# Patient Record
Sex: Female | Born: 1950
Health system: Southern US, Community
[De-identification: ages and names within clinical notes are randomized; demographics above are authoritative.]

## PROBLEM LIST (undated history)

## (undated) DIAGNOSIS — F419 Anxiety disorder, unspecified: Secondary | ICD-10-CM

## (undated) DIAGNOSIS — C4491 Basal cell carcinoma of skin, unspecified: Secondary | ICD-10-CM

## (undated) DIAGNOSIS — D649 Anemia, unspecified: Secondary | ICD-10-CM

## (undated) DIAGNOSIS — C801 Malignant (primary) neoplasm, unspecified: Secondary | ICD-10-CM

## (undated) DIAGNOSIS — J45909 Unspecified asthma, uncomplicated: Secondary | ICD-10-CM

## (undated) DIAGNOSIS — Z9289 Personal history of other medical treatment: Secondary | ICD-10-CM

## (undated) DIAGNOSIS — I719 Aortic aneurysm of unspecified site, without rupture: Secondary | ICD-10-CM

## (undated) HISTORY — DX: Unspecified asthma, uncomplicated: J45.909

## (undated) HISTORY — PX: OTHER SURGICAL HISTORY: SHX169

## (undated) HISTORY — DX: Basal cell carcinoma of skin, unspecified: C44.91

## (undated) HISTORY — DX: Anxiety disorder, unspecified: F41.9

## (undated) HISTORY — PX: TONSILLECTOMY: SUR1361

---

## 2000-09-05 HISTORY — PX: COLON SURGERY: SHX602

## 2001-09-05 HISTORY — PX: COLOSTOMY REVERSAL: SHX5782

## 2015-08-04 ENCOUNTER — Ambulatory Visit (INDEPENDENT_AMBULATORY_CARE_PROVIDER_SITE_OTHER): Payer: Self-pay | Admitting: Family Medicine

## 2015-08-04 ENCOUNTER — Encounter: Payer: Self-pay | Admitting: Family Medicine

## 2015-08-04 VITALS — BP 148/78 | HR 100 | Temp 98.2°F | Resp 18 | Ht 64.0 in | Wt 113.2 lb

## 2015-08-04 DIAGNOSIS — F4321 Adjustment disorder with depressed mood: Secondary | ICD-10-CM | POA: Insufficient documentation

## 2015-08-04 DIAGNOSIS — R002 Palpitations: Secondary | ICD-10-CM

## 2015-08-04 DIAGNOSIS — IMO0001 Reserved for inherently not codable concepts without codable children: Secondary | ICD-10-CM

## 2015-08-04 DIAGNOSIS — F4329 Adjustment disorder with other symptoms: Secondary | ICD-10-CM

## 2015-08-04 DIAGNOSIS — Z8249 Family history of ischemic heart disease and other diseases of the circulatory system: Secondary | ICD-10-CM

## 2015-08-04 DIAGNOSIS — F339 Major depressive disorder, recurrent, unspecified: Secondary | ICD-10-CM | POA: Insufficient documentation

## 2015-08-04 DIAGNOSIS — F321 Major depressive disorder, single episode, moderate: Secondary | ICD-10-CM

## 2015-08-04 DIAGNOSIS — I10 Essential (primary) hypertension: Secondary | ICD-10-CM | POA: Insufficient documentation

## 2015-08-04 DIAGNOSIS — G8929 Other chronic pain: Secondary | ICD-10-CM | POA: Insufficient documentation

## 2015-08-04 DIAGNOSIS — J4540 Moderate persistent asthma, uncomplicated: Secondary | ICD-10-CM | POA: Insufficient documentation

## 2015-08-04 DIAGNOSIS — F410 Panic disorder [episodic paroxysmal anxiety] without agoraphobia: Secondary | ICD-10-CM | POA: Insufficient documentation

## 2015-08-04 DIAGNOSIS — R030 Elevated blood-pressure reading, without diagnosis of hypertension: Secondary | ICD-10-CM | POA: Insufficient documentation

## 2015-08-04 DIAGNOSIS — M546 Pain in thoracic spine: Secondary | ICD-10-CM

## 2015-08-04 DIAGNOSIS — R5383 Other fatigue: Secondary | ICD-10-CM

## 2015-08-04 DIAGNOSIS — Z634 Disappearance and death of family member: Secondary | ICD-10-CM

## 2015-08-04 MED ORDER — CITALOPRAM HYDROBROMIDE 20 MG PO TABS: 20.0000 mg | ORAL_TABLET | Freq: Every day | ORAL | Status: DC

## 2015-08-04 MED ORDER — DIAZEPAM 5 MG PO TABS: 5.0000 mg | ORAL_TABLET | Freq: Two times a day (BID) | ORAL | Status: DC | PRN

## 2015-08-04 MED ORDER — FLUTICASONE FUROATE-VILANTEROL 100-25 MCG/INH IN AEPB: 1.0000 | INHALATION_SPRAY | Freq: Every day | RESPIRATORY_TRACT | Status: DC

## 2015-08-04 NOTE — Progress Notes (Signed)
Name: Patricia Galloway   MRN: [id number]    DOB: Feb 20, 1951   Date:08/04/2015       Progress Note  Subjective  Chief Complaint  Chief Complaint  Patient presents with  . 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 Her father died 5 years ago, mother died 34 months ago. She felt very tired right away with lack of motivation, but worse over the past 2 weeks. She had one very severe episode of palpitation, SOB, sensation of doom. A friend of her gave her a valium and symptoms resolved within 20 minutes. She was about to go to [facility]. She also has been waking up during the night, but able to fall back asleep. She has been napping more than usual. No lack of focus. She feels very fragile and is crying and being very emotional lately.   Chronic Mid back pain: sees chiropractor. Manipulation has improved her back pain. Described as an aching sensation on mid back. Pain at this time is zero/10.  Elevated Blood Pressure: she has not seen a pcp in over 10 years. She has a bp monitor at home and bp was very high during a panic attack - 200/100. However usually around 142/80's.    Asthma Moderate: she has a remote history of asthma, no symptoms in years, also smoked for about 25 years a half pack daily. She is self pay and cannot afford a spirometry or CXR done. She has noticed decreased in exercise tolerance, intermittent wheezing, almost daily, no cough.    Patient Active Problem List   Diagnosis Date Noted  . Chronic thoracic back pain 08/04/2015  . Panic attack 08/04/2015  . Complicated grieving [id number]  . Elevated blood pressure 08/04/2015  . Moderate major depression ([place]) 08/04/2015    Past Surgical History  Procedure Laterality Date  . Etopic  [id number]  . Colon surgery  2002    colostomy for 10 months after a perfurated sigmoid     Family History  Problem Relation Age  of Onset  . Dementia Mother   . Aortic stenosis Mother   . Dementia Father   . Diabetes Father   . Hyperlipidemia Father   . Hypertension Father   . Aneurysm Sister   . Hyperlipidemia Sister   . Hypertension Sister   . Diabetes Sister     Social History   Social History  . Marital Status: Single    Spouse Name: N/A  . Number of Children: N/A  . Years of Education: N/A   Occupational History  . Not on file.   Social History Main Topics  . Smoking status: Former Smoker -- 0.50 packs/day for 25 years    Types: Cigarettes    Quit date: 08/03/1997  . Smokeless tobacco: Not on file  . Alcohol Use: No  . Drug Use: No  . Sexual Activity: Not Currently    Birth Control/ Protection: Post-menopausal   Other Topics Concern  . Not on file   Social History Narrative  . No narrative on file     Current outpatient prescriptions:  .  Ascorbic Acid (VITAMIN C) 100 MG tablet, Take 100 mg by mouth daily., Disp: , Rfl:  .  co-enzyme Q-10 30 MG capsule, Take 30 mg by mouth 3 (three) times daily., Disp: , Rfl:  .  Multiple Vitamin (MULTIVITAMIN) capsule, Take 1 capsule by mouth daily.,  Disp: , Rfl:  .  vitamin E 100 UNIT capsule, Take by mouth daily., Disp: , Rfl:   Allergies  Allergen Reactions  . Codeine   . Penicillins      ROS  Constitutional: Negative for fever or weight change.  Respiratory: Negative for cough , but has  shortness of breath during a panic attack.   Cardiovascular: Positive  for chest pain or palpitations during panic attacks.  Gastrointestinal: Negative for abdominal pain, no bowel changes.  Musculoskeletal: Negative for gait problem or joint swelling.  Skin: Negative for rash.  Neurological: Negative for dizziness or headache.  No other specific complaints in a complete review of systems (except as listed in HPI above).  Objective  Filed Vitals:   08/04/15 0811  BP: 148/78  Pulse: 100  Temp: 98.2 F (36.8 C)  Resp: 18  Height: 5\' 4"  (1.626  m)  Weight: 113 lb 4 oz (51.37 kg)  SpO2: 97%    Body mass index is 19.43 kg/(m^2).  Physical Exam  Constitutional: Patient appears well-developed and well-nourished. No distress.  HENT: Head: Normocephalic and atraumatic. Ears: B TMs ok, no erythema or effusion; Nose: Nose normal. Mouth/Throat: Oropharynx is clear and moist. No oropharyngeal exudate.  Eyes: Conjunctivae and EOM are normal. Pupils are equal, round, and reactive to light. No scleral icterus.  Neck: Normal range of motion. Neck supple. No JVD present. No thyromegaly present.  Cardiovascular: Normal rate, regular rhythm and normal heart sounds.  No murmur heard. No BLE edema. Pulmonary/Chest: Effort normal , end expiratory wheezing. No respiratory distress. Abdominal: Soft. Bowel sounds are normal, no distension. There is no tenderness. no masses Musculoskeletal: Normal range of motion, no joint effusions. No gross deformities Neurological: he is alert and oriented to person, place, and time. No cranial nerve deficit. Coordination, balance, strength, speech and gait are normal.  Skin: Skin is warm and dry. No rash noted. No erythema.  Psychiatric: Patient is sad, crying. behavior is normal. Judgment and thought content normal.  PHQ2/9: Depression screen PHQ 2/9 08/04/2015  Decreased Interest 0  Down, Depressed, Hopeless 0  PHQ - 2 Score 0     Functional Status Survey: Is the patient deaf or have difficulty hearing?: No Does the patient have difficulty seeing, even when wearing glasses/contacts?: No Does the patient have difficulty concentrating, remembering, or making decisions?: No Does the patient have difficulty walking or climbing stairs?: No Does the patient have difficulty dressing or bathing?: No Does the patient have difficulty doing errands alone such as visiting a doctor's office or shopping?: No \   Assessment & Plan  1. Palpitation  Likely from panic attack, she just had a CBC and will bring me the  result - TSH  2. Chronic midline thoracic back pain  Continue follow up with Dr. Freddi Che  3. Complicated grieving  Discussed importance of contacting Hospice for grieving care.   4. Other fatigue  - Comprehensive metabolic panel - VITAMIN D 25 Hydroxy (Vit-D Deficiency, Fractures) - Vitamin B12  5. Family history of heart disease  - Lipid panel  6. Panic attack  - diazepam (VALIUM) 5 MG tablet; Take 1 tablet (5 mg total) by mouth every 12 (twelve) hours as needed for anxiety.  Dispense: 30 tablet; Refill: 0 - citalopram (CELEXA) 20 MG tablet; Take 1 tablet (20 mg total) by mouth daily.  Dispense: 30 tablet; Refill: 0  7. Elevated blood pressure  Monitor bp at home - and bring the results to her next visit  9. Asthma, moderate persistent, poorly-controlled  - Fluticasone Furoate-Vilanterol (BREO ELLIPTA) 100-25 MCG/INH AEPB; Inhale 1 puff into the lungs daily.  Dispense: 60 each; Refill: 0 We will try Breo and monitor, follow up in 2 weeks

## 2015-08-18 LAB — BASIC METABOLIC PANEL
BUN: 17 mg/dL (ref 4–21)
GLUCOSE: 88 mg/dL
POTASSIUM: 4.4 mmol/L (ref 3.4–5.3)
Sodium: 134 mmol/L — AB (ref 137–147)

## 2015-08-18 LAB — LIPID PANEL
Cholesterol: 163 mg/dL (ref 0–200)
HDL: 75 mg/dL — AB (ref 35–70)
LDL Cholesterol: 81 mg/dL
Triglycerides: 34 mg/dL — AB (ref 40–160)

## 2015-08-18 LAB — TSH: TSH: 2.91 u[IU]/mL (ref 0.41–5.90)

## 2015-08-18 LAB — HEMOGLOBIN A1C: HEMOGLOBIN A1C: 5.9

## 2015-08-25 ENCOUNTER — Ambulatory Visit: Payer: Self-pay | Admitting: Family Medicine

## 2015-08-25 ENCOUNTER — Encounter: Payer: Self-pay | Admitting: Family Medicine

## 2015-08-25 ENCOUNTER — Ambulatory Visit (INDEPENDENT_AMBULATORY_CARE_PROVIDER_SITE_OTHER): Payer: Self-pay | Admitting: Family Medicine

## 2015-08-25 VITALS — BP 148/96 | HR 106 | Temp 98.2°F | Resp 18 | Ht 64.0 in | Wt 117.3 lb

## 2015-08-25 DIAGNOSIS — R2240 Localized swelling, mass and lump, unspecified lower limb: Secondary | ICD-10-CM | POA: Insufficient documentation

## 2015-08-25 DIAGNOSIS — R2241 Localized swelling, mass and lump, right lower limb: Secondary | ICD-10-CM

## 2015-08-25 DIAGNOSIS — J4540 Moderate persistent asthma, uncomplicated: Secondary | ICD-10-CM

## 2015-08-25 DIAGNOSIS — D509 Iron deficiency anemia, unspecified: Secondary | ICD-10-CM

## 2015-08-25 DIAGNOSIS — F321 Major depressive disorder, single episode, moderate: Secondary | ICD-10-CM

## 2015-08-25 DIAGNOSIS — F410 Panic disorder [episodic paroxysmal anxiety] without agoraphobia: Secondary | ICD-10-CM

## 2015-08-25 MED ORDER — SERTRALINE HCL 50 MG PO TABS: 25.0000 mg | ORAL_TABLET | Freq: Every day | ORAL | Status: DC

## 2015-08-25 MED ORDER — FERROUS GLUCONATE 324 (38 FE) MG PO TABS: 324.0000 mg | ORAL_TABLET | Freq: Three times a day (TID) | ORAL | Status: DC

## 2015-08-25 MED ORDER — FLUTICASONE FUROATE-VILANTEROL 100-25 MCG/INH IN AEPB: 1.0000 | INHALATION_SPRAY | Freq: Every day | RESPIRATORY_TRACT | Status: DC

## 2015-08-25 MED ORDER — DIAZEPAM 5 MG PO TABS: 5.0000 mg | ORAL_TABLET | Freq: Two times a day (BID) | ORAL | Status: DC | PRN

## 2015-08-25 MED ORDER — SILVER SULFADIAZINE 1 % EX CREA: 1.0000 "application " | TOPICAL_CREAM | Freq: Every day | CUTANEOUS | Status: DC

## 2015-08-25 NOTE — Progress Notes (Signed)
Name: Patricia Galloway   MRN: [id number]    DOB: [birth year]   Date:08/25/2015       Progress Note  Subjective  Chief Complaint  Chief Complaint  Patient presents with  . Medication Refill    3 week F/U  . Panic Attacks    Has had less attacks with Valium, only took 1 of her Celexa-had bad side effects such as making her feel heavy and very drugged  . Asthma    Breo has improved breathing quality    HPI   Asthma: she is feeling better since she started taking Breo, still has SOB, but no longer has wheezing, cough also very seldom now.   Panic Attacks and Depression: unable to tolerate Citalopram , made her feel groggy. She states Valium is working well, no recent panic attacks, able to take half twice daily, she has also been able to sleep well since started on medication  Elevated blood pressure: bp elevated again, and she likely has hypertension, however wants to hold off on medication, she was really nervous about discussing a mass that has been on her leg for 8 years .  Anemia: she was able to have labs done and was found to have severe anemia - hgb down from 6 a few weeks ago to 5.4. She is tired and SOB but states present for years. She denies chest pain or syncopal episodes. She denies blood in stools, she states mass on her legs bleeds frequently and sometimes in large amounts  Leg mass: present for the past 8 years, initially a quarter size area. She has kept the area hidden from even her husband for all this time. Very ashamed of it, but did not want to discuss it since she does not have medical insurance. She keep the area clean, different products that she uses about twice daily and may include peroxide, witch hazel or tea tree oil. The mass has grown over the years, and it has a strong odor.    Patient Active Problem List   Diagnosis Date Noted  . Iron deficiency anemia 08/25/2015  . Mass of leg 08/25/2015  . Chronic thoracic back pain 08/04/2015  . Panic attack  08/04/2015  . Complicated grieving [id number]  . Elevated blood pressure 08/04/2015  . Moderate major depression ([place]) 08/04/2015  . Asthma, moderate persistent, poorly-controlled 08/04/2015    Past Surgical History  Procedure Laterality Date  . Etopic  [id number]  . Colon surgery  2002    colostomy for 10 months after a perfurated sigmoid     Family History  Problem Relation Age of Onset  . Dementia Mother   . Aortic stenosis Mother   . Osteoporosis Mother   . Dementia Father   . Diabetes Father   . Hyperlipidemia Father   . Hypertension Father   . Aneurysm Sister   . Hyperlipidemia Sister   . Hypertension Sister   . Diabetes Sister     Social History   Social History  . Marital Status: Single    Spouse Name: N/A  . Number of Children: N/A  . Years of Education: N/A   Occupational History  . Not on file.   Social History Main Topics  . Smoking status: Former Smoker -- 0.50 packs/day for 25 years    Types: Cigarettes    Quit date: 08/03/1997  . Smokeless tobacco: Not on file  . Alcohol Use: No  . Drug Use: No  . Sexual Activity: Not Currently    Birth Control/  Protection: Post-menopausal   Other Topics Concern  . Not on file   Social History Narrative     Current outpatient prescriptions:  .  Ascorbic Acid (VITAMIN C) 100 MG tablet, Take 100 mg by mouth daily., Disp: , Rfl:  .  co-enzyme Q-10 30 MG capsule, Take 30 mg by mouth 3 (three) times daily., Disp: , Rfl:  .  diazepam (VALIUM) 5 MG tablet, Take 1 tablet (5 mg total) by mouth every 12 (twelve) hours as needed for anxiety., Disp: 30 tablet, Rfl: 2 .  Fluticasone Furoate-Vilanterol (BREO ELLIPTA) 100-25 MCG/INH AEPB, Inhale 1 puff into the lungs daily., Disp: 60 each, Rfl: 2 .  Multiple Vitamin (MULTIVITAMIN) capsule, Take 1 capsule by mouth daily., Disp: , Rfl:  .  vitamin E 100 UNIT capsule, Take by mouth daily., Disp: , Rfl:  .  sertraline (ZOLOFT) 50 MG tablet, Take 0.5-1 tablets (25-50 mg  total) by mouth daily., Disp: 30 tablet, Rfl: 3  Allergies  Allergen Reactions  . Codeine   . Penicillins      ROS  Constitutional: Negative for fever or weight change.  Respiratory: positive  for cough and shortness of breath.   Cardiovascular: Negative for chest pain or palpitations.  Gastrointestinal: Negative for abdominal pain, no bowel changes.  Musculoskeletal: Negative for gait problem or joint swelling.  Skin: large mass on right inner thigh  Neurological: Negative for dizziness or headache.  No other specific complaints in a complete review of systems (except as listed in HPI above).  Objective  Filed Vitals:   08/25/15 1334  BP: 148/96  Pulse: 106  Temp: 98.2 F (36.8 C)  TempSrc: Oral  Resp: 18  Height: 5\' 4"  (1.626 m)  Weight: 117 lb 4.8 oz (53.207 kg)  SpO2: 96%    Body mass index is 20.12 kg/(m^2).  Physical Exam  Constitutional: Patient appears well-developed and well-nourished.  No distress.  HEENT: head atraumatic, normocephalic, pupils equal and reactive to light, neck supple, throat within normal limits Cardiovascular: Normal rate, regular rhythm and normal heart sounds.  No murmur heard. No BLE edema. Pulmonary/Chest: Effort normal and breath sounds normal. No respiratory distress. Abdominal: Soft.  There is no tenderness. Psychiatric: Patient has a normal mood and affect. behavior is normal. Judgment and thought content normal. Skin: large mass on right inner thigh , with strong odor, friable, no active bleeding, spoke to Dr. Fleet Contras - general surgeon and he contacted social worker we will try to arrange for therapy  Recent Results (from the past 2160 hour(s))  Basic metabolic panel     Status: Abnormal   Collection Time: 08/18/15 12:00 AM  Result Value Ref Range   Glucose 88 mg/dL   BUN 17 4 - 21 mg/dL   Potassium 4.4 3.4 - 5.3 mmol/L   Sodium 134 (A) 137 - 147 mmol/L  Lipid panel     Status: Abnormal   Collection Time: 08/18/15 12:00 AM   Result Value Ref Range   Triglycerides 34 (A) 40 - 160 mg/dL   Cholesterol 163 0 - 200 mg/dL   HDL 75 (A) 35 - 70 mg/dL   LDL Cholesterol 81 mg/dL  Hemoglobin A1c     Status: None   Collection Time: 08/18/15 12:00 AM  Result Value Ref Range   Hemoglobin A1C 5.9   TSH     Status: None   Collection Time: 08/18/15 12:00 AM  Result Value Ref Range   TSH 2.91 0.41 - 5.90 uIU/mL  PHQ2/9: Depression screen PHQ 2/9 08/04/2015  Decreased Interest 0  Down, Depressed, Hopeless 0  PHQ - 2 Score 0     Assessment & Plan  1. Iron deficiency anemia  - ferrous gluconate (FERGON) 324 MG tablet; Take 1 tablet (324 mg total) by mouth 3 (three) times daily with meals.  Dispense: 90 tablet; Refill: 0 Very low hgb level - started on otc ferrous supplementation  Likely chronic, she was not in distress but has tachycardia, discussed sending her to [facility] for blood transfusion, but she is worried about the cost, we will start supplements, advised to go to [facility] if no improvement. Dr. Fleet Contras contacted Brayton Layman at [facility] - social worker to see how we can help the patient.   2. Mass of leg, right  present for the past 8 years, needs to have it surgically removed - likely skin cancer - but she does not have insurance. Dr. Fleet Contras was kind to come to [facility] and look at the lesion and discuss treatment with patient. We are waiting on social worker to contact patient and tell us how to proceed from here.  - silver sulfADIAZINE (SILVADENE) 1 % cream; Apply 1 application topically daily.  Dispense: 50 g; Refill: 2  3. Asthma, moderate persistent, poorly-controlled  - Fluticasone Furoate-Vilanterol (BREO ELLIPTA) 100-25 MCG/INH AEPB; Inhale 1 puff into the lungs daily.  Dispense: 60 each; Refill: 2  4. Panic attack  - diazepam (VALIUM) 5 MG tablet; Take 1 tablet (5 mg total) by mouth every 12 (twelve) hours as needed for anxiety.  Dispense: 30 tablet; Refill: 2  5. Moderate major depression ([place])  She  could not tolerate Celexa, we will try Zoloft , start low and titrate, she will call back in one month, and if she is doing well she will follow up in 3 months - since she does not have insurance at this time - sertraline (ZOLOFT) 50 MG tablet; Take 0.5-1 tablets (25-50 mg total) by mouth daily.  Dispense: 30 tablet; Refill: 3

## 2015-08-26 ENCOUNTER — Ambulatory Visit: Payer: Self-pay | Admitting: Family Medicine

## 2015-08-27 ENCOUNTER — Encounter: Payer: Self-pay | Admitting: Family Medicine

## 2015-09-02 ENCOUNTER — Telehealth: Payer: Self-pay

## 2015-09-02 NOTE — Telephone Encounter (Signed)
Called patient and left voicemail to see how she was doing and if social services had contacted her yet, from her appointment on 12/20.

## 2015-09-02 NOTE — Telephone Encounter (Signed)
Patient called back states Social Services did called and asked her several questions and told her they would give her a call back. She has not heard back from them as of yet. She states she is taking several supplements for her anemia and over the past couple of days and has made her feel good and have more energy.

## 2015-09-02 NOTE — Telephone Encounter (Signed)
I am really concerned about her anemia, please advise her to go to [facility] if Education officer, museum can't give hear time frame about her benefits. Thank you

## 2015-09-03 NOTE — Telephone Encounter (Signed)
Dr. Ancil Boozer spoke with patient today on 09/03/2015 at 11:00 a.m. To express her concerns about her anemia and to follow back with Social Services for about getting help.

## 2015-09-22 ENCOUNTER — Telehealth: Payer: Self-pay

## 2015-09-22 ENCOUNTER — Ambulatory Visit: Payer: Self-pay | Admitting: General Surgery

## 2015-09-22 DIAGNOSIS — D509 Iron deficiency anemia, unspecified: Secondary | ICD-10-CM

## 2015-09-22 NOTE — Telephone Encounter (Signed)
Patient was informed that she will have to wait 6 weeks (11/03/15) to see the difference in her ferritin/ iron levels. She agreed and said thank you so much.

## 2015-09-22 NOTE — Telephone Encounter (Signed)
She needs to wait at least 6 weeks since last checked

## 2015-09-22 NOTE — Telephone Encounter (Signed)
Patient wants an order for labs regarding iron levels as mentioned during office visit.   Please call when ready.

## 2015-09-25 ENCOUNTER — Ambulatory Visit: Payer: Self-pay | Admitting: Family Medicine

## 2015-10-16 ENCOUNTER — Ambulatory Visit: Payer: Self-pay | Admitting: Family Medicine

## 2015-10-22 ENCOUNTER — Telehealth: Payer: Self-pay | Admitting: *Deleted

## 2015-10-22 NOTE — Telephone Encounter (Signed)
Message left for patient to call the office.

## 2015-10-22 NOTE — Telephone Encounter (Signed)
-----   Message from Robert Bellow, MD sent at 10/22/2015  9:34 AM EST ----- Please contact the patient and see if she is willing to have a biopsy completed on the right leg mass.

## 2015-10-26 ENCOUNTER — Other Ambulatory Visit: Payer: Self-pay | Admitting: Family Medicine

## 2015-10-26 NOTE — Telephone Encounter (Signed)
Another message left for patient to call the office.

## 2015-10-29 ENCOUNTER — Encounter: Payer: Self-pay | Admitting: *Deleted

## 2015-10-29 NOTE — Telephone Encounter (Signed)
My Chart message sent to the patient today

## 2015-11-02 ENCOUNTER — Telehealth: Payer: Self-pay | Admitting: Family Medicine

## 2015-11-02 NOTE — Telephone Encounter (Signed)
Patient willing to come in for excision but she wishes to postpone until April 2017.  An appointment has been arranged per patient's wishes.

## 2015-11-02 NOTE — Telephone Encounter (Signed)
Have appointment with Dr Ancil Boozer on next week and is requesting a lab order.

## 2015-11-02 NOTE — Telephone Encounter (Signed)
Please reprint orders from January

## 2015-11-05 ENCOUNTER — Other Ambulatory Visit: Payer: Self-pay | Admitting: Family Medicine

## 2015-11-06 LAB — CBC WITH DIFFERENTIAL/PLATELET
BASOS ABS: 0.1 10*3/uL (ref 0.0–0.2)
BASOS: 1 %
EOS (ABSOLUTE): 0.4 10*3/uL (ref 0.0–0.4)
EOS: 4 %
Hematocrit: 38.3 % (ref 34.0–46.6)
Hemoglobin: 13 g/dL (ref 11.1–15.9)
IMMATURE GRANS (ABS): 0 10*3/uL (ref 0.0–0.1)
Immature Granulocytes: 0 %
LYMPHS: 24 %
Lymphocytes Absolute: 2.3 10*3/uL (ref 0.7–3.1)
MCH: 26.5 pg — AB (ref 26.6–33.0)
MCHC: 33.9 g/dL (ref 31.5–35.7)
MCV: 78 fL — ABNORMAL LOW (ref 79–97)
MONOCYTES: 10 %
Monocytes Absolute: 1 10*3/uL — ABNORMAL HIGH (ref 0.1–0.9)
NEUTROS ABS: 6 10*3/uL (ref 1.4–7.0)
NEUTROS PCT: 61 %
PLATELETS: 369 10*3/uL (ref 150–379)
RBC: 4.9 x10E6/uL (ref 3.77–5.28)
RDW: 20.2 % — ABNORMAL HIGH (ref 12.3–15.4)
WBC: 9.7 10*3/uL (ref 3.4–10.8)

## 2015-11-06 LAB — IRON AND TIBC
IRON SATURATION: 37 % (ref 15–55)
Iron: 112 ug/dL (ref 27–139)
TIBC: 305 ug/dL (ref 250–450)
UIBC: 193 ug/dL (ref 118–369)

## 2015-11-06 LAB — FERRITIN: Ferritin: 24 ng/mL (ref 15–150)

## 2015-11-10 ENCOUNTER — Encounter: Payer: Self-pay | Admitting: Family Medicine

## 2015-11-10 ENCOUNTER — Ambulatory Visit (INDEPENDENT_AMBULATORY_CARE_PROVIDER_SITE_OTHER): Payer: Self-pay | Admitting: Family Medicine

## 2015-11-10 VITALS — BP 142/82 | HR 104 | Temp 99.1°F | Resp 16 | Wt 114.4 lb

## 2015-11-10 DIAGNOSIS — F4321 Adjustment disorder with depressed mood: Secondary | ICD-10-CM

## 2015-11-10 DIAGNOSIS — D509 Iron deficiency anemia, unspecified: Secondary | ICD-10-CM

## 2015-11-10 DIAGNOSIS — Z634 Disappearance and death of family member: Secondary | ICD-10-CM

## 2015-11-10 DIAGNOSIS — R2241 Localized swelling, mass and lump, right lower limb: Secondary | ICD-10-CM

## 2015-11-10 DIAGNOSIS — F4329 Adjustment disorder with other symptoms: Secondary | ICD-10-CM

## 2015-11-10 DIAGNOSIS — J4540 Moderate persistent asthma, uncomplicated: Secondary | ICD-10-CM

## 2015-11-10 DIAGNOSIS — F321 Major depressive disorder, single episode, moderate: Secondary | ICD-10-CM

## 2015-11-10 MED ORDER — ALBUTEROL SULFATE HFA 108 (90 BASE) MCG/ACT IN AERS: 2.0000 | INHALATION_SPRAY | Freq: Four times a day (QID) | RESPIRATORY_TRACT | Status: DC | PRN

## 2015-11-10 NOTE — Progress Notes (Signed)
Name: Patricia Galloway   MRN: [id number]    DOB: 1951-04-14   Date:11/10/2015       Progress Note  Subjective  Chief Complaint  Chief Complaint  Patient presents with  . Anemia    patient is here for her 1-month f/u    HPI  Asthma: she is feeling better since she started taking Breo. She has been paying a lot of money for it, we will try to get her samples until she gets insurance ( Medicare ) in Nov 2017  Panic Attacks and Depression: unable to tolerate Citalopram , made her feel groggy, she never started Zoloft. She states Valium is working well, no recent panic attacks, able to take half tablet prn only, about half pill once a week, she has also been able to sleep well since started on medication.  HTN: bp elevated again, but she does not want to take medication for hypertension. BP is not dangerously high so we will monitor.   Anemia: she was able to have labs done and was found to have severe anemia - hgb down from 6 a few weeks ago to 5.4. She is feeling better, less fatigue, but still her moments. Hbg level is back to normal, she is taking iron pills and increased iron rich diet.   Leg mass: present for the past 8 years, initially a quarter size area. She has kept the area hidden from even her husband for all this time. Very ashamed of it, but did not want to discuss it since she does not have medical insurance. She keep the area clean, different products that she uses about twice daily and may include peroxide, witch hazel or tea tree oil. The mass has grown over the years, and it has a strong odor. She tried silvadene but it caused increase in irritation of her skin and bleeding so she stopped after 5 weeks and is doing better now. She will have Medicare this fall and will wait to have biopsy done at that time.    Patient Active Problem List   Diagnosis Date Noted  . Iron deficiency anemia 08/25/2015  . Mass of leg 08/25/2015  . Chronic thoracic back pain 08/04/2015  . Panic  attack 08/04/2015  . Complicated grieving [id number]  . Elevated blood pressure 08/04/2015  . Moderate major depression ([place]) 08/04/2015  . Asthma, moderate persistent, poorly-controlled 08/04/2015    Past Surgical History  Procedure Laterality Date  . Etopic  [id number]  . Colon surgery  2002    colostomy for 10 months after a perfurated sigmoid     Family History  Problem Relation Age of Onset  . Dementia Mother   . Aortic stenosis Mother   . Osteoporosis Mother   . Dementia Father   . Diabetes Father   . Hyperlipidemia Father   . Hypertension Father   . Aneurysm Sister   . Hyperlipidemia Sister   . Hypertension Sister   . Diabetes Sister     Social History   Social History  . Marital Status: Single    Spouse Name: N/A  . Number of Children: N/A  . Years of Education: N/A   Occupational History  . Not on file.   Social History Main Topics  . Smoking status: Former Smoker -- 0.50 packs/day for 25 years    Types: Cigarettes    Quit date: 08/03/1997  . Smokeless tobacco: Not on file  . Alcohol Use: No  . Drug Use: No  . Sexual Activity: Not Currently  Birth Control/ Protection: Post-menopausal   Other Topics Concern  . Not on file   Social History Narrative     Current outpatient prescriptions:  .  Ascorbic Acid (VITAMIN C) 100 MG tablet, Take 100 mg by mouth daily., Disp: , Rfl:  .  Cholecalciferol (VITAMIN D3) 1000 units CAPS, Take by mouth., Disp: , Rfl:  .  co-enzyme Q-10 30 MG capsule, Take 30 mg by mouth 3 (three) times daily., Disp: , Rfl:  .  diazepam (VALIUM) 5 MG tablet, TAKE ONE TABLET EVERY 12 HOURS IF NEEDED FOR ANXIETY, Disp: 30 tablet, Rfl: 0 .  ferrous gluconate (FERGON) 324 MG tablet, Take 1 tablet (324 mg total) by mouth 3 (three) times daily with meals., Disp: 90 tablet, Rfl: 0 .  Fluticasone Furoate-Vilanterol (BREO ELLIPTA) 100-25 MCG/INH AEPB, Inhale 1 puff into the lungs daily., Disp: 60 each, Rfl: 2 .  Multiple Vitamin  (MULTIVITAMIN) capsule, Take 1 capsule by mouth daily., Disp: , Rfl:  .  vitamin E 100 UNIT capsule, Take by mouth daily., Disp: , Rfl:   Allergies  Allergen Reactions  . Codeine   . Penicillins   . Sulfur Other (See Comments)     Patient stated redness, discomfort, and made skin bleed     ROS  Constitutional: Negative for fever , mild  weight change.  Respiratory: positive for cough and shortness of breath - better with Breo.  Cardiovascular: Negative for chest pain or palpitations.  Gastrointestinal: Negative for abdominal pain, no bowel changes.  Musculoskeletal: Negative for gait problem or joint swelling.  Skin: large mass on right inner thigh  Neurological: Negative for dizziness or headache.  No other specific complaints in a complete review of systems (except as listed in HPI above).  Objective  Filed Vitals:   11/10/15 1339  BP: 142/82  Pulse: 104  Temp: 99.1 F (37.3 C)  TempSrc: Oral  Resp: 16  Weight: 114 lb 6.4 oz (51.891 kg)  SpO2: 96%    Body mass index is 19.63 kg/(m^2).  Physical Exam   Constitutional: Patient appears well-developed and well-nourished. No distress.  HEENT: head atraumatic, normocephalic, pupils equal and reactive to light, neck supple, throat within normal limits Cardiovascular: Normal rate, regular rhythm and normal heart sounds. No murmur heard. No BLE edema. Pulmonary/Chest: Effort normal and breath sounds normal. No respiratory distress. Abdominal: Soft. There is no tenderness. Psychiatric: Patient has a normal mood and affect. behavior is normal. Judgment and thought content normal. Skin:not examined today   Recent Results (from the past 2160 hour(s))  Basic metabolic panel     Status: Abnormal   Collection Time: 08/18/15 12:00 AM  Result Value Ref Range   Glucose 88 mg/dL   BUN 17 4 - 21 mg/dL   Potassium 4.4 3.4 - 5.3 mmol/L   Sodium 134 (A) 137 - 147 mmol/L  Lipid panel     Status: Abnormal   Collection Time:  08/18/15 12:00 AM  Result Value Ref Range   Triglycerides 34 (A) 40 - 160 mg/dL   Cholesterol 163 0 - 200 mg/dL   HDL 75 (A) 35 - 70 mg/dL   LDL Cholesterol 81 mg/dL  Hemoglobin A1c     Status: None   Collection Time: 08/18/15 12:00 AM  Result Value Ref Range   Hemoglobin A1C 5.9   TSH     Status: None   Collection Time: 08/18/15 12:00 AM  Result Value Ref Range   TSH 2.91 0.41 - 5.90 uIU/mL  CBC with  Differential/Platelet     Status: Abnormal   Collection Time: 11/05/15  9:23 AM  Result Value Ref Range   WBC 9.7 3.4 - 10.8 x10E3/uL   RBC 4.90 3.77 - 5.28 x10E6/uL   Hemoglobin 13.0 11.1 - 15.9 g/dL   Hematocrit 38.3 34.0 - 46.6 %   MCV 78 (L) 79 - 97 fL   MCH 26.5 (L) 26.6 - 33.0 pg   MCHC 33.9 31.5 - 35.7 g/dL   RDW 20.2 (H) 12.3 - 15.4 %   Platelets 369 150 - 379 x10E3/uL   Neutrophils 61 %   Lymphs 24 %   Monocytes 10 %   Eos 4 %   Basos 1 %   Neutrophils Absolute 6.0 1.4 - 7.0 x10E3/uL   Lymphocytes Absolute 2.3 0.7 - 3.1 x10E3/uL   Monocytes Absolute 1.0 (H) 0.1 - 0.9 x10E3/uL   EOS (ABSOLUTE) 0.4 0.0 - 0.4 x10E3/uL   Basophils Absolute 0.1 0.0 - 0.2 x10E3/uL   Immature Granulocytes 0 %   Immature Grans (Abs) 0.0 0.0 - 0.1 x10E3/uL  Iron and TIBC     Status: None   Collection Time: 11/05/15  9:23 AM  Result Value Ref Range   Total Iron Binding Capacity 305 250 - 450 ug/dL   UIBC 193 118 - 369 ug/dL   Iron 112 27 - 139 ug/dL   Iron Saturation 37 15 - 55 %  Ferritin     Status: None   Collection Time: 11/05/15  9:23 AM  Result Value Ref Range   Ferritin 24 15 - 150 ng/mL     PHQ2/9: Depression screen [facility] 2/9 11/10/2015 08/04/2015  Decreased Interest 0 0  Down, Depressed, Hopeless 0 0  PHQ - 2 Score 0 0    Fall Risk: Fall Risk  11/10/2015  Falls in the past year? No     Functional Status Survey: Is the patient deaf or have difficulty hearing?: No Does the patient have difficulty seeing, even when wearing glasses/contacts?: No Does the patient have  difficulty concentrating, remembering, or making decisions?: No Does the patient have difficulty walking or climbing stairs?: No Does the patient have difficulty dressing or bathing?: No Does the patient have difficulty doing errands alone such as visiting a doctor's office or shopping?: No    Assessment & Plan  1. Asthma, moderate persistent, poorly-controlled  We will try Breo Elipta samples until she gets insurance coverage this Fall - albuterol (PROVENTIL HFA;VENTOLIN HFA) 108 (90 Base) MCG/ACT inhaler; Inhale 2 puffs into the lungs every 6 (six) hours as needed for wheezing or shortness of breath.  Dispense: 1 Inhaler; Refill: 1  2. Mass of leg, right  She will hold off on seeing Dr. Fleet Contras for now. Waiting on insurance coverage  3. Iron deficiency anemia  Doing great, continue high iron rich diet  4. Moderate major depression ([facility])  Doing much better now, never filled Zoloft  5. Complicated grieving  Had counseling with hospice grieving group and is doing well now

## 2015-12-10 ENCOUNTER — Ambulatory Visit: Payer: Self-pay | Admitting: General Surgery

## 2016-02-23 ENCOUNTER — Ambulatory Visit: Payer: Self-pay | Admitting: General Surgery

## 2016-03-01 ENCOUNTER — Encounter: Payer: Self-pay | Admitting: *Deleted

## 2016-03-09 ENCOUNTER — Ambulatory Visit (INDEPENDENT_AMBULATORY_CARE_PROVIDER_SITE_OTHER): Payer: Self-pay | Admitting: Family Medicine

## 2016-03-09 ENCOUNTER — Encounter: Payer: Self-pay | Admitting: Family Medicine

## 2016-03-09 VITALS — BP 174/88 | HR 118 | Temp 98.3°F | Resp 20 | Ht 64.0 in | Wt 110.7 lb

## 2016-03-09 DIAGNOSIS — F410 Panic disorder [episodic paroxysmal anxiety] without agoraphobia: Secondary | ICD-10-CM

## 2016-03-09 DIAGNOSIS — IMO0001 Reserved for inherently not codable concepts without codable children: Secondary | ICD-10-CM

## 2016-03-09 DIAGNOSIS — D509 Iron deficiency anemia, unspecified: Secondary | ICD-10-CM

## 2016-03-09 DIAGNOSIS — R2241 Localized swelling, mass and lump, right lower limb: Secondary | ICD-10-CM

## 2016-03-09 DIAGNOSIS — F321 Major depressive disorder, single episode, moderate: Secondary | ICD-10-CM

## 2016-03-09 DIAGNOSIS — J4540 Moderate persistent asthma, uncomplicated: Secondary | ICD-10-CM

## 2016-03-09 DIAGNOSIS — R030 Elevated blood-pressure reading, without diagnosis of hypertension: Secondary | ICD-10-CM

## 2016-03-09 MED ORDER — DIAZEPAM 5 MG PO TABS: 5.0000 mg | ORAL_TABLET | Freq: Two times a day (BID) | ORAL | Status: DC | PRN

## 2016-03-09 NOTE — Progress Notes (Signed)
Name: Patricia Galloway   MRN: [id number]    DOB: Oct 19, 1950   Date:03/09/2016       Progress Note  Subjective  Chief Complaint  Chief Complaint  Patient presents with  . Follow-up    patient had several health maintenance items to be addressed  . Asthma    patient stated that the [facility] has worked, but it is unaffordable. needs more samples.  . Anemia  . Depression    patient had been ok  . Mass  . Fatigue    patient stated that she has had increased episodes of fatigue    HPI  Asthma: she is feeling better since she started taking Breo. She has been paying a lot of money for it, we will try to get her samples until she gets insurance ( Medicare ) in Nov 2017, she states she has a lot of wheezing and SOB when off medication for 3 days.   Panic Attacks and Depression: unable to tolerate Citalopram , made her feel groggy, she never started Zoloft. She states Valium is working well, no recent panic attacks, able to take half tablet prn only, about half pill once a week, she has also been able to sleep well since started on medication.   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 Her father died 5 years ago, mother died in 2014-12-22. She is doing better, more motivation now.  Not able to tolerate SSRI.   HTN: bp elevated again, but she does not want to take medication for hypertension. Explained risk, she thinks it is white coat   Anemia: she was able to have labs done and was found to have severe anemia - hgb down from 6 a few weeks ago to 5.4, but improved with supplements, she states leg mass has been bleeding again and will resume her supplements. She is feeling very tired again when she gets up in am's  Leg mass: present for the past 9 years, initially a quarter size area. She has kept the area hidden from even her husband for all this time. Very ashamed of it, but did not want to discuss it since she does not have medical  insurance. She keep the area clean, different products that she uses about twice daily and may include peroxide, witch hazel or tea tree oil. The mass has grown over the years, and it has a strong odor. She tried silvadene but it caused increase in irritation of her skin and bleeding so she stopped after 5 weeks and is doing better now. She will have Medicare this fall and will wait to have biopsy done at that time. She was seen by Dr. Fleet Contras, concerned about cancer  Patient Active Problem List   Diagnosis Date Noted  . Iron deficiency anemia 08/25/2015  . Mass of leg 08/25/2015  . Chronic thoracic back pain 08/04/2015  . Panic attack 08/04/2015  . Complicated grieving [id number]  . Elevated blood pressure 08/04/2015  . Moderate major depression ([place]) 08/04/2015  . Asthma, moderate persistent, poorly-controlled 08/04/2015    Past Surgical History  Procedure Laterality Date  . Etopic  [id number]  . Colon surgery  12/21/2000    colostomy for 10 months after a perfurated sigmoid     Family History  Problem Relation Age of Onset  . Dementia Mother   . Aortic stenosis Mother   . Osteoporosis Mother   . Dementia Father   . Diabetes Father   .  Hyperlipidemia Father   . Hypertension Father   . Aneurysm Sister   . Hyperlipidemia Sister   . Hypertension Sister   . Diabetes Sister     Social History   Social History  . Marital Status: Single    Spouse Name: N/A  . Number of Children: N/A  . Years of Education: N/A   Occupational History  . Not on file.   Social History Main Topics  . Smoking status: Former Smoker -- 0.50 packs/day for 25 years    Types: Cigarettes    Quit date: 08/03/1997  . Smokeless tobacco: Not on file  . Alcohol Use: No  . Drug Use: No  . Sexual Activity: Not Currently    Birth Control/ Protection: Post-menopausal   Other Topics Concern  . Not on file   Social History Narrative     Current outpatient prescriptions:  .  albuterol (PROVENTIL  HFA;VENTOLIN HFA) 108 (90 Base) MCG/ACT inhaler, Inhale 2 puffs into the lungs every 6 (six) hours as needed for wheezing or shortness of breath., Disp: 1 Inhaler, Rfl: 1 .  Ascorbic Acid (VITAMIN C) 100 MG tablet, Take 100 mg by mouth daily., Disp: , Rfl:  .  Cholecalciferol (VITAMIN D3) 1000 units CAPS, Take by mouth., Disp: , Rfl:  .  co-enzyme Q-10 30 MG capsule, Take 30 mg by mouth 3 (three) times daily., Disp: , Rfl:  .  diazepam (VALIUM) 5 MG tablet, TAKE ONE TABLET EVERY 12 HOURS IF NEEDED FOR ANXIETY, Disp: 30 tablet, Rfl: 0 .  ferrous gluconate (FERGON) 324 MG tablet, Take 1 tablet (324 mg total) by mouth 3 (three) times daily with meals., Disp: 90 tablet, Rfl: 0 .  Fluticasone Furoate-Vilanterol (BREO ELLIPTA) 100-25 MCG/INH AEPB, Inhale 1 puff into the lungs daily., Disp: 60 each, Rfl: 2 .  Multiple Vitamin (MULTIVITAMIN) capsule, Take 1 capsule by mouth daily., Disp: , Rfl:  .  vitamin E 100 UNIT capsule, Take by mouth daily., Disp: , Rfl:   Allergies  Allergen Reactions  . Codeine   . Penicillins   . Sulfur Other (See Comments)     Patient stated redness, discomfort, and made skin bleed     ROS  Constitutional: Negative for fever, positive for weight change - loss.  Respiratory: Positive  for cough and shortness of breath.   Cardiovascular: Negative for chest pain or palpitations.  Gastrointestinal: Negative for abdominal pain, no bowel changes.  Musculoskeletal: Negative for gait problem or joint swelling.  Skin: Still has mass on right thigh  Neurological: Negative for dizziness or headache.  No other specific complaints in a complete review of systems (except as listed in HPI above).  Objective  Filed Vitals:   03/09/16 1014  Pulse: 118  Temp: 98.3 F (36.8 C)  TempSrc: Oral  Resp: 20  Height: 5\' 4"  (1.626 m)  Weight: 110 lb 11.2 oz (50.213 kg)  SpO2: 98%    Body mass index is 18.99 kg/(m^2).  Physical Exam  Constitutional: Patient appears  well-developed . Underweight.No distress.  HEENT: head atraumatic, normocephalic, pupils equal and reactive to light,  neck supple, throat within normal limits Cardiovascular: Normal rate, regular rhythm and normal heart sounds.  No murmur heard. No BLE edema. Pulmonary/Chest: Effort normal and breath sounds normal. No respiratory distress. Abdominal: Soft.  There is no tenderness. Psychiatric: Patient has a normal mood and affect. behavior is normal. Judgment and thought content normal.  PHQ2/9: Depression screen [facility] 2/9 03/09/2016 11/10/2015 08/04/2015  Decreased Interest 0 0  0  Down, Depressed, Hopeless 0 0 0  PHQ - 2 Score 0 0 0    Fall Risk: Fall Risk  03/09/2016 11/10/2015  Falls in the past year? No No     Functional Status Survey: Is the patient deaf or have difficulty hearing?: No Does the patient have difficulty seeing, even when wearing glasses/contacts?: No Does the patient have difficulty concentrating, remembering, or making decisions?: No Does the patient have difficulty walking or climbing stairs?: No Does the patient have difficulty dressing or bathing?: No Does the patient have difficulty doing errands alone such as visiting a doctor's office or shopping?: No    Assessment & Plan  1. Asthma, moderate persistent, poorly-controlled  She needs samples of Breo, it works well for her but does not have insurance at this time  2. Mass of leg, right  Can't afford biopsy or surgery at this time  3. Iron deficiency anemia  She needs to resume supplementation  4. Moderate major depression ([place])  She is not on SSRI, she states she is doing better, but she struggles with her husband - he has bipolar and when he is depressed it is very difficulty to be around him. They have been married for 41 years  5. Panic attack  - diazepam (VALIUM) 5 MG tablet; Take 1 tablet (5 mg total) by mouth every 12 (twelve) hours as needed for anxiety.  Dispense: 30 tablet; Refill: 0  6.  Elevated blood pressure  She refuses to take medication

## 2016-04-11 ENCOUNTER — Telehealth: Payer: Self-pay | Admitting: Family Medicine

## 2016-04-19 NOTE — Telephone Encounter (Signed)
Completed.

## 2016-07-26 ENCOUNTER — Encounter: Payer: Self-pay | Admitting: Family Medicine

## 2016-07-27 ENCOUNTER — Other Ambulatory Visit: Payer: Self-pay

## 2016-07-27 ENCOUNTER — Other Ambulatory Visit: Payer: Self-pay | Admitting: Family Medicine

## 2016-07-27 DIAGNOSIS — J4540 Moderate persistent asthma, uncomplicated: Secondary | ICD-10-CM

## 2016-07-27 MED ORDER — FLUTICASONE FUROATE-VILANTEROL 100-25 MCG/INH IN AEPB: 1.0000 | INHALATION_SPRAY | Freq: Every day | RESPIRATORY_TRACT | 0 refills | Status: DC

## 2016-08-01 ENCOUNTER — Encounter: Payer: Self-pay | Admitting: Family Medicine

## 2016-08-01 ENCOUNTER — Ambulatory Visit (INDEPENDENT_AMBULATORY_CARE_PROVIDER_SITE_OTHER): Payer: Medicare Other | Admitting: Family Medicine

## 2016-08-01 VITALS — BP 118/62 | HR 108 | Temp 98.3°F | Resp 18 | Ht 64.0 in | Wt 106.3 lb

## 2016-08-01 DIAGNOSIS — R2241 Localized swelling, mass and lump, right lower limb: Secondary | ICD-10-CM

## 2016-08-01 DIAGNOSIS — D500 Iron deficiency anemia secondary to blood loss (chronic): Secondary | ICD-10-CM

## 2016-08-01 DIAGNOSIS — F410 Panic disorder [episodic paroxysmal anxiety] without agoraphobia: Secondary | ICD-10-CM | POA: Diagnosis not present

## 2016-08-01 DIAGNOSIS — Z79899 Other long term (current) drug therapy: Secondary | ICD-10-CM

## 2016-08-01 DIAGNOSIS — R739 Hyperglycemia, unspecified: Secondary | ICD-10-CM

## 2016-08-01 DIAGNOSIS — F321 Major depressive disorder, single episode, moderate: Secondary | ICD-10-CM | POA: Diagnosis not present

## 2016-08-01 DIAGNOSIS — J4540 Moderate persistent asthma, uncomplicated: Secondary | ICD-10-CM

## 2016-08-01 DIAGNOSIS — R030 Elevated blood-pressure reading, without diagnosis of hypertension: Secondary | ICD-10-CM | POA: Diagnosis not present

## 2016-08-01 DIAGNOSIS — S71101D Unspecified open wound, right thigh, subsequent encounter: Secondary | ICD-10-CM | POA: Diagnosis not present

## 2016-08-01 DIAGNOSIS — Z23 Encounter for immunization: Secondary | ICD-10-CM

## 2016-08-01 MED ORDER — DIAZEPAM 5 MG PO TABS: 5.0000 mg | ORAL_TABLET | Freq: Two times a day (BID) | ORAL | 0 refills | Status: DC | PRN

## 2016-08-01 NOTE — Patient Instructions (Signed)
Check Advair or Symbicort to find out if cheaper than [facility]

## 2016-08-01 NOTE — Progress Notes (Signed)
Name: Patricia Galloway   MRN: [id number]    DOB: March 14, 1951   Date:08/01/2016       Progress Note  Subjective  Chief Complaint  Chief Complaint  Patient presents with  . Medication Refill    6 month F/U  . Asthma    Patient states it has been good and uses Breo as needed.   . Leg Pain    Right Leg Inside Thigh, patient has had alot of bleeding from area for the past 2 weeks.  Patient has been very weak and has stated in bed for the entire 2 weeks.     HPI   Asthma: she is feeling better since she started taking Breo. She states she has not been using it daily because of cost of medication, using it every few days to control wheezing and sometimes she has a cough.   Panic Attacks and Depression: unable to tolerate Citalopram , made her feel groggy, she never started Zoloft. She states Valium is working well, no recent panic attacks. Last dose was a few weeks ago.   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 Her father died 5 years ago, mother died in Dec 24, 2014. She is doing better, more motivation now.  Not able to tolerate SSRI.   HTN: bp is at goal today, likely white coat  Anemia: she was able to have labs done and was found to have severe anemia likely secondary to right thigh open wound that bleeds on a regular basis. She is due for labs now. She is on a high iron diet, but she stopped taking ferrous sulfate  Leg mass: present for the past 9 years, initially a quarter size area. She has kept the area hidden from even her husband for all this time. Very ashamed of it, but did not want to discuss it since she does not have medical insurance. She keep the area clean, different products that she uses about twice daily and may include peroxide, witch hazel or tea tree oil. The mass has grown over the years, and it has a strong odor. She tried silvadene but it caused increase in irritation of her skin and bleeding so she stopped  after 5 weeks and is doing better now. She will have Medicare this fall and will wait to have biopsy done at that time. She was seen by Dr. Fleet Contras, concerned about cancer  Patient Active Problem List   Diagnosis Date Noted  . Hyperglycemia 08/01/2016  . Iron deficiency anemia 08/25/2015  . Mass of leg 08/25/2015  . Chronic thoracic back pain 08/04/2015  . Panic attack 08/04/2015  . Complicated grieving [id number]  . Elevated blood pressure 08/04/2015  . Moderate major depression ([place]) 08/04/2015  . Asthma, moderate persistent, poorly-controlled 08/04/2015    Past Surgical History:  Procedure Laterality Date  . COLON SURGERY  12/23/2000   colostomy for 10 months after a perfurated sigmoid   . etopic  [id number]    Family History  Problem Relation Age of Onset  . Dementia Mother   . Aortic stenosis Mother   . Osteoporosis Mother   . Dementia Father   . Diabetes Father   . Hyperlipidemia Father   . Hypertension Father   . Aneurysm Sister   . Hyperlipidemia Sister   . Hypertension Sister   . Diabetes Sister     Social History   Social History  . Marital status: Single    Spouse name: N/A  .  Number of children: N/A  . Years of education: N/A   Occupational History  . Not on file.   Social History Main Topics  . Smoking status: Former Smoker    Packs/day: 0.50    Years: 25.00    Types: Cigarettes    Quit date: 08/03/1997  . Smokeless tobacco: Never Used  . Alcohol use No  . Drug use: No  . Sexual activity: Not Currently    Birth control/ protection: Post-menopausal   Other Topics Concern  . Not on file   Social History Narrative  . No narrative on file     Current Outpatient Prescriptions:  .  albuterol (PROVENTIL HFA;VENTOLIN HFA) 108 (90 Base) MCG/ACT inhaler, Inhale 2 puffs into the lungs every 6 (six) hours as needed for wheezing or shortness of breath., Disp: 1 Inhaler, Rfl: 1 .  Ascorbic Acid (VITAMIN C) 100 MG tablet, Take 100 mg by mouth daily.,  Disp: , Rfl:  .  Cholecalciferol (VITAMIN D3) 1000 units CAPS, Take by mouth., Disp: , Rfl:  .  co-enzyme Q-10 30 MG capsule, Take 30 mg by mouth 3 (three) times daily., Disp: , Rfl:  .  diazepam (VALIUM) 5 MG tablet, Take 1 tablet (5 mg total) by mouth every 12 (twelve) hours as needed for anxiety., Disp: 30 tablet, Rfl: 0 .  ferrous gluconate (FERGON) 324 MG tablet, Take 1 tablet (324 mg total) by mouth 3 (three) times daily with meals., Disp: 90 tablet, Rfl: 0 .  fluticasone furoate-vilanterol (BREO ELLIPTA) 100-25 MCG/INH AEPB, Inhale 1 puff into the lungs daily., Disp: 60 each, Rfl: 0 .  magnesium 30 MG tablet, Take 30 mg by mouth 2 (two) times daily., Disp: , Rfl:  .  Multiple Vitamin (MULTIVITAMIN) capsule, Take 1 capsule by mouth daily., Disp: , Rfl:  .  vitamin E 100 UNIT capsule, Take 5,000 Units by mouth daily. , Disp: , Rfl:   Allergies  Allergen Reactions  . Codeine   . Penicillins   . Sulfur Other (See Comments)     Patient stated redness, discomfort, and made skin bleed     ROS  Constitutional: Negative for fever, positive for weight change.  Respiratory: Positive  for cough and intermittent  shortness of breath.   Cardiovascular: Negative for chest pain or palpitations.  Gastrointestinal: Negative for abdominal pain, no bowel changes.  Musculoskeletal: Negative for gait problem or joint swelling.  Skin: open right thigh wound Neurological: Negative for dizziness or headache.  No other specific complaints in a complete review of systems (except as listed in HPI above).  Objective  Vitals:   08/01/16 1331  BP: 118/62  Pulse: (!) 108  Resp: 18  Temp: 98.3 F (36.8 C)  TempSrc: Oral  SpO2: 98%  Weight: 106 lb 4.8 oz (48.2 kg)  Height: 5\' 4"  (1.626 m)    Body mass index is 18.25 kg/m.  Physical Exam  Constitutional: Patient appears well-developed and well-nourished. No distress.  HEENT: head atraumatic, normocephalic, pupils equal and reactive to light,   neck supple, throat within normal limits Cardiovascular: Normal rate, regular rhythm and normal heart sounds.  No murmur heard. No BLE edema. Pulmonary/Chest: Effort normal and breath sounds normal. No respiratory distress. Abdominal: Soft.  There is no tenderness. Psychiatric: Patient has a normal mood and affect. behavior is normal. Judgment and thought content normal. Skin: did not exam leg today, she will see Dr. Fleet Contras next week  PHQ2/9: Depression screen [facility] 2/9 08/01/2016 03/09/2016 11/10/2015 08/04/2015  Decreased Interest  0 0 0 0  Down, Depressed, Hopeless 0 0 0 0  PHQ - 2 Score 0 0 0 0     Fall Risk: Fall Risk  08/01/2016 03/09/2016 11/10/2015  Falls in the past year? No No No     Functional Status Survey: Is the patient deaf or have difficulty hearing?: No Does the patient have difficulty seeing, even when wearing glasses/contacts?: No Does the patient have difficulty concentrating, remembering, or making decisions?: No Does the patient have difficulty walking or climbing stairs?: No Does the patient have difficulty dressing or bathing?: No Does the patient have difficulty doing errands alone such as visiting a doctor's office or shopping?: No    Assessment & Plan  1. Asthma, moderate persistent, poorly-controlled  She has a Firefighter rx at pharmacy, she will check for cheaper options, discussed Symbicort and Advair  2. Iron deficiency anemia due to chronic blood loss  - CBC with Differential/Platelet  3. Moderate major depression in remission  (HCC)  In remission , she had hospice counseling and is doing well   4. Mass of leg, right  -referral to surgeon - Dr. Fleet Contras  5. Hyperglycemia  - Hemoglobin A1c  6. Long-term use of high-risk medication  - COMPLETE METABOLIC PANEL WITH GFR  7. Open wound of right thigh, subsequent encounter  - Tdap vaccine greater than or equal to 7yo IM  8. Need for Tdap vaccination  Not given - patient left before we gave her  the shot  9. Needs flu shot  refused  10. Need for shingles vaccine  -refused  11. Need for pneumococcal vaccination  refused  12. White coat syndrome without diagnosis of hypertension  - COMPLETE METABOLIC PANEL WITH GFR, bp is at goal today  13. Panic attack  - diazepam (VALIUM) 5 MG tablet; Take 1 tablet (5 mg total) by mouth every 12 (twelve) hours as needed for anxiety.  Dispense: 30 tablet; Refill: 0

## 2016-08-05 DIAGNOSIS — C801 Malignant (primary) neoplasm, unspecified: Secondary | ICD-10-CM

## 2016-08-05 HISTORY — DX: Malignant (primary) neoplasm, unspecified: C80.1

## 2016-08-08 ENCOUNTER — Ambulatory Visit (INDEPENDENT_AMBULATORY_CARE_PROVIDER_SITE_OTHER): Payer: Medicare Other

## 2016-08-08 DIAGNOSIS — R739 Hyperglycemia, unspecified: Secondary | ICD-10-CM | POA: Diagnosis not present

## 2016-08-08 DIAGNOSIS — Z23 Encounter for immunization: Secondary | ICD-10-CM | POA: Diagnosis not present

## 2016-08-08 DIAGNOSIS — D500 Iron deficiency anemia secondary to blood loss (chronic): Secondary | ICD-10-CM | POA: Diagnosis not present

## 2016-08-08 DIAGNOSIS — Z79899 Other long term (current) drug therapy: Secondary | ICD-10-CM | POA: Diagnosis not present

## 2016-08-08 LAB — CBC WITH DIFFERENTIAL/PLATELET
BASOS ABS: 63 {cells}/uL (ref 0–200)
Basophils Relative: 1 %
EOS PCT: 1 %
Eosinophils Absolute: 63 cells/uL (ref 15–500)
HEMATOCRIT: 22 % — AB (ref 35.0–45.0)
HEMOGLOBIN: 6.1 g/dL — AB (ref 11.7–15.5)
LYMPHS PCT: 18 %
Lymphs Abs: 1134 cells/uL (ref 850–3900)
MCH: 21.2 pg — AB (ref 27.0–33.0)
MCHC: 27.7 g/dL — AB (ref 32.0–36.0)
MCV: 76.4 fL — ABNORMAL LOW (ref 80.0–100.0)
MPV: 9 fL (ref 7.5–12.5)
Monocytes Absolute: 756 cells/uL (ref 200–950)
Monocytes Relative: 12 %
Neutro Abs: 4284 cells/uL (ref 1500–7800)
Neutrophils Relative %: 68 %
Platelets: 486 10*3/uL — ABNORMAL HIGH (ref 140–400)
RBC: 2.88 MIL/uL — AB (ref 3.80–5.10)
RDW: 18.8 % — AB (ref 11.0–15.0)
WBC: 6.3 10*3/uL (ref 3.8–10.8)

## 2016-08-08 LAB — HEMOGLOBIN A1C
HEMOGLOBIN A1C: 4.5 % (ref <5.7)
Mean Plasma Glucose: 82 mg/dL

## 2016-08-08 LAB — COMPLETE METABOLIC PANEL WITH GFR
ALBUMIN: 4 g/dL (ref 3.6–5.1)
ALK PHOS: 60 U/L (ref 33–130)
ALT: 8 U/L (ref 6–29)
AST: 11 U/L (ref 10–35)
BUN: 18 mg/dL (ref 7–25)
CALCIUM: 8.9 mg/dL (ref 8.6–10.4)
CHLORIDE: 103 mmol/L (ref 98–110)
CO2: 24 mmol/L (ref 20–31)
Creat: 0.55 mg/dL (ref 0.50–0.99)
GFR, Est African American: >89 mL/min (ref >=60)
Glucose, Bld: 107 mg/dL — ABNORMAL HIGH (ref 65–99)
POTASSIUM: 4 mmol/L (ref 3.5–5.3)
SODIUM: 136 mmol/L (ref 135–146)
Total Bilirubin: 0.3 mg/dL (ref 0.2–1.2)
Total Protein: 6.3 g/dL (ref 6.1–8.1)

## 2016-08-09 ENCOUNTER — Telehealth: Payer: Self-pay

## 2016-08-09 ENCOUNTER — Other Ambulatory Visit: Payer: Self-pay | Admitting: Family Medicine

## 2016-08-09 DIAGNOSIS — D500 Iron deficiency anemia secondary to blood loss (chronic): Secondary | ICD-10-CM

## 2016-08-09 NOTE — Telephone Encounter (Signed)
Kim from [facility] called with and alert low hemoglobin of 6.1

## 2016-08-10 ENCOUNTER — Encounter: Payer: Self-pay | Admitting: General Surgery

## 2016-08-10 ENCOUNTER — Ambulatory Visit (INDEPENDENT_AMBULATORY_CARE_PROVIDER_SITE_OTHER): Payer: Medicare Other | Admitting: General Surgery

## 2016-08-10 VITALS — BP 178/94 | HR 102 | Resp 12 | Ht 64.0 in | Wt 105.0 lb

## 2016-08-10 DIAGNOSIS — C44712 Basal cell carcinoma of skin of right lower limb, including hip: Secondary | ICD-10-CM | POA: Diagnosis not present

## 2016-08-10 DIAGNOSIS — R2241 Localized swelling, mass and lump, right lower limb: Secondary | ICD-10-CM | POA: Diagnosis not present

## 2016-08-10 NOTE — Patient Instructions (Signed)
The patient is aware to call back for any questions or concerns.  

## 2016-08-10 NOTE — Progress Notes (Signed)
Patient ID: Patricia Galloway, female   DOB: 06-27-1951, 65 y.o.   MRN: [id number]  Chief Complaint  Patient presents with  . Procedure    right leg biopsy    HPI Patricia Galloway is a 65 y.o. female here today for a right leg biopsy. She states the mass has been there for 9-10 years. It has gotten larger over the past 2 years with some bleeding. She is here with Jarold Song, her cousin.   HPI  Past Medical History:  Diagnosis Date  . Asthma    as child , but no episodes in over 30 years    Past Surgical History:  Procedure Laterality Date  . COLON SURGERY  2002   colostomy for 10 months after a perfurated sigmoid   . COLOSTOMY REVERSAL  2003  . etopic  [id number]    Family History  Problem Relation Age of Onset  . Dementia Mother   . Aortic stenosis Mother   . Osteoporosis Mother   . Dementia Father   . Diabetes Father   . Hyperlipidemia Father   . Hypertension Father   . Aneurysm Sister   . Hyperlipidemia Sister   . Hypertension Sister   . Diabetes Sister     Social History Social History  Substance Use Topics  . Smoking status: Former Smoker    Packs/day: 0.50    Years: 25.00    Types: Cigarettes    Quit date: 08/03/1997  . Smokeless tobacco: Never Used  . Alcohol use No    Allergies  Allergen Reactions  . Codeine   . Penicillins   . Sulfur Other (See Comments)     Patient stated redness, discomfort, and made skin bleed    Current Outpatient Prescriptions  Medication Sig Dispense Refill  . albuterol (PROVENTIL HFA;VENTOLIN HFA) 108 (90 Base) MCG/ACT inhaler Inhale 2 puffs into the lungs every 6 (six) hours as needed for wheezing or shortness of breath. 1 Inhaler 1  . Ascorbic Acid (VITAMIN C) 100 MG tablet Take 100 mg by mouth daily.    . Cholecalciferol (VITAMIN D3) 1000 units CAPS Take by mouth.    . co-enzyme Q-10 30 MG capsule Take 30 mg by mouth 3 (three) times daily.    . diazepam (VALIUM) 5 MG tablet Take 1 tablet (5 mg total) by mouth every  12 (twelve) hours as needed for anxiety. 30 tablet 0  . fluticasone furoate-vilanterol (BREO ELLIPTA) 100-25 MCG/INH AEPB Inhale 1 puff into the lungs daily. (Patient taking differently: Inhale 1 puff into the lungs once a week. ) 60 each 0  . magnesium 30 MG tablet Take 30 mg by mouth 2 (two) times daily.    . Multiple Vitamin (MULTIVITAMIN) capsule Take 1 capsule by mouth daily.    . vitamin E 100 UNIT capsule Take 5,000 Units by mouth daily.      No current facility-administered medications for this visit.     Review of Systems Review of Systems  Constitutional: Negative.   Respiratory: Negative.   Cardiovascular: Negative.     Blood pressure (!) 178/94, pulse (!) 102, resp. rate 12, height 5\' 4"  (1.626 m), weight 105 lb (47.6 kg).  The patient's weight in December 2016: 117 pounds.  Physical Exam Physical Exam  Constitutional: She is oriented to person, place, and time. She appears well-developed and well-nourished.  HENT:  Mouth/Throat: Oropharynx is clear and moist.  Eyes: Conjunctivae are normal. No scleral icterus.  Neck: Neck supple.  Cardiovascular: Normal rate, regular rhythm and  normal heart sounds.   Pulses:      Carotid pulses are 2+ on the right side, and 2+ on the left side.      Femoral pulses are 2+ on the right side, and 2+ on the left side.      Popliteal pulses are 2+ on the right side, and 2+ on the left side.  Pulmonary/Chest: Effort normal. No accessory muscle usage. No respiratory distress. She has rhonchi in the right upper field, the right middle field, the right lower field, the left upper field and the left lower field.  Abdominal: Soft. Normal appearance and bowel sounds are normal. There is no hepatomegaly. There is no tenderness.  Musculoskeletal:       Legs: Lymphadenopathy:    She has no cervical adenopathy.       Right: Inguinal adenopathy present.       Left: No inguinal adenopathy present.  Neurological: She is alert and oriented to person,  place, and time.  Skin: Skin is warm and dry.  9.5 x 12 mass right distal thigh  Psychiatric: Her behavior is normal.    Data Reviewed Laboratory studies dated 08/08/2016 showed a hemoglobin of 6.1 with an MCV of 76, platelet count 486,000. White blood cell count of 6300. Hemoglobin 9 months ago 13.0. MCV of 78.  Complete metabolic panel showed normal electrolytes and renal function, nonfasting blood sugar 107. Creatinine 0.5 with an estimated GFR greater than 89. Liver function studies.  PCP notes of 08/01/2016 reviewed.  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    Plan    The patient was amenable to have a biopsy completed of the multilobulated mass of the right distal anterior medial thigh. The area was cleansed with Betadine and 10 mL of 0.5% Xylocaine with 0.25% Marcaine with 1-200,000 of epinephrine was utilized well tolerated. A 2.5 mm punch biopsy was completed in 2 areas with hemostasis achieved with a 4-0 Prolene simple suture. Telfa and Tegaderm dressing applied.    The patient will likely require either a CT scan chest/abdomen/pelvis/thigh or a PET scan. We'll make a final determination when pathology is available.  This information has been scribed by Karie Fetch RN, BSN,BC.  Robert Bellow 08/10/2016, 5:40 PM

## 2016-08-14 NOTE — Progress Notes (Signed)
[place]  [phone] [phone]  ID: Patricia Galloway OB: April 20, 1951  MR#: [id number]  [id number]  Patient Care Team: [name], MD as PCP - General (Family Medicine) [name], MD as Attending Physician (Family Medicine) Robert Bellow, MD (General Surgery)  CHIEF COMPLAINT: Iron deficiency anemia, locally advanced basal cell carcinoma of the skin.  INTERVAL HISTORY: Patient is a 65 year old female who is noted an enlarging right leg lesion over the past 9 years that would occasionally bleed. The bleeding had come more frequently recently. 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 She otherwise feels well and is asymptomatic. She has no neurologic complaints. She denies any recent fevers. She has good appetite and denies weight loss. She has no chest pain or shortness of breath. She denies any nausea, vomiting, constipation, or diarrhea. She has no urinary complaints. Patient otherwise feels well and offers no further specific complaints.  REVIEW OF SYSTEMS:   Review of Systems  Constitutional: Negative.  Negative for fever, malaise/fatigue and weight loss.  Respiratory: Negative.  Negative for cough and shortness of breath.   Cardiovascular: Negative.  Negative for chest pain and leg swelling.  Gastrointestinal: Negative.  Negative for abdominal pain.  Genitourinary: Negative.   Musculoskeletal:       Right leg mass with associated pain and bleeding.  Neurological: Negative.  Negative for sensory change, focal weakness and weakness.  Psychiatric/Behavioral: Negative.  The patient is not nervous/anxious.     As per HPI. Otherwise, a complete review of systems is negative.  PAST MEDICAL HISTORY: Past Medical History:  Diagnosis Date  . Asthma    as child , but no episodes in over 30 years    PAST SURGICAL HISTORY: Past  Surgical History:  Procedure Laterality Date  . COLON SURGERY  2002   colostomy for 10 months after a perfurated sigmoid   . COLOSTOMY REVERSAL  2003  . etopic  [id number]    FAMILY HISTORY: Family History  Problem Relation Age of Onset  . Dementia Mother   . Aortic stenosis Mother   . Osteoporosis Mother   . Dementia Father   . Diabetes Father   . Hyperlipidemia Father   . Hypertension Father   . Aneurysm Sister   . Hyperlipidemia Sister   . Hypertension Sister   . Diabetes Sister     ADVANCED DIRECTIVES (Y/N):  N  HEALTH MAINTENANCE: Social History  Substance Use Topics  . Smoking status: Former Smoker    Packs/day: 0.50    Years: 25.00    Types: Cigarettes    Quit date: 08/03/1997  . Smokeless tobacco: Never Used  . Alcohol use No     Colonoscopy:  PAP:  Bone density:  Lipid panel:  Allergies  Allergen Reactions  . Codeine   . Penicillins   . Sulfur Other (See Comments)     Patient stated redness, discomfort, and made skin bleed    Current Outpatient Prescriptions  Medication Sig Dispense Refill  . albuterol (PROVENTIL HFA;VENTOLIN HFA) 108 (90 Base) MCG/ACT inhaler Inhale 2 puffs into the lungs every 6 (six) hours as needed for wheezing or shortness of breath. 1 Inhaler 1  . Ascorbic Acid (VITAMIN C) 100 MG tablet Take 100 mg by mouth daily.    . Cholecalciferol (VITAMIN D3) 1000 units CAPS Take by mouth.    . co-enzyme Q-10 30 MG capsule Take 30 mg by mouth  3 (three) times daily.    . diazepam (VALIUM) 5 MG tablet Take 1 tablet (5 mg total) by mouth every 12 (twelve) hours as needed for anxiety. 30 tablet 0  . fluticasone furoate-vilanterol (BREO ELLIPTA) 100-25 MCG/INH AEPB Inhale 1 puff into the lungs daily. (Patient taking differently: Inhale 1 puff into the lungs once a week. ) 60 each 0  . magnesium 30 MG tablet Take 30 mg by mouth 2 (two) times daily.    . Multiple Vitamin (MULTIVITAMIN) capsule Take 1 capsule by mouth daily.    . vitamin E 100  UNIT capsule Take 5,000 Units by mouth daily.      No current facility-administered medications for this visit.     OBJECTIVE: Vitals:   08/16/16 1040  BP: (!) 198/102  Pulse: (!) 116  Resp: 18  Temp: 99.4 F (37.4 C)     Body mass index is 18.32 kg/m.    ECOG FS:0 - Asymptomatic  General: Well-developed, well-nourished, no acute distress. Eyes: Pink conjunctiva, anicteric sclera. HEENT: Normocephalic, moist mucous membranes, clear oropharnyx. Lungs: Clear to auscultation bilaterally. Heart: Regular rate and rhythm. No rubs, murmurs, or gallops. Abdomen: Soft, nontender, nondistended. No organomegaly noted, normoactive bowel sounds. Musculoskeletal: No edema, cyanosis, or clubbing. Neuro: Alert, answering all questions appropriately. Cranial nerves grossly intact. Skin: Large 6 cm mass noted on right anterior medial leg. Psych: Normal affect. Lymphatics: No cervical, calvicular, axillary or inguinal LAD.   LAB RESULTS:  Lab Results  Component Value Date   NA 136 08/08/2016   K 4.0 08/08/2016   CL 103 08/08/2016   CO2 24 08/08/2016   GLUCOSE 107 (H) 08/08/2016   BUN 18 08/08/2016   CREATININE 0.55 08/08/2016   CALCIUM 8.9 08/08/2016   PROT 6.3 08/08/2016   ALBUMIN 4.0 08/08/2016   AST 11 08/08/2016   ALT 8 08/08/2016   ALKPHOS 60 08/08/2016   BILITOT 0.3 08/08/2016   GFRNONAA >89 08/08/2016   GFRAA >89 08/08/2016    Lab Results  Component Value Date   WBC 6.0 08/16/2016   NEUTROABS 3.9 08/16/2016   HGB 7.3 (L) 08/16/2016   HCT 24.4 (L) 08/16/2016   MCV 71.4 (L) 08/16/2016   PLT 436 08/16/2016   Lab Results  Component Value Date   IRON 10 (L) 08/16/2016   TIBC 434 08/16/2016   IRONPCTSAT 2 (L) 08/16/2016   Lab Results  Component Value Date   FERRITIN 5 (L) 08/16/2016    STUDIES: No results found.  ASSESSMENT: Iron deficiency anemia, locally advanced basal cell carcinoma of the skin.  PLAN:    1. Iron deficiency anemia: Patient's hemoglobin  and iron stores are significantly decreased likely secondary to persistent bleeding from her leg mass. Patient will return to clinic tomorrow to receive 2 units of packed red blood cells. She also may benefit from iron infusions in the future. Return to clinic on August 26, 2016 for repeat laboratory work and further evaluation. 2. Basal cell carcinoma: Patient states this lesion has been growing for 9 years and she did not seek medical attention. Diagnosis confirmed by biopsy from surgery. Patient has imaging scheduled on August 25, 2016 for further evaluation and to assess for metastatic disease. Although the natural history of skin basal cell carcinomas are typically do not reach this point, they also typically do not metastasize. Patient will likely require optimal debulking with surgery. Can also consider a hedgehog inhibitor for treatment. Return to clinic as above.  Approximately 45 minutes was spent in  discussion of which greater than 50% was consultation.  Patient expressed understanding and was in agreement with this plan. She also understands that She can call clinic at any time with any questions, concerns, or complaints.   No matching staging information was found for the patient.  Lloyd Huger, MD   08/19/2016 1:31 PM

## 2016-08-15 ENCOUNTER — Encounter: Payer: Self-pay | Admitting: General Surgery

## 2016-08-15 ENCOUNTER — Other Ambulatory Visit: Payer: Self-pay | Admitting: General Surgery

## 2016-08-16 ENCOUNTER — Other Ambulatory Visit: Payer: Self-pay

## 2016-08-16 ENCOUNTER — Inpatient Hospital Stay: Payer: Medicare Other

## 2016-08-16 ENCOUNTER — Inpatient Hospital Stay: Payer: Medicare Other | Attending: Oncology | Admitting: Oncology

## 2016-08-16 VITALS — BP 198/102 | HR 116 | Temp 99.4°F | Resp 18 | Wt 106.7 lb

## 2016-08-16 DIAGNOSIS — Z79899 Other long term (current) drug therapy: Secondary | ICD-10-CM | POA: Diagnosis not present

## 2016-08-16 DIAGNOSIS — J45909 Unspecified asthma, uncomplicated: Secondary | ICD-10-CM | POA: Diagnosis not present

## 2016-08-16 DIAGNOSIS — C44712 Basal cell carcinoma of skin of right lower limb, including hip: Secondary | ICD-10-CM

## 2016-08-16 DIAGNOSIS — Z87891 Personal history of nicotine dependence: Secondary | ICD-10-CM | POA: Insufficient documentation

## 2016-08-16 DIAGNOSIS — D509 Iron deficiency anemia, unspecified: Secondary | ICD-10-CM | POA: Diagnosis not present

## 2016-08-16 DIAGNOSIS — I10 Essential (primary) hypertension: Secondary | ICD-10-CM | POA: Diagnosis not present

## 2016-08-16 DIAGNOSIS — D500 Iron deficiency anemia secondary to blood loss (chronic): Secondary | ICD-10-CM

## 2016-08-16 LAB — CBC WITH DIFFERENTIAL/PLATELET
BASOS PCT: 1 %
Basophils Absolute: 0.1 10*3/uL (ref 0–0.1)
EOS ABS: 0.1 10*3/uL (ref 0–0.7)
EOS PCT: 2 %
HCT: 24.4 % — ABNORMAL LOW (ref 35.0–47.0)
Hemoglobin: 7.3 g/dL — ABNORMAL LOW (ref 12.0–16.0)
LYMPHS ABS: 1.3 10*3/uL (ref 1.0–3.6)
Lymphocytes Relative: 21 %
MCH: 21.3 pg — AB (ref 26.0–34.0)
MCHC: 29.8 g/dL — AB (ref 32.0–36.0)
MCV: 71.4 fL — ABNORMAL LOW (ref 80.0–100.0)
MONO ABS: 0.6 10*3/uL (ref 0.2–0.9)
MONOS PCT: 11 %
Neutro Abs: 3.9 10*3/uL (ref 1.4–6.5)
Neutrophils Relative %: 65 %
PLATELETS: 436 10*3/uL (ref 150–440)
RBC: 3.42 MIL/uL — ABNORMAL LOW (ref 3.80–5.20)
RDW: 18.7 % — AB (ref 11.5–14.5)
WBC: 6 10*3/uL (ref 3.6–11.0)

## 2016-08-16 LAB — FERRITIN: FERRITIN: 5 ng/mL — AB (ref 11–307)

## 2016-08-16 LAB — IRON AND TIBC
IRON: 10 ug/dL — AB (ref 28–170)
SATURATION RATIOS: 2 % — AB (ref 10.4–31.8)
TIBC: 434 ug/dL (ref 250–450)
UIBC: 424 ug/dL

## 2016-08-16 NOTE — Progress Notes (Signed)
Patient ID: Patricia Galloway, female   DOB: 1951/08/20, 65 y.o.   MRN: [id number] The patient is scheduled for a CT angiogram of right lower extremity with contrast, CT of the chest, abdomen, and pelvis with contrast at [facility] on 08/25/16 at 9:00 am. She will need to arrive by 8:45 am and have nothing to eat or drink for 4 hours prior. She will pick up a prep kit this week. The patient is aware of date, time, and instructions.

## 2016-08-16 NOTE — Progress Notes (Signed)
New evaluation for IDA. Offers no complaints.

## 2016-08-17 ENCOUNTER — Inpatient Hospital Stay: Payer: Medicare Other

## 2016-08-17 ENCOUNTER — Other Ambulatory Visit: Payer: Self-pay | Admitting: Oncology

## 2016-08-17 DIAGNOSIS — D500 Iron deficiency anemia secondary to blood loss (chronic): Secondary | ICD-10-CM

## 2016-08-17 DIAGNOSIS — C44712 Basal cell carcinoma of skin of right lower limb, including hip: Secondary | ICD-10-CM | POA: Diagnosis not present

## 2016-08-17 DIAGNOSIS — J45909 Unspecified asthma, uncomplicated: Secondary | ICD-10-CM | POA: Diagnosis not present

## 2016-08-17 DIAGNOSIS — I10 Essential (primary) hypertension: Secondary | ICD-10-CM | POA: Diagnosis not present

## 2016-08-17 DIAGNOSIS — D509 Iron deficiency anemia, unspecified: Secondary | ICD-10-CM | POA: Diagnosis not present

## 2016-08-17 DIAGNOSIS — Z79899 Other long term (current) drug therapy: Secondary | ICD-10-CM | POA: Diagnosis not present

## 2016-08-17 DIAGNOSIS — Z87891 Personal history of nicotine dependence: Secondary | ICD-10-CM | POA: Diagnosis not present

## 2016-08-17 LAB — ABO/RH: ABO/RH(D): A POS

## 2016-08-17 LAB — PREPARE RBC (CROSSMATCH)

## 2016-08-17 MED ORDER — DIPHENHYDRAMINE HCL 50 MG/ML IJ SOLN
25.0000 mg | Freq: Once | INTRAMUSCULAR | Status: AC
  Administered 2016-08-17: 25 mg via INTRAVENOUS
  Filled 2016-08-17: qty 1

## 2016-08-17 MED ORDER — ACETAMINOPHEN 325 MG PO TABS: 650.0000 mg | ORAL_TABLET | Freq: Once | ORAL | Status: DC

## 2016-08-17 MED ORDER — SODIUM CHLORIDE 0.9 % IV SOLN
250.0000 mL | Freq: Once | INTRAVENOUS | Status: AC
  Administered 2016-08-17: 250 mL via INTRAVENOUS
  Filled 2016-08-17: qty 250

## 2016-08-18 ENCOUNTER — Other Ambulatory Visit: Payer: Self-pay | Admitting: Pathology

## 2016-08-18 ENCOUNTER — Inpatient Hospital Stay: Payer: Medicare Other

## 2016-08-18 LAB — TYPE AND SCREEN
ABO/RH(D): A POS
ANTIBODY SCREEN: NEGATIVE
Unit division: 0

## 2016-08-24 DIAGNOSIS — C44712 Basal cell carcinoma of skin of right lower limb, including hip: Secondary | ICD-10-CM | POA: Insufficient documentation

## 2016-08-24 NOTE — Progress Notes (Signed)
[place]  [phone] [phone]) [phone]  ID: Patricia Galloway OB: [birth year]  MR#: [id number]  [id number]  Patient Care Team: [name], MD as PCP - General (Family Medicine) [name], MD as Attending Physician (Family Medicine) Robert Bellow, MD (General Surgery)  CHIEF COMPLAINT: Iron deficiency anemia, locally advanced basal cell carcinoma of the right leg.  INTERVAL HISTORY: Patient returns to clinic today for repeat laboratory work, further evaluation, and treatment planning for her basal cell carcinoma. She feels significantly improved after receiving a blood transfusion 1-2 weeks ago. She currently feels well and is asymptomatic. She has no neurologic complaints. She denies any recent fevers. She has good appetite and denies weight loss. She has no chest pain or shortness of breath. She denies any nausea, vomiting, constipation, or diarrhea. She has no urinary complaints. Patient offers no specific complaints today.  REVIEW OF SYSTEMS:   Review of Systems  Constitutional: Negative.  Negative for fever, malaise/fatigue and weight loss.  Respiratory: Negative.  Negative for cough and shortness of breath.   Cardiovascular: Negative.  Negative for chest pain and leg swelling.  Gastrointestinal: Negative.  Negative for abdominal pain.  Genitourinary: Negative.   Musculoskeletal:       Right leg mass with associated pain and bleeding.  Neurological: Negative.  Negative for sensory change, focal weakness and weakness.  Psychiatric/Behavioral: Negative.  The patient is not nervous/anxious.     As per HPI. Otherwise, a complete review of systems is negative.  PAST MEDICAL HISTORY: Past Medical History:  Diagnosis Date  . Asthma    as child , but no episodes in over 30 years  . Cancer [facility])     PAST SURGICAL HISTORY: Past Surgical History:  Procedure Laterality Date  . COLON SURGERY  2002   colostomy for 10 months after a  perfurated sigmoid   . COLOSTOMY REVERSAL  2003  . etopic  [id number]    FAMILY HISTORY: Family History  Problem Relation Age of Onset  . Dementia Mother   . Aortic stenosis Mother   . Osteoporosis Mother   . Dementia Father   . Diabetes Father   . Hyperlipidemia Father   . Hypertension Father   . Aneurysm Sister   . Hyperlipidemia Sister   . Hypertension Sister   . Diabetes Sister     ADVANCED DIRECTIVES (Y/N):  N  HEALTH MAINTENANCE: Social History  Substance Use Topics  . Smoking status: Former Smoker    Packs/day: 0.50    Years: 25.00    Types: Cigarettes    Quit date: 08/03/1997  . Smokeless tobacco: Never Used  . Alcohol use No     Colonoscopy:  PAP:  Bone density:  Lipid panel:  Allergies  Allergen Reactions  . Codeine   . Penicillins   . Sulfur Other (See Comments)     Patient stated redness, discomfort, and made skin bleed    Current Outpatient Prescriptions  Medication Sig Dispense Refill  . Ascorbic Acid (VITAMIN C) 100 MG tablet Take 100 mg by mouth daily.    . Cholecalciferol (VITAMIN D3) 1000 units CAPS Take by mouth.    . co-enzyme Q-10 30 MG capsule Take 30 mg by mouth 3 (three) times daily.    . diazepam (VALIUM) 5 MG tablet Take 1 tablet (5 mg total) by mouth every 12 (twelve) hours as needed for anxiety. 30 tablet 0  . fluticasone furoate-vilanterol (BREO ELLIPTA) 100-25 MCG/INH AEPB Inhale 1 puff into the lungs daily. (Patient taking  differently: Inhale 1 puff into the lungs once a week. ) 60 each 0  . magnesium 30 MG tablet Take 30 mg by mouth 2 (two) times daily.    . Multiple Vitamin (MULTIVITAMIN) capsule Take 1 capsule by mouth daily.    . vitamin E 100 UNIT capsule Take 5,000 Units by mouth daily.     Marland Kitchen albuterol (PROVENTIL HFA;VENTOLIN HFA) 108 (90 Base) MCG/ACT inhaler Inhale 2 puffs into the lungs every 6 (six) hours as needed for wheezing or shortness of breath. (Patient not taking: Reported on 08/26/2016) 1 Inhaler 1  .  vismodegib (ERIVEDGE) 150 MG capsule Take 1 capsule (150 mg total) by mouth daily. 30 capsule 2   No current facility-administered medications for this visit.     OBJECTIVE: Vitals:   08/26/16 0913  BP: (!) 189/102  Pulse: 91     Body mass index is 18.05 kg/m.    ECOG FS:0 - Asymptomatic  General: Well-developed, well-nourished, no acute distress. Eyes: Pink conjunctiva, anicteric sclera. HEENT: Normocephalic, moist mucous membranes, clear oropharnyx. Lungs: Clear to auscultation bilaterally. Heart: Regular rate and rhythm. No rubs, murmurs, or gallops. Abdomen: Soft, nontender, nondistended. No organomegaly noted, normoactive bowel sounds. Musculoskeletal: No edema, cyanosis, or clubbing. Neuro: Alert, answering all questions appropriately. Cranial nerves grossly intact. Skin: Large 6 cm mass noted on right anterior medial leg. Psych: Normal affect. Lymphatics: No cervical, calvicular, axillary or inguinal LAD.   LAB RESULTS:  Lab Results  Component Value Date   NA 136 08/08/2016   K 4.0 08/08/2016   CL 103 08/08/2016   CO2 24 08/08/2016   GLUCOSE 107 (H) 08/08/2016   BUN 18 08/08/2016   CREATININE 0.55 08/08/2016   CALCIUM 8.9 08/08/2016   PROT 6.3 08/08/2016   ALBUMIN 4.0 08/08/2016   AST 11 08/08/2016   ALT 8 08/08/2016   ALKPHOS 60 08/08/2016   BILITOT 0.3 08/08/2016   GFRNONAA >89 08/08/2016   GFRAA >89 08/08/2016    Lab Results  Component Value Date   WBC 9.6 08/26/2016   NEUTROABS 6.4 08/26/2016   HGB 10.1 (L) 08/26/2016   HCT 33.2 (L) 08/26/2016   MCV 72.8 (L) 08/26/2016   PLT 432 08/26/2016   Lab Results  Component Value Date   IRON 10 (L) 08/16/2016   TIBC 434 08/16/2016   IRONPCTSAT 2 (L) 08/16/2016   Lab Results  Component Value Date   FERRITIN 5 (L) 08/16/2016    STUDIES: Ct Chest W Contrast  Result Date: 08/25/2016 CLINICAL DATA:  Large right thigh mass showing basal cell carcinoma on biopsy . Staging. EXAM: CT CHEST, ABDOMEN, AND  PELVIS WITH CONTRAST TECHNIQUE: Multidetector CT imaging of the chest, abdomen and pelvis was performed following the standard protocol during bolus administration of intravenous contrast. CONTRAST:  100 mL Isovue 370 COMPARISON:  None. FINDINGS: CT CHEST FINDINGS Cardiovascular: 4.1 cm ascending thoracic aortic aneurysm. No evidence of dissection. Aortic atherosclerosis. Coronary artery calcification. Mediastinum/Lymph Nodes: No masses or pathologically enlarged lymph nodes identified. Lungs/Pleura: 8 mm noncalcified pulmonary nodule is seen in the left lobe on image 28/8. No other pulmonary nodules or masses identified. Mild centrilobular emphysema noted. No evidence of pleural effusion. Musculoskeletal:  No suspicious bone lesions identified. CT ABDOMEN AND PELVIS FINDINGS Hepatobiliary: No masses identified. Scattered tiny sub-cm hepatic cysts noted. Gallbladder is unremarkable. Mild biliary ductal dilatation, with common bile duct measuring 9 mm in diameter. No obstructing etiology visualized by CT Pancreas: No evidence of pancreatic mass or pancreatic ductal dilatation. Scattered  tiny pancreatic calcifications are seen in head and body, consistent chronic pancreatitis. No acute inflammatory changes or abnormal fluid collections identified. Spleen:  Within normal limits in size and appearance. Adrenals/Urinary tract:  No masses or hydronephrosis. Stomach/Bowel: No evidence of obstruction, inflammatory process, or abnormal fluid collections. Vascular/Lymphatic: No pathologically enlarged lymph nodes identified. No abdominal aortic aneurysm. Aortic atherosclerosis. Reproductive: Unremarkable uterus. 2.7 cm benign-appearing simple cyst noted in right adnexa. No worrisome adnexal mass or free fluid identified. Other:  None. Musculoskeletal:  No suspicious bone lesions identified. IMPRESSION: No definite evidence metastatic disease. Indeterminate 8 mm left upper lobe pulmonary nodule. Recommend followup by chest CT  in 6 months. Chronic calcific pancreatitis. Mild biliary ductal dilatation; although no obstructing etiology is visualized by CT, this may be due to distal common bile duct stricture related to chronic pancreatitis. Recommend correlation with liver function tests, and consider abdomen MRI and MRCP without and with contrast for further evaluation. 4.1 cm ascending thoracic aortic aneurysm. Recommend annual imaging followup by CTA or MRA. This recommendation follows 2010 ACCF/AHA/AATS/ACR/ASA/SCA/SCAI/SIR/STS/SVM Guidelines for the Diagnosis and Management of Patients with Thoracic Aortic Disease. Circulation. 2010; 121: [id number] Aortic and coronary artery atherosclerosis. Electronically Signed   By: Earle Gell M.D.   On: 08/25/2016 10:37   Ct Angio Low Extrem Right W &/or Wo Contrast  Result Date: 08/25/2016 CLINICAL DATA:  Basal cell carcinoma in the right lower extremity. Evaluate relationship of the neoplasm with the arterial structures in the right lower extremity for treatment planning. EXAM: CT ANGIOGRAPHY OF THE RIGHT LOWEREXTREMITY TECHNIQUE: Multidetector CT imaging of the right/left upper/lowerwas performed using the standard protocol during bolus administration of intravenous contrast. Multiplanar CT image reconstructions and MIPs were obtained to evaluate the vascular anatomy. CONTRAST:  100 mL Isovue 370 COMPARISON:  None. FINDINGS: Vascular structures: Right external iliac artery is patent without any significant stenosis. There is mild mural thrombus in the right common femoral artery without significant stenosis. The right profunda femoral arteries are patent. The right SFA is patent with minimal atherosclerotic disease. There is no significant stenosis in the right SFA. Small amount of calcified plaque in the distal SFA near the adductor canal. There is a prominent arterial branch coming off the distal SFA near the adductor canal which appears to be supplying the soft tissue neoplasm and  associated with the tumor vascularity. Soft tissue mass is in close proximity to the distal SFA but does not appear to be encompassing the vessel. However, the margins of the mass are poorly defined and infiltrative in the distal thigh. Minimal flat plane between the distal SFA and the soft tissue mass. Right popliteal artery is patent. Nonvascular structures: There is a large irregular ulcerative lesion involving the medial aspect of the lower right thigh. The size of this lesion is very difficult to measure. Lesion roughly measures 8.7 x 4.7 cm on the axial images on sequence 7, image 114. Craniocaudal length of this soft tissue mass is roughly 10 cm. The tumor is completely extending through the medial skin surface and margins of lesion are very nodular and irregular. Although the mass is very large and infiltrative, the mass appears to be contiguous. Mass is involving the medial thigh musculature, most prominently in the vastus medialis muscle. Mass is just anterior and adjacent to the distal sartorius muscle. There is subcutaneous edema in the medial upper thigh as well as the medial knee area caudal to the lesion. There are prominent superficial venous structures surrounding the tumor. Skeletal:  No  suspicious bone lesion. Review of the MIP images confirms the above findings. IMPRESSION: Large infiltrative mass throughout the medial aspect of the right lower thigh. This mass appears to be ulcerative and infiltrating the medial thigh musculature. The soft tissue mass is in close proximity to the distal SFA but the mass is not encompassing the vessel. In addition, there is some tumor vascularity supplied from the distal SFA. No significant stenosis involving the right lower extremity outflow vessels. Electronically Signed   By: Markus Daft M.D.   On: 08/25/2016 10:11   Ct Abdomen Pelvis W Contrast  Result Date: 08/25/2016 CLINICAL DATA:  Large right thigh mass showing basal cell carcinoma on biopsy .  Staging. EXAM: CT CHEST, ABDOMEN, AND PELVIS WITH CONTRAST TECHNIQUE: Multidetector CT imaging of the chest, abdomen and pelvis was performed following the standard protocol during bolus administration of intravenous contrast. CONTRAST:  100 mL Isovue 370 COMPARISON:  None. FINDINGS: CT CHEST FINDINGS Cardiovascular: 4.1 cm ascending thoracic aortic aneurysm. No evidence of dissection. Aortic atherosclerosis. Coronary artery calcification. Mediastinum/Lymph Nodes: No masses or pathologically enlarged lymph nodes identified. Lungs/Pleura: 8 mm noncalcified pulmonary nodule is seen in the left lobe on image 28/8. No other pulmonary nodules or masses identified. Mild centrilobular emphysema noted. No evidence of pleural effusion. Musculoskeletal:  No suspicious bone lesions identified. CT ABDOMEN AND PELVIS FINDINGS Hepatobiliary: No masses identified. Scattered tiny sub-cm hepatic cysts noted. Gallbladder is unremarkable. Mild biliary ductal dilatation, with common bile duct measuring 9 mm in diameter. No obstructing etiology visualized by CT Pancreas: No evidence of pancreatic mass or pancreatic ductal dilatation. Scattered tiny pancreatic calcifications are seen in head and body, consistent chronic pancreatitis. No acute inflammatory changes or abnormal fluid collections identified. Spleen:  Within normal limits in size and appearance. Adrenals/Urinary tract:  No masses or hydronephrosis. Stomach/Bowel: No evidence of obstruction, inflammatory process, or abnormal fluid collections. Vascular/Lymphatic: No pathologically enlarged lymph nodes identified. No abdominal aortic aneurysm. Aortic atherosclerosis. Reproductive: Unremarkable uterus. 2.7 cm benign-appearing simple cyst noted in right adnexa. No worrisome adnexal mass or free fluid identified. Other:  None. Musculoskeletal:  No suspicious bone lesions identified. IMPRESSION: No definite evidence metastatic disease. Indeterminate 8 mm left upper lobe pulmonary  nodule. Recommend followup by chest CT in 6 months. Chronic calcific pancreatitis. Mild biliary ductal dilatation; although no obstructing etiology is visualized by CT, this may be due to distal common bile duct stricture related to chronic pancreatitis. Recommend correlation with liver function tests, and consider abdomen MRI and MRCP without and with contrast for further evaluation. 4.1 cm ascending thoracic aortic aneurysm. Recommend annual imaging followup by CTA or MRA. This recommendation follows 2010 ACCF/AHA/AATS/ACR/ASA/SCA/SCAI/SIR/STS/SVM Guidelines for the Diagnosis and Management of Patients with Thoracic Aortic Disease. Circulation. 2010; 121: [id number] Aortic and coronary artery atherosclerosis. Electronically Signed   By: Earle Gell M.D.   On: 08/25/2016 10:37    ASSESSMENT: Iron deficiency anemia, locally advanced basal cell carcinoma of the right leg.  PLAN:    1. Iron deficiency anemia: Patient's hemoglobin has significantly improved to greater than 10 with transfusion 7-10 days ago. No further intervention is needed. Her iron deficiency anemia is likely secondary to bleeding from her leg mass. Case has been discussed with vascular surgery with plans to embolize mass in order to reduce bleeding as well as possibly to decrease the size of the basal cell carcinoma.  2. Basal cell carcinoma of the right leg: Patient states this lesion has been growing for 9 years and she did  not seek medical attention. Diagnosis confirmed by biopsy from surgery. CT scan results reviewed independently and reported as above with no obvious metastatic disease. Case discussed with vascular surgery as above with plans to embolize the next 1-2 weeks to reduce blood flow to lesion. Patient was also was initiated on a hedgehog inhibitor, vismodegib (Erivedge) 150 mg daily. Return to clinic in 1 month with repeat laboratory work and to assess her toleration of Erivedge.  3. Hypertension: Patient's blood pressure is  significantly elevated today. She does not appear to be on hypertensive medications. Patient states her blood pressure is typically normal and attributes it to "white coat syndrome". Monitor.  Approximately 30 minutes was spent in discussion of which greater than 50% was consultation.  Patient expressed understanding and was in agreement with this plan. She also understands that She can call clinic at any time with any questions, concerns, or complaints.   No matching staging information was found for the patient.  Lloyd Huger, MD   08/31/2016 1:11 PM

## 2016-08-25 ENCOUNTER — Ambulatory Visit
Admission: RE | Admit: 2016-08-25 | Discharge: 2016-08-25 | Disposition: A | Discharge status: Home / Self Care | Payer: Medicare Other | Source: Ambulatory Visit | Attending: General Surgery | Admitting: General Surgery

## 2016-08-25 DIAGNOSIS — C44712 Basal cell carcinoma of skin of right lower limb, including hip: Secondary | ICD-10-CM | POA: Insufficient documentation

## 2016-08-25 DIAGNOSIS — I712 Thoracic aortic aneurysm, without rupture: Secondary | ICD-10-CM | POA: Diagnosis not present

## 2016-08-25 DIAGNOSIS — I2510 Atherosclerotic heart disease of native coronary artery without angina pectoris: Secondary | ICD-10-CM | POA: Diagnosis not present

## 2016-08-25 DIAGNOSIS — I700 Atherosclerosis of aorta: Secondary | ICD-10-CM | POA: Insufficient documentation

## 2016-08-25 DIAGNOSIS — R911 Solitary pulmonary nodule: Secondary | ICD-10-CM | POA: Insufficient documentation

## 2016-08-25 DIAGNOSIS — K861 Other chronic pancreatitis: Secondary | ICD-10-CM | POA: Diagnosis not present

## 2016-08-25 HISTORY — DX: Malignant (primary) neoplasm, unspecified: C80.1

## 2016-08-25 MED ORDER — IOPAMIDOL (ISOVUE-370) INJECTION 76%
100.0000 mL | Freq: Once | INTRAVENOUS | Status: AC | PRN
  Administered 2016-08-25: 100 mL via INTRAVENOUS

## 2016-08-26 ENCOUNTER — Inpatient Hospital Stay: Payer: Medicare Other

## 2016-08-26 ENCOUNTER — Inpatient Hospital Stay (HOSPITAL_BASED_OUTPATIENT_CLINIC_OR_DEPARTMENT_OTHER): Payer: Medicare Other | Admitting: Oncology

## 2016-08-26 ENCOUNTER — Encounter: Payer: Self-pay | Admitting: General Surgery

## 2016-08-26 VITALS — BP 189/102 | HR 91 | Wt 105.2 lb

## 2016-08-26 DIAGNOSIS — Z79899 Other long term (current) drug therapy: Secondary | ICD-10-CM | POA: Diagnosis not present

## 2016-08-26 DIAGNOSIS — Z87891 Personal history of nicotine dependence: Secondary | ICD-10-CM | POA: Diagnosis not present

## 2016-08-26 DIAGNOSIS — D509 Iron deficiency anemia, unspecified: Secondary | ICD-10-CM | POA: Diagnosis not present

## 2016-08-26 DIAGNOSIS — D500 Iron deficiency anemia secondary to blood loss (chronic): Secondary | ICD-10-CM

## 2016-08-26 DIAGNOSIS — I10 Essential (primary) hypertension: Secondary | ICD-10-CM

## 2016-08-26 DIAGNOSIS — C44712 Basal cell carcinoma of skin of right lower limb, including hip: Secondary | ICD-10-CM | POA: Diagnosis not present

## 2016-08-26 DIAGNOSIS — J45909 Unspecified asthma, uncomplicated: Secondary | ICD-10-CM | POA: Diagnosis not present

## 2016-08-26 LAB — CBC WITH DIFFERENTIAL/PLATELET
BASOS ABS: 0.1 10*3/uL (ref 0–0.1)
BASOS PCT: 1 %
EOS PCT: 3 %
Eosinophils Absolute: 0.3 10*3/uL (ref 0–0.7)
HCT: 33.2 % — ABNORMAL LOW (ref 35.0–47.0)
Hemoglobin: 10.1 g/dL — ABNORMAL LOW (ref 12.0–16.0)
LYMPHS PCT: 19 %
Lymphs Abs: 1.8 10*3/uL (ref 1.0–3.6)
MCH: 22.1 pg — ABNORMAL LOW (ref 26.0–34.0)
MCHC: 30.4 g/dL — ABNORMAL LOW (ref 32.0–36.0)
MCV: 72.8 fL — AB (ref 80.0–100.0)
MONO ABS: 1 10*3/uL — AB (ref 0.2–0.9)
Monocytes Relative: 10 %
NEUTROS ABS: 6.4 10*3/uL (ref 1.4–6.5)
Neutrophils Relative %: 67 %
PLATELETS: 432 10*3/uL (ref 150–440)
RBC: 4.56 MIL/uL (ref 3.80–5.20)
RDW: 20.5 % — AB (ref 11.5–14.5)
WBC: 9.6 10*3/uL (ref 3.6–11.0)

## 2016-08-26 LAB — SAMPLE TO BLOOD BANK

## 2016-08-26 MED ORDER — VISMODEGIB 150 MG PO CAPS: 150.0000 mg | ORAL_CAPSULE | Freq: Every day | ORAL | 2 refills | Status: DC

## 2016-08-26 NOTE — Progress Notes (Signed)
Patient here today for blood transfusion.  Patient's BP elevated.  206/98 HR 96 initially.  Recheck 189/102 HR 91.  Patient states she has white coat syndrome.

## 2016-08-31 ENCOUNTER — Telehealth (HOSPITAL_COMMUNITY): Payer: Self-pay | Admitting: Pharmacist

## 2016-08-31 NOTE — Telephone Encounter (Signed)
Initial Oral Chemotherapy Pharmacist Education - Vismodegib ([place])  Patricia Galloway is a 65 yo female diagnosed with basal cell carcinoma. Patient was called on 08/31/2016 to discuss new medication, vismodegib.  - Medication/dose: vismodegib 150mg  PO daily - Start date/med received date: Not yet received - She is working to get the medication from [employer] [phone]). Her copay with Medicare was going to be around $2,800 per month, so they will set her up with the Patient [employer]. If for some reason she is not approved for this program to help with the cost, the manufacturer has a program that she can apply to next. - Indication and goal of therapy: basal cell carcinoma, shrink/stop progression of BCC - Administration: with or without food, same time each day; swallow capsules whole - Adherence importance and techniques to use: Will plan to take with other medications in the morning.  - Adverse Effects: Discussed including but not limited to weight loss, fatigue, metallic taste/loss of taste, muscle aches, hair loss.  - Drug Interactions: No drug interactions noted with patient's current Epic med list. Patricia Galloway said that she has just started "Rishi mushroom and NK activator" supplements." I recommended that she discuss this with her oncologist because we do not know what the effects of these supplements are, especially in combination with new oral chemotherapy medications. - Handling precautions: do not allow any people to handle medication, especially females of reproductive age  Patient understands what we discussed today about his/her new medication, and understands to call oral chemotherapy me with and additional questions or concerns that may arise.   Per patient, it is okay to speak with her husband about medications.  Demetrius Charity, PharmD Acute Care Pharmacy Resident  Pager: [phone] 08/31/2016

## 2016-09-05 DIAGNOSIS — Z9221 Personal history of antineoplastic chemotherapy: Secondary | ICD-10-CM

## 2016-09-05 HISTORY — DX: Personal history of antineoplastic chemotherapy: Z92.21

## 2016-09-08 ENCOUNTER — Encounter: Payer: Self-pay | Admitting: *Deleted

## 2016-09-08 ENCOUNTER — Telehealth: Payer: Self-pay | Admitting: *Deleted

## 2016-09-08 NOTE — Telephone Encounter (Signed)
Message for patient to call the office.   Per Markham Jordan at Dr. Nino Parsley office, they do not see that the referral has been made.

## 2016-09-08 NOTE — Telephone Encounter (Signed)
-----   Message from Robert Bellow, MD sent at 09/08/2016  3:01 PM EST ----- Patient was to see Dr. Delana Meyer re: embolization of the tumor mass to lower the risk of bleeding. See what the status is. Thanks.

## 2016-09-09 ENCOUNTER — Telehealth (HOSPITAL_COMMUNITY): Payer: Self-pay | Admitting: Pharmacist

## 2016-09-09 NOTE — Telephone Encounter (Signed)
Called Ms. Ankney this afternoon about her new Erivedge prescription. She spoke with [facility], and they are shipping her medication today to be received Monday. She will plan to start taking it Monday evening. She was able to enroll in patient assistance.  Will follow up with her next week.   Demetrius Charity, PharmD Acute Care Pharmacy Resident  Pager: [phone] 09/09/2016

## 2016-09-12 NOTE — Telephone Encounter (Signed)
My Chart message sent from patient.   Note to Dr. Bary Castilla to see what the next step from here will be.

## 2016-09-13 ENCOUNTER — Encounter: Payer: Self-pay | Admitting: Oncology

## 2016-09-13 ENCOUNTER — Encounter: Payer: Self-pay | Admitting: Family Medicine

## 2016-09-13 ENCOUNTER — Other Ambulatory Visit: Payer: Self-pay | Admitting: Family Medicine

## 2016-09-13 DIAGNOSIS — F410 Panic disorder [episodic paroxysmal anxiety] without agoraphobia: Secondary | ICD-10-CM

## 2016-09-13 DIAGNOSIS — J4540 Moderate persistent asthma, uncomplicated: Secondary | ICD-10-CM

## 2016-09-13 MED ORDER — FLUTICASONE FUROATE-VILANTEROL 100-25 MCG/INH IN AEPB: 1.0000 | INHALATION_SPRAY | Freq: Every day | RESPIRATORY_TRACT | 5 refills | Status: DC

## 2016-09-13 MED ORDER — DIAZEPAM 5 MG PO TABS: 5.0000 mg | ORAL_TABLET | Freq: Two times a day (BID) | ORAL | 1 refills | Status: DC | PRN

## 2016-09-14 ENCOUNTER — Other Ambulatory Visit: Payer: Self-pay | Admitting: *Deleted

## 2016-09-14 ENCOUNTER — Encounter: Payer: Self-pay | Admitting: *Deleted

## 2016-09-14 NOTE — Telephone Encounter (Signed)
Patient has been scheduled for an appointment with Dr. Delana Meyer for 10-03-16 at 2 pm.

## 2016-09-16 ENCOUNTER — Telehealth (HOSPITAL_COMMUNITY): Payer: Self-pay | Admitting: Pharmacist

## 2016-09-16 NOTE — Telephone Encounter (Signed)
Oral Chemotherapy Follow Up Encounter - Erivedge  Ms. Casasola was called on 09/16/2016.  - Call Type: Follow up call #2  - Medication (how patient has been taking): Every morning  - Date medication was received from pharmacy/start date: started taking the medication 09/13/16 (it was received the day before)  - Issues with obtaining medication (cost, insurance coverage, etc.): Patient is covered through a patient foundation for three months, then will need to start to go through the [employer]. Recommended she start the process of getting the medication approved around the time she receives her last month of medication to avoid lapses in therapy.   - Adherence Techniques: Takes with other AM meds  - Number of missed doses: none  - Adverse Effects experienced: Ms. Fassler reported a little bit of dizziness and fatigue which was not very severe and has resolved. Discussed taking the medication at night if fatigue returns or gets worse. She reports a good appetite thus far.   Ms. Saca understands to call with any oral chemo-related questions. I will follow up with her in 1-2 weeks to assess for any issues or side effects.   Demetrius Charity, PharmD Acute Care Pharmacy Resident  Pager: [phone] 09/16/2016

## 2016-09-25 NOTE — Progress Notes (Signed)
[place]  [phone] [phone]  ID: Patricia Galloway OB: 1951/05/30  MR#: [id number]  [id number]  Patient Care Team: [name], MD as PCP - General (Family Medicine) [name], MD as Attending Physician (Family Medicine) Robert Bellow, MD (General Surgery)  CHIEF COMPLAINT: Iron deficiency anemia, locally advanced basal cell carcinoma of the right leg.  INTERVAL HISTORY: Patient returns to clinic today for repeat laboratory work, further evaluation, and consideration of blood transfusion. Since initiating Erivedge approximately 2 weeks ago she has noticed a significant increase in fatigue. She has had mild nausea. Her last significant bleed from her leg was approximately 2 weeks ago. She feels her mass is smaller and she has less pain. She has no neurologic complaints. She denies any recent fevers. She has good appetite, but has some mild weight loss. She has no chest pain or shortness of breath. She denies any vomiting, constipation, or diarrhea. She has no urinary complaints. Patient offers no further specific complaints today.  REVIEW OF SYSTEMS:   Review of Systems  Constitutional: Positive for malaise/fatigue and weight loss. Negative for fever.  Respiratory: Negative.  Negative for cough and shortness of breath.   Cardiovascular: Negative.  Negative for chest pain and leg swelling.  Gastrointestinal: Positive for nausea. Negative for abdominal pain and vomiting.  Genitourinary: Negative.   Musculoskeletal:       Right leg mass with associated pain and bleeding.  Neurological: Negative.  Negative for sensory change, focal weakness and weakness.  Psychiatric/Behavioral: Negative.  The patient is not nervous/anxious.     As per HPI. Otherwise, a complete review of systems is negative.  PAST MEDICAL HISTORY: Past Medical History:  Diagnosis Date  . Asthma    as child , but no episodes in over 30 years  . Cancer [facility])       PAST SURGICAL HISTORY: Past Surgical History:  Procedure Laterality Date  . COLON SURGERY  2002   colostomy for 10 months after a perfurated sigmoid   . COLOSTOMY REVERSAL  2003  . etopic  [id number]    FAMILY HISTORY: Family History  Problem Relation Age of Onset  . Dementia Mother   . Aortic stenosis Mother   . Osteoporosis Mother   . Dementia Father   . Diabetes Father   . Hyperlipidemia Father   . Hypertension Father   . Aneurysm Sister   . Hyperlipidemia Sister   . Hypertension Sister   . Diabetes Sister     ADVANCED DIRECTIVES (Y/N):  N  HEALTH MAINTENANCE: Social History  Substance Use Topics  . Smoking status: Former Smoker    Packs/day: 0.50    Years: 25.00    Types: Cigarettes    Quit date: 08/03/1997  . Smokeless tobacco: Never Used  . Alcohol use No     Colonoscopy:  PAP:  Bone density:  Lipid panel:  Allergies  Allergen Reactions  . Codeine   . Penicillins   . Sulfur Other (See Comments)     Patient stated redness, discomfort, and made skin bleed    Current Outpatient Prescriptions  Medication Sig Dispense Refill  . albuterol (PROVENTIL HFA;VENTOLIN HFA) 108 (90 Base) MCG/ACT inhaler Inhale 2 puffs into the lungs every 6 (six) hours as needed for wheezing or shortness of breath. 1 Inhaler 1  . Ascorbic Acid (VITAMIN C) 100 MG tablet Take 100 mg by mouth daily.    . Cholecalciferol (VITAMIN D3) 1000 units CAPS Take by mouth.    . co-enzyme  Q-10 30 MG capsule Take 30 mg by mouth 3 (three) times daily.    . diazepam (VALIUM) 5 MG tablet Take 1 tablet (5 mg total) by mouth every 12 (twelve) hours as needed for anxiety. 30 tablet 1  . fluticasone furoate-vilanterol (BREO ELLIPTA) 100-25 MCG/INH AEPB Inhale 1 puff into the lungs daily. 60 each 5  . magnesium 30 MG tablet Take 30 mg by mouth 2 (two) times daily.    . Multiple Vitamin (MULTIVITAMIN) capsule Take 1 capsule by mouth daily.    . vismodegib (ERIVEDGE) 150 MG capsule Take 1 capsule  (150 mg total) by mouth daily. 30 capsule 2  . vitamin E 100 UNIT capsule Take 5,000 Units by mouth daily.      No current facility-administered medications for this visit.    Facility-Administered Medications Ordered in Other Visits  Medication Dose Route Frequency Provider Last Rate Last Dose  . 0.9 %  sodium chloride infusion  250 mL Intravenous Once Lloyd Huger, MD      . acetaminophen (TYLENOL) tablet 650 mg  650 mg Oral Once Lloyd Huger, MD      . diphenhydrAMINE (BENADRYL) injection 25 mg  25 mg Intravenous Once Lloyd Huger, MD        OBJECTIVE: Vitals:   09/26/16 0921  BP: (!) 192/83  Pulse: 97  Temp: 97.8 F (36.6 C)     Body mass index is 18.09 kg/m.    ECOG FS:0 - Asymptomatic  General: Well-developed, well-nourished, no acute distress. Eyes: Pink conjunctiva, anicteric sclera. HEENT: Normocephalic, moist mucous membranes, clear oropharnyx. Lungs: Clear to auscultation bilaterally. Heart: Regular rate and rhythm. No rubs, murmurs, or gallops. Abdomen: Soft, nontender, nondistended. No organomegaly noted, normoactive bowel sounds. Musculoskeletal: No edema, cyanosis, or clubbing. Neuro: Alert, answering all questions appropriately. Cranial nerves grossly intact. Skin: Large 6 cm mass noted on right anterior medial leg, essentially unchanged. Psych: Normal affect.   LAB RESULTS:  Lab Results  Component Value Date   NA 134 (L) 09/26/2016   K 3.8 09/26/2016   CL 102 09/26/2016   CO2 25 09/26/2016   GLUCOSE 104 (H) 09/26/2016   BUN 18 09/26/2016   CREATININE 0.53 09/26/2016   CALCIUM 9.1 09/26/2016   PROT 7.3 09/26/2016   ALBUMIN 4.3 09/26/2016   AST 22 09/26/2016   ALT 14 09/26/2016   ALKPHOS 59 09/26/2016   BILITOT 0.3 09/26/2016   GFRNONAA >60 09/26/2016   GFRAA >60 09/26/2016    Lab Results  Component Value Date   WBC 7.0 09/26/2016   NEUTROABS 4.5 09/26/2016   HGB 6.9 (L) 09/26/2016   HCT 22.2 (L) 09/26/2016   MCV 68.7 (L)  09/26/2016   PLT 422 09/26/2016   Lab Results  Component Value Date   IRON 10 (L) 08/16/2016   TIBC 434 08/16/2016   IRONPCTSAT 2 (L) 08/16/2016   Lab Results  Component Value Date   FERRITIN 5 (L) 08/16/2016    STUDIES: No results found.  ASSESSMENT: Iron deficiency anemia, locally advanced basal cell carcinoma of the right leg.  PLAN:    1. Iron deficiency anemia: Patient's hemoglobin has declined again and is below 7.0, therefore she will receive one unit of packed red blood cells today. Her iron deficiency anemia is likely secondary to bleeding from her leg mass. Case has been discussed with vascular surgery with plans to embolize mass in order to reduce bleeding as well as possibly to decrease the size of the basal cell carcinoma.  This appointment is on October 03, 2016. 2. Basal cell carcinoma of the right leg: Patient states this lesion has been growing for 9 years and she did not seek medical attention. Diagnosis confirmed by biopsy from surgery. CT scan results reviewed independently and reported no obvious metastatic disease. Case discussed with vascular surgery as above with plans to embolize the next 1-2 weeks to reduce blood flow to lesion. Patient was also was initiated on a hedgehog inhibitor, vismodegib (Erivedge) 150 mg daily approximately 2 weeks ago. Return to clinic in 2 weeks for laboratory work and consideration of blood and then in 1 month for further evaluation.  3. Hypertension: Patient's blood pressure is significantly elevated today. She does not appear to be on hypertensive medications. Patient states her blood pressure is typically normal and attributes it to "white coat syndrome". Monitor. 4. Fatigue: Likely secondary to Erivedge, monitor. 5. Nausea: Continue current symptomatic treatment.  Approximately 30 minutes was spent in discussion of which greater than 50% was consultation.  Patient expressed understanding and was in agreement with this plan. She  also understands that She can call clinic at any time with any questions, concerns, or complaints.   Cancer Staging No matching staging information was found for the patient.  Lloyd Huger, MD   09/26/2016 10:39 AM

## 2016-09-26 ENCOUNTER — Inpatient Hospital Stay: Payer: Medicare Other | Attending: Oncology | Admitting: Oncology

## 2016-09-26 ENCOUNTER — Inpatient Hospital Stay: Payer: Medicare Other

## 2016-09-26 VITALS — BP 192/83 | HR 97 | Temp 97.8°F | Wt 105.4 lb

## 2016-09-26 DIAGNOSIS — R110 Nausea: Secondary | ICD-10-CM | POA: Insufficient documentation

## 2016-09-26 DIAGNOSIS — I10 Essential (primary) hypertension: Secondary | ICD-10-CM | POA: Diagnosis not present

## 2016-09-26 DIAGNOSIS — Z87891 Personal history of nicotine dependence: Secondary | ICD-10-CM | POA: Insufficient documentation

## 2016-09-26 DIAGNOSIS — D500 Iron deficiency anemia secondary to blood loss (chronic): Secondary | ICD-10-CM

## 2016-09-26 DIAGNOSIS — Z79899 Other long term (current) drug therapy: Secondary | ICD-10-CM | POA: Diagnosis not present

## 2016-09-26 DIAGNOSIS — D509 Iron deficiency anemia, unspecified: Secondary | ICD-10-CM | POA: Diagnosis not present

## 2016-09-26 DIAGNOSIS — R634 Abnormal weight loss: Secondary | ICD-10-CM

## 2016-09-26 DIAGNOSIS — C44712 Basal cell carcinoma of skin of right lower limb, including hip: Secondary | ICD-10-CM | POA: Insufficient documentation

## 2016-09-26 LAB — COMPREHENSIVE METABOLIC PANEL
ALBUMIN: 4.3 g/dL (ref 3.5–5.0)
ALT: 14 U/L (ref 14–54)
ANION GAP: 7 (ref 5–15)
AST: 22 U/L (ref 15–41)
Alkaline Phosphatase: 59 U/L (ref 38–126)
BUN: 18 mg/dL (ref 6–20)
CHLORIDE: 102 mmol/L (ref 101–111)
CO2: 25 mmol/L (ref 22–32)
Calcium: 9.1 mg/dL (ref 8.9–10.3)
Creatinine, Ser: 0.53 mg/dL (ref 0.44–1.00)
GFR calc non Af Amer: >60 mL/min (ref >60)
Glucose, Bld: 104 mg/dL — ABNORMAL HIGH (ref 65–99)
POTASSIUM: 3.8 mmol/L (ref 3.5–5.1)
SODIUM: 134 mmol/L — AB (ref 135–145)
Total Bilirubin: 0.3 mg/dL (ref 0.3–1.2)
Total Protein: 7.3 g/dL (ref 6.5–8.1)

## 2016-09-26 LAB — CBC WITH DIFFERENTIAL/PLATELET
BASOS ABS: 0 10*3/uL (ref 0–0.1)
BASOS PCT: 0 %
EOS ABS: 0.6 10*3/uL (ref 0–0.7)
Eosinophils Relative: 8 %
HCT: 22.2 % — ABNORMAL LOW (ref 35.0–47.0)
Hemoglobin: 6.9 g/dL — ABNORMAL LOW (ref 12.0–16.0)
Lymphocytes Relative: 19 %
Lymphs Abs: 1.3 10*3/uL (ref 1.0–3.6)
MCH: 21.3 pg — AB (ref 26.0–34.0)
MCHC: 30.9 g/dL — AB (ref 32.0–36.0)
MCV: 68.7 fL — ABNORMAL LOW (ref 80.0–100.0)
MONO ABS: 0.6 10*3/uL (ref 0.2–0.9)
Monocytes Relative: 8 %
NEUTROS ABS: 4.5 10*3/uL (ref 1.4–6.5)
NEUTROS PCT: 65 %
PLATELETS: 422 10*3/uL (ref 150–440)
RBC: 3.24 MIL/uL — ABNORMAL LOW (ref 3.80–5.20)
RDW: 20.7 % — ABNORMAL HIGH (ref 11.5–14.5)
WBC: 7 10*3/uL (ref 3.6–11.0)

## 2016-09-26 LAB — IRON AND TIBC
Iron: 12 ug/dL — ABNORMAL LOW (ref 28–170)
SATURATION RATIOS: 3 % — AB (ref 10.4–31.8)
TIBC: 405 ug/dL (ref 250–450)
UIBC: 393 ug/dL

## 2016-09-26 LAB — SAMPLE TO BLOOD BANK

## 2016-09-26 LAB — PREPARE RBC (CROSSMATCH)

## 2016-09-26 LAB — FERRITIN: Ferritin: 2 ng/mL — ABNORMAL LOW (ref 11–307)

## 2016-09-26 MED ORDER — DIPHENHYDRAMINE HCL 50 MG/ML IJ SOLN
25.0000 mg | Freq: Once | INTRAMUSCULAR | Status: AC
  Administered 2016-09-26: 25 mg via INTRAVENOUS
  Filled 2016-09-26: qty 1

## 2016-09-26 MED ORDER — SODIUM CHLORIDE 0.9 % IV SOLN
250.0000 mL | Freq: Once | INTRAVENOUS | Status: AC
  Administered 2016-09-26: 250 mL via INTRAVENOUS
  Filled 2016-09-26: qty 250

## 2016-09-26 MED ORDER — ACETAMINOPHEN 325 MG PO TABS: 650.0000 mg | ORAL_TABLET | Freq: Once | ORAL | Status: DC

## 2016-09-26 NOTE — Progress Notes (Signed)
Patient ambulates without assistance, brought to exam room 8.  Patient denies pain or discomfort at this time.  Vitals stable and documented.

## 2016-09-27 LAB — TYPE AND SCREEN
ABO/RH(D): A POS
ANTIBODY SCREEN: NEGATIVE
Unit division: 0

## 2016-10-03 ENCOUNTER — Ambulatory Visit (INDEPENDENT_AMBULATORY_CARE_PROVIDER_SITE_OTHER): Payer: Medicare Other | Admitting: Vascular Surgery

## 2016-10-03 ENCOUNTER — Encounter (INDEPENDENT_AMBULATORY_CARE_PROVIDER_SITE_OTHER): Payer: Self-pay | Admitting: Vascular Surgery

## 2016-10-03 VITALS — BP 234/124 | HR 115 | Resp 16 | Ht 63.0 in | Wt 102.8 lb

## 2016-10-03 DIAGNOSIS — J4540 Moderate persistent asthma, uncomplicated: Secondary | ICD-10-CM

## 2016-10-03 DIAGNOSIS — C44712 Basal cell carcinoma of skin of right lower limb, including hip: Secondary | ICD-10-CM | POA: Diagnosis not present

## 2016-10-03 DIAGNOSIS — D62 Acute posthemorrhagic anemia: Secondary | ICD-10-CM | POA: Diagnosis not present

## 2016-10-03 NOTE — Progress Notes (Signed)
MRN : [id number]  Idil Gillock is a 66 y.o. ([birth year]) female who presents with chief complaint of  Chief Complaint  Patient presents with  . New Patient (Initial Visit)  .  History of Present Illness: I am asked to evaluate the patient by Dr. Bary Castilla for evaluation of a bleeding tumor of the right thigh. The patient is a 66 year old female who is noted an enlarging right leg lesion over the past 9 years that would occasionally bleed. The bleeding had come more frequently recently. 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 She otherwise feels well and is asymptomatic. She has no neurologic complaints. She denies any recent fevers. She has good appetite and denies weight loss. She has no chest pain or shortness of breath. She denies any nausea, vomiting, constipation, or diarrhea. She has no urinary complaints. Patient otherwise feels well and offers no further specific complaints.  Current Meds  Medication Sig  . albuterol (PROVENTIL HFA;VENTOLIN HFA) 108 (90 Base) MCG/ACT inhaler Inhale 2 puffs into the lungs every 6 (six) hours as needed for wheezing or shortness of breath.  . Ascorbic Acid (VITAMIN C) 100 MG tablet Take 100 mg by mouth daily.  . Cholecalciferol (VITAMIN D3) 1000 units CAPS Take by mouth.  . co-enzyme Q-10 30 MG capsule Take 30 mg by mouth 3 (three) times daily.  . diazepam (VALIUM) 5 MG tablet Take 1 tablet (5 mg total) by mouth every 12 (twelve) hours as needed for anxiety.  . fluticasone furoate-vilanterol (BREO ELLIPTA) 100-25 MCG/INH AEPB Inhale 1 puff into the lungs daily.  . magnesium 30 MG tablet Take 30 mg by mouth 2 (two) times daily.  . Multiple Vitamin (MULTIVITAMIN) capsule Take 1 capsule by mouth daily.  . vismodegib (ERIVEDGE) 150 MG capsule Take 1 capsule (150 mg total) by mouth daily.  . vitamin E 100 UNIT capsule Take 5,000 Units by mouth daily.      Past Medical History:  Diagnosis Date  . Asthma    as child , but no episodes in over 30 years  . Cancer [facility])     Past Surgical History:  Procedure Laterality Date  . COLON SURGERY  2002   colostomy for 10 months after a perfurated sigmoid   . COLOSTOMY REVERSAL  2003  . etopic  [id number]    Social History Social History  Substance Use Topics  . Smoking status: Former Smoker    Packs/day: 0.50    Years: 25.00    Types: Cigarettes    Quit date: 08/03/1997  . Smokeless tobacco: Never Used  . Alcohol use No    Family History Family History  Problem Relation Age of Onset  . Dementia Mother   . Aortic stenosis Mother   . Osteoporosis Mother   . Dementia Father   . Diabetes Father   . Hyperlipidemia Father   . Hypertension Father   . Aneurysm Sister   . Hyperlipidemia Sister   . Hypertension Sister   . Diabetes Sister   No family history of bleeding/clotting disorders, porphyria or autoimmune disease   Allergies  Allergen Reactions  . Codeine   . Penicillins   . Sulfur Other (See Comments)     Patient stated redness, discomfort, and made skin bleed     REVIEW OF SYSTEMS (Negative unless checked)  Constitutional: [] Weight loss  [] Fever  [] Chills Cardiac: [] Chest pain   [] Chest pressure   [] Palpitations   []   Shortness of breath when laying flat   [] Shortness of breath with exertion. Vascular:  [] Pain in legs with walking   [] Pain in legs at rest  [] History of DVT   [] Phlebitis   [] Swelling in legs   [] Varicose veins   [] Non-healing ulcers Pulmonary:   [] Uses home oxygen   [] Productive cough   [] Hemoptysis   [] Wheeze  [] COPD   [] Asthma Neurologic:  [] Dizziness   [] Seizures   [] History of stroke   [] History of TIA  [] Aphasia   [] Vissual changes   [] Weakness or numbness in arm   [] Weakness or numbness in leg Musculoskeletal:   [] Joint swelling   [] Joint pain   [] Low back pain Hematologic:  [] Easy bruising  [x] Easy bleeding   [] Hypercoagulable state    [x] Anemic Gastrointestinal:  [] Diarrhea   [] Vomiting  [] Gastroesophageal reflux/heartburn   [] Difficulty swallowing. Genitourinary:  [] Chronic kidney disease   [] Difficult urination  [] Frequent urination   [] Blood in urine Skin:  [] Rashes   [x] Ulcers  Psychological:  [] History of anxiety   []  History of major depression.  Physical Examination  Vitals:   10/03/16 1402  BP: (!) 234/124  Pulse: (!) 115  Resp: 16  Weight: 102 lb 12.8 oz (46.6 kg)  Height: 5\' 3"  (1.6 m)   Body mass index is 18.21 kg/m. Gen: WD/WN, NAD Head: [place]/AT, No temporalis wasting.  Ear/Nose/Throat: Hearing grossly intact, nares w/o erythema or drainage, poor dentition Eyes: PER, EOMI, sclera nonicteric.  Neck: Supple, no masses.  No bruit or JVD.  Pulmonary:  Good air movement, clear to auscultation bilaterally, no use of accessory muscles.  Cardiac: RRR, normal S1, S2, no Murmurs. Vascular: Bleeding tumor mass right medial thigh Vessel Right Left  Radial Palpable Palpable  Ulnar Palpable Palpable  Brachial Palpable Palpable  Carotid Palpable Palpable  Femoral Palpable Palpable  Popliteal Palpable Palpable  PT Palpable Palpable  DP Palpable Palpable   Gastrointestinal: soft, non-distended. No guarding/no peritoneal signs.  Musculoskeletal: M/S 5/5 throughout.  No deformity or atrophy.  Neurologic: CN 2-12 intact. Pain and light touch intact in extremities.  Symmetrical.  Speech is fluent. Motor exam as listed above. Psychiatric: Judgment intact, Mood & affect appropriate for pt's clinical situation. Dermatologic: No rashes or ulcers noted.  No changes consistent with cellulitis. Lymph : No Cervical lymphadenopathy, no lichenification or skin changes of chronic lymphedema.  CBC Lab Results  Component Value Date   WBC 7.0 09/26/2016   HGB 6.9 (L) 09/26/2016   HCT 22.2 (L) 09/26/2016   MCV 68.7 (L) 09/26/2016   PLT 422 09/26/2016    BMET    Component Value Date/Time   NA 134 (L) 09/26/2016 0900    NA 134 (A) 08/18/2015   K 3.8 09/26/2016 0900   CL 102 09/26/2016 0900   CO2 25 09/26/2016 0900   GLUCOSE 104 (H) 09/26/2016 0900   BUN 18 09/26/2016 0900   BUN 17 08/18/2015   CREATININE 0.53 09/26/2016 0900   CREATININE 0.55 08/08/2016 1129   CALCIUM 9.1 09/26/2016 0900   GFRNONAA >60 09/26/2016 0900   GFRNONAA >89 08/08/2016 1129   GFRAA >60 09/26/2016 0900   GFRAA >89 08/08/2016 1129   Estimated Creatinine Clearance: 51.6 mL/min (by C-G formula based on SCr of 0.53 mg/dL).  COAG No results found for: INR, PROTIME  Radiology No results found.   Assessment/Plan 1. Basal cell carcinoma, leg, right The patient has a very large basal cell carcinoma of the right thigh. She has had significant bleeding from this tumor to  the point of having multiple blood transfusions. Her hemoglobin has dropped as low as 6.7. I have studied her CT angiogram and she appears to have several prominent branches feeding the tumor mass. These should be acceptable for embolization. I discussed the indications for embolization as well as the risks and benefits and the procedural techniques with the patient. All questions were answered the patient wishes to proceed with angiography and embolization.  2. Asthma, moderate persistent, poorly-controlled Continue pulmonary medications and aerosols as already ordered, these medications have been reviewed and there are no changes at this time.  3. Anemia of blood loss  The patient's hemoglobin is p.m. monitors she received 1 unit transfusion earlier this last week. She will undergo embolization as noted above in attempt to minimize further blood loss and prepare her for surgical extirpation   Hortencia Pilar, MD  10/03/2016 4:10 PM

## 2016-10-04 ENCOUNTER — Telehealth: Payer: Self-pay | Admitting: Pharmacist

## 2016-10-04 NOTE — Telephone Encounter (Signed)
Oral Chemotherapy Follow Up Encounter - Erivedge  Patricia Galloway was called on 10/04/2016.  - Call Type: Follow up call #3  - Medication (how patient has been taking): Every morning  - Date medication was received from pharmacy/start date: started taking the medication 09/13/16 (it was received the day before)  - Issues with obtaining medication (cost, insurance coverage, etc.): Patient is covered through a patient foundation for three months, then will need to start to go through the [employer]. Recommended she start the process of getting the medication approved around the time she receives her last month of medication to avoid lapses in therapy.   - Adherence Techniques: Takes with other AM meds  - Number of missed doses: none  - Adverse Effects experienced: Patricia Galloway said that she has been experiencing some fatigue and very minimal nausea (gingerale has helped). She reports a good appetite at this time.   Patricia Galloway said that she is planning to have vascular surgery next week to stop wound bleeding. She asked if she was needing to stop Erivedge prior to this. I told her there was nothing I found in the package insert regarding specific holding parameters around procedures, but that she would need to follow up with her oncologist and surgeon about this.   Patricia Galloway understands to call with any oral chemo-related questions. I will follow up with her in 1-2 weeks to assess for any issues or side effects.   Demetrius Charity, PharmD Acute Care Pharmacy Resident  Pager: [phone] 10/04/2016

## 2016-10-05 ENCOUNTER — Encounter: Payer: Self-pay | Admitting: Oncology

## 2016-10-06 ENCOUNTER — Encounter (INDEPENDENT_AMBULATORY_CARE_PROVIDER_SITE_OTHER): Payer: Self-pay

## 2016-10-06 ENCOUNTER — Encounter: Payer: Self-pay | Admitting: Oncology

## 2016-10-10 ENCOUNTER — Inpatient Hospital Stay: Payer: Medicare Other

## 2016-10-10 ENCOUNTER — Encounter
Admission: RE | Admit: 2016-10-10 | Discharge: 2016-10-10 | Disposition: A | Discharge status: Home / Self Care | Payer: Medicare Other | Source: Ambulatory Visit | Attending: Vascular Surgery | Admitting: Vascular Surgery

## 2016-10-10 ENCOUNTER — Inpatient Hospital Stay: Payer: Medicare Other | Attending: Oncology

## 2016-10-10 DIAGNOSIS — R110 Nausea: Secondary | ICD-10-CM | POA: Insufficient documentation

## 2016-10-10 DIAGNOSIS — Z79899 Other long term (current) drug therapy: Secondary | ICD-10-CM | POA: Insufficient documentation

## 2016-10-10 DIAGNOSIS — F419 Anxiety disorder, unspecified: Secondary | ICD-10-CM | POA: Diagnosis not present

## 2016-10-10 DIAGNOSIS — D509 Iron deficiency anemia, unspecified: Secondary | ICD-10-CM | POA: Insufficient documentation

## 2016-10-10 DIAGNOSIS — Z8261 Family history of arthritis: Secondary | ICD-10-CM | POA: Diagnosis not present

## 2016-10-10 DIAGNOSIS — Z880 Allergy status to penicillin: Secondary | ICD-10-CM | POA: Diagnosis not present

## 2016-10-10 DIAGNOSIS — Z882 Allergy status to sulfonamides status: Secondary | ICD-10-CM | POA: Diagnosis not present

## 2016-10-10 DIAGNOSIS — Z9889 Other specified postprocedural states: Secondary | ICD-10-CM | POA: Diagnosis not present

## 2016-10-10 DIAGNOSIS — I10 Essential (primary) hypertension: Secondary | ICD-10-CM | POA: Insufficient documentation

## 2016-10-10 DIAGNOSIS — Z87891 Personal history of nicotine dependence: Secondary | ICD-10-CM | POA: Diagnosis not present

## 2016-10-10 DIAGNOSIS — D500 Iron deficiency anemia secondary to blood loss (chronic): Secondary | ICD-10-CM

## 2016-10-10 DIAGNOSIS — D649 Anemia, unspecified: Secondary | ICD-10-CM | POA: Diagnosis present

## 2016-10-10 DIAGNOSIS — Z833 Family history of diabetes mellitus: Secondary | ICD-10-CM | POA: Diagnosis not present

## 2016-10-10 DIAGNOSIS — J4540 Moderate persistent asthma, uncomplicated: Secondary | ICD-10-CM | POA: Diagnosis not present

## 2016-10-10 DIAGNOSIS — C44712 Basal cell carcinoma of skin of right lower limb, including hip: Secondary | ICD-10-CM | POA: Diagnosis not present

## 2016-10-10 DIAGNOSIS — J45909 Unspecified asthma, uncomplicated: Secondary | ICD-10-CM | POA: Insufficient documentation

## 2016-10-10 DIAGNOSIS — D62 Acute posthemorrhagic anemia: Secondary | ICD-10-CM | POA: Diagnosis not present

## 2016-10-10 DIAGNOSIS — Z8249 Family history of ischemic heart disease and other diseases of the circulatory system: Secondary | ICD-10-CM | POA: Diagnosis not present

## 2016-10-10 DIAGNOSIS — Z885 Allergy status to narcotic agent status: Secondary | ICD-10-CM | POA: Diagnosis not present

## 2016-10-10 LAB — CBC WITH DIFFERENTIAL/PLATELET
BASOS PCT: 2 %
Basophils Absolute: 0.1 10*3/uL (ref 0–0.1)
EOS PCT: 7 %
Eosinophils Absolute: 0.5 10*3/uL (ref 0–0.7)
HEMATOCRIT: 30.1 % — AB (ref 35.0–47.0)
Hemoglobin: 9.4 g/dL — ABNORMAL LOW (ref 12.0–16.0)
LYMPHS PCT: 19 %
Lymphs Abs: 1.2 10*3/uL (ref 1.0–3.6)
MCH: 21.8 pg — ABNORMAL LOW (ref 26.0–34.0)
MCHC: 31.2 g/dL — ABNORMAL LOW (ref 32.0–36.0)
MCV: 69.8 fL — AB (ref 80.0–100.0)
Monocytes Absolute: 0.5 10*3/uL (ref 0.2–0.9)
Monocytes Relative: 8 %
NEUTROS PCT: 64 %
Neutro Abs: 4.2 10*3/uL (ref 1.4–6.5)
Platelets: 464 10*3/uL — ABNORMAL HIGH (ref 150–440)
RBC: 4.31 MIL/uL (ref 3.80–5.20)
RDW: 22.8 % — ABNORMAL HIGH (ref 11.5–14.5)
WBC: 6.5 10*3/uL (ref 3.6–11.0)

## 2016-10-10 LAB — SAMPLE TO BLOOD BANK

## 2016-10-10 NOTE — OR Nursing (Signed)
Orders requested from office for procedure on 10-12-2016. Recent Labs in epic.

## 2016-10-11 ENCOUNTER — Other Ambulatory Visit: Payer: Medicare Other

## 2016-10-12 ENCOUNTER — Encounter
Admission: RE | Disposition: A | Discharge status: Home / Self Care | Payer: Self-pay | Source: Ambulatory Visit | Attending: Vascular Surgery

## 2016-10-12 ENCOUNTER — Ambulatory Visit
Admission: RE | Admit: 2016-10-12 | Discharge: 2016-10-12 | Disposition: A | Discharge status: Home / Self Care | Payer: Medicare Other | Source: Ambulatory Visit | Attending: Vascular Surgery | Admitting: Vascular Surgery

## 2016-10-12 ENCOUNTER — Encounter: Payer: Self-pay | Admitting: *Deleted

## 2016-10-12 DIAGNOSIS — Z9889 Other specified postprocedural states: Secondary | ICD-10-CM | POA: Diagnosis not present

## 2016-10-12 DIAGNOSIS — Z882 Allergy status to sulfonamides status: Secondary | ICD-10-CM | POA: Insufficient documentation

## 2016-10-12 DIAGNOSIS — Z833 Family history of diabetes mellitus: Secondary | ICD-10-CM | POA: Insufficient documentation

## 2016-10-12 DIAGNOSIS — Z885 Allergy status to narcotic agent status: Secondary | ICD-10-CM | POA: Insufficient documentation

## 2016-10-12 DIAGNOSIS — Z87891 Personal history of nicotine dependence: Secondary | ICD-10-CM | POA: Diagnosis not present

## 2016-10-12 DIAGNOSIS — J4540 Moderate persistent asthma, uncomplicated: Secondary | ICD-10-CM | POA: Insufficient documentation

## 2016-10-12 DIAGNOSIS — Z8249 Family history of ischemic heart disease and other diseases of the circulatory system: Secondary | ICD-10-CM | POA: Insufficient documentation

## 2016-10-12 DIAGNOSIS — D62 Acute posthemorrhagic anemia: Secondary | ICD-10-CM | POA: Insufficient documentation

## 2016-10-12 DIAGNOSIS — Z8261 Family history of arthritis: Secondary | ICD-10-CM | POA: Insufficient documentation

## 2016-10-12 DIAGNOSIS — Z880 Allergy status to penicillin: Secondary | ICD-10-CM | POA: Insufficient documentation

## 2016-10-12 DIAGNOSIS — C44712 Basal cell carcinoma of skin of right lower limb, including hip: Secondary | ICD-10-CM | POA: Diagnosis not present

## 2016-10-12 HISTORY — PX: EMBOLIZATION: CATH118239

## 2016-10-12 SURGERY — EMBOLIZATION
Anesthesia: Moderate Sedation | Laterality: Right

## 2016-10-12 MED ORDER — FENTANYL CITRATE (PF) 100 MCG/2ML IJ SOLN
INTRAMUSCULAR | Status: AC
  Filled 2016-10-12: qty 2

## 2016-10-12 MED ORDER — PHENOL 1.4 % MT LIQD
1.0000 | OROMUCOSAL | Status: DC | PRN
  Filled 2016-10-12: qty 177

## 2016-10-12 MED ORDER — HEPARIN SODIUM (PORCINE) 1000 UNIT/ML IJ SOLN
INTRAMUSCULAR | Status: DC | PRN
  Administered 2016-10-12: 1000 [IU] via INTRAVENOUS
  Administered 2016-10-12: 2000 [IU] via INTRAVENOUS

## 2016-10-12 MED ORDER — HYDROMORPHONE HCL 1 MG/ML IJ SOLN: 0.5000 mg | INTRAMUSCULAR | Status: DC | PRN

## 2016-10-12 MED ORDER — CLINDAMYCIN PHOSPHATE 300 MG/50ML IV SOLN
INTRAVENOUS | Status: AC
  Administered 2016-10-12: 12:00:00
  Filled 2016-10-12: qty 50

## 2016-10-12 MED ORDER — OXYCODONE HCL 5 MG PO TABS: 5.0000 mg | ORAL_TABLET | ORAL | Status: DC | PRN

## 2016-10-12 MED ORDER — HEPARIN SODIUM (PORCINE) 1000 UNIT/ML IJ SOLN
INTRAMUSCULAR | Status: AC
  Filled 2016-10-12: qty 1

## 2016-10-12 MED ORDER — HYDROMORPHONE HCL 1 MG/ML IJ SOLN
INTRAMUSCULAR | Status: AC
  Administered 2016-10-12: 1 mg via INTRAVENOUS
  Filled 2016-10-12: qty 1

## 2016-10-12 MED ORDER — ACETAMINOPHEN 325 MG PO TABS: 325.0000 mg | ORAL_TABLET | ORAL | Status: DC | PRN

## 2016-10-12 MED ORDER — HYDRALAZINE HCL 20 MG/ML IJ SOLN: 5.0000 mg | INTRAMUSCULAR | Status: DC | PRN

## 2016-10-12 MED ORDER — MIDAZOLAM HCL 2 MG/2ML IJ SOLN
INTRAMUSCULAR | Status: AC
  Filled 2016-10-12: qty 4

## 2016-10-12 MED ORDER — MIDAZOLAM HCL 2 MG/2ML IJ SOLN
INTRAMUSCULAR | Status: AC
  Filled 2016-10-12: qty 2

## 2016-10-12 MED ORDER — MIDAZOLAM HCL 2 MG/2ML IJ SOLN
INTRAMUSCULAR | Status: DC | PRN
  Administered 2016-10-12 (×2): 1 mg via INTRAVENOUS
  Administered 2016-10-12: 2 mg via INTRAVENOUS
  Administered 2016-10-12: 1 mg via INTRAVENOUS

## 2016-10-12 MED ORDER — DIPHENHYDRAMINE HCL 50 MG/ML IJ SOLN
INTRAMUSCULAR | Status: AC
  Filled 2016-10-12: qty 1

## 2016-10-12 MED ORDER — FENTANYL CITRATE (PF) 100 MCG/2ML IJ SOLN
INTRAMUSCULAR | Status: DC | PRN
  Administered 2016-10-12 (×5): 50 ug via INTRAVENOUS

## 2016-10-12 MED ORDER — SODIUM CHLORIDE 0.9 % IV SOLN: INTRAVENOUS | Status: DC

## 2016-10-12 MED ORDER — ACETAMINOPHEN 325 MG RE SUPP
325.0000 mg | RECTAL | Status: DC | PRN
  Filled 2016-10-12: qty 2

## 2016-10-12 MED ORDER — LIDOCAINE HCL (PF) 1 % IJ SOLN
INTRAMUSCULAR | Status: AC
  Filled 2016-10-12: qty 30

## 2016-10-12 MED ORDER — SODIUM CHLORIDE 0.9 % IV SOLN: 500.0000 mL | Freq: Once | INTRAVENOUS | Status: DC | PRN

## 2016-10-12 MED ORDER — HYDROMORPHONE HCL 1 MG/ML IJ SOLN
1.0000 mg | INTRAMUSCULAR | Status: DC | PRN
  Administered 2016-10-12: 1 mg via INTRAVENOUS

## 2016-10-12 MED ORDER — CLINDAMYCIN PHOSPHATE 300 MG/50ML IV SOLN: 300.0000 mg | Freq: Once | INTRAVENOUS | Status: DC

## 2016-10-12 MED ORDER — GUAIFENESIN-DM 100-10 MG/5ML PO SYRP: 15.0000 mL | ORAL_SOLUTION | ORAL | Status: DC | PRN

## 2016-10-12 MED ORDER — METOPROLOL TARTRATE 5 MG/5ML IV SOLN: 2.0000 mg | INTRAVENOUS | Status: DC | PRN

## 2016-10-12 MED ORDER — ONDANSETRON HCL 4 MG/2ML IJ SOLN: 4.0000 mg | Freq: Four times a day (QID) | INTRAMUSCULAR | Status: DC | PRN

## 2016-10-12 MED ORDER — CEFAZOLIN IN D5W 1 GM/50ML IV SOLN
INTRAVENOUS | Status: AC
  Filled 2016-10-12: qty 50

## 2016-10-12 MED ORDER — HEPARIN (PORCINE) IN NACL 2-0.9 UNIT/ML-% IJ SOLN
INTRAMUSCULAR | Status: AC
  Filled 2016-10-12: qty 1000

## 2016-10-12 MED ORDER — DIPHENHYDRAMINE HCL 50 MG/ML IJ SOLN
INTRAMUSCULAR | Status: DC | PRN
  Administered 2016-10-12: 25 mg via INTRAVENOUS

## 2016-10-12 MED ORDER — LABETALOL HCL 5 MG/ML IV SOLN: 10.0000 mg | INTRAVENOUS | Status: DC | PRN

## 2016-10-12 MED ORDER — ALUM & MAG HYDROXIDE-SIMETH 200-200-20 MG/5ML PO SUSP: 15.0000 mL | ORAL | Status: DC | PRN

## 2016-10-12 SURGICAL SUPPLY — 25 items
BLOCK BEAD 500-700 (Vascular Products) ×2 IMPLANT
CATH LANTERN 025 150CM 45TIP (MICROCATHETER) ×2 IMPLANT
CATH MICROCATH PRGRT 2.8F 130 (MICROCATHETER) ×1 IMPLANT
CATH RIM 65CM (CATHETERS) ×2 IMPLANT
CATH VERT 100CM (CATHETERS) ×2 IMPLANT
COIL 400 COMPLEX SOFT 2X1CM (Vascular Products) ×2 IMPLANT
COIL 400 COMPLEX SOFT 2X2CM (Vascular Products) ×2 IMPLANT
COIL 400 COMPLEX SOFT 3X5CM (Vascular Products) ×2 IMPLANT
COIL 400 COMPLEX SOFT 4X6CM (Vascular Products) ×2 IMPLANT
COIL 400 COMPLEX STD 4X15CM (Vascular Products) ×2 IMPLANT
COIL 400 COMPLEX STD 5X12CM (Vascular Products) ×2 IMPLANT
DEVICE PRESTO INFLATION (MISCELLANEOUS) ×2 IMPLANT
DEVICE STARCLOSE SE CLOSURE (Vascular Products) ×2 IMPLANT
DEVICE TORQUE (MISCELLANEOUS) ×2 IMPLANT
GLIDEWIRE ANGLED SS 035X260CM (WIRE) ×2 IMPLANT
GUIDEWIRE GOLD .018X300 (WIRE) ×2 IMPLANT
GUIDEWIRE PFTE-COATED .018X300 (WIRE) ×2 IMPLANT
HANDLE DETACHMENT COIL (MISCELLANEOUS) ×2 IMPLANT
MICROCATH PROGREAT 2.8F 130CM (MICROCATHETER) ×2
NEEDLE ENTRY 21GA 7CM ECHOTIP (NEEDLE) ×2 IMPLANT
PACK ANGIOGRAPHY (CUSTOM PROCEDURE TRAY) ×2 IMPLANT
SET INTRO CAPELLA COAXIAL (SET/KITS/TRAYS/PACK) ×2 IMPLANT
SHEATH BRITE TIP 5FRX11 (SHEATH) ×2 IMPLANT
SHEATH RAABE 6FR (SHEATH) ×2 IMPLANT
VALVE COPILOT STAT (MISCELLANEOUS) ×2 IMPLANT

## 2016-10-12 NOTE — Discharge Instructions (Signed)

## 2016-10-12 NOTE — Progress Notes (Signed)
16:15 Sat up to dress and felt pain in lt groin. Saturated the gauze with blood with some trickling down into the groin. Pressure held for 15 min and dsg. applied. Dr. Lucky Cowboy in to see the groin. To watch the site an additional  1 hr.

## 2016-10-12 NOTE — Progress Notes (Signed)
17:30 groin site soft with no bleeding. Up to BR with assist and site remains unchanged. Discharged home with husband. Husband verbalizes understanding of what to do incase of a re-bleed.

## 2016-10-12 NOTE — Op Note (Signed)
[facility]  Percutaneous Study/Intervention Procedural Note   Date of Surgery: 10/12/2016,1:55 PM  Surgeon:Adonnis Salceda, Dolores Lory   Pre-operative Diagnosis: Basal cell carcinoma right thigh; anemia requiring transfusion secondary to leading from right leg tumor  Post-operative diagnosis:  Same  Procedure(s) Performed:  1.  Right lower extremity angiography third order catheter placement with 3 additional third order catheter placement  2.  Embolization using a combination of medium PVC beats and Ruby coils 4 SFA branches   3.  *Close left common femoral    Anesthesia: Conscious sedation was administered under my direct supervision. IV Versed plus fentanyl were utilized. Continuous ECG, pulse oximetry and blood pressure was monitored throughout the entire procedure.  Conscious sedation was for 90 minutes.  Sheath: 6 [place] Rabi left common femoral  Contrast: 55 cc   Fluoroscopy Time: 17.6 minutes  Indications:  Patient presents with a large 14 cm basal cell carcinoma of the right thigh. This has caused excessive bleeding to the point her hemoglobin has dropped to less then 7. She has required several units of transfusion. Currently she is undergoing chemotherapy and the plan is for embolization of the feeding vessels to stop the hemorrhage and reduce the blood loss at the time of surgical excision. The risks and benefits of been reviewed with the patient all questions been answered patient agrees to proceed.  Procedure:  Patricia Galloway a 66 y.o. female who was identified and appropriate procedural time out was performed.  The patient was then placed supine on the table and prepped and draped in the usual sterile fashion.  Ultrasound was used to evaluate the left common femoral artery.  It was patent .  A digital ultrasound image was acquired.  Amicropuncture needle was used to access the left common femoral artery under direct ultrasound guidance and a permanent image  wassaved for the record.microwire was then advanced under fluoroscopic guidance followed by micro-sheath.  A 0.035 J wire was advanced without resistance and a 5Fr sheath was placed.    A stiff angle Glidewire and rim catheter were then used to cross the aortic bifurcation and the wire and catheter combination were negotiated down into the right SFA. Hand injection of contrast was then performed demonstrating the proper position. The stiff angle Glidewire was reintroduced and a 6 [place] Rabi sheath was then advanced up and over the bifurcation and positioned with its tip in the SFA. The tumor is easily visualized under fluoroscopy and this area was magnified and then hand injection contrast through down the SFA was performed which localized the feeding arteries to the tumor.  Beginning proximally the artery was then selected using a combination of the angled Glidewire with a KMP catheter plus/minus a  Prograde catheter area.  As noted the proximal branch was selected small hand injection was made to verify proper positioning the prograde catheter was then advanced. Initially 1 cc of medium 500-700  PVC beats was placed and then a 2 x 2 and a 2 x 1 followed by a 3 x 5 Ruby coil was deployed. The catheters were pulled back into the SFA and injection through the sheath was used to verify adequate treatment. Attention was then turned to the next feeding vessel several centimeters distal in the SFA and this was engaged in a similar fashion 1-1/2 cc of beads was instilled through the prograde and then a 4 x 6 Ruby coil was placed. Catheters were pulled back into the SFA and again hand injection through the  sheath demonstrated excellent treatment of that branch. The next branch was then selected a cc of beads was placed followed by a 5 mm x 12 cm Ruby coil. Catheter is pulled back and verification of adequate treatment was made. The last branch was then selected with a KMP catheter as described above prograde was  advanced hand injection contrast was performed and then a cc of beads was placed followed by a 4 x 15 Ruby coil area the KMP and prograde catheters were removed and hand injection contrast was then performed beginning in the proximal SFA all the way down to the foot. SFA is widely patent as is the popliteal. Trifurcation is widely patent. The posterior tibial is quite small and appears to occlude near the ankle peroneal is quite small but does remain patent down to the ankle and collateralizes to the foot the anterior tibial is the dominant vessel to the foot it is normal size and widely patent. Rather then crossed the ankle as the dorsalis pedis the anterior tibial branches medially and becomes the medial and lateral plantar vessels. This is likely why the posterior tibial artery is occluded as it courses distally she has an anatomical variant with the distal posterior tibial filling via the anterior tibial. Dorsalis pedis is nonvisualized.  SUMMARY: Successful embolization of 4 feeding branches to the tumor with a dramatic reduction in the overall blood flow to the basal cell carcinoma as intact.   Disposition: Patient was taken to the recovery room in stable condition having tolerated the procedure well.  Patricia Galloway 10/12/2016,1:55 PM

## 2016-10-12 NOTE — H&P (Signed)
[facility] History & Physical Update  The patient was interviewed and re-examined.  The patient's previous History and Physical has been reviewed and is unchanged.  There is no change in the plan of care. We plan to proceed with the scheduled procedure.  Hortencia Pilar, MD  10/12/2016, 11:47 AM

## 2016-10-13 ENCOUNTER — Encounter: Payer: Self-pay | Admitting: Vascular Surgery

## 2016-10-14 ENCOUNTER — Encounter: Payer: Self-pay | Admitting: Family Medicine

## 2016-10-18 ENCOUNTER — Encounter (INDEPENDENT_AMBULATORY_CARE_PROVIDER_SITE_OTHER): Payer: Self-pay | Admitting: Vascular Surgery

## 2016-10-23 NOTE — Progress Notes (Signed)
[place]  [phone] [phone]) [phone]  ID: Jacqlyn Krauss OB: 09/27/1950  MR#: [id number]  [id number]  Patient Care Team: [name], MD as PCP - General (Family Medicine) [name], MD as Attending Physician (Family Medicine) Robert Bellow, MD (General Surgery)  CHIEF COMPLAINT: Iron deficiency anemia, locally advanced basal cell carcinoma of the right leg.  INTERVAL HISTORY: Patient returns to clinic today for repeat laboratory work, further evaluation, and consideration of blood transfusion. She continues to take Erivedge daily without significant side effects. She has noted increase in fatigue, some mild thinning hair and occasional nausea. She recently underwent her embolization and had significant leg pain after but this is nearly resolved as well. She currently feels well. She has no neurologic complaints. She denies any recent fevers. She has good appetite, but has some mild weight loss. She has no chest pain or shortness of breath. She denies any vomiting, constipation, or diarrhea. She has no urinary complaints. Patient offers no further specific complaints today.  REVIEW OF SYSTEMS:   Review of Systems  Constitutional: Positive for malaise/fatigue and weight loss. Negative for fever.  Respiratory: Negative.  Negative for cough and shortness of breath.   Cardiovascular: Negative.  Negative for chest pain and leg swelling.  Gastrointestinal: Positive for nausea. Negative for abdominal pain and vomiting.  Genitourinary: Negative.   Musculoskeletal:       Right leg mass with associated pain and bleeding.  Neurological: Negative.  Negative for sensory change, focal weakness and weakness.  Psychiatric/Behavioral: Negative.  The patient is not nervous/anxious.     As per HPI. Otherwise, a complete review of systems is negative.  PAST MEDICAL HISTORY: Past Medical History:  Diagnosis Date  . Anxiety   . Asthma    as child ,  but no episodes in over 30 years  . Cancer [facility])     PAST SURGICAL HISTORY: Past Surgical History:  Procedure Laterality Date  . COLON SURGERY  2002   colostomy for 10 months after a perfurated sigmoid   . COLOSTOMY REVERSAL  2003  . EMBOLIZATION Right 10/12/2016   Procedure: Embolization;  Surgeon: [name], MD;  Location: [facility];  Service: Cardiovascular;  Laterality: Right;  . etopic  [id number]  . TONSILLECTOMY      FAMILY HISTORY: Family History  Problem Relation Age of Onset  . Dementia Mother   . Aortic stenosis Mother   . Osteoporosis Mother   . Dementia Father   . Diabetes Father   . Hyperlipidemia Father   . Hypertension Father   . Aneurysm Sister   . Hyperlipidemia Sister   . Hypertension Sister   . Diabetes Sister     ADVANCED DIRECTIVES (Y/N):  N  HEALTH MAINTENANCE: Social History  Substance Use Topics  . Smoking status: Former Smoker    Packs/day: 0.50    Years: 25.00    Types: Cigarettes    Quit date: 08/03/1997  . Smokeless tobacco: Never Used  . Alcohol use No     Colonoscopy:  PAP:  Bone density:  Lipid panel:  Allergies  Allergen Reactions  . Penicillins Anaphylaxis  . Codeine Nausea And Vomiting    Percocet is ok to take  . Sulfur Other (See Comments)     Patient stated redness, discomfort, and made skin bleed  . Tape Other (See Comments)    Burns and peels    Current Outpatient Prescriptions  Medication Sig Dispense Refill  . albuterol (PROVENTIL HFA;VENTOLIN HFA)  108 (90 Base) MCG/ACT inhaler Inhale 2 puffs into the lungs every 6 (six) hours as needed for wheezing or shortness of breath. 1 Inhaler 1  . Ascorbic Acid (VITAMIN C) 100 MG tablet Take 800 mg by mouth daily.     . Cholecalciferol (VITAMIN D3) 1000 units CAPS Take 1,000 Units by mouth daily.     . Coenzyme Q10 100 MG TABS Take 1 capsule by mouth 2 (two) times daily.     . diazepam (VALIUM) 5 MG tablet Take 1 tablet (5 mg total) by mouth every 12  (twelve) hours as needed for anxiety. 30 tablet 1  . Digestive Enzyme CAPS Take 1 capsule by mouth 3 (three) times daily with meals.    . fluticasone furoate-vilanterol (BREO ELLIPTA) 100-25 MCG/INH AEPB Inhale 1 puff into the lungs daily. (Patient taking differently: Inhale 1 puff into the lungs daily as needed. ) 60 each 5  . Ginkgo Biloba Extract (GNP GINGKO BILOBA EXTRACT) 60 MG CAPS Take 1 capsule by mouth daily.    . magnesium 30 MG tablet Take 30 mg by mouth 2 (two) times daily.    . Multiple Vitamin (MULTIVITAMIN) capsule Take 2 capsules by mouth daily.     Marland Kitchen OVER THE COUNTER MEDICATION Take 1 tablet by mouth 2 (two) times daily. Mushroom Stamets 7    . Probiotic Product (PROBIOTIC DAILY PO) Take 3 capsules by mouth at bedtime. 60 Billion = 3 caps    . Spirulina 500 MG TABS Take 1 tablet by mouth every morning.    . vismodegib (ERIVEDGE) 150 MG capsule Take 1 capsule (150 mg total) by mouth daily. 30 capsule 2   No current facility-administered medications for this visit.    Facility-Administered Medications Ordered in Other Visits  Medication Dose Route Frequency Provider Last Rate Last Dose  . ferumoxytol (FERAHEME) 510 mg in sodium chloride 0.9 % 100 mL IVPB  510 mg Intravenous Once Lloyd Huger, MD        OBJECTIVE: Vitals:   10/24/16 0911  BP: (!) 180/94  Pulse: 92  Resp: 18  Temp: 98.7 F (37.1 C)     Body mass index is 18.47 kg/m.    ECOG FS:0 - Asymptomatic  General: Well-developed, well-nourished, no acute distress. Eyes: Pink conjunctiva, anicteric sclera. HEENT: Normocephalic, moist mucous membranes, clear oropharnyx. Lungs: Clear to auscultation bilaterally. Heart: Regular rate and rhythm. No rubs, murmurs, or gallops. Abdomen: Soft, nontender, nondistended. No organomegaly noted, normoactive bowel sounds. Musculoskeletal: No edema, cyanosis, or clubbing. Neuro: Alert, answering all questions appropriately. Cranial nerves grossly intact. Skin: Decrease  in size of mass on right anterior medial leg, also noted is healthy granulation tissue on the more medial aspect of tumor. Psych: Normal affect.   LAB RESULTS:  Lab Results  Component Value Date   NA 135 10/24/2016   K 3.9 10/24/2016   CL 101 10/24/2016   CO2 25 10/24/2016   GLUCOSE 91 10/24/2016   BUN 18 10/24/2016   CREATININE 0.59 10/24/2016   CALCIUM 9.3 10/24/2016   PROT 7.5 10/24/2016   ALBUMIN 4.2 10/24/2016   AST 21 10/24/2016   ALT 17 10/24/2016   ALKPHOS 71 10/24/2016   BILITOT 0.4 10/24/2016   GFRNONAA >60 10/24/2016   GFRAA >60 10/24/2016    Lab Results  Component Value Date   WBC 7.2 10/24/2016   NEUTROABS 4.0 10/24/2016   HGB 9.7 (L) 10/24/2016   HCT 30.1 (L) 10/24/2016   MCV 67.5 (L) 10/24/2016   PLT  426 10/24/2016   Lab Results  Component Value Date   IRON 19 (L) 10/24/2016   TIBC 391 10/24/2016   IRONPCTSAT 5 (L) 10/24/2016   Lab Results  Component Value Date   FERRITIN 6 (L) 10/24/2016    STUDIES: No results found.  ASSESSMENT: Iron deficiency anemia, locally advanced basal cell carcinoma of the right leg.  PLAN:    1. Iron deficiency anemia: Patient's hemoglobin has Improved and is stable at 9.7. She does not require blood transfusion today, but will proceed with 510 mg IV Feraheme today. She will return to clinic in 1 week for a second infusion.  2. Basal cell carcinoma of the right leg: Patient states this lesion has been growing for 9 years and she did not seek medical attention. Diagnosis confirmed by biopsy from surgery. CT scan results reviewed independently and reported no obvious metastatic disease. Patient recently underwent embolization. She has had significant improvement of the size of her tumor with a combination of a hedgehog inhibitor, vismodegib (Erivedge) 150 mg daily and the embolization. Return to clinic in 3 weeks for further evaluation.  3. Hypertension: Patient's blood pressure is significantly elevated today. She does  not appear to be on hypertensive medications. Patient states her blood pressure is typically normal and attributes it to "white coat syndrome". Monitor. 4. Fatigue: Likely secondary to Erivedge, monitor. 5. Nausea: Continue current symptomatic treatment.  Approximately 30 minutes was spent in discussion of which greater than 50% was consultation.  Patient expressed understanding and was in agreement with this plan. She also understands that She can call clinic at any time with any questions, concerns, or complaints.   Cancer Staging Basal cell carcinoma, leg, right Staging form: Melanoma of the Skin, AJCC 7th Edition - Clinical: No stage assigned - Unsigned   Lloyd Huger, MD   10/24/2016 10:44 AM

## 2016-10-24 ENCOUNTER — Inpatient Hospital Stay (HOSPITAL_BASED_OUTPATIENT_CLINIC_OR_DEPARTMENT_OTHER): Payer: Medicare Other | Admitting: Oncology

## 2016-10-24 ENCOUNTER — Inpatient Hospital Stay: Payer: Medicare Other

## 2016-10-24 ENCOUNTER — Encounter: Payer: Self-pay | Admitting: Oncology

## 2016-10-24 VITALS — BP 169/73 | HR 85 | Temp 96.8°F | Resp 18

## 2016-10-24 VITALS — BP 180/94 | HR 92 | Temp 98.7°F | Resp 18 | Ht 63.0 in | Wt 104.3 lb

## 2016-10-24 DIAGNOSIS — J45909 Unspecified asthma, uncomplicated: Secondary | ICD-10-CM | POA: Diagnosis not present

## 2016-10-24 DIAGNOSIS — Z87891 Personal history of nicotine dependence: Secondary | ICD-10-CM | POA: Diagnosis not present

## 2016-10-24 DIAGNOSIS — Z79899 Other long term (current) drug therapy: Secondary | ICD-10-CM | POA: Diagnosis not present

## 2016-10-24 DIAGNOSIS — D509 Iron deficiency anemia, unspecified: Secondary | ICD-10-CM

## 2016-10-24 DIAGNOSIS — C44712 Basal cell carcinoma of skin of right lower limb, including hip: Secondary | ICD-10-CM

## 2016-10-24 DIAGNOSIS — I10 Essential (primary) hypertension: Secondary | ICD-10-CM

## 2016-10-24 DIAGNOSIS — R110 Nausea: Secondary | ICD-10-CM | POA: Diagnosis not present

## 2016-10-24 DIAGNOSIS — D500 Iron deficiency anemia secondary to blood loss (chronic): Secondary | ICD-10-CM

## 2016-10-24 LAB — COMPREHENSIVE METABOLIC PANEL
ALBUMIN: 4.2 g/dL (ref 3.5–5.0)
ALK PHOS: 71 U/L (ref 38–126)
ALT: 17 U/L (ref 14–54)
AST: 21 U/L (ref 15–41)
Anion gap: 9 (ref 5–15)
BUN: 18 mg/dL (ref 6–20)
CHLORIDE: 101 mmol/L (ref 101–111)
CO2: 25 mmol/L (ref 22–32)
CREATININE: 0.59 mg/dL (ref 0.44–1.00)
Calcium: 9.3 mg/dL (ref 8.9–10.3)
GFR calc non Af Amer: >60 mL/min (ref >60)
GLUCOSE: 91 mg/dL (ref 65–99)
Potassium: 3.9 mmol/L (ref 3.5–5.1)
SODIUM: 135 mmol/L (ref 135–145)
Total Bilirubin: 0.4 mg/dL (ref 0.3–1.2)
Total Protein: 7.5 g/dL (ref 6.5–8.1)

## 2016-10-24 LAB — CBC WITH DIFFERENTIAL/PLATELET
BASOS ABS: 0.1 10*3/uL (ref 0–0.1)
BASOS PCT: 1 %
EOS ABS: 0.4 10*3/uL (ref 0–0.7)
Eosinophils Relative: 6 %
HCT: 30.1 % — ABNORMAL LOW (ref 35.0–47.0)
Hemoglobin: 9.7 g/dL — ABNORMAL LOW (ref 12.0–16.0)
Lymphocytes Relative: 25 %
Lymphs Abs: 1.8 10*3/uL (ref 1.0–3.6)
MCH: 21.7 pg — ABNORMAL LOW (ref 26.0–34.0)
MCHC: 32.1 g/dL (ref 32.0–36.0)
MCV: 67.5 fL — ABNORMAL LOW (ref 80.0–100.0)
MONO ABS: 0.9 10*3/uL (ref 0.2–0.9)
Monocytes Relative: 13 %
NEUTROS PCT: 55 %
Neutro Abs: 4 10*3/uL (ref 1.4–6.5)
PLATELETS: 426 10*3/uL (ref 150–440)
RBC: 4.46 MIL/uL (ref 3.80–5.20)
RDW: 22.4 % — AB (ref 11.5–14.5)
WBC: 7.2 10*3/uL (ref 3.6–11.0)

## 2016-10-24 LAB — SAMPLE TO BLOOD BANK

## 2016-10-24 LAB — IRON AND TIBC
Iron: 19 ug/dL — ABNORMAL LOW (ref 28–170)
SATURATION RATIOS: 5 % — AB (ref 10.4–31.8)
TIBC: 391 ug/dL (ref 250–450)
UIBC: 372 ug/dL

## 2016-10-24 LAB — FERRITIN: FERRITIN: 6 ng/mL — AB (ref 11–307)

## 2016-10-24 MED ORDER — SODIUM CHLORIDE 0.9 % IV SOLN
Freq: Once | INTRAVENOUS | Status: AC
  Administered 2016-10-24: 10:00:00 via INTRAVENOUS
  Filled 2016-10-24: qty 1000

## 2016-10-24 MED ORDER — SODIUM CHLORIDE 0.9 % IV SOLN
510.0000 mg | Freq: Once | INTRAVENOUS | Status: AC
  Administered 2016-10-24: 510 mg via INTRAVENOUS
  Filled 2016-10-24: qty 17

## 2016-10-24 NOTE — Patient Instructions (Signed)
Ferumoxytol injection What is this medicine? FERUMOXYTOL is an iron complex. Iron is used to make healthy red blood cells, which carry oxygen and nutrients throughout the body. This medicine is used to treat iron deficiency anemia in people with chronic kidney disease. COMMON BRAND NAME(S): Feraheme What should I tell my health care provider before I take this medicine? They need to know if you have any of these conditions: -anemia not caused by low iron levels -high levels of iron in the blood -magnetic resonance imaging (MRI) test scheduled -an unusual or allergic reaction to iron, other medicines, foods, dyes, or preservatives -pregnant or trying to get pregnant -breast-feeding How should I use this medicine? This medicine is for injection into a vein. It is given by a health care professional in a hospital or clinic setting. Talk to your pediatrician regarding the use of this medicine in children. Special care may be needed. What if I miss a dose? It is important not to miss your dose. Call your doctor or health care professional if you are unable to keep an appointment. What may interact with this medicine? This medicine may interact with the following medications: -other iron products What should I watch for while using this medicine? Visit your doctor or healthcare professional regularly. Tell your doctor or healthcare professional if your symptoms do not start to get better or if they get worse. You may need blood work done while you are taking this medicine. You may need to follow a special diet. Talk to your doctor. Foods that contain iron include: whole grains/cereals, dried fruits, beans, or peas, leafy green vegetables, and organ meats (liver, kidney). What side effects may I notice from receiving this medicine? Side effects that you should report to your doctor or health care professional as soon as possible: -allergic reactions like skin rash, itching or hives, swelling of the  face, lips, or tongue -breathing problems -changes in blood pressure -feeling faint or lightheaded, falls -fever or chills -flushing, sweating, or hot feelings -swelling of the ankles or feet Side effects that usually do not require medical attention (report to your doctor or health care professional if they continue or are bothersome): -diarrhea -headache -nausea, vomiting -stomach pain Where should I keep my medicine? This drug is given in a hospital or clinic and will not be stored at home.  2017 Elsevier/Gold Standard (2015-09-24 12:41:49)  

## 2016-10-26 ENCOUNTER — Ambulatory Visit (INDEPENDENT_AMBULATORY_CARE_PROVIDER_SITE_OTHER): Payer: Medicare Other | Admitting: Vascular Surgery

## 2016-10-31 ENCOUNTER — Inpatient Hospital Stay: Payer: Medicare Other

## 2016-10-31 ENCOUNTER — Encounter: Payer: Self-pay | Admitting: Family Medicine

## 2016-10-31 ENCOUNTER — Ambulatory Visit (INDEPENDENT_AMBULATORY_CARE_PROVIDER_SITE_OTHER): Payer: Medicare Other | Admitting: Family Medicine

## 2016-10-31 VITALS — BP 146/82 | HR 96 | Temp 97.9°F | Resp 16 | Ht 63.5 in | Wt 104.2 lb

## 2016-10-31 DIAGNOSIS — Z0000 Encounter for general adult medical examination without abnormal findings: Secondary | ICD-10-CM

## 2016-10-31 DIAGNOSIS — C44712 Basal cell carcinoma of skin of right lower limb, including hip: Secondary | ICD-10-CM

## 2016-10-31 DIAGNOSIS — D500 Iron deficiency anemia secondary to blood loss (chronic): Secondary | ICD-10-CM

## 2016-10-31 DIAGNOSIS — Z1231 Encounter for screening mammogram for malignant neoplasm of breast: Secondary | ICD-10-CM

## 2016-10-31 DIAGNOSIS — Z79899 Other long term (current) drug therapy: Secondary | ICD-10-CM | POA: Diagnosis not present

## 2016-10-31 DIAGNOSIS — I712 Thoracic aortic aneurysm, without rupture, unspecified: Secondary | ICD-10-CM | POA: Insufficient documentation

## 2016-10-31 DIAGNOSIS — F325 Major depressive disorder, single episode, in full remission: Secondary | ICD-10-CM

## 2016-10-31 DIAGNOSIS — J4540 Moderate persistent asthma, uncomplicated: Secondary | ICD-10-CM | POA: Diagnosis not present

## 2016-10-31 DIAGNOSIS — K861 Other chronic pancreatitis: Secondary | ICD-10-CM | POA: Diagnosis not present

## 2016-10-31 DIAGNOSIS — I700 Atherosclerosis of aorta: Secondary | ICD-10-CM | POA: Diagnosis not present

## 2016-10-31 DIAGNOSIS — R911 Solitary pulmonary nodule: Secondary | ICD-10-CM

## 2016-10-31 DIAGNOSIS — Z23 Encounter for immunization: Secondary | ICD-10-CM | POA: Diagnosis not present

## 2016-10-31 DIAGNOSIS — J432 Centrilobular emphysema: Secondary | ICD-10-CM | POA: Diagnosis not present

## 2016-10-31 DIAGNOSIS — R030 Elevated blood-pressure reading, without diagnosis of hypertension: Secondary | ICD-10-CM

## 2016-10-31 DIAGNOSIS — I10 Essential (primary) hypertension: Secondary | ICD-10-CM | POA: Diagnosis not present

## 2016-10-31 DIAGNOSIS — D509 Iron deficiency anemia, unspecified: Secondary | ICD-10-CM | POA: Diagnosis not present

## 2016-10-31 DIAGNOSIS — Z87891 Personal history of nicotine dependence: Secondary | ICD-10-CM | POA: Diagnosis not present

## 2016-10-31 DIAGNOSIS — J45909 Unspecified asthma, uncomplicated: Secondary | ICD-10-CM | POA: Diagnosis not present

## 2016-10-31 DIAGNOSIS — R110 Nausea: Secondary | ICD-10-CM | POA: Diagnosis not present

## 2016-10-31 MED ORDER — SODIUM CHLORIDE 0.9 % IV SOLN
510.0000 mg | Freq: Once | INTRAVENOUS | Status: AC
  Administered 2016-10-31: 510 mg via INTRAVENOUS
  Filled 2016-10-31: qty 17

## 2016-10-31 MED ORDER — SODIUM CHLORIDE 0.9 % IV SOLN
Freq: Once | INTRAVENOUS | Status: AC
  Administered 2016-10-31: 14:00:00 via INTRAVENOUS
  Filled 2016-10-31: qty 1000

## 2016-10-31 NOTE — Progress Notes (Signed)
Name: Patricia Galloway   MRN: [id number]    DOB: [birth year]   Date:10/31/2016       Progress Note  Subjective  Chief Complaint  Chief Complaint  Patient presents with  . Welcome to Medicare    HPI  Functional ability/safety issues: No Issues Hearing issues: Addressed  Activities of daily living: Discussed Home safety issues: No Issues  End Of Life Planning: Offered verbal information regarding advanced directives, healthcare power of attorney.  Preventative care, Health maintenance, Preventative health measures discussed.  Preventative screenings discussed today: lab work, colonoscopy - she refuses but willing to have Colguard,  mammogram, DEXA.  Low Dose CT Chest recommended if Age 42-80 years, 30 pack-year currently smoking OR have quit w/in 15years.   Lifestyle risk factor issued reviewed: Diet, exercise, weight management, advised patient smoking is not healthy, nutrition/diet.  Preventative health measures discussed (5-10 year plan).  Reviewed and recommended vaccinations: refuses at this time - Pneumovax -refused - Prevnar  - Annual Influenza - Zostavax - Tdap   Depression screening: Done Fall risk screening: Done Discuss ADLs/IADLs: Done  Current medical providers: See HPI  Other health risk factors identified this visit: No other issues Cognitive impairment issues: None identified  All above discussed with patient. Appropriate education, counseling and referral will be made based upon the above.    Basal cell carcinoma right leg: she prefers holding off on having pap smear at this time, until lesion is healed. Taking chemo drugs, had embolization, bleeding has decreased, she has side effects of medication such as lack of taste, fatigue and weight loss  White coat HTN: she does not want medication at this time, bp improves once we recheck after rest  Iron deficiency anemia: secondary to right thigh basal cell cancer.   Major Depression in remission: doing  well, only taking Valium prn, has not filled rx written in January yet.   Asthma: using Breo every few days for wheezing, but no sob or cough at this time  Patient Active Problem List   Diagnosis Date Noted  . Iron deficiency anemia due to chronic blood loss 10/24/2016  . Basal cell carcinoma, leg, right 08/24/2016  . Hyperglycemia 08/01/2016  . Mass of leg 08/25/2015  . Chronic thoracic back pain 08/04/2015  . Panic attack 08/04/2015  . Complicated grieving 93/81/8299  . Elevated blood pressure 08/04/2015  . Moderate major depression ([place]) 08/04/2015  . Asthma, moderate persistent, poorly-controlled 08/04/2015    Past Surgical History:  Procedure Laterality Date  . COLON SURGERY  2002   colostomy for 10 months after a perfurated sigmoid   . COLOSTOMY REVERSAL  2003  . EMBOLIZATION Right 10/12/2016   Procedure: Embolization;  Surgeon: [name], MD;  Location: [facility];  Service: Cardiovascular;  Laterality: Right;  . etopic  [id number]  . TONSILLECTOMY      Family History  Problem Relation Age of Onset  . Dementia Mother   . Aortic stenosis Mother   . Osteoporosis Mother   . Dementia Father   . Diabetes Father   . Hyperlipidemia Father   . Hypertension Father   . Aneurysm Sister   . Hyperlipidemia Sister   . Hypertension Sister   . Diabetes Sister     Social History   Social History  . Marital status: Single    Spouse name: N/A  . Number of children: N/A  . Years of education: N/A   Occupational History  . Not on file.   Social History Main  Topics  . Smoking status: Former Smoker    Packs/day: 0.50    Years: 25.00    Types: Cigarettes    Quit date: 08/03/1997  . Smokeless tobacco: Never Used  . Alcohol use No  . Drug use: No  . Sexual activity: Not Currently    Birth control/ protection: Post-menopausal   Other Topics Concern  . Not on file   Social History Narrative  . No narrative on file     Current Outpatient  Prescriptions:  .  Ascorbic Acid (VITAMIN C) 100 MG tablet, Take 800 mg by mouth daily. , Disp: , Rfl:  .  Cholecalciferol (VITAMIN D3) 1000 units CAPS, Take 1,000 Units by mouth daily. , Disp: , Rfl:  .  Coenzyme Q10 100 MG TABS, Take 1 capsule by mouth 2 (two) times daily. , Disp: , Rfl:  .  diazepam (VALIUM) 5 MG tablet, Take 1 tablet (5 mg total) by mouth every 12 (twelve) hours as needed for anxiety., Disp: 30 tablet, Rfl: 1 .  Digestive Enzyme CAPS, Take 1 capsule by mouth 3 (three) times daily with meals., Disp: , Rfl:  .  fluticasone furoate-vilanterol (BREO ELLIPTA) 100-25 MCG/INH AEPB, Inhale 1 puff into the lungs daily. (Patient taking differently: Inhale 1 puff into the lungs as needed. ), Disp: 60 each, Rfl: 5 .  Ginkgo Biloba Extract (GNP GINGKO BILOBA EXTRACT) 60 MG CAPS, Take 1 capsule by mouth daily., Disp: , Rfl:  .  magnesium 30 MG tablet, Take 30 mg by mouth 2 (two) times daily., Disp: , Rfl:  .  Multiple Vitamin (MULTIVITAMIN) capsule, Take 2 capsules by mouth daily. , Disp: , Rfl:  .  OVER THE COUNTER MEDICATION, Take 1 tablet by mouth 2 (two) times daily. Mushroom Stamets 7, Disp: , Rfl:  .  Probiotic Product (PROBIOTIC DAILY PO), Take 3 capsules by mouth at bedtime. 64 Billion = 3 caps, Disp: , Rfl:  .  Spirulina 500 MG TABS, Take 1 tablet by mouth every morning., Disp: , Rfl:  .  vismodegib (ERIVEDGE) 150 MG capsule, Take 1 capsule (150 mg total) by mouth daily., Disp: 30 capsule, Rfl: 2 .  albuterol (PROVENTIL HFA;VENTOLIN HFA) 108 (90 Base) MCG/ACT inhaler, Inhale 2 puffs into the lungs every 6 (six) hours as needed for wheezing or shortness of breath. (Patient not taking: Reported on 10/31/2016), Disp: 1 Inhaler, Rfl: 1  Allergies  Allergen Reactions  . Penicillins Anaphylaxis  . Codeine Nausea And Vomiting    Percocet is ok to take  . Sulfur Other (See Comments)     Patient stated redness, discomfort, and made skin bleed  . Tape Other (See Comments)    Burns and  peels     ROS  Constitutional: Negative for fever, positive for mild weight change.  Respiratory: Negative  for cough and shortness of breath.   Cardiovascular: Negative for chest pain or palpitations.  Gastrointestinal: Negative for abdominal pain, no bowel changes.  Musculoskeletal: Positive  for gait problem ( from large mass on right thigh ) no  joint swelling.  Skin: Positive  for right thigh lesion Neurological: Negative for dizziness or headache.  No other specific complaints in a complete review of systems (except as listed in HPI above).  Objective  Vitals:   10/31/16 1126 10/31/16 1158  BP: (!) 162/88 (!) 146/82  Pulse: 96   Resp: 16   Temp: 97.9 F (36.6 C)   TempSrc: Oral   SpO2: 95%   Weight: 104 lb 3.2  oz (47.3 kg)   Height: 5' 3.5" (1.613 m)     Body mass index is 18.17 kg/m.   Visual Acuity Screening   Right eye Left eye Both eyes  Without correction: 20/30 20/30 20/25  With correction:      Physical Exam  Constitutional: Patient appears well-developed and petite No distress.  HEENT: head atraumatic, normocephalic, pupils equal and reactive to light, ears normal TM bilaterally,  neck supple, throat within normal limits Cardiovascular: Normal rate, regular rhythm and normal heart sounds.  No murmur heard. No BLE edema. Pulmonary/Chest: Effort normal and breath sounds normal. No respiratory distress. Abdominal: Soft.  There is no tenderness. Psychiatric: Patient has a normal mood and affect. behavior is normal. Judgment and thought content normal.  Recent Results (from the past 2160 hour(s))  CBC with Differential/Platelet     Status: Abnormal   Collection Time: 08/08/16 11:29 AM  Result Value Ref Range   WBC 6.3 3.8 - 10.8 K/uL   RBC 2.88 (L) 3.80 - 5.10 MIL/uL    Comment: Elliptocytes Polychromasia present (1-2/hpf) Target cells    Hemoglobin 6.1 (L) 11.7 - 15.5 g/dL    Comment: Verified by repeat analysis.   HCT 22.0 (L) 35.0 - 45.0 %   MCV  76.4 (L) 80.0 - 100.0 fL   MCH 21.2 (L) 27.0 - 33.0 pg   MCHC 27.7 (L) 32.0 - 36.0 g/dL    Comment: Verified by repeat analysis.   RDW 18.8 (H) 11.0 - 15.0 %   Platelets 486 (H) 140 - 400 K/uL   MPV 9.0 7.5 - 12.5 fL   Neutro Abs 4,284 1,500 - 7,800 cells/uL   Lymphs Abs 1,134 850 - 3,900 cells/uL   Monocytes Absolute 756 200 - 950 cells/uL   Eosinophils Absolute 63 15 - 500 cells/uL   Basophils Absolute 63 0 - 200 cells/uL   Neutrophils Relative % 68 %   Lymphocytes Relative 18 %   Monocytes Relative 12 %   Eosinophils Relative 1 %   Basophils Relative 1 %   Smear Review Criteria for review not met   COMPLETE METABOLIC PANEL WITH GFR     Status: Abnormal   Collection Time: 08/08/16 11:29 AM  Result Value Ref Range   Sodium 136 135 - 146 mmol/L   Potassium 4.0 3.5 - 5.3 mmol/L   Chloride 103 98 - 110 mmol/L   CO2 24 20 - 31 mmol/L   Glucose, Bld 107 (H) 65 - 99 mg/dL   BUN 18 7 - 25 mg/dL   Creat 0.55 0.50 - 0.99 mg/dL    Comment:   For patients > or = 66 years of age: The upper reference limit for Creatinine is approximately 13% higher for people identified as African-American.      Total Bilirubin 0.3 0.2 - 1.2 mg/dL   Alkaline Phosphatase 60 33 - 130 U/L   AST 11 10 - 35 U/L   ALT 8 6 - 29 U/L   Total Protein 6.3 6.1 - 8.1 g/dL   Albumin 4.0 3.6 - 5.1 g/dL   Calcium 8.9 8.6 - 10.4 mg/dL   GFR, Est African American >89 >=60 mL/min   GFR, Est Non African American >89 >=60 mL/min  Hemoglobin A1c     Status: None   Collection Time: 08/08/16 11:29 AM  Result Value Ref Range   Hgb A1c MFr Bld 4.5 <5.7 %    Comment:   For the purpose of screening for the presence of diabetes:   <  5.7%       Consistent with the absence of diabetes 5.7-6.4 %   Consistent with increased risk for diabetes (prediabetes) >=6.5 %     Consistent with diabetes   This assay result is consistent with a decreased risk of diabetes.   Currently, no consensus exists regarding use of hemoglobin A1c  for diagnosis of diabetes in children.   According to American Diabetes Association (ADA) guidelines, hemoglobin A1c <7.0% represents optimal control in non-pregnant diabetic patients. Different metrics may apply to specific patient populations. Standards of Medical Care in Diabetes (ADA).      Mean Plasma Glucose 82 mg/dL  Ferritin     Status: Abnormal   Collection Time: 08/16/16 11:20 AM  Result Value Ref Range   Ferritin 5 (L) 11 - 307 ng/mL  Iron and TIBC     Status: Abnormal   Collection Time: 08/16/16 11:20 AM  Result Value Ref Range   Iron 10 (L) 28 - 170 ug/dL   TIBC 434 250 - 450 ug/dL   Saturation Ratios 2 (L) 10.4 - 31.8 %   UIBC 424 ug/dL  CBC with Differential/Platelet     Status: Abnormal   Collection Time: 08/16/16 11:20 AM  Result Value Ref Range   WBC 6.0 3.6 - 11.0 K/uL   RBC 3.42 (L) 3.80 - 5.20 MIL/uL   Hemoglobin 7.3 (L) 12.0 - 16.0 g/dL   HCT 24.4 (L) 35.0 - 47.0 %   MCV 71.4 (L) 80.0 - 100.0 fL   MCH 21.3 (L) 26.0 - 34.0 pg   MCHC 29.8 (L) 32.0 - 36.0 g/dL   RDW 18.7 (H) 11.5 - 14.5 %   Platelets 436 150 - 440 K/uL   Neutrophils Relative % 65 %   Neutro Abs 3.9 1.4 - 6.5 K/uL   Lymphocytes Relative 21 %   Lymphs Abs 1.3 1.0 - 3.6 K/uL   Monocytes Relative 11 %   Monocytes Absolute 0.6 0.2 - 0.9 K/uL   Eosinophils Relative 2 %   Eosinophils Absolute 0.1 0 - 0.7 K/uL   Basophils Relative 1 %   Basophils Absolute 0.1 0 - 0.1 K/uL  Type and screen     Status: None   Collection Time: 08/16/16 11:20 AM  Result Value Ref Range   ABO/RH(D) A POS    Antibody Screen NEG    Sample Expiration 08/19/2016    Unit Number [id number]    Blood Component Type RCLI PHER 1    Unit division 00    Status of Unit ISSUED,FINAL    Transfusion Status OK TO TRANSFUSE    Crossmatch Result Compatible   ABO/Rh     Status: None   Collection Time: 08/17/16  8:59 AM  Result Value Ref Range   ABO/RH(D) A POS   Prepare RBC     Status: None   Collection Time: 08/17/16   9:30 AM  Result Value Ref Range   Order Confirmation ORDER PROCESSED BY BLOOD BANK   CBC with Differential/Platelet     Status: Abnormal   Collection Time: 08/26/16  8:47 AM  Result Value Ref Range   WBC 9.6 3.6 - 11.0 K/uL   RBC 4.56 3.80 - 5.20 MIL/uL   Hemoglobin 10.1 (L) 12.0 - 16.0 g/dL   HCT 33.2 (L) 35.0 - 47.0 %   MCV 72.8 (L) 80.0 - 100.0 fL   MCH 22.1 (L) 26.0 - 34.0 pg   MCHC 30.4 (L) 32.0 - 36.0 g/dL   RDW 20.5 (H) 11.5 -  14.5 %   Platelets 432 150 - 440 K/uL   Neutrophils Relative % 67 %   Neutro Abs 6.4 1.4 - 6.5 K/uL   Lymphocytes Relative 19 %   Lymphs Abs 1.8 1.0 - 3.6 K/uL   Monocytes Relative 10 %   Monocytes Absolute 1.0 (H) 0.2 - 0.9 K/uL   Eosinophils Relative 3 %   Eosinophils Absolute 0.3 0 - 0.7 K/uL   Basophils Relative 1 %   Basophils Absolute 0.1 0 - 0.1 K/uL  Hold Tube- Blood Bank     Status: None   Collection Time: 08/26/16  8:47 AM  Result Value Ref Range   Blood Bank Specimen SAMPLE AVAILABLE FOR TESTING    Sample Expiration 08/29/2016   Ferritin     Status: Abnormal   Collection Time: 09/26/16  9:00 AM  Result Value Ref Range   Ferritin 2 (L) 11 - 307 ng/mL  Iron and TIBC     Status: Abnormal   Collection Time: 09/26/16  9:00 AM  Result Value Ref Range   Iron 12 (L) 28 - 170 ug/dL   TIBC 405 250 - 450 ug/dL   Saturation Ratios 3 (L) 10.4 - 31.8 %   UIBC 393 ug/dL  CBC with Differential/Platelet     Status: Abnormal   Collection Time: 09/26/16  9:00 AM  Result Value Ref Range   WBC 7.0 3.6 - 11.0 K/uL   RBC 3.24 (L) 3.80 - 5.20 MIL/uL   Hemoglobin 6.9 (L) 12.0 - 16.0 g/dL   HCT 22.2 (L) 35.0 - 47.0 %   MCV 68.7 (L) 80.0 - 100.0 fL   MCH 21.3 (L) 26.0 - 34.0 pg   MCHC 30.9 (L) 32.0 - 36.0 g/dL   RDW 20.7 (H) 11.5 - 14.5 %   Platelets 422 150 - 440 K/uL   Neutrophils Relative % 65 %   Lymphocytes Relative 19 %   Monocytes Relative 8 %   Eosinophils Relative 8 %   Basophils Relative 0 %   Neutro Abs 4.5 1.4 - 6.5 K/uL   Lymphs  Abs 1.3 1.0 - 3.6 K/uL   Monocytes Absolute 0.6 0.2 - 0.9 K/uL   Eosinophils Absolute 0.6 0 - 0.7 K/uL   Basophils Absolute 0.0 0 - 0.1 K/uL   Smear Review PLATLETS VARY IN SIZE     Comment: LARGE PLATELETS PRESENT PLATELETS APPEAR ADEQUATE   Comprehensive metabolic panel     Status: Abnormal   Collection Time: 09/26/16  9:00 AM  Result Value Ref Range   Sodium 134 (L) 135 - 145 mmol/L   Potassium 3.8 3.5 - 5.1 mmol/L   Chloride 102 101 - 111 mmol/L   CO2 25 22 - 32 mmol/L   Glucose, Bld 104 (H) 65 - 99 mg/dL   BUN 18 6 - 20 mg/dL   Creatinine, Ser 0.53 0.44 - 1.00 mg/dL   Calcium 9.1 8.9 - 10.3 mg/dL   Total Protein 7.3 6.5 - 8.1 g/dL   Albumin 4.3 3.5 - 5.0 g/dL   AST 22 15 - 41 U/L   ALT 14 14 - 54 U/L   Alkaline Phosphatase 59 38 - 126 U/L   Total Bilirubin 0.3 0.3 - 1.2 mg/dL   GFR calc non Af Amer >60 >60 mL/min   GFR calc Af Amer >60 >60 mL/min    Comment: (NOTE) The eGFR has been calculated using the CKD EPI equation. This calculation has not been validated in all clinical situations. eGFR's persistently <60  mL/min signify possible Chronic Kidney Disease.    Anion gap 7 5 - 15  Hold Tube- Blood Bank     Status: None   Collection Time: 09/26/16  9:00 AM  Result Value Ref Range   Blood Bank Specimen SAMPLE AVAILABLE FOR TESTING    Sample Expiration 09/29/2016   Prepare RBC     Status: None   Collection Time: 09/26/16  9:00 AM  Result Value Ref Range   Order Confirmation ORDER PROCESSED BY BLOOD BANK   Type and screen     Status: None   Collection Time: 09/26/16  9:00 AM  Result Value Ref Range   ABO/RH(D) A POS    Antibody Screen NEG    Sample Expiration 09/29/2016    Unit Number [id number]    Blood Component Type RBC, LR IRR    Unit division 00    Status of Unit ISSUED,FINAL    Transfusion Status OK TO TRANSFUSE    Crossmatch Result Compatible   Hold Tube- Blood Bank     Status: None   Collection Time: 10/10/16  8:42 AM  Result Value Ref Range    Blood Bank Specimen SAMPLE AVAILABLE FOR TESTING    Sample Expiration 10/13/2016   CBC with Differential     Status: Abnormal   Collection Time: 10/10/16  8:42 AM  Result Value Ref Range   WBC 6.5 3.6 - 11.0 K/uL   RBC 4.31 3.80 - 5.20 MIL/uL   Hemoglobin 9.4 (L) 12.0 - 16.0 g/dL   HCT 30.1 (L) 35.0 - 47.0 %   MCV 69.8 (L) 80.0 - 100.0 fL   MCH 21.8 (L) 26.0 - 34.0 pg   MCHC 31.2 (L) 32.0 - 36.0 g/dL   RDW 22.8 (H) 11.5 - 14.5 %   Platelets 464 (H) 150 - 440 K/uL   Neutrophils Relative % 64 %   Lymphocytes Relative 19 %   Monocytes Relative 8 %   Eosinophils Relative 7 %   Basophils Relative 2 %   Neutro Abs 4.2 1.4 - 6.5 K/uL   Lymphs Abs 1.2 1.0 - 3.6 K/uL   Monocytes Absolute 0.5 0.2 - 0.9 K/uL   Eosinophils Absolute 0.5 0 - 0.7 K/uL   Basophils Absolute 0.1 0 - 0.1 K/uL   RBC Morphology MIXED RBC POPULATION     Comment: TARGET CELLS TEARDROP CELLS ELLIPTOCYTES HYPOCHROMASIA PRESENT    Smear Review SMEAR SCANNED MANUAL DIFF PERFORMED     Comment: PLATELETS APPEAR ADEQUATE  Comprehensive metabolic panel     Status: None   Collection Time: 10/24/16  8:46 AM  Result Value Ref Range   Sodium 135 135 - 145 mmol/L   Potassium 3.9 3.5 - 5.1 mmol/L   Chloride 101 101 - 111 mmol/L   CO2 25 22 - 32 mmol/L   Glucose, Bld 91 65 - 99 mg/dL   BUN 18 6 - 20 mg/dL   Creatinine, Ser 0.59 0.44 - 1.00 mg/dL   Calcium 9.3 8.9 - 10.3 mg/dL   Total Protein 7.5 6.5 - 8.1 g/dL   Albumin 4.2 3.5 - 5.0 g/dL   AST 21 15 - 41 U/L   ALT 17 14 - 54 U/L   Alkaline Phosphatase 71 38 - 126 U/L   Total Bilirubin 0.4 0.3 - 1.2 mg/dL   GFR calc non Af Amer >60 >60 mL/min   GFR calc Af Amer >60 >60 mL/min    Comment: (NOTE) The eGFR has been calculated using the CKD EPI equation.  This calculation has not been validated in all clinical situations. eGFR's persistently <60 mL/min signify possible Chronic Kidney Disease.    Anion gap 9 5 - 15  Hold Tube- Blood Bank     Status: None   Collection  Time: 10/24/16  8:46 AM  Result Value Ref Range   Blood Bank Specimen SAMPLE AVAILABLE FOR TESTING    Sample Expiration 10/27/2016   Ferritin     Status: Abnormal   Collection Time: 10/24/16  8:46 AM  Result Value Ref Range   Ferritin 6 (L) 11 - 307 ng/mL  Iron and TIBC     Status: Abnormal   Collection Time: 10/24/16  8:46 AM  Result Value Ref Range   Iron 19 (L) 28 - 170 ug/dL   TIBC 391 250 - 450 ug/dL   Saturation Ratios 5 (L) 10.4 - 31.8 %   UIBC 372 ug/dL  CBC with Differential     Status: Abnormal   Collection Time: 10/24/16  8:46 AM  Result Value Ref Range   WBC 7.2 3.6 - 11.0 K/uL   RBC 4.46 3.80 - 5.20 MIL/uL   Hemoglobin 9.7 (L) 12.0 - 16.0 g/dL   HCT 30.1 (L) 35.0 - 47.0 %   MCV 67.5 (L) 80.0 - 100.0 fL   MCH 21.7 (L) 26.0 - 34.0 pg   MCHC 32.1 32.0 - 36.0 g/dL   RDW 22.4 (H) 11.5 - 14.5 %   Platelets 426 150 - 440 K/uL   Neutrophils Relative % 55 %   Neutro Abs 4.0 1.4 - 6.5 K/uL   Lymphocytes Relative 25 %   Lymphs Abs 1.8 1.0 - 3.6 K/uL   Monocytes Relative 13 %   Monocytes Absolute 0.9 0.2 - 0.9 K/uL   Eosinophils Relative 6 %   Eosinophils Absolute 0.4 0 - 0.7 K/uL   Basophils Relative 1 %   Basophils Absolute 0.1 0 - 0.1 K/uL      PHQ2/9: Depression screen [facility] 2/9 10/31/2016 08/01/2016 03/09/2016 11/10/2015 08/04/2015  Decreased Interest 0 0 0 0 0  Down, Depressed, Hopeless 0 0 0 0 0  PHQ - 2 Score 0 0 0 0 0     Fall Risk: Fall Risk  10/31/2016 08/01/2016 03/09/2016 11/10/2015  Falls in the past year? No No No No     Functional Status Survey: Is the patient deaf or have difficulty hearing?: No Does the patient have difficulty seeing, even when wearing glasses/contacts?: No Does the patient have difficulty concentrating, remembering, or making decisions?: No Does the patient have difficulty walking or climbing stairs?: No Does the patient have difficulty dressing or bathing?: No Does the patient have difficulty doing errands alone such as visiting a  doctor's office or shopping?: No    Assessment & Plan  1. Welcome to Medicare preventive visit  Discussed importance of 150 minutes of physical activity weekly, eat two servings of fish weekly, eat one serving of tree nuts ( cashews, pistachios, pecans, almonds.Marland Kitchen) every other day, eat 6 servings of fruit/vegetables daily and drink plenty of water and avoid sweet beverages.  - EKG 12-Lead - Visual acuity screening - DG Bone Density; Future - Cologuard - MM Digital Screening; Future  2. Basal cell carcinoma, leg, right  Continue follow up with vascular surgeon, general surgeon and hematologist   3. White coat syndrome without diagnosis of hypertension  Doing better  4. Asthma, moderate persistent, poorly-controlled  Doing well   5. Encounter for screening mammogram for breast cancer  -  MM Digital Screening; Future   6. Need for shingles vaccine  refused  7. Need for pneumococcal vaccination  refused  8. Depression, major, in remission ([place])  Doing well now  9. Thoracic aortic aneurysm without rupture ([place])  Recheck next year  10. Atherosclerosis of aorta ([place])  She prefers not to take statin or aspirin at this time  11. Chronic pancreatitis, unspecified pancreatitis type ([place])   no symptoms of pancreatitis - incidental finding  12. Pulmonary nodule, left  Consider repeating in 6  monhts  13. Centrilobular emphysema ([facility])  Quit smoking 1998 - smoked for 25 years, on Breo for asthma and COPD

## 2016-11-01 ENCOUNTER — Ambulatory Visit: Payer: Medicare Other

## 2016-11-03 ENCOUNTER — Encounter (INDEPENDENT_AMBULATORY_CARE_PROVIDER_SITE_OTHER): Payer: Self-pay | Admitting: Vascular Surgery

## 2016-11-03 ENCOUNTER — Ambulatory Visit (INDEPENDENT_AMBULATORY_CARE_PROVIDER_SITE_OTHER): Payer: Medicare Other | Admitting: Vascular Surgery

## 2016-11-03 VITALS — BP 179/86 | HR 85 | Resp 16 | Ht 63.5 in | Wt 102.0 lb

## 2016-11-03 DIAGNOSIS — J432 Centrilobular emphysema: Secondary | ICD-10-CM | POA: Diagnosis not present

## 2016-11-03 DIAGNOSIS — I712 Thoracic aortic aneurysm, without rupture, unspecified: Secondary | ICD-10-CM

## 2016-11-03 DIAGNOSIS — C44712 Basal cell carcinoma of skin of right lower limb, including hip: Secondary | ICD-10-CM | POA: Diagnosis not present

## 2016-11-03 NOTE — Progress Notes (Signed)
MRN : [id number]  Patricia Galloway is a 66 y.o. (1951-02-23) female who presents with chief complaint of  Chief Complaint  Patient presents with  . Re-evaluation    2 weeks no studies  .  History of Present Illness: The patient returns to the office for followup and review status post angiogram with intervention.  At that time she had embolization of a large basal cell carcinoma of the right thigh. The patient notes a huge  improvement in the tumor with shrinkage by 90% and cessation of bleeding.  No new ulcers or wounds have occurred since the last visit no new distal symptoms.  There have been no significant changes to the patient's overall health care.  The patient denies amaurosis fugax or recent TIA symptoms. There are no recent neurological changes noted. The patient denies history of DVT, PE or superficial thrombophlebitis. The patient denies recent episodes of angina or shortness of breath.   No outpatient prescriptions have been marked as taking for the 11/03/16 encounter (Office Visit) with [name], MD.    Past Medical History:  Diagnosis Date  . Anxiety   . Asthma    as child , but no episodes in over 30 years  . Cancer [facility])     Past Surgical History:  Procedure Laterality Date  . COLON SURGERY  2002   colostomy for 10 months after a perfurated sigmoid   . COLOSTOMY REVERSAL  2003  . EMBOLIZATION Right 10/12/2016   Procedure: Embolization;  Surgeon: [name], MD;  Location: [place] [facility];  Service: Cardiovascular;  Laterality: Right;  . etopic  [id number]  . TONSILLECTOMY      Social History Social History  Substance Use Topics  . Smoking status: Former Smoker    Packs/day: 0.50    Years: 25.00    Types: Cigarettes    Quit date: 08/03/1997  . Smokeless tobacco: Never Used  . Alcohol use No    Family History Family History  Problem Relation Age of Onset  . Dementia Mother   . Aortic stenosis Mother   . Osteoporosis Mother   .  Dementia Father   . Diabetes Father   . Hyperlipidemia Father   . Hypertension Father   . Aneurysm Sister   . Hyperlipidemia Sister   . Hypertension Sister   . Diabetes Sister   No family history of bleeding/clotting disorders, porphyria or autoimmune disease  Allergies  Allergen Reactions  . Penicillins Anaphylaxis  . Codeine Nausea And Vomiting    Percocet is ok to take  . Sulfur Other (See Comments)     Patient stated redness, discomfort, and made skin bleed  . Tape Other (See Comments)    Burns and peels     REVIEW OF SYSTEMS (Negative unless checked)  Constitutional: [] Weight loss  [] Fever  [] Chills Cardiac: [] Chest pain   [] Chest pressure   [] Palpitations   [] Shortness of breath when laying flat   [] Shortness of breath with exertion. Vascular:  [] Pain in legs with walking   [] Pain in legs at rest  [] History of DVT   [] Phlebitis   [] Swelling in legs   [] Varicose veins   [] Non-healing ulcers Pulmonary:   [] Uses home oxygen   [] Productive cough   [] Hemoptysis   [] Wheeze  [] COPD   [] Asthma Neurologic:  [] Dizziness   [] Seizures   [] History of stroke   [] History of TIA  [] Aphasia   [] Vissual changes   [] Weakness or numbness in arm   [] Weakness or  numbness in leg Musculoskeletal:   [] Joint swelling   [] Joint pain   [] Low back pain Hematologic:  [] Easy bruising  [] Easy bleeding   [] Hypercoagulable state   [] Anemic Gastrointestinal:  [] Diarrhea   [] Vomiting  [] Gastroesophageal reflux/heartburn   [] Difficulty swallowing. Genitourinary:  [] Chronic kidney disease   [] Difficult urination  [] Frequent urination   [] Blood in urine Skin:  [] Rashes   [x] Ulcers  Psychological:  [] History of anxiety   []  History of major depression.  Physical Examination  Vitals:   11/03/16 1607  BP: (!) 179/86  Pulse: 85  Resp: 16  Weight: 102 lb (46.3 kg)  Height: 5' 3.5" (1.613 m)   Body mass index is 17.79 kg/m. Gen: WD/WN, NAD Head: Ava/AT, No temporalis wasting.  Ear/Nose/Throat: Hearing  grossly intact, nares w/o erythema or drainage, poor dentition Eyes: PER, EOMI, sclera nonicteric.  Neck: Supple, no masses.  No bruit or JVD.  Pulmonary:  Good air movement, clear to auscultation bilaterally, no use of accessory muscles.  Cardiac: RRR, normal S1, S2, no Murmurs. Vascular: Bleeding tumor mass right medial thigh Vessel Right Left  Radial Palpable Palpable  Ulnar Palpable Palpable  Brachial Palpable Palpable  Carotid Palpable Palpable  Femoral Palpable Palpable  Popliteal Palpable Palpable  PT Palpable Palpable  DP Palpable Palpable   Gastrointestinal: soft, non-distended. No guarding/no peritoneal signs.  Musculoskeletal: M/S 5/5 throughout.  No deformity or atrophy.  Neurologic: CN 2-12 intact. Pain and light touch intact in extremities.  Symmetrical.  Speech is fluent. Motor exam as listed above. Psychiatric: Judgment intact, Mood & affect appropriate for pt's clinical situation. Dermatologic: No rashes or ulcers noted.  No changes consistent with cellulitis. Lymph : No Cervical lymphadenopathy, no lichenification or skin changes of chronic lymphedema.  CBC Lab Results  Component Value Date   WBC 7.2 10/24/2016   HGB 9.7 (L) 10/24/2016   HCT 30.1 (L) 10/24/2016   MCV 67.5 (L) 10/24/2016   PLT 426 10/24/2016    BMET    Component Value Date/Time   NA 135 10/24/2016 0846   NA 134 (A) 08/18/2015   K 3.9 10/24/2016 0846   CL 101 10/24/2016 0846   CO2 25 10/24/2016 0846   GLUCOSE 91 10/24/2016 0846   BUN 18 10/24/2016 0846   BUN 17 08/18/2015   CREATININE 0.59 10/24/2016 0846   CREATININE 0.55 08/08/2016 1129   CALCIUM 9.3 10/24/2016 0846   GFRNONAA >60 10/24/2016 0846   GFRNONAA >89 08/08/2016 1129   GFRAA >60 10/24/2016 0846   GFRAA >89 08/08/2016 1129   Estimated Creatinine Clearance: 51.2 mL/min (by C-G formula based on SCr of 0.59 mg/dL).  COAG No results found for: INR, PROTIME  Radiology No results found.  Assessment/Plan 1. Basal cell  carcinoma, leg, right Contnue with chemotherapy per Dr Grayland Ormond and plan for surgery with Dr Bary Castilla  2. Centrilobular emphysema (HCC) Continue pulmonary medications and aerosols as already ordered, these medications have been reviewed and there are no changes at this time.   3. Thoracic aortic aneurysm without rupture ([place]) Follow annually    Hortencia Pilar, MD  11/03/2016 10:08 PM

## 2016-11-04 ENCOUNTER — Telehealth: Payer: Self-pay | Admitting: Pharmacist

## 2016-11-04 NOTE — Telephone Encounter (Signed)
Oral Chemotherapy Follow Up Encounter - Erivedge  Patricia Galloway was called on 11/04/2016 to discuss her medication Erivedge.   - Call Type: Follow up call #3  - Medication (how patient has been taking): Takes erivedge 150mg  PO around 11:00AM every day   - Issues with obtaining medication (cost, insurance coverage, etc.): No issues, but does find that she needs to be proactive in calling pharmacy for refills. Per patient, she was approved for another three month supply of medication.   - Adherence Techniques: Takes around lunch time  - Number of missed doses: none, appears to be very adherent and had good routine. She tracks number of capsules left and calls pharmacy when down to about a week supply.   - Adverse Effects experienced: is having some loss of taste and hair loss but is overall doing well with the medication. She is also having some fatigue, discussed that she could take medication at night to see if this helps. She will also be starting PT soon, and we discussed planning day to allow for rest and down time while she sees how this impacts her energy level.   Demetrius Charity, PharmD Acute Care Pharmacy Resident  Pager: [phone] 11/04/2016

## 2016-11-13 NOTE — Progress Notes (Deleted)
[place]  [phone] [phone]  ID: Patricia Galloway OB: 1951/06/08  MR#: [id number]  ALP#:[id number]  Patient Care Team: [name], MD as PCP - General (Family Medicine) [name], MD as Attending Physician (Family Medicine) Robert Bellow, MD (General Surgery)  CHIEF COMPLAINT: Iron deficiency anemia, locally advanced basal cell carcinoma of the right leg.  INTERVAL HISTORY: Patient returns to clinic today for repeat laboratory work, further evaluation, and consideration of blood transfusion. She continues to take Erivedge daily without significant side effects. She has noted increase in fatigue, some mild thinning hair and occasional nausea. She recently underwent her embolization and had significant leg pain after but this is nearly resolved as well. She currently feels well. She has no neurologic complaints. She denies any recent fevers. She has good appetite, but has some mild weight loss. She has no chest pain or shortness of breath. She denies any vomiting, constipation, or diarrhea. She has no urinary complaints. Patient offers no further specific complaints today.  REVIEW OF SYSTEMS:   Review of Systems  Constitutional: Positive for malaise/fatigue and weight loss. Negative for fever.  Respiratory: Negative.  Negative for cough and shortness of breath.   Cardiovascular: Negative.  Negative for chest pain and leg swelling.  Gastrointestinal: Positive for nausea. Negative for abdominal pain and vomiting.  Genitourinary: Negative.   Musculoskeletal:       Right leg mass with associated pain and bleeding.  Neurological: Negative.  Negative for sensory change, focal weakness and weakness.  Psychiatric/Behavioral: Negative.  The patient is not nervous/anxious.     As per HPI. Otherwise, a complete review of systems is negative.  PAST MEDICAL HISTORY: Past Medical History:  Diagnosis Date  . Anxiety   . Asthma    as child ,  but no episodes in over 30 years  . Cancer [facility]     PAST SURGICAL HISTORY: Past Surgical History:  Procedure Laterality Date  . COLON SURGERY  2002   colostomy for 10 months after a perfurated sigmoid   . COLOSTOMY REVERSAL  2003  . EMBOLIZATION Right 10/12/2016   Procedure: Embolization;  Surgeon: [name], MD;  Location: [place] [facility];  Service: Cardiovascular;  Laterality: Right;  . etopic  3532,9924  . TONSILLECTOMY      FAMILY HISTORY: Family History  Problem Relation Age of Onset  . Dementia Mother   . Aortic stenosis Mother   . Osteoporosis Mother   . Dementia Father   . Diabetes Father   . Hyperlipidemia Father   . Hypertension Father   . Aneurysm Sister   . Hyperlipidemia Sister   . Hypertension Sister   . Diabetes Sister     ADVANCED DIRECTIVES (Y/N):  N  HEALTH MAINTENANCE: Social History  Substance Use Topics  . Smoking status: Former Smoker    Packs/day: 0.50    Years: 25.00    Types: Cigarettes    Quit date: 08/03/1997  . Smokeless tobacco: Never Used  . Alcohol use No     Colonoscopy:  PAP:  Bone density:  Lipid panel:  Allergies  Allergen Reactions  . Penicillins Anaphylaxis  . Codeine Nausea And Vomiting    Percocet is ok to take  . Sulfur Other (See Comments)     Patient stated redness, discomfort, and made skin bleed  . Tape Other (See Comments)    Burns and peels    Current Outpatient Prescriptions  Medication Sig Dispense Refill  . albuterol (PROVENTIL HFA;VENTOLIN HFA)  108 (90 Base) MCG/ACT inhaler Inhale 2 puffs into the lungs every 6 (six) hours as needed for wheezing or shortness of breath. (Patient not taking: Reported on 10/31/2016) 1 Inhaler 1  . Ascorbic Acid (VITAMIN C) 100 MG tablet Take 800 mg by mouth daily.     . Cholecalciferol (VITAMIN D3) 1000 units CAPS Take 1,000 Units by mouth daily.     . Coenzyme Q10 100 MG TABS Take 1 capsule by mouth 2 (two) times daily.     . diazepam (VALIUM) 5 MG tablet  Take 1 tablet (5 mg total) by mouth every 12 (twelve) hours as needed for anxiety. 30 tablet 1  . Digestive Enzyme CAPS Take 1 capsule by mouth 3 (three) times daily with meals.    . fluticasone furoate-vilanterol (BREO ELLIPTA) 100-25 MCG/INH AEPB Inhale 1 puff into the lungs daily. (Patient taking differently: Inhale 1 puff into the lungs as needed. ) 60 each 5  . Ginkgo Biloba Extract (GNP GINGKO BILOBA EXTRACT) 60 MG CAPS Take 1 capsule by mouth daily.    . magnesium 30 MG tablet Take 30 mg by mouth 2 (two) times daily.    . Multiple Vitamin (MULTIVITAMIN) capsule Take 2 capsules by mouth daily.     Marland Kitchen OVER THE COUNTER MEDICATION Take 1 tablet by mouth 2 (two) times daily. Mushroom Stamets 7    . Probiotic Product (PROBIOTIC DAILY PO) Take 3 capsules by mouth at bedtime. 78 Billion = 3 caps    . Spirulina 500 MG TABS Take 1 tablet by mouth every morning.    . vismodegib (ERIVEDGE) 150 MG capsule Take 1 capsule (150 mg total) by mouth daily. 30 capsule 2   No current facility-administered medications for this visit.     OBJECTIVE: There were no vitals filed for this visit.   There is no height or weight on file to calculate BMI.    ECOG FS:0 - Asymptomatic  General: Well-developed, well-nourished, no acute distress. Eyes: Pink conjunctiva, anicteric sclera. HEENT: Normocephalic, moist mucous membranes, clear oropharnyx. Lungs: Clear to auscultation bilaterally. Heart: Regular rate and rhythm. No rubs, murmurs, or gallops. Abdomen: Soft, nontender, nondistended. No organomegaly noted, normoactive bowel sounds. Musculoskeletal: No edema, cyanosis, or clubbing. Neuro: Alert, answering all questions appropriately. Cranial nerves grossly intact. Skin: Decrease in size of mass on right anterior medial leg, also noted is healthy granulation tissue on the more medial aspect of tumor. Psych: Normal affect.   LAB RESULTS:  Lab Results  Component Value Date   NA 135 10/24/2016   K 3.9  10/24/2016   CL 101 10/24/2016   CO2 25 10/24/2016   GLUCOSE 91 10/24/2016   BUN 18 10/24/2016   CREATININE 0.59 10/24/2016   CALCIUM 9.3 10/24/2016   PROT 7.5 10/24/2016   ALBUMIN 4.2 10/24/2016   AST 21 10/24/2016   ALT 17 10/24/2016   ALKPHOS 71 10/24/2016   BILITOT 0.4 10/24/2016   GFRNONAA >60 10/24/2016   GFRAA >60 10/24/2016    Lab Results  Component Value Date   WBC 7.2 10/24/2016   NEUTROABS 4.0 10/24/2016   HGB 9.7 (L) 10/24/2016   HCT 30.1 (L) 10/24/2016   MCV 67.5 (L) 10/24/2016   PLT 426 10/24/2016   Lab Results  Component Value Date   IRON 19 (L) 10/24/2016   TIBC 391 10/24/2016   IRONPCTSAT 5 (L) 10/24/2016   Lab Results  Component Value Date   FERRITIN 6 (L) 10/24/2016    STUDIES: No results found.  ASSESSMENT:  Iron deficiency anemia, locally advanced basal cell carcinoma of the right leg.  PLAN:    1. Iron deficiency anemia: Patient's hemoglobin has Improved and is stable at 9.7. She does not require blood transfusion today, but will proceed with 510 mg IV Feraheme today. She will return to clinic in 1 week for a second infusion.  2. Basal cell carcinoma of the right leg: Patient states this lesion has been growing for 9 years and she did not seek medical attention. Diagnosis confirmed by biopsy from surgery. CT scan results reviewed independently and reported no obvious metastatic disease. Patient recently underwent embolization. She has had significant improvement of the size of her tumor with a combination of a hedgehog inhibitor, vismodegib (Erivedge) 150 mg daily and the embolization. Return to clinic in 3 weeks for further evaluation.  3. Hypertension: Patient's blood pressure is significantly elevated today. She does not appear to be on hypertensive medications. Patient states her blood pressure is typically normal and attributes it to "white coat syndrome". Monitor. 4. Fatigue: Likely secondary to Erivedge, monitor. 5. Nausea: Continue current  symptomatic treatment.  Approximately 30 minutes was spent in discussion of which greater than 50% was consultation.  Patient expressed understanding and was in agreement with this plan. She also understands that She can call clinic at any time with any questions, concerns, or complaints.   Cancer Staging Basal cell carcinoma, leg, right Staging form: Melanoma of the Skin, AJCC 7th Edition - Clinical: No stage assigned - Unsigned   Lloyd Huger, MD   11/13/2016 10:55 PM

## 2016-11-14 ENCOUNTER — Inpatient Hospital Stay: Payer: Medicare Other | Admitting: Oncology

## 2016-11-14 ENCOUNTER — Inpatient Hospital Stay: Payer: Medicare Other

## 2016-11-14 ENCOUNTER — Ambulatory Visit: Payer: Medicare Other | Admitting: General Surgery

## 2016-11-24 ENCOUNTER — Encounter: Payer: Self-pay | Admitting: General Surgery

## 2016-11-24 ENCOUNTER — Ambulatory Visit (INDEPENDENT_AMBULATORY_CARE_PROVIDER_SITE_OTHER): Payer: Medicare Other | Admitting: General Surgery

## 2016-11-24 VITALS — BP 176/96 | HR 106 | Resp 12 | Ht 63.5 in | Wt 104.0 lb

## 2016-11-24 DIAGNOSIS — C44712 Basal cell carcinoma of skin of right lower limb, including hip: Secondary | ICD-10-CM

## 2016-11-24 NOTE — Progress Notes (Signed)
Patient ID: Patricia Galloway, female   DOB: 12/03/1950, 66 y.o.   MRN: [id number]  Chief Complaint  Patient presents with  . Follow-up    HPI Patricia Galloway is a 66 y.o. female. Here today for follow up right leg basal cell. She states chemotherapy is going well. She states the area is smaller and less bleeding.  She states that Dr Grayland Ormond tells her he is not sure when she will compete the treatment plan. She states she is moving into a one level house the first part of May and wants to wait for surgery til then if possible.  HPI  Past Medical History:  Diagnosis Date  . Anxiety   . Asthma    as child , but no episodes in over 30 years  . Cancer [facility])     Past Surgical History:  Procedure Laterality Date  . COLON SURGERY  2002   colostomy for 10 months after a perfurated sigmoid   . COLOSTOMY REVERSAL  2003  . EMBOLIZATION Right 10/12/2016   Procedure: Embolization;  Surgeon: [name], MD;  Location: [place] [facility];  Service: Cardiovascular;  Laterality: Right;  . etopic  4742,5956  . TONSILLECTOMY      Family History  Problem Relation Age of Onset  . Dementia Mother   . Aortic stenosis Mother   . Osteoporosis Mother   . Dementia Father   . Diabetes Father   . Hyperlipidemia Father   . Hypertension Father   . Aneurysm Sister   . Hyperlipidemia Sister   . Hypertension Sister   . Diabetes Sister     Social History Social History  Substance Use Topics  . Smoking status: Former Smoker    Packs/day: 0.50    Years: 25.00    Types: Cigarettes    Quit date: 08/03/1997  . Smokeless tobacco: Never Used  . Alcohol use No    Allergies  Allergen Reactions  . Penicillins Anaphylaxis  . Codeine Nausea And Vomiting    Percocet is ok to take  . Sulfur Other (See Comments)     Patient stated redness, discomfort, and made skin bleed  . Tape Other (See Comments)    Burns and peels    Current Outpatient Prescriptions  Medication Sig Dispense Refill  .  albuterol (PROVENTIL HFA;VENTOLIN HFA) 108 (90 Base) MCG/ACT inhaler Inhale 2 puffs into the lungs every 6 (six) hours as needed for wheezing or shortness of breath. 1 Inhaler 1  . Ascorbic Acid (VITAMIN C) 100 MG tablet Take 800 mg by mouth daily.     . Cholecalciferol (VITAMIN D3) 1000 units CAPS Take 1,000 Units by mouth daily.     . Coenzyme Q10 100 MG TABS Take 1 capsule by mouth 2 (two) times daily.     . diazepam (VALIUM) 5 MG tablet Take 1 tablet (5 mg total) by mouth every 12 (twelve) hours as needed for anxiety. 30 tablet 1  . Digestive Enzyme CAPS Take 1 capsule by mouth 3 (three) times daily with meals.    . fluticasone furoate-vilanterol (BREO ELLIPTA) 100-25 MCG/INH AEPB Inhale 1 puff into the lungs daily. (Patient taking differently: Inhale 1 puff into the lungs as needed. ) 60 each 5  . Ginkgo Biloba Extract (GNP GINGKO BILOBA EXTRACT) 60 MG CAPS Take 1 capsule by mouth daily.    . magnesium 30 MG tablet Take 30 mg by mouth 2 (two) times daily.    . Multiple Vitamin (MULTIVITAMIN) capsule Take 2 capsules by mouth  daily.     Marland Kitchen OVER THE COUNTER MEDICATION Take 1 tablet by mouth 2 (two) times daily. Mushroom Stamets 7    . Probiotic Product (PROBIOTIC DAILY PO) Take 3 capsules by mouth at bedtime. 42 Billion = 3 caps    . Spirulina 500 MG TABS Take 1 tablet by mouth every morning.    . vismodegib (ERIVEDGE) 150 MG capsule Take 1 capsule (150 mg total) by mouth daily. 30 capsule 2   No current facility-administered medications for this visit.     Review of Systems Review of Systems  Constitutional: Negative.   Respiratory: Negative.   Cardiovascular: Negative.     Blood pressure (!) 176/96, pulse (!) 106, resp. rate 12, height 5' 3.5" (1.613 m), weight 104 lb (47.2 kg).  Physical Exam Physical Exam  Constitutional: She is oriented to person, place, and time. She appears well-developed and well-nourished.  Neurological: She is alert and oriented to person, place, and time.   Skin: Skin is warm and dry.  9 x 9.5 cm right thigh basal cell lesion  Psychiatric: Her behavior is normal.    Data Reviewed Recent CBC of 10/24/2016 shows the hemoglobin improved 9.7 with an MCV of 67. White blood cell count 7500. Normal platelet count of 425,000.  10/12/2016 vascular procedure for embolization of the feeding vessels reviewed.    Assessment    Marked decrease in bulk, modest decrease in area of the extensive basal cell carcinoma involving the distal right thigh.    Plan    The patient has had no major bleeding episodes since embolization. She does see an occasional spot of blood from the granulation tissue where healing is occurring.  She's moving into a single level home in about 6 weeks and wants to defer surgery until after this moved takes place. Based on her present course I think this is very reasonable. She will likely require some surgery of skin coverage of this area and whether this is a skin graft or allograft will be determined in the near future.    Follow up in 6 weeks.  This information has been scribed by Karie Fetch RN, BSN,BC.  Robert Bellow 11/25/2016, 6:48 AM

## 2016-11-24 NOTE — Patient Instructions (Signed)
The patient is aware to call back for any questions or concerns.  

## 2016-11-30 ENCOUNTER — Other Ambulatory Visit: Payer: Self-pay

## 2016-12-04 NOTE — Progress Notes (Signed)
[place]  [phone] [phone]  ID: Patricia Galloway OB: [birth year]  MR#: [id number]  OVF#:[id number]  Patient Care Team: [name], MD as PCP - General (Family Medicine) [name], MD as Attending Physician (Family Medicine) Robert Bellow, MD (General Surgery)  CHIEF COMPLAINT: Iron deficiency anemia, locally advanced basal cell carcinoma of the right leg.  INTERVAL HISTORY: Patient returns to clinic today for repeat laboratory work and further evaluation. She continues to take Erivedge daily without significant side effects. She continues to have fatigue, thinning hair and occasional nausea. She currently feels well. She has no neurologic complaints. She denies any recent fevers. She has a good appetite and her weight is stable. She has no chest pain or shortness of breath. She denies any vomiting, constipation, or diarrhea. She has no urinary complaints. Patient offers no further specific complaints today.  REVIEW OF SYSTEMS:   Review of Systems  Constitutional: Positive for malaise/fatigue and weight loss. Negative for fever.  Respiratory: Negative.  Negative for cough and shortness of breath.   Cardiovascular: Negative.  Negative for chest pain and leg swelling.  Gastrointestinal: Positive for nausea. Negative for abdominal pain and vomiting.  Genitourinary: Negative.   Musculoskeletal: Negative.   Neurological: Negative.  Negative for sensory change, focal weakness and weakness.  Psychiatric/Behavioral: Negative.  The patient is not nervous/anxious.     As per HPI. Otherwise, a complete review of systems is negative.  PAST MEDICAL HISTORY: Past Medical History:  Diagnosis Date  . Anxiety   . Asthma    as child , but no episodes in over 30 years  . Cancer [facility])     PAST SURGICAL HISTORY: Past Surgical History:  Procedure Laterality Date  . COLON SURGERY  2002   colostomy for 10 months after a perfurated sigmoid     . COLOSTOMY REVERSAL  2003  . EMBOLIZATION Right 10/12/2016   Procedure: Embolization;  Surgeon: [name], MD;  Location: [place] [facility];  Service: Cardiovascular;  Laterality: Right;  . etopic  8416,6063  . TONSILLECTOMY      FAMILY HISTORY: Family History  Problem Relation Age of Onset  . Dementia Mother   . Aortic stenosis Mother   . Osteoporosis Mother   . Dementia Father   . Diabetes Father   . Hyperlipidemia Father   . Hypertension Father   . Aneurysm Sister   . Hyperlipidemia Sister   . Hypertension Sister   . Diabetes Sister     ADVANCED DIRECTIVES (Y/N):  N  HEALTH MAINTENANCE: Social History  Substance Use Topics  . Smoking status: Former Smoker    Packs/day: 0.50    Years: 25.00    Types: Cigarettes    Quit date: 08/03/1997  . Smokeless tobacco: Never Used  . Alcohol use No     Colonoscopy:  PAP:  Bone density:  Lipid panel:  Allergies  Allergen Reactions  . Penicillins Anaphylaxis  . Codeine Nausea And Vomiting    Percocet is ok to take  . Sulfur Other (See Comments)     Patient stated redness, discomfort, and made skin bleed  . Tape Other (See Comments)    Burns and peels    Current Outpatient Prescriptions  Medication Sig Dispense Refill  . albuterol (PROVENTIL HFA;VENTOLIN HFA) 108 (90 Base) MCG/ACT inhaler Inhale 2 puffs into the lungs every 6 (six) hours as needed for wheezing or shortness of breath. 1 Inhaler 1  . Ascorbic Acid (VITAMIN C) 100 MG tablet Take  800 mg by mouth daily.     . Cholecalciferol (VITAMIN D3) 1000 units CAPS Take 1,000 Units by mouth daily.     . Coenzyme Q10 100 MG TABS Take 1 capsule by mouth 2 (two) times daily.     . diazepam (VALIUM) 5 MG tablet Take 1 tablet (5 mg total) by mouth every 12 (twelve) hours as needed for anxiety. 30 tablet 1  . Digestive Enzyme CAPS Take 1 capsule by mouth 3 (three) times daily with meals.    . fluticasone furoate-vilanterol (BREO ELLIPTA) 100-25 MCG/INH AEPB Inhale  1 puff into the lungs daily. (Patient taking differently: Inhale 1 puff into the lungs as needed. ) 60 each 5  . Ginkgo Biloba Extract (GNP GINGKO BILOBA EXTRACT) 60 MG CAPS Take 1 capsule by mouth daily.    . magnesium 30 MG tablet Take 30 mg by mouth 2 (two) times daily.    . Multiple Vitamin (MULTIVITAMIN) capsule Take 2 capsules by mouth daily.     Marland Kitchen OVER THE COUNTER MEDICATION Take 1 tablet by mouth 2 (two) times daily. Mushroom Stamets 7    . Probiotic Product (PROBIOTIC DAILY PO) Take 3 capsules by mouth at bedtime. 64 Billion = 3 caps    . Spirulina 500 MG TABS Take 1 tablet by mouth every morning.    . vismodegib (ERIVEDGE) 150 MG capsule Take 1 capsule (150 mg total) by mouth daily. 30 capsule 2   No current facility-administered medications for this visit.     OBJECTIVE: Vitals:   12/05/16 1158  BP: (!) 156/79  Pulse: 85  Resp: 18  Temp: 97.4 F (36.3 C)     Body mass index is 18.47 kg/m.    ECOG FS:0 - Asymptomatic  General: Well-developed, well-nourished, no acute distress. Eyes: Pink conjunctiva, anicteric sclera. HEENT: Normocephalic, moist mucous membranes, clear oropharnyx. Lungs: Clear to auscultation bilaterally. Heart: Regular rate and rhythm. No rubs, murmurs, or gallops. Abdomen: Soft, nontender, nondistended. No organomegaly noted, normoactive bowel sounds. Musculoskeletal: No edema, cyanosis, or clubbing. Neuro: Alert, answering all questions appropriately. Cranial nerves grossly intact. Skin: Continued decrease in size of mass on right anterior medial leg, also noted is healthy granulation tissue on the more medial aspect of tumor. Psych: Normal affect.   LAB RESULTS:  Lab Results  Component Value Date   NA 135 10/24/2016   K 3.9 10/24/2016   CL 101 10/24/2016   CO2 25 10/24/2016   GLUCOSE 91 10/24/2016   BUN 18 10/24/2016   CREATININE 0.59 10/24/2016   CALCIUM 9.3 10/24/2016   PROT 7.5 10/24/2016   ALBUMIN 4.2 10/24/2016   AST 21 10/24/2016     ALT 17 10/24/2016   ALKPHOS 71 10/24/2016   BILITOT 0.4 10/24/2016   GFRNONAA >60 10/24/2016   GFRAA >60 10/24/2016    Lab Results  Component Value Date   WBC 7.7 12/05/2016   NEUTROABS 4.4 12/05/2016   HGB 13.3 12/05/2016   HCT 39.9 12/05/2016   MCV 77.4 (L) 12/05/2016   PLT 268 12/05/2016   Lab Results  Component Value Date   IRON 74 12/05/2016   TIBC 240 (L) 12/05/2016   IRONPCTSAT 31 12/05/2016   Lab Results  Component Value Date   FERRITIN 118 12/05/2016    STUDIES: No results found.  ASSESSMENT: Iron deficiency anemia, locally advanced basal cell carcinoma of the right leg.  PLAN:    1. Iron deficiency anemia: Patient's hemoglobin Is now within normal limits. She does not require blood transfusion  or IV iron today. Patient reports she has had no bleeding since her embolization several weeks ago.  2. Basal cell carcinoma of the right leg: Patient states this lesion has been growing for 9 years and she did not seek medical attention. Diagnosis confirmed by biopsy from surgery. CT scan results reviewed independently and reported no obvious metastatic disease. Patient recently underwent embolization. She has had significant improvement of the size of her tumor with a combination of a hedgehog inhibitor, vismodegib (Erivedge) 150 mg daily and the embolization. She acknowledges that she will eventually will require surgical resection of any remaining malignancy. She has an appointment with surgery in early May for further evaluation and then we will see her approximately one week later.  3. Hypertension: Patient's blood pressure remains elevated. She does not appear to be on hypertensive medications. Patient states her blood pressure is typically normal and attributes it to "white coat syndrome". Monitor. 4. Fatigue: Likely secondary to Erivedge, monitor. 5. Nausea: Continue current symptomatic treatment.  Approximately 30 minutes was spent in discussion of which greater  than 50% was consultation.  Patient expressed understanding and was in agreement with this plan. She also understands that She can call clinic at any time with any questions, concerns, or complaints.   Cancer Staging Basal cell carcinoma, leg, right Staging form: Melanoma of the Skin, AJCC 7th Edition - Clinical: No stage assigned - Unsigned   Lloyd Huger, MD   12/08/2016 8:39 AM

## 2016-12-05 ENCOUNTER — Inpatient Hospital Stay: Payer: Medicare Other | Attending: Oncology | Admitting: Oncology

## 2016-12-05 ENCOUNTER — Inpatient Hospital Stay: Payer: Medicare Other

## 2016-12-05 VITALS — BP 156/79 | HR 85 | Temp 97.4°F | Resp 18 | Wt 105.9 lb

## 2016-12-05 DIAGNOSIS — D509 Iron deficiency anemia, unspecified: Secondary | ICD-10-CM | POA: Diagnosis not present

## 2016-12-05 DIAGNOSIS — R112 Nausea with vomiting, unspecified: Secondary | ICD-10-CM | POA: Diagnosis not present

## 2016-12-05 DIAGNOSIS — Z79899 Other long term (current) drug therapy: Secondary | ICD-10-CM | POA: Diagnosis not present

## 2016-12-05 DIAGNOSIS — C44712 Basal cell carcinoma of skin of right lower limb, including hip: Secondary | ICD-10-CM

## 2016-12-05 DIAGNOSIS — R5383 Other fatigue: Secondary | ICD-10-CM | POA: Diagnosis not present

## 2016-12-05 DIAGNOSIS — D500 Iron deficiency anemia secondary to blood loss (chronic): Secondary | ICD-10-CM

## 2016-12-05 DIAGNOSIS — I10 Essential (primary) hypertension: Secondary | ICD-10-CM | POA: Diagnosis not present

## 2016-12-05 LAB — CBC WITH DIFFERENTIAL/PLATELET
Basophils Absolute: 0.1 10*3/uL (ref 0–0.1)
Basophils Relative: 2 %
EOS ABS: 0.3 10*3/uL (ref 0–0.7)
Eosinophils Relative: 3 %
HEMATOCRIT: 39.9 % (ref 35.0–47.0)
HEMOGLOBIN: 13.3 g/dL (ref 12.0–16.0)
LYMPHS ABS: 2 10*3/uL (ref 1.0–3.6)
Lymphocytes Relative: 26 %
MCH: 25.9 pg — AB (ref 26.0–34.0)
MCHC: 33.4 g/dL (ref 32.0–36.0)
MCV: 77.4 fL — AB (ref 80.0–100.0)
Monocytes Absolute: 0.9 10*3/uL (ref 0.2–0.9)
Monocytes Relative: 12 %
NEUTROS ABS: 4.4 10*3/uL (ref 1.4–6.5)
NEUTROS PCT: 57 %
Platelets: 268 10*3/uL (ref 150–440)
RBC: 5.16 MIL/uL (ref 3.80–5.20)
RDW: 33.4 % — ABNORMAL HIGH (ref 11.5–14.5)
WBC: 7.7 10*3/uL (ref 3.6–11.0)

## 2016-12-05 LAB — IRON AND TIBC
Iron: 74 ug/dL (ref 28–170)
Saturation Ratios: 31 % (ref 10.4–31.8)
TIBC: 240 ug/dL — ABNORMAL LOW (ref 250–450)
UIBC: 166 ug/dL

## 2016-12-05 LAB — FERRITIN: Ferritin: 118 ng/mL (ref 11–307)

## 2016-12-05 NOTE — Progress Notes (Signed)
Offers no complaints. States is feeling well. States understanding to take 1 cap (150mg ) Erivedge daily. States has not missed any doses since last visit.

## 2016-12-07 ENCOUNTER — Telehealth: Payer: Self-pay | Admitting: Pharmacist

## 2016-12-07 NOTE — Telephone Encounter (Signed)
Oral Chemotherapy Follow Up Encounter - Erivedge  Patricia Galloway was called on 12/07/2016 to discuss her medication Erivedge.   - Call Type: Follow up call #4 - final call  - Medication (how patient has been taking): Takes erivedge 150mg  PO around 11AM  - Issues with obtaining medication (cost, insurance coverage, etc.): None, pharmacy was proactive with reaching out to her for last refill.   - Adherence Techniques: None, takes at same time each day  - Number of missed doses: None  - Adverse Effects experienced: Same as previous, some fatigue and taste changes. She has also been experiencing hair loss. She appears to have good support system with her husband and has very positive attitude.   Demetrius Charity, PharmD Acute Care Pharmacy Resident  Pager: [phone] 12/07/2016

## 2016-12-26 ENCOUNTER — Encounter: Payer: Self-pay | Admitting: Family Medicine

## 2016-12-26 ENCOUNTER — Ambulatory Visit (INDEPENDENT_AMBULATORY_CARE_PROVIDER_SITE_OTHER): Payer: Medicare Other | Admitting: Family Medicine

## 2016-12-26 VITALS — BP 132/74 | HR 91 | Temp 97.7°F | Resp 16 | Ht 64.0 in | Wt 105.9 lb

## 2016-12-26 DIAGNOSIS — R739 Hyperglycemia, unspecified: Secondary | ICD-10-CM

## 2016-12-26 DIAGNOSIS — J449 Chronic obstructive pulmonary disease, unspecified: Secondary | ICD-10-CM

## 2016-12-26 DIAGNOSIS — R110 Nausea: Secondary | ICD-10-CM | POA: Diagnosis not present

## 2016-12-26 DIAGNOSIS — F325 Major depressive disorder, single episode, in full remission: Secondary | ICD-10-CM

## 2016-12-26 DIAGNOSIS — D500 Iron deficiency anemia secondary to blood loss (chronic): Secondary | ICD-10-CM

## 2016-12-26 DIAGNOSIS — I712 Thoracic aortic aneurysm, without rupture, unspecified: Secondary | ICD-10-CM

## 2016-12-26 DIAGNOSIS — J4540 Moderate persistent asthma, uncomplicated: Secondary | ICD-10-CM

## 2016-12-26 DIAGNOSIS — I700 Atherosclerosis of aorta: Secondary | ICD-10-CM | POA: Diagnosis not present

## 2016-12-26 DIAGNOSIS — C44712 Basal cell carcinoma of skin of right lower limb, including hip: Secondary | ICD-10-CM

## 2016-12-26 MED ORDER — ONDANSETRON HCL 4 MG PO TABS: 4.0000 mg | ORAL_TABLET | Freq: Three times a day (TID) | ORAL | 0 refills | Status: DC | PRN

## 2016-12-26 MED ORDER — LEVALBUTEROL HCL 0.63 MG/3ML IN NEBU
0.6300 mg | INHALATION_SOLUTION | Freq: Once | RESPIRATORY_TRACT | Status: AC
Start: 2016-12-26 — End: 2016-12-26
  Administered 2016-12-26: 0.63 mg via RESPIRATORY_TRACT

## 2016-12-26 NOTE — Progress Notes (Signed)
Name: Patricia Galloway   MRN: [id number]    DOB: [birth year]   Date:12/26/2016       Progress Note  Subjective  Chief Complaint  Chief Complaint  Patient presents with  . Medication Refill    3 month F/U  . Asthma  . Depression  . Anxiety  . Hypertension  . Anemia    HPI  Asthma/COPD: she has only been taking Breo prn. She had a CXR that showed emphysema. She states she has not been using it daily because of cost of medication, using it every few days to control wheezing and sometimes she has a cough.  Spirometry today was not good, very low FEV1/FVC, explained she needs to use Breo daily   Panic Attacks and Depression: unable to tolerate Citalopram , made her feel groggy, she never started Zoloft. She states Valium is working well, taking it prn. She will call when she needs follow up.  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 Her father died 5 years ago, mother died in Nov 09, 2014. She was doing better, but started to lose hair a few weeks ago and is very upset, tearful and crying more often. Not able to tolerate SSRI.   HTN: bp is at goal today, likely white coat.   Anemia: she was able to have labs done and was found to have severe anemia likely secondary to right thigh open wound that bleeds on a regular basis. Doing better since embolization  Leg mass: present for the past 9 years, initially a quarter size area. She  kept the area hidden from even her husband for years. She had a biopsy Fall 2017 and diagnosed with basal cell carcinoma, she started on chemo in 11/09/2016 and only a couple months into therapy that she developed hair loss and pain on hair scalp, and is now having nausea, no vomiting, but has lack of appetite. She would like something for nausea.  Thoracic aortic aneurysm: she has seen Dr. Payton Mccallum and will follow with him.    Patient Active Problem List   Diagnosis Date Noted  . Thoracic aortic aneurysm  without rupture ([place]) 11/09/2016  . Atherosclerosis of aorta ([place]) 11-09-2016  . Chronic pancreatitis ([place]) 11/09/2016  . Pulmonary nodule 2016/11/09  . Centrilobular emphysema ([place]) November 09, 2016  . Iron deficiency anemia due to chronic blood loss 10/24/2016  . Basal cell carcinoma, leg, right 08/24/2016  . Hyperglycemia 08/01/2016  . Mass of leg 08/25/2015  . Chronic thoracic back pain 08/04/2015  . Panic attack 08/04/2015  . Complicated grieving 34/19/3790  . Elevated blood pressure 08/04/2015  . Moderate major depression ([place]) 08/04/2015  . Asthma, moderate persistent, poorly-controlled 08/04/2015    Past Surgical History:  Procedure Laterality Date  . COLON SURGERY  09-Nov-2000   colostomy for 10 months after a perfurated sigmoid   . COLOSTOMY REVERSAL  11/09/2001  . EMBOLIZATION Right 10/12/2016   Procedure: Embolization;  Surgeon: [name], MD;  Location: [place] [facility];  Service: Cardiovascular;  Laterality: Right;  . etopic  2409,7353  . TONSILLECTOMY      Family History  Problem Relation Age of Onset  . Dementia Mother   . Aortic stenosis Mother   . Osteoporosis Mother   . Dementia Father   . Diabetes Father   . Hyperlipidemia Father   . Hypertension Father   . Aneurysm Sister   . Hyperlipidemia Sister   . Hypertension Sister   . Diabetes  Sister     Social History   Social History  . Marital status: Single    Spouse name: N/A  . Number of children: N/A  . Years of education: N/A   Occupational History  . Not on file.   Social History Main Topics  . Smoking status: Former Smoker    Packs/day: 0.50    Years: 25.00    Types: Cigarettes    Quit date: 08/03/1997  . Smokeless tobacco: Never Used  . Alcohol use No  . Drug use: No  . Sexual activity: Not Currently    Birth control/ protection: Post-menopausal   Other Topics Concern  . Not on file   Social History Narrative  . No narrative on file     Current Outpatient Prescriptions:  .   albuterol (PROVENTIL HFA;VENTOLIN HFA) 108 (90 Base) MCG/ACT inhaler, Inhale 2 puffs into the lungs every 6 (six) hours as needed for wheezing or shortness of breath., Disp: 1 Inhaler, Rfl: 1 .  Ascorbic Acid (VITAMIN C) 100 MG tablet, Take 800 mg by mouth daily. , Disp: , Rfl:  .  Cholecalciferol (VITAMIN D3) 1000 units CAPS, Take 1,000 Units by mouth daily. , Disp: , Rfl:  .  Coenzyme Q10 100 MG TABS, Take 1 capsule by mouth 2 (two) times daily. , Disp: , Rfl:  .  diazepam (VALIUM) 5 MG tablet, Take 1 tablet (5 mg total) by mouth every 12 (twelve) hours as needed for anxiety., Disp: 30 tablet, Rfl: 1 .  Digestive Enzyme CAPS, Take 1 capsule by mouth 3 (three) times daily with meals., Disp: , Rfl:  .  fluticasone furoate-vilanterol (BREO ELLIPTA) 100-25 MCG/INH AEPB, Inhale 1 puff into the lungs daily. (Patient taking differently: Inhale 1 puff into the lungs as needed. ), Disp: 60 each, Rfl: 5 .  Ginkgo Biloba Extract (GNP GINGKO BILOBA EXTRACT) 60 MG CAPS, Take 1 capsule by mouth daily., Disp: , Rfl:  .  magnesium 30 MG tablet, Take 30 mg by mouth 2 (two) times daily., Disp: , Rfl:  .  Multiple Vitamin (MULTIVITAMIN) capsule, Take 2 capsules by mouth daily. , Disp: , Rfl:  .  OVER THE COUNTER MEDICATION, Take 1 tablet by mouth 2 (two) times daily. Mushroom Stamets 7, Disp: , Rfl:  .  Probiotic Product (PROBIOTIC DAILY PO), Take 3 capsules by mouth at bedtime. 52 Billion = 3 caps, Disp: , Rfl:  .  Spirulina 500 MG TABS, Take 1 tablet by mouth every morning., Disp: , Rfl:  .  vismodegib (ERIVEDGE) 150 MG capsule, Take 1 capsule (150 mg total) by mouth daily., Disp: 30 capsule, Rfl: 2  Allergies  Allergen Reactions  . Penicillins Anaphylaxis  . Codeine Nausea And Vomiting    Percocet is ok to take  . Sulfur Other (See Comments)     Patient stated redness, discomfort, and made skin bleed  . Tape Other (See Comments)    Burns and peels     ROS  Constitutional: Negative for fever or weight  change.  Respiratory: Positive  for cough and shortness of breath.   Cardiovascular: Negative for chest pain or palpitations.  Gastrointestinal: Negative for abdominal pain, no bowel changes.  Musculoskeletal: Negative for gait problem or joint swelling.  Skin: Negative for skin cancer right leg Neurological: Negative for dizziness or headache.  No other specific complaints in a complete review of systems (except as listed in HPI above).  Objective  Vitals:   12/26/16 1139  BP: 132/74  Pulse: 91  Resp: 16  Temp: 97.7 F (36.5 C)  TempSrc: Oral  SpO2: 96%  Weight: 105 lb 14.4 oz (48 kg)  Height: 5' 4"  (1.626 m)    Body mass index is 18.18 kg/m.  Physical Exam  Constitutional: Patient appears anxious and thin, wearing a head wrap secondary to hair loss. No distress.  HEENT: head atraumatic, normocephalic, pupils equal and reactive to light,  neck supple, throat within normal limits Cardiovascular: Normal rate, regular rhythm and normal heart sounds.  No murmur heard. No BLE edema. Pulmonary/Chest: Effort normal and breath sounds normal. No respiratory distress. Abdominal: Soft.  There is no tenderness. Psychiatric: Patient anxious.   Recent Results (from the past 2160 hour(s))  Hold Tube- Blood Bank     Status: None   Collection Time: 10/10/16  8:42 AM  Result Value Ref Range   Blood Bank Specimen SAMPLE AVAILABLE FOR TESTING    Sample Expiration 10/13/2016   CBC with Differential     Status: Abnormal   Collection Time: 10/10/16  8:42 AM  Result Value Ref Range   WBC 6.5 3.6 - 11.0 K/uL   RBC 4.31 3.80 - 5.20 MIL/uL   Hemoglobin 9.4 (L) 12.0 - 16.0 g/dL   HCT 30.1 (L) 35.0 - 47.0 %   MCV 69.8 (L) 80.0 - 100.0 fL   MCH 21.8 (L) 26.0 - 34.0 pg   MCHC 31.2 (L) 32.0 - 36.0 g/dL   RDW 22.8 (H) 11.5 - 14.5 %   Platelets 464 (H) 150 - 440 K/uL   Neutrophils Relative % 64 %   Lymphocytes Relative 19 %   Monocytes Relative 8 %   Eosinophils Relative 7 %   Basophils  Relative 2 %   Neutro Abs 4.2 1.4 - 6.5 K/uL   Lymphs Abs 1.2 1.0 - 3.6 K/uL   Monocytes Absolute 0.5 0.2 - 0.9 K/uL   Eosinophils Absolute 0.5 0 - 0.7 K/uL   Basophils Absolute 0.1 0 - 0.1 K/uL   RBC Morphology MIXED RBC POPULATION     Comment: TARGET CELLS TEARDROP CELLS ELLIPTOCYTES HYPOCHROMASIA PRESENT    Smear Review SMEAR SCANNED MANUAL DIFF PERFORMED     Comment: PLATELETS APPEAR ADEQUATE  Comprehensive metabolic panel     Status: None   Collection Time: 10/24/16  8:46 AM  Result Value Ref Range   Sodium 135 135 - 145 mmol/L   Potassium 3.9 3.5 - 5.1 mmol/L   Chloride 101 101 - 111 mmol/L   CO2 25 22 - 32 mmol/L   Glucose, Bld 91 65 - 99 mg/dL   BUN 18 6 - 20 mg/dL   Creatinine, Ser 0.59 0.44 - 1.00 mg/dL   Calcium 9.3 8.9 - 10.3 mg/dL   Total Protein 7.5 6.5 - 8.1 g/dL   Albumin 4.2 3.5 - 5.0 g/dL   AST 21 15 - 41 U/L   ALT 17 14 - 54 U/L   Alkaline Phosphatase 71 38 - 126 U/L   Total Bilirubin 0.4 0.3 - 1.2 mg/dL   GFR calc non Af Amer >60 >60 mL/min   GFR calc Af Amer >60 >60 mL/min    Comment: (NOTE) The eGFR has been calculated using the CKD EPI equation. This calculation has not been validated in all clinical situations. eGFR's persistently <60 mL/min signify possible Chronic Kidney Disease.    Anion gap 9 5 - 15  Hold Tube- Blood Bank     Status: None   Collection Time: 10/24/16  8:46 AM  Result Value  Ref Range   Blood Bank Specimen SAMPLE AVAILABLE FOR TESTING    Sample Expiration 10/27/2016   Ferritin     Status: Abnormal   Collection Time: 10/24/16  8:46 AM  Result Value Ref Range   Ferritin 6 (L) 11 - 307 ng/mL  Iron and TIBC     Status: Abnormal   Collection Time: 10/24/16  8:46 AM  Result Value Ref Range   Iron 19 (L) 28 - 170 ug/dL   TIBC 391 250 - 450 ug/dL   Saturation Ratios 5 (L) 10.4 - 31.8 %   UIBC 372 ug/dL  CBC with Differential     Status: Abnormal   Collection Time: 10/24/16  8:46 AM  Result Value Ref Range   WBC 7.2 3.6 -  11.0 K/uL   RBC 4.46 3.80 - 5.20 MIL/uL   Hemoglobin 9.7 (L) 12.0 - 16.0 g/dL   HCT 30.1 (L) 35.0 - 47.0 %   MCV 67.5 (L) 80.0 - 100.0 fL   MCH 21.7 (L) 26.0 - 34.0 pg   MCHC 32.1 32.0 - 36.0 g/dL   RDW 22.4 (H) 11.5 - 14.5 %   Platelets 426 150 - 440 K/uL   Neutrophils Relative % 55 %   Neutro Abs 4.0 1.4 - 6.5 K/uL   Lymphocytes Relative 25 %   Lymphs Abs 1.8 1.0 - 3.6 K/uL   Monocytes Relative 13 %   Monocytes Absolute 0.9 0.2 - 0.9 K/uL   Eosinophils Relative 6 %   Eosinophils Absolute 0.4 0 - 0.7 K/uL   Basophils Relative 1 %   Basophils Absolute 0.1 0 - 0.1 K/uL  Ferritin     Status: None   Collection Time: 12/05/16 11:12 AM  Result Value Ref Range   Ferritin 118 11 - 307 ng/mL  Iron and TIBC     Status: Abnormal   Collection Time: 12/05/16 11:12 AM  Result Value Ref Range   Iron 74 28 - 170 ug/dL   TIBC 240 (L) 250 - 450 ug/dL   Saturation Ratios 31 10.4 - 31.8 %   UIBC 166 ug/dL  CBC with Differential     Status: Abnormal   Collection Time: 12/05/16 11:12 AM  Result Value Ref Range   WBC 7.7 3.6 - 11.0 K/uL   RBC 5.16 3.80 - 5.20 MIL/uL   Hemoglobin 13.3 12.0 - 16.0 g/dL   HCT 39.9 35.0 - 47.0 %   MCV 77.4 (L) 80.0 - 100.0 fL   MCH 25.9 (L) 26.0 - 34.0 pg   MCHC 33.4 32.0 - 36.0 g/dL   RDW 33.4 (H) 11.5 - 14.5 %   Platelets 268 150 - 440 K/uL   Neutrophils Relative % 57 %   Neutro Abs 4.4 1.4 - 6.5 K/uL   Lymphocytes Relative 26 %   Lymphs Abs 2.0 1.0 - 3.6 K/uL   Monocytes Relative 12 %   Monocytes Absolute 0.9 0.2 - 0.9 K/uL   Eosinophils Relative 3 %   Eosinophils Absolute 0.3 0 - 0.7 K/uL   Basophils Relative 2 %   Basophils Absolute 0.1 0 - 0.1 K/uL    PHQ2/9: Depression screen [facility] 2/9 10/31/2016 08/01/2016 03/09/2016 11/10/2015 08/04/2015  Decreased Interest 0 0 0 0 0  Down, Depressed, Hopeless 0 0 0 0 0  PHQ - 2 Score 0 0 0 0 0     Fall Risk: Fall Risk  10/31/2016 08/01/2016 03/09/2016 11/10/2015  Falls in the past year? No No No No  Assessment & Plan  1. COPD with asthma ([place])  - Spirometry: Pre & Post Eval - COPD with FEV1/FVC 64% with 12 % improvement with medication. Explained importance of using Breo daily and not just prn. - levalbuterol (XOPENEX) nebulizer solution 0.63 mg; Take 3 mLs (0.63 mg total) by nebulization once.  2. Basal cell carcinoma, leg, right  Continue follow up with Oncologist/Surgeon/Vascular Surgeon. She is upset about hair loss, but wants to get better  3. Depression, major ([place])  No longer in remission, seems very anxious and tearful during the visit today   4. Thoracic aortic aneurysm without rupture [facility])  Seen by Vascular surgeon and getting monitored  5. Atherosclerosis of aorta ([place])  She refuses statin therapy and unable to take aspirin at this itme  6. Hyperglycemia   7. Iron deficiency anemia due to chronic blood loss  Seeing oncologist, basal cell carcinoma no longer bleeding as often since embolization done   8. Nausea  - ondansetron (ZOFRAN) 4 MG tablet; Take 1 tablet (4 mg total) by mouth every 8 (eight) hours as needed for nausea or vomiting.  Dispense: 20 tablet; Refill: 0

## 2016-12-28 ENCOUNTER — Encounter: Payer: Self-pay | Admitting: Family Medicine

## 2017-01-03 ENCOUNTER — Encounter: Payer: Self-pay | Admitting: Physical Therapy

## 2017-01-03 ENCOUNTER — Ambulatory Visit: Payer: Self-pay | Attending: Family Medicine | Admitting: Physical Therapy

## 2017-01-03 DIAGNOSIS — M6281 Muscle weakness (generalized): Secondary | ICD-10-CM

## 2017-01-03 NOTE — Patient Instructions (Addendum)
Balance, Proprioception: Hip Abduction With Tubing   With tubing attached to both ankles, Standing holding onto counter, kick one leg out to side and then Return.  Repeat _10___ times  On each side.  Do ___2_ sessions per day.  http://cc.exer.us/20     Copyright  VHI. All rights reserved.  Balance, Proprioception: Hip Extension With Tubing   With tubing tied around both legs, holding onto kitchen counter, swing leg back. Return. Repeat _10___ times . Do __2__ sessions per day.  http://cc.exer.us/19    Copyright  VHI. All rights reserved.  Band Walk: Side Stepping   Tie band around legs, around ankles. Step _10__ feet to one side, then step back to start. Repeat _2-3__ feet per session. Note: Small towel between band and skin eases rubbing.  http://plyo.exer.us/76   Copyright  VHI. All rights reserved.   Band Walk: Zig Zag   Tie green band around legs, around ankles, Walk forward _both__ feet in a zig zag pattern. Without turning walk backward to start for one zig zag. Repeat _2-3__ zig zags per session.   http://plyo.exer.us/80   Copyright  VHI. All rights reserved.   Knee Extension: Terminal - Standing (Single Leg)    Face anchor in shoulder width stance, band around knee. Allow tension of band to slightly bend knee. Pull leg back, straightening knee. Repeat 10__ times per set. Repeat with other leg. Do _2_ sets per session. Do __5 sessions per week. Anchor Height: Knee  http://tub.exer.us/36   Copyright  VHI. All rights reserved.  Quad Set    With other leg bent, foot flat, slowly tighten muscles on thigh of straight leg; hold for 5 sec Repeat with other leg. Repeat _10___ times. Do ___2_ sessions per day.  http://gt2.exer.us/276   Copyright  VHI. All rights reserved.  Heel Slide - Beginner    Press small of back into mat, legs bent, feet relaxed on floor. Exhale, slowly slide one heel out as far as possible without raising lower back.  Inhale, slowly slide heel in. Repeat for _10___reps Copyright  VHI. All rights reserved.

## 2017-01-03 NOTE — Therapy (Signed)
[place] MAIN [facility] SERVICES [address] [place], [place], [address] Phone: [phone]   Fax:  [phone]  Physical Therapy Free Screen  Patient Details  Name: Patricia Galloway MRN: [id number] Date of Birth: 1950-12-12 No Data Recorded  Encounter Date: 01/03/2017    Past Medical History:  Diagnosis Date  . Anxiety   . Asthma    as child , but no episodes in over 30 years  . Cancer [facility])     Past Surgical History:  Procedure Laterality Date  . COLON SURGERY  2002   colostomy for 10 months after a perfurated sigmoid   . COLOSTOMY REVERSAL  2003  . EMBOLIZATION Right 10/12/2016   Procedure: Embolization;  Surgeon: [name], MD;  Location: [place] [facility];  Service: Cardiovascular;  Laterality: Right;  . etopic  5329,9242  . TONSILLECTOMY      There were no vitals filed for this visit.     PT/OT/SLP Screening Form   Time: in__1:45PM____     Time out_2:10 PM____   Complaint _Right thigh weakness__________________ Past Medical Hx:  __Does have some discomfort in back, but no injury; ______________ Injury Date:_over past 3 years_______________________________  Pain Scale: Denies any pain currently; since vascular surgery, she has not had any pain;  __________ Patient's phone number:   Hx (this occurrence):  Has Cancerous (basal cell) Metastatic tumour in right thigh; She is trying to get stronger to tolerate surgery to have it removed. Patient reports that the tumor has been attached to the femoral artery; She reports that she has had heavy bleeded. She reports that she couldn't walk without it bleeding. She did have vascular surgery which closed all the openings to stop the bleeding. She has had multiple blood transfusions and iron infusions. She reports getting hedgehog medication to shrink the tumor. She reports that the chemo is working; She started the CARE program in March 2018. She reports that her goal is to  get as strong as she can before she gets it removed. She reports that her body has wasted over the last 3 years with low endurance and low energy;   She reports that the tumor is not part of the muscle. It has been pressing on the muscle, but she would not loose the muscle with the surgery;   Patient is going to the CARE program. She walks on treadmill about 20-25 min; She reports that her right knee about gave out while walking;   She does have a resistance band that she uses at home. She does walking; She is able to do the leg press in the past few weeks (25#, 4 sets of 15)   Assessment:  BLE gross strength: grossly 4/5 with exception: RLE hamstrings 3+/5;   Intact light touch sensation in BLE;   Denies any tenderness to palpation;   Recommendations:    PT provided written handout of LE strengthening exercise that she can incorporate into HEP with red tband for resistance; Recommend patient follow up with MD if weakness continues for referral for PT;  Comments: Patient agreeable. She will do HEP at this time and will call us if she needs additional services.    []  Patient would benefit from an MD referral []  Patient would benefit from a full PT/OT/ SLP evaluation and treatment. [x]  No intervention recommended at this time.  Patient will benefit from skilled therapeutic intervention in order to improve the following deficits and impairments:     Visit Diagnosis: Muscle weakness (generalized)     Problem List Patient Active Problem List   Diagnosis Date Noted  . Thoracic aortic aneurysm without rupture ([place]) 10/31/2016  . Atherosclerosis of aorta ([place]) 10/31/2016  . Chronic pancreatitis ([place]) 10/31/2016  . Pulmonary nodule 10/31/2016  . Centrilobular emphysema ([place]) 10/31/2016  . Iron deficiency anemia due to chronic blood loss 10/24/2016  . Basal cell carcinoma, leg, right 08/24/2016  . Hyperglycemia  08/01/2016  . Mass of leg 08/25/2015  . Chronic thoracic back pain 08/04/2015  . Panic attack 08/04/2015  . Complicated grieving 20/94/7096  . Elevated blood pressure 08/04/2015  . Moderate major depression ([place]) 08/04/2015  . Asthma, moderate persistent, poorly-controlled 08/04/2015    Trotter,Margaret PT, DPT 01/03/2017, 4:46 PM  [place] MAIN [facility] SERVICES [address] [place], [place], [address] Phone: [phone]   Fax:  [phone]  Name: Patricia Galloway MRN: [id number] Date of Birth: 02/14/1951

## 2017-01-08 NOTE — Progress Notes (Signed)
[place]  [phone] [phone]  ID: Patricia Galloway OB: April 27, 1951  MR#: [id number]  QBH#:[id number]  Patient Care Team: [name], MD as PCP - General (Family Medicine) [name], MD as Attending Physician (Family Medicine) Robert Bellow, MD (General Surgery)  CHIEF COMPLAINT: Iron deficiency anemia, locally advanced basal cell carcinoma of the right leg.  INTERVAL HISTORY: Patient returns to clinic today for repeat laboratory work and further evaluation. She continues to take Erivedge daily without significant side effects. She continues to have fatigue, thinning hair and occasional nausea. She currently feels well. She has no neurologic complaints. She denies any recent fevers. She has a good appetite and her weight is stable. She has no chest pain or shortness of breath. She denies any vomiting, constipation, or diarrhea. She has no urinary complaints. Patient offers no further specific complaints today.  REVIEW OF SYSTEMS:   Review of Systems  Constitutional: Positive for malaise/fatigue. Negative for fever and weight loss.  Respiratory: Negative.  Negative for cough and shortness of breath.   Cardiovascular: Negative.  Negative for chest pain and leg swelling.  Gastrointestinal: Positive for nausea. Negative for abdominal pain and vomiting.  Genitourinary: Negative.   Musculoskeletal: Negative.   Neurological: Negative.  Negative for sensory change, focal weakness and weakness.  Psychiatric/Behavioral: Negative.  The patient is not nervous/anxious.     As per HPI. Otherwise, a complete review of systems is negative.  PAST MEDICAL HISTORY: Past Medical History:  Diagnosis Date  . Anxiety   . Asthma    as child , but no episodes in over 30 years  . Cancer [facility])     PAST SURGICAL HISTORY: Past Surgical History:  Procedure Laterality Date  . COLON SURGERY  2002   colostomy for 10 months after a perfurated sigmoid    . COLOSTOMY REVERSAL  2003  . EMBOLIZATION Right 10/12/2016   Procedure: Embolization;  Surgeon: [name], MD;  Location: [place] [facility];  Service: Cardiovascular;  Laterality: Right;  . etopic  0973,5329  . TONSILLECTOMY      FAMILY HISTORY: Family History  Problem Relation Age of Onset  . Dementia Mother   . Aortic stenosis Mother   . Osteoporosis Mother   . Dementia Father   . Diabetes Father   . Hyperlipidemia Father   . Hypertension Father   . Aneurysm Sister   . Hyperlipidemia Sister   . Hypertension Sister   . Diabetes Sister     ADVANCED DIRECTIVES (Y/N):  N  HEALTH MAINTENANCE: Social History  Substance Use Topics  . Smoking status: Former Smoker    Packs/day: 0.50    Years: 25.00    Types: Cigarettes    Quit date: 08/03/1997  . Smokeless tobacco: Never Used  . Alcohol use No     Colonoscopy:  PAP:  Bone density:  Lipid panel:  Allergies  Allergen Reactions  . Penicillins Anaphylaxis  . Codeine Nausea And Vomiting    Percocet is ok to take  . Sulfur Other (See Comments)     Patient stated redness, discomfort, and made skin bleed  . Tape Other (See Comments)    Burns and peels  . Xopenex [Levalbuterol Hcl] Other (See Comments)    Makes her feel very anxious     Current Outpatient Prescriptions  Medication Sig Dispense Refill  . albuterol (PROVENTIL HFA;VENTOLIN HFA) 108 (90 Base) MCG/ACT inhaler Inhale 2 puffs into the lungs every 6 (six) hours as needed for wheezing or  shortness of breath. 1 Inhaler 1  . Ascorbic Acid (VITAMIN C) 100 MG tablet Take 800 mg by mouth daily.     . Cholecalciferol (VITAMIN D3) 1000 units CAPS Take 1,000 Units by mouth daily.     . Coenzyme Q10 100 MG TABS Take 1 capsule by mouth 2 (two) times daily.     . diazepam (VALIUM) 5 MG tablet Take 1 tablet (5 mg total) by mouth every 12 (twelve) hours as needed for anxiety. 30 tablet 1  . Digestive Enzyme CAPS Take 1 capsule by mouth 3 (three) times daily with  meals.    . fluticasone furoate-vilanterol (BREO ELLIPTA) 100-25 MCG/INH AEPB Inhale 1 puff into the lungs daily. (Patient taking differently: Inhale 1 puff into the lungs as needed. ) 60 each 5  . Ginkgo Biloba Extract (GNP GINGKO BILOBA EXTRACT) 60 MG CAPS Take 1 capsule by mouth daily.    . magnesium 30 MG tablet Take 30 mg by mouth 2 (two) times daily.    . Multiple Vitamin (MULTIVITAMIN) capsule Take 2 capsules by mouth daily.     . ondansetron (ZOFRAN) 4 MG tablet Take 1 tablet (4 mg total) by mouth every 8 (eight) hours as needed for nausea or vomiting. 20 tablet 0  . OVER THE COUNTER MEDICATION Take 1 tablet by mouth 2 (two) times daily. Mushroom Stamets 7    . Probiotic Product (PROBIOTIC DAILY PO) Take 3 capsules by mouth at bedtime. 57 Billion = 3 caps    . Spirulina 500 MG TABS Take 1 tablet by mouth every morning.    . vismodegib (ERIVEDGE) 150 MG capsule Take 1 capsule (150 mg total) by mouth daily. 30 capsule 2   No current facility-administered medications for this visit.     OBJECTIVE: Vitals:   01/09/17 1421  BP: (!) 191/106  Pulse: 87  Temp: 98.6 F (37 C)     Body mass index is 18.46 kg/m.    ECOG FS:0 - Asymptomatic  General: Well-developed, well-nourished, no acute distress. Eyes: Pink conjunctiva, anicteric sclera. Lungs: Clear to auscultation bilaterally. Heart: Regular rate and rhythm. No rubs, murmurs, or gallops. Abdomen: Soft, nontender, nondistended. No organomegaly noted, normoactive bowel sounds. Musculoskeletal: No edema, cyanosis, or clubbing. Neuro: Alert, answering all questions appropriately. Cranial nerves grossly intact. Skin: Continued decrease in size of mass on right anterior medial leg, also noted is healthy granulation tissue on the more medial aspect of tumor. Psych: Normal affect.   LAB RESULTS:  Lab Results  Component Value Date   NA 135 10/24/2016   K 3.9 10/24/2016   CL 101 10/24/2016   CO2 25 10/24/2016   GLUCOSE 91  10/24/2016   BUN 18 10/24/2016   CREATININE 0.59 10/24/2016   CALCIUM 9.3 10/24/2016   PROT 7.5 10/24/2016   ALBUMIN 4.2 10/24/2016   AST 21 10/24/2016   ALT 17 10/24/2016   ALKPHOS 71 10/24/2016   BILITOT 0.4 10/24/2016   GFRNONAA >60 10/24/2016   GFRAA >60 10/24/2016    Lab Results  Component Value Date   WBC 7.1 01/09/2017   NEUTROABS 3.9 01/09/2017   HGB 13.0 01/09/2017   HCT 37.5 01/09/2017   MCV 82.7 01/09/2017   PLT 242 01/09/2017   Lab Results  Component Value Date   IRON 71 01/09/2017   TIBC 225 (L) 01/09/2017   IRONPCTSAT 32 (H) 01/09/2017   Lab Results  Component Value Date   FERRITIN 58 01/09/2017    STUDIES: No results found.  ASSESSMENT: Iron  deficiency anemia, locally advanced basal cell carcinoma of the right leg.  PLAN:    1. Iron deficiency anemia: Patient's hemoglobin and iron stores are now within normal limits. She does not require blood transfusion or IV iron today. Patient reports she has had no bleeding since her embolization.  2. Basal cell carcinoma of the right leg: Patient states this lesion has been growing for 9 years and she did not seek medical attention. Diagnosis confirmed by biopsy from surgery. CT scan results reviewed independently and reported no obvious metastatic disease. Patient recently underwent embolization. She has had significant improvement of the size of her tumor with a combination of a hedgehog inhibitor, vismodegib (Erivedge) 150 mg daily and the embolization. Surgery reports the lesion was 5 mm larger today. Patient has elected to delay her surgery even further until the end of June after she completes a move. Patient has been instructed to continue daily treatment until 3-4 weeks prior to her scheduled surgery and then discontinue. It is unclear if she will need adjuvant treatment.  Return to clinic in 2 months for further evaluation and possibly postop evaluation.  3. Hypertension: Patient's blood pressure remains  significantly elevated. She does not appear to be on hypertensive medications. Patient states her blood pressure is typically normal and attributes it to "white coat syndrome". Monitor. 4. Fatigue: Likely secondary to Erivedge, monitor. 5. Nausea: Continue current symptomatic treatment.  Approximately 30 minutes was spent in discussion of which greater than 50% was consultation.  Patient expressed understanding and was in agreement with this plan. She also understands that She can call clinic at any time with any questions, concerns, or complaints.   Cancer Staging Basal cell carcinoma, leg, right Staging form: Melanoma of the Skin, AJCC 7th Edition - Clinical: No stage assigned - Unsigned   Lloyd Huger, MD   01/09/2017 4:41 PM

## 2017-01-09 ENCOUNTER — Encounter: Payer: Self-pay | Admitting: General Surgery

## 2017-01-09 ENCOUNTER — Inpatient Hospital Stay: Payer: Medicare Other | Attending: Oncology

## 2017-01-09 ENCOUNTER — Encounter: Payer: Self-pay | Admitting: Oncology

## 2017-01-09 ENCOUNTER — Ambulatory Visit (INDEPENDENT_AMBULATORY_CARE_PROVIDER_SITE_OTHER): Payer: Medicare Other | Admitting: General Surgery

## 2017-01-09 ENCOUNTER — Inpatient Hospital Stay (HOSPITAL_BASED_OUTPATIENT_CLINIC_OR_DEPARTMENT_OTHER): Payer: Medicare Other | Admitting: Oncology

## 2017-01-09 VITALS — BP 191/106 | HR 87 | Temp 98.6°F | Wt 104.2 lb

## 2017-01-09 VITALS — BP 112/68 | HR 72 | Resp 12 | Ht 63.0 in | Wt 103.0 lb

## 2017-01-09 DIAGNOSIS — F419 Anxiety disorder, unspecified: Secondary | ICD-10-CM | POA: Diagnosis not present

## 2017-01-09 DIAGNOSIS — Z87891 Personal history of nicotine dependence: Secondary | ICD-10-CM | POA: Diagnosis not present

## 2017-01-09 DIAGNOSIS — I10 Essential (primary) hypertension: Secondary | ICD-10-CM | POA: Diagnosis not present

## 2017-01-09 DIAGNOSIS — C44712 Basal cell carcinoma of skin of right lower limb, including hip: Secondary | ICD-10-CM

## 2017-01-09 DIAGNOSIS — D500 Iron deficiency anemia secondary to blood loss (chronic): Secondary | ICD-10-CM

## 2017-01-09 DIAGNOSIS — D509 Iron deficiency anemia, unspecified: Secondary | ICD-10-CM | POA: Insufficient documentation

## 2017-01-09 DIAGNOSIS — J45909 Unspecified asthma, uncomplicated: Secondary | ICD-10-CM | POA: Diagnosis not present

## 2017-01-09 LAB — IRON AND TIBC
Iron: 71 ug/dL (ref 28–170)
SATURATION RATIOS: 32 % — AB (ref 10.4–31.8)
TIBC: 225 ug/dL — AB (ref 250–450)
UIBC: 154 ug/dL

## 2017-01-09 LAB — CBC WITH DIFFERENTIAL/PLATELET
Basophils Absolute: 0.1 10*3/uL (ref 0–0.1)
Basophils Relative: 1 %
EOS ABS: 0.7 10*3/uL (ref 0–0.7)
EOS PCT: 10 %
HCT: 37.5 % (ref 35.0–47.0)
Hemoglobin: 13 g/dL (ref 12.0–16.0)
LYMPHS ABS: 1.7 10*3/uL (ref 1.0–3.6)
Lymphocytes Relative: 24 %
MCH: 28.7 pg (ref 26.0–34.0)
MCHC: 34.8 g/dL (ref 32.0–36.0)
MCV: 82.7 fL (ref 80.0–100.0)
MONO ABS: 0.7 10*3/uL (ref 0.2–0.9)
MONOS PCT: 10 %
Neutro Abs: 3.9 10*3/uL (ref 1.4–6.5)
Neutrophils Relative %: 55 %
Platelets: 242 10*3/uL (ref 150–440)
RBC: 4.53 MIL/uL (ref 3.80–5.20)
RDW: 26.8 % — AB (ref 11.5–14.5)
WBC: 7.1 10*3/uL (ref 3.6–11.0)

## 2017-01-09 LAB — FERRITIN: FERRITIN: 58 ng/mL (ref 11–307)

## 2017-01-09 NOTE — Progress Notes (Signed)
Patient ID: Trudy Kory, female   DOB: 04/03/1951, 66 y.o.   MRN: [id number]  Chief Complaint  Patient presents with  . Other    6 week basal cell right leg     HPI Lyndsi Betterton is a 66 y.o. female is here today for a 6 week follow up for right leg basal cell cancer. Patient doing well. Patient still undergoing oral chemo treatments with a "hedgehog" inhibitor which she is tolerating well. Patient goes two times a week to work out. At her last visit I was under the impression that she would've moved into her parents home by now, and would be amenable to consider surgical excision. She reports that this is not going to be possible until at least the middle of June. HPI  Past Medical History:  Diagnosis Date  . Anxiety   . Asthma    as child , but no episodes in over 30 years  . Cancer [facility])     Past Surgical History:  Procedure Laterality Date  . COLON SURGERY  2002   colostomy for 10 months after a perfurated sigmoid   . COLOSTOMY REVERSAL  2003  . EMBOLIZATION Right 10/12/2016   Procedure: Embolization;  Surgeon: [name], MD;  Location: [place] [facility];  Service: Cardiovascular;  Laterality: Right;  . etopic  4174,0814  . TONSILLECTOMY      Family History  Problem Relation Age of Onset  . Dementia Mother   . Aortic stenosis Mother   . Osteoporosis Mother   . Dementia Father   . Diabetes Father   . Hyperlipidemia Father   . Hypertension Father   . Aneurysm Sister   . Hyperlipidemia Sister   . Hypertension Sister   . Diabetes Sister     Social History Social History  Substance Use Topics  . Smoking status: Former Smoker    Packs/day: 0.50    Years: 25.00    Types: Cigarettes    Quit date: 08/03/1997  . Smokeless tobacco: Never Used  . Alcohol use No    Allergies  Allergen Reactions  . Penicillins Anaphylaxis  . Codeine Nausea And Vomiting    Percocet is ok to take  . Sulfur Other (See Comments)     Patient stated redness, discomfort,  and made skin bleed  . Tape Other (See Comments)    Burns and peels  . Xopenex [Levalbuterol Hcl] Other (See Comments)    Makes her feel very anxious     Current Outpatient Prescriptions  Medication Sig Dispense Refill  . albuterol (PROVENTIL HFA;VENTOLIN HFA) 108 (90 Base) MCG/ACT inhaler Inhale 2 puffs into the lungs every 6 (six) hours as needed for wheezing or shortness of breath. 1 Inhaler 1  . Ascorbic Acid (VITAMIN C) 100 MG tablet Take 800 mg by mouth daily.     . Cholecalciferol (VITAMIN D3) 1000 units CAPS Take 1,000 Units by mouth daily.     . Coenzyme Q10 100 MG TABS Take 1 capsule by mouth 2 (two) times daily.     . diazepam (VALIUM) 5 MG tablet Take 1 tablet (5 mg total) by mouth every 12 (twelve) hours as needed for anxiety. 30 tablet 1  . Digestive Enzyme CAPS Take 1 capsule by mouth 3 (three) times daily with meals.    . fluticasone furoate-vilanterol (BREO ELLIPTA) 100-25 MCG/INH AEPB Inhale 1 puff into the lungs daily. (Patient taking differently: Inhale 1 puff into the lungs as needed. ) 60 each 5  . Ginkgo Biloba  Extract (GNP GINGKO BILOBA EXTRACT) 60 MG CAPS Take 1 capsule by mouth daily.    . magnesium 30 MG tablet Take 30 mg by mouth 2 (two) times daily.    . Multiple Vitamin (MULTIVITAMIN) capsule Take 2 capsules by mouth daily.     . ondansetron (ZOFRAN) 4 MG tablet Take 1 tablet (4 mg total) by mouth every 8 (eight) hours as needed for nausea or vomiting. 20 tablet 0  . OVER THE COUNTER MEDICATION Take 1 tablet by mouth 2 (two) times daily. Mushroom Stamets 7    . Probiotic Product (PROBIOTIC DAILY PO) Take 3 capsules by mouth at bedtime. 7 Billion = 3 caps    . Spirulina 500 MG TABS Take 1 tablet by mouth every morning.    . vismodegib (ERIVEDGE) 150 MG capsule Take 1 capsule (150 mg total) by mouth daily. 30 capsule 2   No current facility-administered medications for this visit.     Review of Systems Review of Systems  Constitutional: Negative.    Respiratory: Negative.   Cardiovascular: Negative.     Blood pressure 112/68, pulse 72, resp. rate 12, height 5\' 3"  (1.6 m), weight 103 lb (46.7 kg).  Physical Exam Physical Exam  Constitutional: She is oriented to person, place, and time. She appears well-developed and well-nourished.  Neurological: She is alert and oriented to person, place, and time.  Skin: Skin is warm and dry.   10 by 10 cm right thigh basal cell lesion. This is increased by 0.5 cm since her last visit.  Data Reviewed Discussed case with Dr. Grayland Ormond for medical oncology. He is suggested that her chemotherapeutic agent should be held 3 weeks prior to her planned surgical procedure.  Assessment    Slight increase in size of the chronic basal cell carcinoma involving the right distal medial thigh.    Plan      Patient to return in one month. Patient to return be scheduled for excision right leg middle of June per patient.  HPI, Physical Exam, Assessment and Plan have been scribed under the direction and in the presence of Hervey Ard, MD.  Gaspar Cola, CMA    I have completed the exam and reviewed the above documentation for accuracy and completeness.  I agree with the above.  Haematologist has been used and any errors in dictation or transcription are unintentional.  Hervey Ard, M.D., F.A.C.S.    Robert Bellow 01/10/2017, 8:36 PM

## 2017-01-09 NOTE — Patient Instructions (Signed)
Patient to return in one month. 

## 2017-01-25 ENCOUNTER — Ambulatory Visit: Payer: Medicare Other

## 2017-02-08 ENCOUNTER — Encounter: Payer: Self-pay | Admitting: Vascular Surgery

## 2017-02-09 ENCOUNTER — Encounter: Payer: Self-pay | Admitting: Family Medicine

## 2017-02-09 ENCOUNTER — Ambulatory Visit (INDEPENDENT_AMBULATORY_CARE_PROVIDER_SITE_OTHER): Payer: Medicare Other | Admitting: General Surgery

## 2017-02-09 ENCOUNTER — Encounter: Payer: Self-pay | Admitting: General Surgery

## 2017-02-09 VITALS — BP 158/88 | HR 96 | Resp 14 | Ht 63.5 in | Wt 101.0 lb

## 2017-02-09 DIAGNOSIS — F410 Panic disorder [episodic paroxysmal anxiety] without agoraphobia: Secondary | ICD-10-CM

## 2017-02-09 DIAGNOSIS — F4321 Adjustment disorder with depressed mood: Secondary | ICD-10-CM

## 2017-02-09 DIAGNOSIS — Z634 Disappearance and death of family member: Secondary | ICD-10-CM

## 2017-02-09 DIAGNOSIS — F4329 Adjustment disorder with other symptoms: Secondary | ICD-10-CM

## 2017-02-09 DIAGNOSIS — C44712 Basal cell carcinoma of skin of right lower limb, including hip: Secondary | ICD-10-CM | POA: Diagnosis not present

## 2017-02-09 MED ORDER — DIAZEPAM 5 MG PO TABS: 5.0000 mg | ORAL_TABLET | Freq: Two times a day (BID) | ORAL | 0 refills | Status: DC | PRN

## 2017-02-09 NOTE — Patient Instructions (Addendum)
The patient is aware to call back for any questions or new concerns.  Plan surgery in a 2 step process: excision then skin graft She will call to schedule at her convenience.

## 2017-02-09 NOTE — Progress Notes (Signed)
Patient ID: Patricia Galloway, female   DOB: Mar 12, 1951, 66 y.o.   MRN: [id number]  Chief Complaint  Patient presents with  . Follow-up    HPI Patricia Galloway is a 66 y.o. female. here today for her follow up for right leg basal cell cancer. Patient doing well. Patient still undergoing oral chemo treatments with a "hedgehog" inhibitor which she is tolerating well.   Mild side effects of left hand and foot cramps.  Appetite is fair.  Patient reports a rare drop of drainage, no bleeding since embolization.  Husband in remission of renal cell carcinoma.Present for 20 years, declined surgical intervention/chemotherapy, Eastern medication used.   HPI  Past Medical History:  Diagnosis Date  . Anxiety   . Asthma    as child , but no episodes in over 30 years  . Cancer [facility])     Past Surgical History:  Procedure Laterality Date  . COLON SURGERY  2002   colostomy for 10 months after a perfurated sigmoid   . COLOSTOMY REVERSAL  2003  . EMBOLIZATION Right 10/12/2016   Procedure: Embolization;  Surgeon: [name], MD;  Location: [facility];  Service: Cardiovascular;  Laterality: Right;  . etopic  [id number]  . TONSILLECTOMY      Family History  Problem Relation Age of Onset  . Dementia Mother   . Aortic stenosis Mother   . Osteoporosis Mother   . Dementia Father   . Diabetes Father   . Hyperlipidemia Father   . Hypertension Father   . Aneurysm Sister   . Hyperlipidemia Sister   . Hypertension Sister   . Diabetes Sister     Social History Social History  Substance Use Topics  . Smoking status: Former Smoker    Packs/day: 0.50    Years: 25.00    Types: Cigarettes    Quit date: 08/03/1997  . Smokeless tobacco: Never Used  . Alcohol use No    Allergies  Allergen Reactions  . Penicillins Anaphylaxis  . Codeine Nausea And Vomiting    Percocet is ok to take  . Sulfur Other (See Comments)     Patient stated redness, discomfort, and made skin bleed  . Tape  Other (See Comments)    Burns and peels  . Xopenex [Levalbuterol Hcl] Other (See Comments)    Makes her feel very anxious     Current Outpatient Prescriptions  Medication Sig Dispense Refill  . albuterol (PROVENTIL HFA;VENTOLIN HFA) 108 (90 Base) MCG/ACT inhaler Inhale 2 puffs into the lungs every 6 (six) hours as needed for wheezing or shortness of breath. 1 Inhaler 1  . Ascorbic Acid (VITAMIN C) 100 MG tablet Take 800 mg by mouth daily.     . Cholecalciferol (VITAMIN D3) 1000 units CAPS Take 1,000 Units by mouth daily.     . Coenzyme Q10 100 MG TABS Take 1 capsule by mouth 2 (two) times daily.     . diazepam (VALIUM) 5 MG tablet Take 1 tablet (5 mg total) by mouth every 12 (twelve) hours as needed for anxiety. 30 tablet 0  . Digestive Enzyme CAPS Take 1 capsule by mouth 3 (three) times daily with meals.    . fluticasone furoate-vilanterol (BREO ELLIPTA) 100-25 MCG/INH AEPB Inhale 1 puff into the lungs daily. (Patient taking differently: Inhale 1 puff into the lungs as needed. ) 60 each 5  . Ginkgo Biloba Extract (GNP GINGKO BILOBA EXTRACT) 60 MG CAPS Take 1 capsule by mouth daily.    . magnesium 30  MG tablet Take 30 mg by mouth 2 (two) times daily.    . Multiple Vitamin (MULTIVITAMIN) capsule Take 2 capsules by mouth daily.     . ondansetron (ZOFRAN) 4 MG tablet Take 1 tablet (4 mg total) by mouth every 8 (eight) hours as needed for nausea or vomiting. 20 tablet 0  . OVER THE COUNTER MEDICATION Take 1 tablet by mouth 2 (two) times daily. Mushroom Stamets 7    . Probiotic Product (PROBIOTIC DAILY PO) Take 3 capsules by mouth at bedtime. 60 Billion = 3 caps    . Spirulina 500 MG TABS Take 1 tablet by mouth every morning.    . vismodegib (ERIVEDGE) 150 MG capsule Take 1 capsule (150 mg total) by mouth daily. 30 capsule 2   No current facility-administered medications for this visit.     Review of Systems Review of Systems  Constitutional: Negative.   Respiratory: Negative.     Blood  pressure (!) 158/88, pulse 96, resp. rate 14, height 5' 3.5" (1.613 m), weight 101 lb (45.8 kg).  Physical Exam Physical Exam  Constitutional: She is oriented to person, place, and time. She appears well-developed and well-nourished.  HENT:  Mouth/Throat: Oropharynx is clear and moist.  Eyes: Conjunctivae are normal. No scleral icterus.  Neck: Neck supple.  Cardiovascular: Normal rate, regular rhythm, normal heart sounds and intact distal pulses.   No lower extremity edema  Pulmonary/Chest: Effort normal and breath sounds normal.  Lymphadenopathy:    She has no cervical adenopathy.  Neurological: She is alert and oriented to person, place, and time.  Skin: Skin is warm and dry.  9.5 x 10 cm right thigh basel cell carcinoma   Psychiatric: Her behavior is normal.      Assessment    Extensive basal cell carcinoma the right lower medial thigh, stable over the last month.     Plan    The patient feels that she will be able to have assistance from her husband towards the middle of July and the work involved with moving into her new home is coming to an end.  Considering the area involved I recommended that we excise the area, placed temporary dressing, most likely wound VAC, and then return for skin grafting after margins are shown to be clear 48-72 hours later.      Plan surgery in a 2 step process: excision then skin graft. Percocet works best for her pain. Discussed wound vac being a possibility  She will call to schedule at her convenience.  She will stop her oral  Chemotherapy medication 3 weeks prior to surgery. Consider week of July 23rd    HPI, Physical Exam, Assessment and Plan have been scribed under the direction and in the presence of Robert Bellow, MD.  Karie Fetch, RN  I have completed the exam and reviewed the above documentation for accuracy and completeness.  I agree with the above.  Haematologist has been used and any errors in dictation or  transcription are unintentional.  Hervey Ard, M.D., F.A.C.S.  Robert Bellow 02/10/2017, 7:58 PM

## 2017-02-13 ENCOUNTER — Telehealth: Payer: Self-pay

## 2017-02-13 NOTE — Telephone Encounter (Signed)
-----   Message from Robert Bellow, MD sent at 02/12/2017  9:35 AM EDT ----- Please notify the patient that after reviewing her case, I would like her to return for biopsy of the lesion on the right thigh to better estimate the needed extent of removal. This would be a 30 minute office procedure. Thank you

## 2017-02-13 NOTE — Telephone Encounter (Signed)
Spoke with the patient and she is amendable to this but does not know her scheduled yet. She will call back to schedule this.

## 2017-03-01 ENCOUNTER — Other Ambulatory Visit: Payer: Self-pay

## 2017-03-01 MED ORDER — VISMODEGIB 150 MG PO CAPS: 150.0000 mg | ORAL_CAPSULE | Freq: Every day | ORAL | 2 refills | Status: DC

## 2017-03-10 NOTE — Progress Notes (Signed)
[facility]  [phone] [phone]  ID: Patricia Galloway OB: 18-Oct-1950  MR#: [id number]  IRS#:[id number]  Patient Care Team: [name], MD as PCP - General (Family Medicine) [name], MD as Attending Physician (Family Medicine) Robert Bellow, MD (General Surgery)  CHIEF COMPLAINT: Iron deficiency anemia, locally advanced basal cell carcinoma of the right leg.  INTERVAL HISTORY: Patient returns to clinic today for repeat laboratory work and further evaluation. She discontinued Erivedge last week in preparation for surgery at the end of the month. She continues to complain of muscle cramping as well as a poor appetite and weight loss which she contributes to taste. She continues to have chronic fatigue. She otherwise feels well. She has no neurologic complaints. She denies any recent fevers. She has no chest pain or shortness of breath. She denies any vomiting, constipation, or diarrhea. She has no urinary complaints. Patient offers no further specific complaints today.  REVIEW OF SYSTEMS:   Review of Systems  Constitutional: Positive for malaise/fatigue and weight loss. Negative for fever.  Respiratory: Negative.  Negative for cough and shortness of breath.   Cardiovascular: Negative.  Negative for chest pain and leg swelling.  Gastrointestinal: Positive for nausea. Negative for abdominal pain and vomiting.  Genitourinary: Negative.   Musculoskeletal: Negative.        Muscle cramping  Skin: Negative.  Negative for rash.  Neurological: Positive for weakness. Negative for sensory change and focal weakness.  Psychiatric/Behavioral: Negative.  The patient is not nervous/anxious.     As per HPI. Otherwise, a complete review of systems is negative.  PAST MEDICAL HISTORY: Past Medical History:  Diagnosis Date  . Anxiety   . Asthma    as child , but no episodes in over 30 years  . Cancer [facility])     PAST SURGICAL HISTORY: Past  Surgical History:  Procedure Laterality Date  . COLON SURGERY  2002   colostomy for 10 months after a perfurated sigmoid   . COLOSTOMY REVERSAL  2003  . EMBOLIZATION Right 10/12/2016   Procedure: Embolization;  Surgeon: [name], MD;  Location: [facility];  Service: Cardiovascular;  Laterality: Right;  . etopic  [id number]  . TONSILLECTOMY      FAMILY HISTORY: Family History  Problem Relation Age of Onset  . Dementia Mother   . Aortic stenosis Mother   . Osteoporosis Mother   . Dementia Father   . Diabetes Father   . Hyperlipidemia Father   . Hypertension Father   . Aneurysm Sister   . Hyperlipidemia Sister   . Hypertension Sister   . Diabetes Sister     ADVANCED DIRECTIVES (Y/N):  N  HEALTH MAINTENANCE: Social History  Substance Use Topics  . Smoking status: Former Smoker    Packs/day: 0.50    Years: 25.00    Types: Cigarettes    Quit date: 08/03/1997  . Smokeless tobacco: Never Used  . Alcohol use No     Colonoscopy:  PAP:  Bone density:  Lipid panel:  Allergies  Allergen Reactions  . Penicillins Anaphylaxis  . Codeine Nausea And Vomiting    Percocet is ok to take  . Sulfur Other (See Comments)     Patient stated redness, discomfort, and made skin bleed  . Tape Other (See Comments)    Burns and peels  . Xopenex [Levalbuterol Hcl] Other (See Comments)    Makes her feel very anxious     Current Outpatient Prescriptions  Medication Sig Dispense  Refill  . albuterol (PROVENTIL HFA;VENTOLIN HFA) 108 (90 Base) MCG/ACT inhaler Inhale 2 puffs into the lungs every 6 (six) hours as needed for wheezing or shortness of breath. 1 Inhaler 1  . Ascorbic Acid (VITAMIN C) 100 MG tablet Take 800 mg by mouth daily.     . Cholecalciferol (VITAMIN D3) 1000 units CAPS Take 1,000 Units by mouth daily.     . Coenzyme Q10 100 MG TABS Take 1 capsule by mouth 2 (two) times daily.     . diazepam (VALIUM) 5 MG tablet Take 1 tablet (5 mg total) by mouth every 12  (twelve) hours as needed for anxiety. 30 tablet 0  . Digestive Enzyme CAPS Take 1 capsule by mouth 3 (three) times daily with meals.    . fluticasone furoate-vilanterol (BREO ELLIPTA) 100-25 MCG/INH AEPB Inhale 1 puff into the lungs daily. (Patient taking differently: Inhale 1 puff into the lungs as needed. ) 60 each 5  . Ginkgo Biloba Extract (GNP GINGKO BILOBA EXTRACT) 60 MG CAPS Take 1 capsule by mouth daily.    . magnesium 30 MG tablet Take 30 mg by mouth 2 (two) times daily.    . Multiple Vitamin (MULTIVITAMIN) capsule Take 2 capsules by mouth daily.     . ondansetron (ZOFRAN) 4 MG tablet Take 1 tablet (4 mg total) by mouth every 8 (eight) hours as needed for nausea or vomiting. 20 tablet 0  . OVER THE COUNTER MEDICATION Take 1 tablet by mouth 2 (two) times daily. Mushroom Stamets 7    . Probiotic Product (PROBIOTIC DAILY PO) Take 3 capsules by mouth at bedtime. 30 Billion = 3 caps    . Spirulina 500 MG TABS Take 1 tablet by mouth every morning.     No current facility-administered medications for this visit.     OBJECTIVE: Vitals:   03/13/17 1355  BP: (!) 198/115  Pulse: (!) 103  Temp: 97.7 F (36.5 C)     Body mass index is 17.33 kg/m.    ECOG FS:0 - Asymptomatic  General: Well-developed, well-nourished, no acute distress. Eyes: Pink conjunctiva, anicteric sclera. Lungs: Clear to auscultation bilaterally. Heart: Regular rate and rhythm. No rubs, murmurs, or gallops. Abdomen: Soft, nontender, nondistended. No organomegaly noted, normoactive bowel sounds. Musculoskeletal: No edema, cyanosis, or clubbing. Neuro: Alert, answering all questions appropriately. Cranial nerves grossly intact. Skin: Continued decrease in size of mass on right anterior medial leg, also noted is healthy granulation tissue on the more medial aspect of tumor. Psych: Normal affect.   LAB RESULTS:  Lab Results  Component Value Date   NA 134 (L) 03/13/2017   K 3.5 03/13/2017   CL 99 (L) 03/13/2017    CO2 26 03/13/2017   GLUCOSE 163 (H) 03/13/2017   BUN 16 03/13/2017   CREATININE 0.83 03/13/2017   CALCIUM 9.5 03/13/2017   PROT 7.2 03/13/2017   ALBUMIN 4.4 03/13/2017   AST 39 03/13/2017   ALT 21 03/13/2017   ALKPHOS 51 03/13/2017   BILITOT 0.5 03/13/2017   GFRNONAA >60 03/13/2017   GFRAA >60 03/13/2017    Lab Results  Component Value Date   WBC 8.9 03/13/2017   NEUTROABS 5.8 03/13/2017   HGB 13.7 03/13/2017   HCT 38.9 03/13/2017   MCV 90.8 03/13/2017   PLT 258 03/13/2017   Lab Results  Component Value Date   IRON 71 01/09/2017   TIBC 225 (L) 01/09/2017   IRONPCTSAT 32 (H) 01/09/2017   Lab Results  Component Value Date   FERRITIN  58 01/09/2017    STUDIES: No results found.  ASSESSMENT: Iron deficiency anemia, locally advanced basal cell carcinoma of the right leg.  PLAN:    1. Iron deficiency anemia: Patient's hemoglobin and iron stores are now within normal limits. She does not require blood transfusion or IV iron today. Patient reports she has had no bleeding since her embolization.  2. Basal cell carcinoma of the right leg: Patient states this lesion has been growing for 9 years and she did not seek medical attention. Diagnosis confirmed by biopsy from surgery. Previously, CT scan results reviewed independently and reported no obvious metastatic disease. Patient underwent embolization. She has had significant improvement of the size of her tumor with a combination of a hedgehog inhibitor, vismodegib (Erivedge) 150 mg daily and the embolization. She has discontinued treatment and has a biopsy scheduled with surgery later this week. She subsequently has surgery and skin grafting scheduled at the end of the month. It is unclear if she will need adjuvant treatment.  Return to clinic in 6 weeks for further evaluation and postop evaluation.  3. Hypertension: Patient's blood pressure remains significantly elevated. She does not appear to be on hypertensive medications.  Patient states her blood pressure is typically normal and attributes it to "white coat syndrome". Monitor. 4. Fatigue: Likely secondary to Erivedge, monitor. 5. Nausea: Continue current symptomatic treatment. 6. Muscle cramping: Unclear etiology. Monitor.  Approximately 30 minutes was spent in discussion of which greater than 50% was consultation.  Patient expressed understanding and was in agreement with this plan. She also understands that She can call clinic at any time with any questions, concerns, or complaints.   Cancer Staging Basal cell carcinoma, leg, right Staging form: Melanoma of the Skin, AJCC 7th Edition - Clinical: No stage assigned - Unsigned   Lloyd Huger, MD   03/13/2017 2:21 PM

## 2017-03-13 ENCOUNTER — Inpatient Hospital Stay: Payer: Medicare Other | Attending: Oncology | Admitting: Oncology

## 2017-03-13 ENCOUNTER — Other Ambulatory Visit: Payer: Self-pay

## 2017-03-13 ENCOUNTER — Inpatient Hospital Stay: Payer: Medicare Other

## 2017-03-13 ENCOUNTER — Encounter: Payer: Self-pay | Admitting: Oncology

## 2017-03-13 VITALS — BP 198/115 | HR 103 | Temp 97.7°F | Wt 99.4 lb

## 2017-03-13 DIAGNOSIS — R110 Nausea: Secondary | ICD-10-CM | POA: Diagnosis not present

## 2017-03-13 DIAGNOSIS — Z87891 Personal history of nicotine dependence: Secondary | ICD-10-CM | POA: Diagnosis not present

## 2017-03-13 DIAGNOSIS — F419 Anxiety disorder, unspecified: Secondary | ICD-10-CM | POA: Diagnosis not present

## 2017-03-13 DIAGNOSIS — R252 Cramp and spasm: Secondary | ICD-10-CM | POA: Diagnosis not present

## 2017-03-13 DIAGNOSIS — I10 Essential (primary) hypertension: Secondary | ICD-10-CM | POA: Insufficient documentation

## 2017-03-13 DIAGNOSIS — D509 Iron deficiency anemia, unspecified: Secondary | ICD-10-CM | POA: Diagnosis not present

## 2017-03-13 DIAGNOSIS — Z79899 Other long term (current) drug therapy: Secondary | ICD-10-CM | POA: Diagnosis not present

## 2017-03-13 DIAGNOSIS — J45909 Unspecified asthma, uncomplicated: Secondary | ICD-10-CM | POA: Diagnosis not present

## 2017-03-13 DIAGNOSIS — C44712 Basal cell carcinoma of skin of right lower limb, including hip: Secondary | ICD-10-CM | POA: Diagnosis not present

## 2017-03-13 LAB — CBC WITH DIFFERENTIAL/PLATELET
Basophils Absolute: 0.1 10*3/uL (ref 0–0.1)
Basophils Relative: 1 %
Eosinophils Absolute: 0.3 10*3/uL (ref 0–0.7)
Eosinophils Relative: 3 %
HEMATOCRIT: 38.9 % (ref 35.0–47.0)
HEMOGLOBIN: 13.7 g/dL (ref 12.0–16.0)
LYMPHS ABS: 2 10*3/uL (ref 1.0–3.6)
LYMPHS PCT: 23 %
MCH: 31.9 pg (ref 26.0–34.0)
MCHC: 35.1 g/dL (ref 32.0–36.0)
MCV: 90.8 fL (ref 80.0–100.0)
MONOS PCT: 8 %
Monocytes Absolute: 0.7 10*3/uL (ref 0.2–0.9)
NEUTROS PCT: 65 %
Neutro Abs: 5.8 10*3/uL (ref 1.4–6.5)
Platelets: 258 10*3/uL (ref 150–440)
RBC: 4.29 MIL/uL (ref 3.80–5.20)
RDW: 14.3 % (ref 11.5–14.5)
WBC: 8.9 10*3/uL (ref 3.6–11.0)

## 2017-03-13 LAB — MAGNESIUM: Magnesium: 2.2 mg/dL (ref 1.7–2.4)

## 2017-03-13 LAB — COMPREHENSIVE METABOLIC PANEL
ALK PHOS: 51 U/L (ref 38–126)
ALT: 21 U/L (ref 14–54)
AST: 39 U/L (ref 15–41)
Albumin: 4.4 g/dL (ref 3.5–5.0)
Anion gap: 9 (ref 5–15)
BILIRUBIN TOTAL: 0.5 mg/dL (ref 0.3–1.2)
BUN: 16 mg/dL (ref 6–20)
CALCIUM: 9.5 mg/dL (ref 8.9–10.3)
CO2: 26 mmol/L (ref 22–32)
CREATININE: 0.83 mg/dL (ref 0.44–1.00)
Chloride: 99 mmol/L — ABNORMAL LOW (ref 101–111)
Glucose, Bld: 163 mg/dL — ABNORMAL HIGH (ref 65–99)
Potassium: 3.5 mmol/L (ref 3.5–5.1)
Sodium: 134 mmol/L — ABNORMAL LOW (ref 135–145)
TOTAL PROTEIN: 7.2 g/dL (ref 6.5–8.1)

## 2017-03-15 ENCOUNTER — Ambulatory Visit (INDEPENDENT_AMBULATORY_CARE_PROVIDER_SITE_OTHER): Payer: Medicare Other | Admitting: General Surgery

## 2017-03-15 ENCOUNTER — Encounter: Payer: Self-pay | Admitting: General Surgery

## 2017-03-15 VITALS — BP 154/110 | HR 108 | Ht 63.0 in | Wt 99.0 lb

## 2017-03-15 DIAGNOSIS — C44712 Basal cell carcinoma of skin of right lower limb, including hip: Secondary | ICD-10-CM | POA: Diagnosis not present

## 2017-03-15 DIAGNOSIS — L986 Other infiltrative disorders of the skin and subcutaneous tissue: Secondary | ICD-10-CM | POA: Diagnosis not present

## 2017-03-15 NOTE — Progress Notes (Signed)
Patient ID: Patricia Galloway, female   DOB: [birth year], 66 y.o.   MRN: [id number]  Chief Complaint  Patient presents with  . Procedure    HPI Patricia Galloway is a 66 y.o. female here for a biopsy of a right thigh lesion.    HPI  Past Medical History:  Diagnosis Date  . Anxiety   . Asthma    as child , but no episodes in over 30 years  . Cancer [facility])     Past Surgical History:  Procedure Laterality Date  . COLON SURGERY  2002   colostomy for 10 months after a perfurated sigmoid   . COLOSTOMY REVERSAL  2003  . EMBOLIZATION Right 10/12/2016   Procedure: Embolization;  Surgeon: [name], MD;  Location: [facility];  Service: Cardiovascular;  Laterality: Right;  . etopic  3220,2542  . TONSILLECTOMY      Family History  Problem Relation Age of Onset  . Dementia Mother   . Aortic stenosis Mother   . Osteoporosis Mother   . Dementia Father   . Diabetes Father   . Hyperlipidemia Father   . Hypertension Father   . Aneurysm Sister   . Hyperlipidemia Sister   . Hypertension Sister   . Diabetes Sister     Social History Social History  Substance Use Topics  . Smoking status: Former Smoker    Packs/day: 0.50    Years: 25.00    Types: Cigarettes    Quit date: 08/03/1997  . Smokeless tobacco: Never Used  . Alcohol use No    Allergies  Allergen Reactions  . Penicillins Anaphylaxis  . Codeine Nausea And Vomiting    Percocet is ok to take  . Latex Itching  . Sulfur Other (See Comments)     Patient stated redness, discomfort, and made skin bleed  . Tape Other (See Comments)    Burns and peels  . Xopenex [Levalbuterol Hcl] Other (See Comments)    Makes her feel very anxious     Current Outpatient Prescriptions  Medication Sig Dispense Refill  . Ascorbic Acid (VITAMIN C) 100 MG tablet Take 800 mg by mouth daily.     . Cholecalciferol (VITAMIN D3) 1000 units CAPS Take 1,000 Units by mouth daily.     . Coenzyme Q10 100 MG TABS Take 1 capsule by mouth  2 (two) times daily.     . diazepam (VALIUM) 5 MG tablet Take 1 tablet (5 mg total) by mouth every 12 (twelve) hours as needed for anxiety. 30 tablet 0  . Digestive Enzyme CAPS Take 1 capsule by mouth 3 (three) times daily with meals.    . fluticasone furoate-vilanterol (BREO ELLIPTA) 100-25 MCG/INH AEPB Inhale 1 puff into the lungs daily. (Patient taking differently: Inhale 1 puff into the lungs as needed. ) 60 each 5  . Ginkgo Biloba Extract (GNP GINGKO BILOBA EXTRACT) 60 MG CAPS Take 1 capsule by mouth daily.    . magnesium 30 MG tablet Take 30 mg by mouth 2 (two) times daily.    . Multiple Vitamin (MULTIVITAMIN) capsule Take 2 capsules by mouth daily.     Marland Kitchen OVER THE COUNTER MEDICATION Take 1 tablet by mouth 2 (two) times daily. Mushroom Stamets 7    . Probiotic Product (PROBIOTIC DAILY PO) Take 3 capsules by mouth at bedtime. 70 Billion = 3 caps    . Spirulina 500 MG TABS Take 1 tablet by mouth every morning.    Marland Kitchen albuterol (PROVENTIL HFA;VENTOLIN HFA) 108 (90  Base) MCG/ACT inhaler Inhale 2 puffs into the lungs every 6 (six) hours as needed for wheezing or shortness of breath. (Patient not taking: Reported on 03/15/2017) 1 Inhaler 1  . ondansetron (ZOFRAN) 4 MG tablet Take 1 tablet (4 mg total) by mouth every 8 (eight) hours as needed for nausea or vomiting. (Patient not taking: Reported on 03/15/2017) 20 tablet 0   No current facility-administered medications for this visit.     Review of Systems Review of Systems  Constitutional: Negative.   Respiratory: Negative.   Cardiovascular: Negative.     Blood pressure (!) 154/110, pulse (!) 108, height 5\' 3"  (1.6 m), weight 99 lb (44.9 kg).  Physical Exam Physical Exam  Constitutional: She is oriented to person, place, and time. She appears well-developed and well-nourished.  Musculoskeletal:       Legs: Neurological: She is alert and oriented to person, place, and time.  Skin: Skin is warm and dry.  10.5 x 10 cm   Psychiatric: She has a  normal mood and affect.    Data Reviewed The area was prepped with alcohol followed by 5 mL of 0.5% Xylocaine with 0.25% Marcaine with 1-200,000 epinephrine. 2.5 mm biopsies were taken at the 12, 3, 6 and 9:00 positions approximately 1 cm from the edge of visible malignancy. Defects were closed with simple 4-0 Prolene sutures followed by Telfa and Tegaderm dressings (these may be removed in 24 hours if desired) sees.  Assessment    Basal cell carcinoma of the right distal medial thigh.    Plan    Stage resection with coverage with graft versus flap to be scheduled.  Will contact plastic surgery for their input regarding possible flap coverage    HPI, Physical Exam, Assessment and Plan have been scribed under the direction and in the presence of Robert Bellow, MD  Concepcion Living, LPN   Robert Bellow 03/15/2017, 9:48 PM

## 2017-03-15 NOTE — Patient Instructions (Addendum)
You may shower. You may remove the dressing if it bothers you. Return in 7 days for suture removal. We will call with results.

## 2017-03-21 ENCOUNTER — Telehealth: Payer: Self-pay

## 2017-03-21 ENCOUNTER — Other Ambulatory Visit: Payer: Self-pay

## 2017-03-21 DIAGNOSIS — C44712 Basal cell carcinoma of skin of right lower limb, including hip: Secondary | ICD-10-CM

## 2017-03-21 NOTE — Telephone Encounter (Signed)
-----   Message from Robert Bellow, MD sent at 03/21/2017  6:56 AM EDT -----  I have spoken w/ Dr. Audelia Hives from plastic surgery in [place] about this patient. I would like for Ms. Kotula to see her before we schedule surgery to determine the best way to close the large hole where the tumor is going to be removed.  ----- Message ----- From: Interface, Lab In [facility] Sent: 03/20/2017   5:06 PM To: Robert Bellow, MD

## 2017-03-21 NOTE — Telephone Encounter (Signed)
Spoke with patient about seeing Dr Marla Roe with plastic surgery and she is amendable to this. She is scheduled to see her on 03/23/17 at 4:15 pm. The patient is aware of date, time, and location.

## 2017-03-22 ENCOUNTER — Ambulatory Visit (INDEPENDENT_AMBULATORY_CARE_PROVIDER_SITE_OTHER): Payer: Medicare Other | Admitting: *Deleted

## 2017-03-22 DIAGNOSIS — C44712 Basal cell carcinoma of skin of right lower limb, including hip: Secondary | ICD-10-CM

## 2017-03-22 NOTE — Progress Notes (Signed)
Patient ID: Patricia Galloway, female   DOB: 10/21/1950, 66 y.o.   MRN: [id number]  Patient came in today for a wound check /suture removal.  The wound is clean, with no signs of infection noted. 4 sutures were removed. She states she received call from MD about clear margins. Follow up as scheduled.

## 2017-03-22 NOTE — Patient Instructions (Signed)
The patient is aware to call back for any questions or concerns.  

## 2017-03-23 DIAGNOSIS — C44712 Basal cell carcinoma of skin of right lower limb, including hip: Secondary | ICD-10-CM | POA: Diagnosis not present

## 2017-03-24 ENCOUNTER — Encounter: Payer: Self-pay | Admitting: General Surgery

## 2017-03-25 NOTE — Progress Notes (Signed)
The patient's case was reviewed in detail with Dr. Marla Roe from plastics.  Agreement that 2 step procedure would be appropriate.  Dr. Marla Roe has expressed an interest to complete both the wide excision and the delayed grafting. This is acceptable to me, and she will contact the patient to arrange scheduling.  The need to confirm the patient has discontinued her oral chemotherapy agent was reviewed.

## 2017-03-28 ENCOUNTER — Ambulatory Visit: Payer: Self-pay | Admitting: Plastic Surgery

## 2017-03-28 ENCOUNTER — Encounter (HOSPITAL_COMMUNITY): Payer: Self-pay | Admitting: *Deleted

## 2017-03-28 DIAGNOSIS — C44712 Basal cell carcinoma of skin of right lower limb, including hip: Secondary | ICD-10-CM

## 2017-03-28 NOTE — Progress Notes (Signed)
Pt denies cardiac history, chest pain or sob. Pt states she has a 4.1cm aortic aneurysm that was found on a CT scan in December, 2017. She states all her physicians are aware of this and she has been told it's small and not a concern at this time. I spoke with Dr. Nyoka Cowden, Anesthesiologist to make her aware of this and she states that it's not the size to be concerned with. She asked if there was an EKG in [facility] on pt and I told her that there was.

## 2017-03-29 ENCOUNTER — Encounter (HOSPITAL_COMMUNITY)
Admission: RE | Disposition: A | Discharge status: Home / Self Care | Payer: Self-pay | Source: Ambulatory Visit | Attending: Plastic Surgery

## 2017-03-29 ENCOUNTER — Encounter (HOSPITAL_COMMUNITY): Payer: Self-pay

## 2017-03-29 ENCOUNTER — Ambulatory Visit (HOSPITAL_COMMUNITY)
Admission: RE | Admit: 2017-03-29 | Discharge: 2017-03-29 | Disposition: A | Discharge status: Home / Self Care | Payer: Medicare Other | Source: Ambulatory Visit | Attending: Plastic Surgery | Admitting: Plastic Surgery

## 2017-03-29 ENCOUNTER — Encounter (HOSPITAL_COMMUNITY): Payer: Self-pay | Admitting: *Deleted

## 2017-03-29 ENCOUNTER — Ambulatory Visit (HOSPITAL_COMMUNITY): Payer: Medicare Other | Admitting: Anesthesiology

## 2017-03-29 ENCOUNTER — Inpatient Hospital Stay (HOSPITAL_COMMUNITY)
Admission: AD | Admit: 2017-03-29 | Discharge: 2017-04-01 | DRG: 579 | Disposition: A | Discharge status: Home Health | Payer: Medicare Other | Source: Ambulatory Visit | Attending: Plastic Surgery | Admitting: Plastic Surgery

## 2017-03-29 ENCOUNTER — Other Ambulatory Visit: Payer: Self-pay | Admitting: Plastic Surgery

## 2017-03-29 DIAGNOSIS — Z8249 Family history of ischemic heart disease and other diseases of the circulatory system: Secondary | ICD-10-CM | POA: Diagnosis not present

## 2017-03-29 DIAGNOSIS — Z880 Allergy status to penicillin: Secondary | ICD-10-CM

## 2017-03-29 DIAGNOSIS — Z91040 Latex allergy status: Secondary | ICD-10-CM | POA: Diagnosis not present

## 2017-03-29 DIAGNOSIS — I10 Essential (primary) hypertension: Secondary | ICD-10-CM | POA: Diagnosis not present

## 2017-03-29 DIAGNOSIS — E43 Unspecified severe protein-calorie malnutrition: Secondary | ICD-10-CM | POA: Insufficient documentation

## 2017-03-29 DIAGNOSIS — Z882 Allergy status to sulfonamides status: Secondary | ICD-10-CM

## 2017-03-29 DIAGNOSIS — Z9221 Personal history of antineoplastic chemotherapy: Secondary | ICD-10-CM

## 2017-03-29 DIAGNOSIS — I739 Peripheral vascular disease, unspecified: Secondary | ICD-10-CM | POA: Diagnosis present

## 2017-03-29 DIAGNOSIS — L7622 Postprocedural hemorrhage and hematoma of skin and subcutaneous tissue following other procedure: Secondary | ICD-10-CM | POA: Diagnosis not present

## 2017-03-29 DIAGNOSIS — Z8349 Family history of other endocrine, nutritional and metabolic diseases: Secondary | ICD-10-CM

## 2017-03-29 DIAGNOSIS — Z888 Allergy status to other drugs, medicaments and biological substances status: Secondary | ICD-10-CM

## 2017-03-29 DIAGNOSIS — J439 Emphysema, unspecified: Secondary | ICD-10-CM | POA: Diagnosis present

## 2017-03-29 DIAGNOSIS — Z833 Family history of diabetes mellitus: Secondary | ICD-10-CM | POA: Diagnosis not present

## 2017-03-29 DIAGNOSIS — Z87891 Personal history of nicotine dependence: Secondary | ICD-10-CM | POA: Diagnosis not present

## 2017-03-29 DIAGNOSIS — L7621 Postprocedural hemorrhage and hematoma of skin and subcutaneous tissue following a dermatologic procedure: Secondary | ICD-10-CM | POA: Diagnosis not present

## 2017-03-29 DIAGNOSIS — C44712 Basal cell carcinoma of skin of right lower limb, including hip: Principal | ICD-10-CM | POA: Diagnosis present

## 2017-03-29 DIAGNOSIS — T8789 Other complications of amputation stump: Secondary | ICD-10-CM | POA: Diagnosis not present

## 2017-03-29 DIAGNOSIS — I719 Aortic aneurysm of unspecified site, without rupture: Secondary | ICD-10-CM | POA: Diagnosis present

## 2017-03-29 DIAGNOSIS — F419 Anxiety disorder, unspecified: Secondary | ICD-10-CM | POA: Diagnosis present

## 2017-03-29 DIAGNOSIS — M79661 Pain in right lower leg: Secondary | ICD-10-CM | POA: Diagnosis not present

## 2017-03-29 DIAGNOSIS — F4321 Adjustment disorder with depressed mood: Secondary | ICD-10-CM | POA: Diagnosis present

## 2017-03-29 DIAGNOSIS — D649 Anemia, unspecified: Secondary | ICD-10-CM | POA: Diagnosis not present

## 2017-03-29 DIAGNOSIS — Z8262 Family history of osteoporosis: Secondary | ICD-10-CM

## 2017-03-29 DIAGNOSIS — Y839 Surgical procedure, unspecified as the cause of abnormal reaction of the patient, or of later complication, without mention of misadventure at the time of the procedure: Secondary | ICD-10-CM | POA: Diagnosis not present

## 2017-03-29 DIAGNOSIS — Z91048 Other nonmedicinal substance allergy status: Secondary | ICD-10-CM

## 2017-03-29 DIAGNOSIS — Z885 Allergy status to narcotic agent status: Secondary | ICD-10-CM

## 2017-03-29 DIAGNOSIS — C4491 Basal cell carcinoma of skin, unspecified: Secondary | ICD-10-CM | POA: Diagnosis present

## 2017-03-29 DIAGNOSIS — M79609 Pain in unspecified limb: Secondary | ICD-10-CM

## 2017-03-29 DIAGNOSIS — Z818 Family history of other mental and behavioral disorders: Secondary | ICD-10-CM

## 2017-03-29 DIAGNOSIS — Z681 Body mass index (BMI) 19 or less, adult: Secondary | ICD-10-CM | POA: Diagnosis not present

## 2017-03-29 DIAGNOSIS — K861 Other chronic pancreatitis: Secondary | ICD-10-CM | POA: Diagnosis not present

## 2017-03-29 DIAGNOSIS — L905 Scar conditions and fibrosis of skin: Secondary | ICD-10-CM | POA: Diagnosis not present

## 2017-03-29 DIAGNOSIS — R911 Solitary pulmonary nodule: Secondary | ICD-10-CM | POA: Diagnosis not present

## 2017-03-29 DIAGNOSIS — D62 Acute posthemorrhagic anemia: Secondary | ICD-10-CM | POA: Diagnosis not present

## 2017-03-29 DIAGNOSIS — J45909 Unspecified asthma, uncomplicated: Secondary | ICD-10-CM | POA: Diagnosis not present

## 2017-03-29 DIAGNOSIS — J4520 Mild intermittent asthma, uncomplicated: Secondary | ICD-10-CM | POA: Diagnosis not present

## 2017-03-29 HISTORY — DX: Anemia, unspecified: D64.9

## 2017-03-29 HISTORY — DX: Aortic aneurysm of unspecified site, without rupture: I71.9

## 2017-03-29 HISTORY — PX: BASAL CELL CARCINOMA EXCISION: SHX1214

## 2017-03-29 HISTORY — DX: Personal history of other medical treatment: Z92.89

## 2017-03-29 LAB — CBC WITH DIFFERENTIAL/PLATELET
BASOS ABS: 0.1 10*3/uL (ref 0.0–0.1)
Basophils Relative: 1 %
EOS ABS: 0.4 10*3/uL (ref 0.0–0.7)
EOS PCT: 4 %
HCT: 40.4 % (ref 36.0–46.0)
Hemoglobin: 14.4 g/dL (ref 12.0–15.0)
LYMPHS ABS: 1.9 10*3/uL (ref 0.7–4.0)
Lymphocytes Relative: 19 %
MCH: 31.6 pg (ref 26.0–34.0)
MCHC: 35.6 g/dL (ref 30.0–36.0)
MCV: 88.8 fL (ref 78.0–100.0)
MONO ABS: 0.9 10*3/uL (ref 0.1–1.0)
Monocytes Relative: 10 %
Neutro Abs: 6.6 10*3/uL (ref 1.7–7.7)
Neutrophils Relative %: 66 %
PLATELETS: 266 10*3/uL (ref 150–400)
RBC: 4.55 MIL/uL (ref 3.87–5.11)
RDW: 13.5 % (ref 11.5–15.5)
WBC: 9.9 10*3/uL (ref 4.0–10.5)

## 2017-03-29 LAB — COMPREHENSIVE METABOLIC PANEL
ALT: 20 U/L (ref 14–54)
AST: 33 U/L (ref 15–41)
Albumin: 4.5 g/dL (ref 3.5–5.0)
Alkaline Phosphatase: 58 U/L (ref 38–126)
Anion gap: 12 (ref 5–15)
BUN: 10 mg/dL (ref 6–20)
CHLORIDE: 102 mmol/L (ref 101–111)
CO2: 24 mmol/L (ref 22–32)
CREATININE: 0.71 mg/dL (ref 0.44–1.00)
Calcium: 9.5 mg/dL (ref 8.9–10.3)
GFR calc non Af Amer: >60 mL/min (ref >60)
Glucose, Bld: 106 mg/dL — ABNORMAL HIGH (ref 65–99)
POTASSIUM: 3.9 mmol/L (ref 3.5–5.1)
SODIUM: 138 mmol/L (ref 135–145)
Total Bilirubin: 0.6 mg/dL (ref 0.3–1.2)
Total Protein: 7.3 g/dL (ref 6.5–8.1)

## 2017-03-29 LAB — CBC
HEMATOCRIT: 35.6 % — AB (ref 36.0–46.0)
Hemoglobin: 12.5 g/dL (ref 12.0–15.0)
MCH: 31.4 pg (ref 26.0–34.0)
MCHC: 35.1 g/dL (ref 30.0–36.0)
MCV: 89.4 fL (ref 78.0–100.0)
Platelets: 235 10*3/uL (ref 150–400)
RBC: 3.98 MIL/uL (ref 3.87–5.11)
RDW: 13.5 % (ref 11.5–15.5)
WBC: 13.5 10*3/uL — ABNORMAL HIGH (ref 4.0–10.5)

## 2017-03-29 SURGERY — EXCISION, CARCINOMA, BASAL CELL, WITH FROZEN SECTION EXAMINATION
Anesthesia: General | Site: Leg Upper | Laterality: Right

## 2017-03-29 MED ORDER — FENTANYL CITRATE (PF) 100 MCG/2ML IJ SOLN
INTRAMUSCULAR | Status: AC
  Filled 2017-03-29: qty 2

## 2017-03-29 MED ORDER — ONDANSETRON 4 MG PO TBDP: 4.0000 mg | ORAL_TABLET | Freq: Four times a day (QID) | ORAL | Status: DC | PRN

## 2017-03-29 MED ORDER — OXYCODONE-ACETAMINOPHEN 5-325 MG PO TABS
1.0000 | ORAL_TABLET | Freq: Four times a day (QID) | ORAL | 0 refills | Status: DC | PRN
Start: 2017-03-29 — End: 2017-12-07

## 2017-03-29 MED ORDER — CIPROFLOXACIN IN D5W 400 MG/200ML IV SOLN
INTRAVENOUS | Status: AC
  Filled 2017-03-29: qty 200

## 2017-03-29 MED ORDER — OXYCODONE-ACETAMINOPHEN 5-325 MG PO TABS
1.0000 | ORAL_TABLET | ORAL | Status: DC | PRN
  Administered 2017-03-29: 1 via ORAL
  Administered 2017-03-30 (×2): 2 via ORAL
  Administered 2017-03-30: 1 via ORAL
  Filled 2017-03-29: qty 1
  Filled 2017-03-29 (×3): qty 2

## 2017-03-29 MED ORDER — LIDOCAINE 2% (20 MG/ML) 5 ML SYRINGE
INTRAMUSCULAR | Status: AC
  Filled 2017-03-29: qty 5

## 2017-03-29 MED ORDER — LIDOCAINE-EPINEPHRINE 1 %-1:100000 IJ SOLN
INTRAMUSCULAR | Status: DC | PRN
  Administered 2017-03-29 (×2): 10 mL

## 2017-03-29 MED ORDER — LIDOCAINE HCL (CARDIAC) 20 MG/ML IV SOLN
INTRAVENOUS | Status: DC | PRN
  Administered 2017-03-29: 100 mg via INTRAVENOUS

## 2017-03-29 MED ORDER — KETOROLAC TROMETHAMINE 30 MG/ML IJ SOLN
INTRAMUSCULAR | Status: AC
  Filled 2017-03-29: qty 1

## 2017-03-29 MED ORDER — MIDAZOLAM HCL 5 MG/5ML IJ SOLN
INTRAMUSCULAR | Status: DC | PRN
  Administered 2017-03-29 (×2): 1 mg via INTRAVENOUS

## 2017-03-29 MED ORDER — GLYCOPYRROLATE 0.2 MG/ML IJ SOLN
INTRAMUSCULAR | Status: DC | PRN
  Administered 2017-03-29: 0.2 mg via INTRAVENOUS

## 2017-03-29 MED ORDER — ONDANSETRON HCL 4 MG/2ML IJ SOLN
INTRAMUSCULAR | Status: AC
  Filled 2017-03-29: qty 2

## 2017-03-29 MED ORDER — CIPROFLOXACIN HCL 500 MG PO TABS: 500.0000 mg | ORAL_TABLET | Freq: Two times a day (BID) | ORAL | 0 refills | Status: DC

## 2017-03-29 MED ORDER — CIPROFLOXACIN IN D5W 400 MG/200ML IV SOLN
400.0000 mg | Freq: Two times a day (BID) | INTRAVENOUS | Status: DC
  Administered 2017-03-29 – 2017-04-01 (×6): 400 mg via INTRAVENOUS
  Filled 2017-03-29 (×6): qty 200

## 2017-03-29 MED ORDER — IBUPROFEN 200 MG PO TABS
200.0000 mg | ORAL_TABLET | Freq: Four times a day (QID) | ORAL | Status: DC | PRN
  Filled 2017-03-29: qty 2

## 2017-03-29 MED ORDER — MEPERIDINE HCL 25 MG/ML IJ SOLN: 6.2500 mg | INTRAMUSCULAR | Status: DC | PRN

## 2017-03-29 MED ORDER — LIDOCAINE-EPINEPHRINE 1 %-1:100000 IJ SOLN
INTRAMUSCULAR | Status: AC
  Filled 2017-03-29: qty 1

## 2017-03-29 MED ORDER — FENTANYL CITRATE (PF) 100 MCG/2ML IJ SOLN
INTRAMUSCULAR | Status: DC | PRN
  Administered 2017-03-29 (×3): 50 ug via INTRAVENOUS

## 2017-03-29 MED ORDER — ONDANSETRON HCL 4 MG/2ML IJ SOLN
INTRAMUSCULAR | Status: DC | PRN
  Administered 2017-03-29: 4 mg via INTRAVENOUS

## 2017-03-29 MED ORDER — KETOROLAC TROMETHAMINE 30 MG/ML IJ SOLN
30.0000 mg | Freq: Once | INTRAMUSCULAR | Status: DC | PRN
  Administered 2017-03-29: 30 mg via INTRAVENOUS

## 2017-03-29 MED ORDER — FENTANYL CITRATE (PF) 100 MCG/2ML IJ SOLN
25.0000 ug | INTRAMUSCULAR | Status: DC | PRN
  Administered 2017-03-29 (×3): 50 ug via INTRAVENOUS

## 2017-03-29 MED ORDER — EPHEDRINE 5 MG/ML INJ
INTRAVENOUS | Status: AC
  Filled 2017-03-29: qty 10

## 2017-03-29 MED ORDER — MIDAZOLAM HCL 2 MG/2ML IJ SOLN
INTRAMUSCULAR | Status: AC
  Filled 2017-03-29: qty 2

## 2017-03-29 MED ORDER — LACTATED RINGERS IV SOLN
INTRAVENOUS | Status: DC | PRN
  Administered 2017-03-29 (×2): via INTRAVENOUS

## 2017-03-29 MED ORDER — CIPROFLOXACIN IN D5W 400 MG/200ML IV SOLN
400.0000 mg | INTRAVENOUS | Status: AC
  Administered 2017-03-29: 400 mg via INTRAVENOUS

## 2017-03-29 MED ORDER — PROPOFOL 10 MG/ML IV BOLUS
INTRAVENOUS | Status: DC | PRN
  Administered 2017-03-29: 20 mg via INTRAVENOUS
  Administered 2017-03-29: 130 mg via INTRAVENOUS

## 2017-03-29 MED ORDER — MORPHINE SULFATE (PF) 2 MG/ML IV SOLN
2.0000 mg | INTRAVENOUS | Status: DC | PRN
  Administered 2017-03-29 – 2017-03-30 (×5): 2 mg via INTRAVENOUS
  Filled 2017-03-29 (×4): qty 1

## 2017-03-29 MED ORDER — 0.9 % SODIUM CHLORIDE (POUR BTL) OPTIME
TOPICAL | Status: DC | PRN
  Administered 2017-03-29: 1000 mL

## 2017-03-29 MED ORDER — KCL IN DEXTROSE-NACL 20-5-0.45 MEQ/L-%-% IV SOLN
INTRAVENOUS | Status: DC
  Administered 2017-03-29 – 2017-03-31 (×3): via INTRAVENOUS
  Filled 2017-03-29 (×7): qty 1000

## 2017-03-29 MED ORDER — ONDANSETRON HCL 4 MG/2ML IJ SOLN
4.0000 mg | Freq: Once | INTRAMUSCULAR | Status: DC | PRN
Start: 2017-03-29 — End: 2017-03-29

## 2017-03-29 MED ORDER — EPHEDRINE SULFATE 50 MG/ML IJ SOLN
INTRAMUSCULAR | Status: DC | PRN
  Administered 2017-03-29 (×2): 10 mg via INTRAVENOUS

## 2017-03-29 MED ORDER — FENTANYL CITRATE (PF) 250 MCG/5ML IJ SOLN
INTRAMUSCULAR | Status: AC
  Filled 2017-03-29: qty 5

## 2017-03-29 MED ORDER — ACETAMINOPHEN 500 MG PO TABS
1000.0000 mg | ORAL_TABLET | Freq: Four times a day (QID) | ORAL | Status: DC
  Filled 2017-03-29: qty 2

## 2017-03-29 MED ORDER — SODIUM CHLORIDE 0.9 % IV SOLN
INTRAVENOUS | Status: DC | PRN
  Administered 2017-03-29: 500 mL

## 2017-03-29 MED ORDER — PROPOFOL 10 MG/ML IV BOLUS
INTRAVENOUS | Status: AC
  Filled 2017-03-29: qty 20

## 2017-03-29 MED ORDER — MORPHINE SULFATE (PF) 2 MG/ML IV SOLN
INTRAVENOUS | Status: AC
  Filled 2017-03-29: qty 1

## 2017-03-29 MED ORDER — ONDANSETRON HCL 4 MG/2ML IJ SOLN
4.0000 mg | Freq: Four times a day (QID) | INTRAMUSCULAR | Status: DC | PRN
  Administered 2017-03-30: 4 mg via INTRAVENOUS

## 2017-03-29 MED ORDER — IBUPROFEN 100 MG/5ML PO SUSP
200.0000 mg | Freq: Four times a day (QID) | ORAL | Status: DC | PRN
  Filled 2017-03-29: qty 20

## 2017-03-29 SURGICAL SUPPLY — 33 items
BANDAGE ACE 6X5 VEL STRL LF (GAUZE/BANDAGES/DRESSINGS) ×3 IMPLANT
BNDG COHESIVE 6X5 TAN STRL LF (GAUZE/BANDAGES/DRESSINGS) ×3 IMPLANT
BNDG GAUZE ELAST 4 BULKY (GAUZE/BANDAGES/DRESSINGS) ×6 IMPLANT
CANISTER WOUND CARE 500ML ATS (WOUND CARE) ×3 IMPLANT
COVER SURGICAL LIGHT HANDLE (MISCELLANEOUS) ×3 IMPLANT
DRAPE INCISE IOBAN 66X45 STRL (DRAPES) IMPLANT
DRSG ADAPTIC 3X8 NADH LF (GAUZE/BANDAGES/DRESSINGS) IMPLANT
DRSG CUTIMED SORBACT 7X9 (GAUZE/BANDAGES/DRESSINGS) ×3 IMPLANT
DRSG VAC ATS LRG SENSATRAC (GAUZE/BANDAGES/DRESSINGS) IMPLANT
DRSG VAC ATS MED SENSATRAC (GAUZE/BANDAGES/DRESSINGS) IMPLANT
DRSG VAC ATS SM SENSATRAC (GAUZE/BANDAGES/DRESSINGS) IMPLANT
ELECT CAUTERY BLADE 6.4 (BLADE) ×3 IMPLANT
ELECT REM PT RETURN 9FT ADLT (ELECTROSURGICAL) ×3
ELECTRODE REM PT RTRN 9FT ADLT (ELECTROSURGICAL) ×2 IMPLANT
GAUZE SPONGE 4X4 16PLY XRAY LF (GAUZE/BANDAGES/DRESSINGS) ×3 IMPLANT
GEL ULTRASOUND 20GR AQUASONIC (MISCELLANEOUS) IMPLANT
GLOVE BIO SURGEON STRL SZ 6.5 (GLOVE) ×6 IMPLANT
GOWN STRL REUS W/ TWL LRG LVL3 (GOWN DISPOSABLE) ×6 IMPLANT
GOWN STRL REUS W/TWL LRG LVL3 (GOWN DISPOSABLE) ×3
KIT BASIN OR (CUSTOM PROCEDURE TRAY) ×3 IMPLANT
NEEDLE 22X1 1/2 (OR ONLY) (NEEDLE) ×3 IMPLANT
PACK ORTHO EXTREMITY (CUSTOM PROCEDURE TRAY) ×3 IMPLANT
PAD ABD 8X10 STRL (GAUZE/BANDAGES/DRESSINGS) ×12 IMPLANT
STAPLER VISISTAT 35W (STAPLE) IMPLANT
STOCKINETTE IMPERVIOUS 9X36 MD (GAUZE/BANDAGES/DRESSINGS) IMPLANT
STOCKINETTE IMPERVIOUS LG (DRAPES) ×3 IMPLANT
SUT SILK 4 0 P 3 (SUTURE) IMPLANT
SUT VIC AB 5-0 PS2 18 (SUTURE) IMPLANT
SYR CONTROL 10ML LL (SYRINGE) ×3 IMPLANT
TOWEL OR 17X24 6PK STRL BLUE (TOWEL DISPOSABLE) IMPLANT
TOWEL OR 17X26 10 PK STRL BLUE (TOWEL DISPOSABLE) IMPLANT
TUBE CONNECTING 12X1/4 (SUCTIONS) ×3 IMPLANT
YANKAUER SUCT BULB TIP NO VENT (SUCTIONS) ×3 IMPLANT

## 2017-03-29 NOTE — Anesthesia Postprocedure Evaluation (Signed)
Anesthesia Post Note  Patient: Patricia Galloway  Procedure(s) Performed: Procedure(s) (LRB): EXCISION OF RIGHT LEG BASEL CELL CARCINOMA (Right)     Patient location during evaluation: PACU Anesthesia Type: General Level of consciousness: awake Pain management: pain level controlled Vital Signs Assessment: post-procedure vital signs reviewed and stable Respiratory status: spontaneous breathing Cardiovascular status: stable Postop Assessment: no signs of nausea or vomiting Anesthetic complications: no    Last Vitals:  Vitals:   03/29/17 1345 03/29/17 1355  BP: 130/64 125/62  Pulse: 74 80  Resp: 13 14  Temp:  36.8 C    Last Pain:  Vitals:   03/29/17 1330  TempSrc:   PainSc: 4    Pain Goal: Patients Stated Pain Goal: 4 (03/29/17 1330)               Ahley Bulls JR,JOHN Mateo Flow

## 2017-03-29 NOTE — Op Note (Signed)
DATE OF OPERATION: 03/29/2017  LOCATION: [facility]  PREOPERATIVE DIAGNOSIS: Right leg basal cell carcinoma tumor  POSTOPERATIVE DIAGNOSIS: Same  PROCEDURE: Resection of right thigh basal cell carcinoma 14 x 14 cm  SURGEON: Claire Sanger Dillingham, DO  ASSISTANT: Shawn Rayburn, PA  EBL: 20 cc  CONDITION: Stable  COMPLICATIONS: None  INDICATION: The patient, Patricia Galloway, is a 66 y.o. female born on 1950-09-24, is here for treatment of a right thigh tumor.  It has been present for at least 4 years.   PROCEDURE DETAILS:  The patient was seen prior to surgery and marked.  The IV antibiotics were given. The patient was taken to the operating room and given a general anesthetic. A standard time out was performed and all information was confirmed by those in the room. SCD was placed on the left leg.   The leg was prepped and draped in the usual sterile fashion.  Local was injected for intraoperative hemostasis and postoperative pain control. Sutures were placed for path.  Short stitch at 12 o'clock. Two short sutures at 3 o'clock.  Long single stitch as 6 o'clock.  Double long suture at the 9 o'clock.  The #10 blade and bovie was used to resect the 14 x 14 cm tumor.  The central deep margin was excised with a short stitch at 12 o'clock. Long stitch at 3 o'clock.  Double long at the 10 o'clock extension.  Hemostasis was achieved with electrocautery.  The area was irrigated with antibiotic solution and saline.  The sorbact was applied and secured with the 3-0 Silk.  The leg was wrapped with ABD, kerlex and cobane.  The patient was allowed to wake up and taken to recovery room in stable condition at the end of the case. The family was notified at the end of the case.

## 2017-03-29 NOTE — Progress Notes (Signed)
50 of fentanyl wasted in sink with Engineer, mining in [employer]

## 2017-03-29 NOTE — H&P (Signed)
Patricia Galloway is an 66 y.o. female.   Chief Complaint: basal cell carcinoma of the right thigh HPI: The patient is a 66 yrs old wf here for treatment of a right thigh. 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  Her parents have passed away and she was seen in [place].  Biopsies were done for a diagnosis which was found to be a large basal cell carcinoma.  She has undergone chemo with a positive response.  Additional biopsies of the periphery were done last month and were negative.  She presents for excision of a large skin cancer on the anterior aspect of her right thigh.  Past Medical History:  Diagnosis Date  . Anemia    has had iron infusion  . Anxiety    complicated grief  . Aortic aneurysm ([place])    found on CT in December, 2017  . Asthma    as child , but no episodes in over 30 years  . Cancer ([place]) 08/2016   basal cell carcinoma - right leg  . History of transfusion of packed red blood cells     Past Surgical History:  Procedure Laterality Date  . COLON SURGERY  2002   colostomy for 10 months after a perfurated sigmoid   . COLOSTOMY REVERSAL  2003  . EMBOLIZATION Right 10/12/2016   Procedure: Embolization;  Surgeon: [name], MD;  Location: [facility];  Service: Cardiovascular;  Laterality: Right;  . etopic  [id number]  . TONSILLECTOMY      Family History  Problem Relation Age of Onset  . Dementia Mother   . Aortic stenosis Mother   . Osteoporosis Mother   . Dementia Father   . Diabetes Father   . Hyperlipidemia Father   . Hypertension Father   . Aneurysm Sister   . Hyperlipidemia Sister   . Hypertension Sister   . Diabetes Sister    Social History:  reports that she quit smoking about 18 years ago. Her smoking use included Cigarettes. She has a 12.50 pack-year smoking history. She has never used smokeless tobacco. She reports that she does not drink alcohol or use drugs.  Allergies:  Allergies    Allergen Reactions  . Penicillins Anaphylaxis    Has patient had a PCN reaction causing immediate rash, facial/tongue/throat swelling, SOB or lightheadedness with hypotension: Yes Has patient had a PCN reaction causing severe rash involving mucus membranes or skin necrosis: No Has patient had a PCN reaction that required hospitalization: No Has patient had a PCN reaction occurring within the last 10 years: No If all of the above answers are "NO", then may proceed with Cephalosporin use.   . Codeine Nausea And Vomiting    Percocet is ok to take  . Latex Itching  . Sulfur Other (See Comments)     Patient stated redness, discomfort, and made skin bleed  . Tape Other (See Comments)    Burns and blisters, paper tape is ok if changed every 24 hours  . Xopenex [Levalbuterol Hcl] Other (See Comments)    Makes her feel very anxious     No prescriptions prior to admission.    No results found for this or any previous visit (from the past 48 hour(s)). No results found.  Review of Systems  Constitutional: Negative.   HENT: Negative.   Eyes: Negative.   Respiratory: Negative.   Cardiovascular: Negative.   Gastrointestinal: Negative.   Genitourinary: Negative.  Musculoskeletal: Negative.   Skin: Negative.   Neurological: Negative.   Psychiatric/Behavioral: Negative.     There were no vitals taken for this visit. Physical Exam  Constitutional: She is oriented to person, place, and time. She appears well-developed.  HENT:  Head: Normocephalic and atraumatic.  Eyes: Pupils are equal, round, and reactive to light. Conjunctivae and EOM are normal.  Cardiovascular: Normal rate.   Respiratory: Effort normal. No respiratory distress.  GI: Soft. She exhibits no distension. There is no tenderness.  Musculoskeletal: She exhibits tenderness.       Legs: Neurological: She is alert and oriented to person, place, and time.  Skin: Skin is warm. There is erythema.  Psychiatric: She has a  normal mood and affect. Her behavior is normal.     Assessment/Plan Plan for excision of right thigh skin cancer and send for path.   [name], DO 03/29/2017, 6:21 AM

## 2017-03-29 NOTE — Anesthesia Preprocedure Evaluation (Signed)
Anesthesia Evaluation  Patient identified by MRN, date of birth, ID band Patient awake    Reviewed: Allergy & Precautions, NPO status , Patient's Chart, lab work & pertinent test results  Airway Mallampati: I       Dental  (+) Teeth Intact, Poor Dentition   Pulmonary asthma , former smoker,    Pulmonary exam normal        Cardiovascular  Rhythm:Regular Rate:Normal     Neuro/Psych negative neurological ROS     GI/Hepatic negative GI ROS, Neg liver ROS,   Endo/Other  negative endocrine ROS  Renal/GU negative Renal ROS  negative genitourinary   Musculoskeletal negative musculoskeletal ROS (+)   Abdominal Normal abdominal exam  (+)   Peds  Hematology   Anesthesia Other Findings   Reproductive/Obstetrics                             Anesthesia Physical Anesthesia Plan  ASA: II  Anesthesia Plan: General   Post-op Pain Management:    Induction: Intravenous  PONV Risk Score and Plan: 3 and Ondansetron, Dexamethasone, Propofol and Midazolam  Airway Management Planned:   Additional Equipment:   Intra-op Plan:   Post-operative Plan:   Informed Consent: I have reviewed the patients History and Physical, chart, labs and discussed the procedure including the risks, benefits and alternatives for the proposed anesthesia with the patient or authorized representative who has indicated his/her understanding and acceptance.   Dental advisory given  Plan Discussed with: CRNA and Surgeon  Anesthesia Plan Comments:         Anesthesia Quick Evaluation

## 2017-03-29 NOTE — Discharge Instructions (Signed)
KY gel to the wound of the right thigh daily Do not get wet

## 2017-03-29 NOTE — H&P (Signed)
Patricia Galloway is an 66 y.o. female.   Chief Complaint: right leg pain and bleeding HPI: The patient is a 66 yrs old wf here for bleeding.  She underwent a basal cell resection from her right thigh and had some bleeding and severe pain.  She is stable.  There are social issues as well with limited to no transportation.   Got the bleeding under control in the office.   Past Medical History:  Diagnosis Date  . Anemia    has had iron infusion  . Anxiety    complicated grief  . Aortic aneurysm ([place])    found on CT in December, 2017  . Asthma    as child , but no episodes in over 30 years  . Cancer ([place]) 08/2016   basal cell carcinoma - right leg  . History of transfusion of packed red blood cells     Past Surgical History:  Procedure Laterality Date  . COLON SURGERY  2002   colostomy for 10 months after a perfurated sigmoid   . COLOSTOMY REVERSAL  2003  . EMBOLIZATION Right 10/12/2016   Procedure: Embolization;  Surgeon: [name], MD;  Location: [facility];  Service: Cardiovascular;  Laterality: Right;  . etopic  2482,5003  . TONSILLECTOMY      Family History  Problem Relation Age of Onset  . Dementia Mother   . Aortic stenosis Mother   . Osteoporosis Mother   . Dementia Father   . Diabetes Father   . Hyperlipidemia Father   . Hypertension Father   . Aneurysm Sister   . Hyperlipidemia Sister   . Hypertension Sister   . Diabetes Sister    Social History:  reports that she quit smoking about 18 years ago. Her smoking use included Cigarettes. She has a 12.50 pack-year smoking history. She has never used smokeless tobacco. She reports that she does not drink alcohol or use drugs.  Allergies:  Allergies  Allergen Reactions  . Penicillins Anaphylaxis    Has patient had a PCN reaction causing immediate rash, facial/tongue/throat swelling, SOB or lightheadedness with hypotension: Yes Has patient had a PCN reaction causing severe rash involving mucus membranes  or skin necrosis: No Has patient had a PCN reaction that required hospitalization: No Has patient had a PCN reaction occurring within the last 10 years: No If all of the above answers are "NO", then may proceed with Cephalosporin use.   . Codeine Nausea And Vomiting    Percocet is ok to take  . Latex Itching  . Sulfur Other (See Comments)     Patient stated redness, discomfort, and made skin bleed  . Tape Other (See Comments)    Burns and blisters, paper tape is ok if changed every 24 hours  . Xopenex [Levalbuterol Hcl] Other (See Comments)    Makes her feel very anxious     No prescriptions prior to admission.    Results for orders placed or performed during the hospital encounter of 03/29/17 (from the past 48 hour(s))  CBC WITH DIFFERENTIAL     Status: None   Collection Time: 03/29/17  8:29 AM  Result Value Ref Range   WBC 9.9 4.0 - 10.5 K/uL   RBC 4.55 3.87 - 5.11 MIL/uL   Hemoglobin 14.4 12.0 - 15.0 g/dL   HCT 40.4 36.0 - 46.0 %   MCV 88.8 78.0 - 100.0 fL   MCH 31.6 26.0 - 34.0 pg   MCHC 35.6 30.0 - 36.0 g/dL  RDW 13.5 11.5 - 15.5 %   Platelets 266 150 - 400 K/uL   Neutrophils Relative % 66 %   Neutro Abs 6.6 1.7 - 7.7 K/uL   Lymphocytes Relative 19 %   Lymphs Abs 1.9 0.7 - 4.0 K/uL   Monocytes Relative 10 %   Monocytes Absolute 0.9 0.1 - 1.0 K/uL   Eosinophils Relative 4 %   Eosinophils Absolute 0.4 0.0 - 0.7 K/uL   Basophils Relative 1 %   Basophils Absolute 0.1 0.0 - 0.1 K/uL  Comprehensive metabolic panel     Status: Abnormal   Collection Time: 03/29/17  8:29 AM  Result Value Ref Range   Sodium 138 135 - 145 mmol/L   Potassium 3.9 3.5 - 5.1 mmol/L   Chloride 102 101 - 111 mmol/L   CO2 24 22 - 32 mmol/L   Glucose, Bld 106 (H) 65 - 99 mg/dL   BUN 10 6 - 20 mg/dL   Creatinine, Ser 0.71 0.44 - 1.00 mg/dL   Calcium 9.5 8.9 - 10.3 mg/dL   Total Protein 7.3 6.5 - 8.1 g/dL   Albumin 4.5 3.5 - 5.0 g/dL   AST 33 15 - 41 U/L   ALT 20 14 - 54 U/L   Alkaline  Phosphatase 58 38 - 126 U/L   Total Bilirubin 0.6 0.3 - 1.2 mg/dL   GFR calc non Af Amer >60 >60 mL/min   GFR calc Af Amer >60 >60 mL/min    Comment: (NOTE) The eGFR has been calculated using the CKD EPI equation. This calculation has not been validated in all clinical situations. eGFR's persistently <60 mL/min signify possible Chronic Kidney Disease.    Anion gap 12 5 - 15   No results found.  Review of Systems  Constitutional: Negative.   HENT: Negative.   Eyes: Negative.   Respiratory: Negative.   Cardiovascular: Negative.   Gastrointestinal: Negative.   Genitourinary: Negative.   Musculoskeletal: Negative.   Skin: Negative.   Neurological: Negative.   Psychiatric/Behavioral: Negative.     There were no vitals taken for this visit. Physical Exam  Constitutional: She is oriented to person, place, and time. She appears well-developed and well-nourished.  HENT:  Head: Normocephalic and atraumatic.  Eyes: Pupils are equal, round, and reactive to light. EOM are normal.  Cardiovascular: Normal rate.   Respiratory: Effort normal.  Musculoskeletal:       Legs: Neurological: She is alert and oriented to person, place, and time.  Psychiatric: She has a normal mood and affect. Her behavior is normal.     Assessment/Plan Admit and control pain tonight.  No dressing change needed tonight.  [name], DO 03/29/2017, 4:13 PM

## 2017-03-29 NOTE — Anesthesia Procedure Notes (Signed)
Procedure Name: LMA Insertion Date/Time: 03/29/2017 10:47 AM Performed by: Scheryl Darter Pre-anesthesia Checklist: Patient identified, Emergency Drugs available, Suction available and Patient being monitored Patient Re-evaluated:Patient Re-evaluated prior to induction Oxygen Delivery Method: Circle System Utilized Preoxygenation: Pre-oxygenation with 100% oxygen Induction Type: IV induction Ventilation: Mask ventilation without difficulty LMA: LMA inserted LMA Size: 4.0 Number of attempts: 1 Placement Confirmation: positive ETCO2 Tube secured with: Tape Dental Injury: Teeth and Oropharynx as per pre-operative assessment

## 2017-03-29 NOTE — Transfer of Care (Signed)
Immediate Anesthesia Transfer of Care Note  Patient: Patricia Galloway  Procedure(s) Performed: Procedure(s): EXCISION OF RIGHT LEG BASEL CELL CARCINOMA (Right)  Patient Location: PACU  Anesthesia Type:General  Level of Consciousness: awake, alert , oriented and sedated  Airway & Oxygen Therapy: Patient Spontanous Breathing and Patient connected to nasal cannula oxygen  Post-op Assessment: Report given to RN, Post -op Vital signs reviewed and stable and Patient moving all extremities  Post vital signs: Reviewed and stable  Last Vitals:  Vitals:   03/29/17 0811 03/29/17 1152  BP: (!) 197/90 132/79  Pulse: 88 (!) 112  Resp: 18 (!) 24  Temp: 36.7 C 36.6 C    Last Pain:  Vitals:   03/29/17 0845  TempSrc:   PainSc: 0-No pain      Patients Stated Pain Goal: 0 (17/49/44 9675)  Complications: No apparent anesthesia complications

## 2017-03-29 NOTE — Interval H&P Note (Signed)
History and Physical Interval Note:  03/29/2017 10:04 AM  Patricia Galloway  has presented today for surgery, with the diagnosis of BASAL CELL CARCINOMA OF THE SKIN OF RIGHT LOWER EXTREMITY INCLUDING HIP  The various methods of treatment have been discussed with the patient and family. After consideration of risks, benefits and other options for treatment, the patient has consented to  Procedure(s): A CELL PLACEMENT (Right) EXCISION OF RIGHT LEG BASEL CELL CARCINOMA WITH A CELL PLACEMENT (Right) as a surgical intervention .  The patient's history has been reviewed, patient examined, no change in status, stable for surgery.  I have reviewed the patient's chart and labs.  Questions were answered to the patient's satisfaction.     [name]

## 2017-03-30 ENCOUNTER — Observation Stay (HOSPITAL_COMMUNITY): Payer: Medicare Other | Admitting: Certified Registered Nurse Anesthetist

## 2017-03-30 ENCOUNTER — Encounter (HOSPITAL_COMMUNITY): Payer: Self-pay | Admitting: Plastic Surgery

## 2017-03-30 ENCOUNTER — Encounter (HOSPITAL_COMMUNITY)
Admission: AD | Disposition: A | Discharge status: Home Health | Payer: Self-pay | Source: Ambulatory Visit | Attending: Plastic Surgery

## 2017-03-30 ENCOUNTER — Ambulatory Visit: Payer: Self-pay | Admitting: Plastic Surgery

## 2017-03-30 DIAGNOSIS — C44712 Basal cell carcinoma of skin of right lower limb, including hip: Secondary | ICD-10-CM

## 2017-03-30 DIAGNOSIS — M79661 Pain in right lower leg: Secondary | ICD-10-CM

## 2017-03-30 HISTORY — PX: INCISION AND DRAINAGE OF WOUND: SHX1803

## 2017-03-30 LAB — COMPREHENSIVE METABOLIC PANEL
ALBUMIN: 3.4 g/dL — AB (ref 3.5–5.0)
ALK PHOS: 42 U/L (ref 38–126)
ALT: 21 U/L (ref 14–54)
AST: 54 U/L — AB (ref 15–41)
Anion gap: 5 (ref 5–15)
BILIRUBIN TOTAL: 0.2 mg/dL — AB (ref 0.3–1.2)
BUN: 9 mg/dL (ref 6–20)
CALCIUM: 8.2 mg/dL — AB (ref 8.9–10.3)
CO2: 27 mmol/L (ref 22–32)
Chloride: 102 mmol/L (ref 101–111)
Creatinine, Ser: 0.67 mg/dL (ref 0.44–1.00)
GFR calc Af Amer: >60 mL/min (ref >60)
GFR calc non Af Amer: >60 mL/min (ref >60)
GLUCOSE: 115 mg/dL — AB (ref 65–99)
Potassium: 3.9 mmol/L (ref 3.5–5.1)
SODIUM: 134 mmol/L — AB (ref 135–145)
TOTAL PROTEIN: 5.9 g/dL — AB (ref 6.5–8.1)

## 2017-03-30 LAB — POCT I-STAT EG7
BICARBONATE: 25.6 mmol/L (ref 20.0–28.0)
Calcium, Ion: 1.18 mmol/L (ref 1.15–1.40)
HCT: 32 % — ABNORMAL LOW (ref 36.0–46.0)
Hemoglobin: 10.9 g/dL — ABNORMAL LOW (ref 12.0–15.0)
O2 Saturation: 78 %
PCO2 VEN: 45.6 mmHg (ref 44.0–60.0)
PH VEN: 7.356 (ref 7.250–7.430)
PO2 VEN: 44 mmHg (ref 32.0–45.0)
POTASSIUM: 4.6 mmol/L (ref 3.5–5.1)
Sodium: 136 mmol/L (ref 135–145)
TCO2: 27 mmol/L (ref 0–100)

## 2017-03-30 LAB — CBC WITH DIFFERENTIAL/PLATELET
BASOS ABS: 0 10*3/uL (ref 0.0–0.1)
BASOS PCT: 0 %
EOS ABS: 0.2 10*3/uL (ref 0.0–0.7)
Eosinophils Relative: 2 %
HEMATOCRIT: 29.1 % — AB (ref 36.0–46.0)
HEMOGLOBIN: 10.2 g/dL — AB (ref 12.0–15.0)
Lymphocytes Relative: 12 %
Lymphs Abs: 1.6 10*3/uL (ref 0.7–4.0)
MCH: 30.8 pg (ref 26.0–34.0)
MCHC: 35.1 g/dL (ref 30.0–36.0)
MCV: 87.9 fL (ref 78.0–100.0)
Monocytes Absolute: 0.8 10*3/uL (ref 0.1–1.0)
Monocytes Relative: 6 %
NEUTROS ABS: 9.9 10*3/uL — AB (ref 1.7–7.7)
NEUTROS PCT: 80 %
Platelets: 221 10*3/uL (ref 150–400)
RBC: 3.31 MIL/uL — ABNORMAL LOW (ref 3.87–5.11)
RDW: 13.2 % (ref 11.5–15.5)
WBC: 12.5 10*3/uL — AB (ref 4.0–10.5)

## 2017-03-30 LAB — ABO/RH: ABO/RH(D): A POS

## 2017-03-30 LAB — HIV ANTIBODY (ROUTINE TESTING W REFLEX): HIV Screen 4th Generation wRfx: NONREACTIVE

## 2017-03-30 LAB — TYPE AND SCREEN
ABO/RH(D): A POS
Antibody Screen: NEGATIVE

## 2017-03-30 SURGERY — IRRIGATION AND DEBRIDEMENT WOUND
Anesthesia: General | Laterality: Right

## 2017-03-30 MED ORDER — ALBUTEROL SULFATE (2.5 MG/3ML) 0.083% IN NEBU: 3.0000 mL | INHALATION_SOLUTION | Freq: Four times a day (QID) | RESPIRATORY_TRACT | Status: DC | PRN

## 2017-03-30 MED ORDER — ROCURONIUM BROMIDE 10 MG/ML (PF) SYRINGE
PREFILLED_SYRINGE | INTRAVENOUS | Status: DC | PRN
  Administered 2017-03-30: 140 mg via INTRAVENOUS

## 2017-03-30 MED ORDER — FENTANYL CITRATE (PF) 100 MCG/2ML IJ SOLN
INTRAMUSCULAR | Status: DC | PRN
  Administered 2017-03-30 (×2): 50 ug via INTRAVENOUS

## 2017-03-30 MED ORDER — OXYCODONE-ACETAMINOPHEN 5-325 MG PO TABS
1.0000 | ORAL_TABLET | Freq: Four times a day (QID) | ORAL | Status: DC | PRN
  Administered 2017-03-31: 1 via ORAL
  Filled 2017-03-30: qty 1

## 2017-03-30 MED ORDER — ONDANSETRON HCL 4 MG PO TABS: 4.0000 mg | ORAL_TABLET | Freq: Three times a day (TID) | ORAL | Status: DC | PRN

## 2017-03-30 MED ORDER — 0.9 % SODIUM CHLORIDE (POUR BTL) OPTIME
TOPICAL | Status: DC | PRN
  Administered 2017-03-30: 1000 mL

## 2017-03-30 MED ORDER — FLUTICASONE FUROATE-VILANTEROL 100-25 MCG/INH IN AEPB
1.0000 | INHALATION_SPRAY | Freq: Every day | RESPIRATORY_TRACT | Status: DC
  Administered 2017-03-30 – 2017-04-01 (×3): 1 via RESPIRATORY_TRACT
  Filled 2017-03-30: qty 28

## 2017-03-30 MED ORDER — MORPHINE SULFATE (PF) 2 MG/ML IV SOLN: 2.0000 mg | INTRAVENOUS | Status: DC | PRN

## 2017-03-30 MED ORDER — CIPROFLOXACIN IN D5W 400 MG/200ML IV SOLN
INTRAVENOUS | Status: AC
  Filled 2017-03-30: qty 200

## 2017-03-30 MED ORDER — CIPROFLOXACIN IN D5W 400 MG/200ML IV SOLN
INTRAVENOUS | Status: DC | PRN
  Administered 2017-03-30: 400 mg via INTRAVENOUS

## 2017-03-30 MED ORDER — FENTANYL CITRATE (PF) 100 MCG/2ML IJ SOLN: 25.0000 ug | INTRAMUSCULAR | Status: DC | PRN

## 2017-03-30 MED ORDER — LIDOCAINE HCL 2 % IJ SOLN
INTRAMUSCULAR | Status: AC
  Filled 2017-03-30: qty 20

## 2017-03-30 MED ORDER — ALBUMIN HUMAN 5 % IV SOLN
INTRAVENOUS | Status: AC
  Filled 2017-03-30: qty 500

## 2017-03-30 MED ORDER — FENTANYL CITRATE (PF) 100 MCG/2ML IJ SOLN
INTRAMUSCULAR | Status: AC
  Filled 2017-03-30: qty 2

## 2017-03-30 MED ORDER — SODIUM CHLORIDE 0.9 % IV SOLN
INTRAVENOUS | Status: DC | PRN
  Administered 2017-03-30: 500 mL

## 2017-03-30 MED ORDER — GLYCOPYRROLATE 0.2 MG/ML IV SOSY
PREFILLED_SYRINGE | INTRAVENOUS | Status: DC | PRN
  Administered 2017-03-30: .3 mg via INTRAVENOUS

## 2017-03-30 MED ORDER — FENTANYL CITRATE (PF) 100 MCG/2ML IJ SOLN
25.0000 ug | INTRAMUSCULAR | Status: DC | PRN
  Administered 2017-03-30: 25 ug via INTRAVENOUS
  Administered 2017-03-30: 50 ug via INTRAVENOUS
  Administered 2017-03-30: 25 ug via INTRAVENOUS

## 2017-03-30 MED ORDER — SPIRULINA 500 MG PO TABS: 500.0000 mg | ORAL_TABLET | Freq: Every day | ORAL | Status: DC

## 2017-03-30 MED ORDER — LACTATED RINGERS IV SOLN
INTRAVENOUS | Status: DC | PRN
  Administered 2017-03-30: 17:00:00 via INTRAVENOUS

## 2017-03-30 MED ORDER — LIDOCAINE 2% (20 MG/ML) 5 ML SYRINGE
INTRAMUSCULAR | Status: DC | PRN
  Administered 2017-03-30: 50 mg via INTRAVENOUS

## 2017-03-30 MED ORDER — FENTANYL CITRATE (PF) 100 MCG/2ML IJ SOLN
INTRAMUSCULAR | Status: AC
  Administered 2017-03-30: 25 ug via INTRAVENOUS
  Filled 2017-03-30: qty 2

## 2017-03-30 MED ORDER — MIDAZOLAM HCL 2 MG/2ML IJ SOLN
INTRAMUSCULAR | Status: AC
  Filled 2017-03-30: qty 2

## 2017-03-30 MED ORDER — SODIUM CHLORIDE 0.9 % IV SOLN: Freq: Once | INTRAVENOUS | Status: DC

## 2017-03-30 MED ORDER — SUCCINYLCHOLINE CHLORIDE 20 MG/ML IJ SOLN
INTRAMUSCULAR | Status: DC | PRN
  Administered 2017-03-30: 80 mg via INTRAVENOUS

## 2017-03-30 MED ORDER — ADULT MULTIVITAMIN W/MINERALS CH
2.0000 | ORAL_TABLET | Freq: Every day | ORAL | Status: DC
  Administered 2017-03-31 – 2017-04-01 (×2): 2 via ORAL
  Filled 2017-03-30 (×2): qty 2

## 2017-03-30 MED ORDER — EPHEDRINE SULFATE-NACL 50-0.9 MG/10ML-% IV SOSY
PREFILLED_SYRINGE | INTRAVENOUS | Status: DC | PRN
  Administered 2017-03-30: 20 mg via INTRAVENOUS

## 2017-03-30 MED ORDER — DIAZEPAM 5 MG PO TABS: 5.0000 mg | ORAL_TABLET | Freq: Two times a day (BID) | ORAL | Status: DC | PRN

## 2017-03-30 MED ORDER — FLUTICASONE FUROATE-VILANTEROL 100-25 MCG/INH IN AEPB
1.0000 | INHALATION_SPRAY | Freq: Every day | RESPIRATORY_TRACT | Status: DC
  Filled 2017-03-30: qty 28

## 2017-03-30 MED ORDER — POLYMYXIN B SULFATE 500000 UNITS IJ SOLR
INTRAMUSCULAR | Status: AC
  Filled 2017-03-30: qty 500000

## 2017-03-30 MED ORDER — PHENYLEPHRINE 40 MCG/ML (10ML) SYRINGE FOR IV PUSH (FOR BLOOD PRESSURE SUPPORT)
PREFILLED_SYRINGE | INTRAVENOUS | Status: DC | PRN
  Administered 2017-03-30: 80 ug via INTRAVENOUS

## 2017-03-30 SURGICAL SUPPLY — 44 items
BANDAGE ACE 4X5 VEL STRL LF (GAUZE/BANDAGES/DRESSINGS) ×2 IMPLANT
BNDG GAUZE ELAST 4 BULKY (GAUZE/BANDAGES/DRESSINGS) IMPLANT
CLIP VESOCCLUDE MED 6/CT (CLIP) ×4 IMPLANT
CONT SPEC 4OZ CLIKSEAL STRL BL (MISCELLANEOUS) ×2 IMPLANT
COVER SURGICAL LIGHT HANDLE (MISCELLANEOUS) ×2 IMPLANT
DECANTER SPIKE VIAL GLASS SM (MISCELLANEOUS) IMPLANT
DRAPE INCISE IOBAN 66X45 STRL (DRAPES) ×2 IMPLANT
DRAPE LAPAROTOMY T 102X78X121 (DRAPES) ×2 IMPLANT
DRAPE ORTHO SPLIT 77X108 STRL (DRAPES) ×1
DRAPE SURG ORHT 6 SPLT 77X108 (DRAPES) ×1 IMPLANT
DRAPE UTILITY XL STRL (DRAPES) ×2 IMPLANT
DRESSING DUODERM 4X4 STERILE (GAUZE/BANDAGES/DRESSINGS) IMPLANT
DRSG ADAPTIC 3X8 NADH LF (GAUZE/BANDAGES/DRESSINGS) ×2 IMPLANT
DRSG EMULSION OIL 3X16 NADH (GAUZE/BANDAGES/DRESSINGS) ×2 IMPLANT
DRSG PAD ABDOMINAL 8X10 ST (GAUZE/BANDAGES/DRESSINGS) IMPLANT
DRSG VAC ATS MED SENSATRAC (GAUZE/BANDAGES/DRESSINGS) ×2 IMPLANT
ELECT REM PT RETURN 15FT ADLT (MISCELLANEOUS) ×2 IMPLANT
ELECT REM PT RETURN 9FT ADLT (ELECTROSURGICAL) ×2
ELECTRODE REM PT RTRN 9FT ADLT (ELECTROSURGICAL) ×1 IMPLANT
GAUZE SPONGE 4X4 12PLY STRL (GAUZE/BANDAGES/DRESSINGS) ×2 IMPLANT
GLOVE BIO SURGEON STRL SZ 6.5 (GLOVE) ×2 IMPLANT
GLOVE SURG SS PI 6.5 STRL IVOR (GLOVE) ×4 IMPLANT
GOWN STRL REUS W/TWL LRG LVL3 (GOWN DISPOSABLE) ×4 IMPLANT
HANDPIECE INTERPULSE COAX TIP (DISPOSABLE)
KIT BASIN OR (CUSTOM PROCEDURE TRAY) ×2 IMPLANT
NEEDLE HYPO 22GX1.5 SAFETY (NEEDLE) IMPLANT
PACK GENERAL/GYN (CUSTOM PROCEDURE TRAY) ×2 IMPLANT
PAD CAST 4YDX4 CTTN HI CHSV (CAST SUPPLIES) ×1 IMPLANT
PADDING CAST COTTON 4X4 STRL (CAST SUPPLIES) ×1
SET HNDPC FAN SPRY TIP SCT (DISPOSABLE) IMPLANT
SOL PREP PROV IODINE SCRUB 4OZ (MISCELLANEOUS) ×2 IMPLANT
SPONGE LAP 18X18 X RAY DECT (DISPOSABLE) IMPLANT
STAPLER VISISTAT 35W (STAPLE) ×2 IMPLANT
SUT MNCRL AB 4-0 PS2 18 (SUTURE) ×2 IMPLANT
SUT PROLENE 6 0 P 1 18 (SUTURE) ×4 IMPLANT
SUT SILK 4 0 PS 2 (SUTURE) IMPLANT
SUT VIC AB 3-0 PS2 18 (SUTURE) ×2
SUT VIC AB 3-0 PS2 18XBRD (SUTURE) ×2 IMPLANT
SUT VIC AB 5-0 PS2 18 (SUTURE) IMPLANT
SWAB COLLECTION DEVICE MRSA (MISCELLANEOUS) IMPLANT
SWAB CULTURE ESWAB REG 1ML (MISCELLANEOUS) ×2 IMPLANT
SYR CONTROL 10ML LL (SYRINGE) IMPLANT
TOWEL OR 17X26 10 PK STRL BLUE (TOWEL DISPOSABLE) ×2 IMPLANT
YANKAUER SUCT BULB TIP 10FT TU (MISCELLANEOUS) ×2 IMPLANT

## 2017-03-30 NOTE — Consult Note (Signed)
[facility] Medical Consultation  Caniya Tagle HDQ:[id number] DOB: August 01, 1951 DOA: 03/29/2017 PCP: [name], MD   Requesting physician: Dr.Dillingham Date of consultation: 03/30/17 Reason for consultation: management of COPD and anemia   Impression/Recommendations Active Problems:   Pain in limb   Basal cell carcinoma    1. COPD: stable, not in exacerbation. Continue Brio and albuterol as needed.  2. Bleeding: due to recent surgery s/p surgical repair (clips). Will check CBC/diff.  3. Pain : continue morphine prn.   I will followup again tomorrow. Please contact me if I can be of assistance in the meanwhile. Thank you for this consultation.  Chief Complaint: basal cell carcinoma excision complicated by bleeding vessel   HPI:  Pt is a 66 yo female with hx of COPD and BCC s/p surgical resection from anterior thigh complicated by bleeding vessel s/p surgical repair in OR today with applied VAC in place. Medicine was consulted to check for bleeding and COPD management . When I saw the patient she was comfortable sitting in bed in no distress, had no complaints except pain at surgery site.   Review of Systems:  Cough : only a couple of times after extubation  No wheezing/dyspnea/chest pain No N/V/D/C/abd pain Both feet cold  Past Medical History:  Diagnosis Date  . Anemia    has had iron infusion  . Anxiety    complicated grief  . Aortic aneurysm ([place])    found on CT in December, 2017  . Asthma    as child , but no episodes in over 30 years  . Cancer ([place]) 08/2016   basal cell carcinoma - right leg  . History of transfusion of packed red blood cells    Past Surgical History:  Procedure Laterality Date  . BASAL CELL CARCINOMA EXCISION Right 03/29/2017   Procedure: EXCISION OF RIGHT LEG BASEL CELL CARCINOMA;  Surgeon: [name], DO;  Location: [facility];  Service: Plastics;  Laterality: Right;  . COLON SURGERY  2002   colostomy for 10 months after a  perfurated sigmoid   . COLOSTOMY REVERSAL  2003  . EMBOLIZATION Right 10/12/2016   Procedure: Embolization;  Surgeon: [name], MD;  Location: [facility];  Service: Cardiovascular;  Laterality: Right;  . etopic  1194,1740  . TONSILLECTOMY     Social History:  reports that she quit smoking about 18 years ago. Her smoking use included Cigarettes. She has a 12.50 pack-year smoking history. She has never used smokeless tobacco. She reports that she does not drink alcohol or use drugs.  Allergies  Allergen Reactions  . Penicillins Anaphylaxis    Has patient had a PCN reaction causing immediate rash, facial/tongue/throat swelling, SOB or lightheadedness with hypotension: Yes Has patient had a PCN reaction causing severe rash involving mucus membranes or skin necrosis: No Has patient had a PCN reaction that required hospitalization: No Has patient had a PCN reaction occurring within the last 10 years: No If all of the above answers are "NO", then may proceed with Cephalosporin use.   . Codeine Nausea And Vomiting  . Latex Itching  . Sulfur Other (See Comments)    Reaction:  Redness/made skin bleed   . Tape Other (See Comments)    Reaction:  Blisters and burning  Pt states that paper tape is okay.    [address] Hcl] Anxiety   Family History  Problem Relation Age of Onset  . Dementia Mother   . Aortic stenosis Mother   .  Osteoporosis Mother   . Dementia Father   . Diabetes Father   . Hyperlipidemia Father   . Hypertension Father   . Aneurysm Sister   . Hyperlipidemia Sister   . Hypertension Sister   . Diabetes Sister     Prior to Admission medications   Medication Sig Start Date End Date Taking? Authorizing Provider  acidophilus (RISAQUAD) CAPS capsule Take 3 capsules by mouth at bedtime.   Yes [provider]  albuterol (PROVENTIL HFA;VENTOLIN HFA) 108 (90 Base) MCG/ACT inhaler Inhale 2 puffs into the lungs every 6 (six) hours as needed for  wheezing or shortness of breath. 11/10/15  Yes Sowles, Drue Stager, MD  cholecalciferol (VITAMIN D) 1000 units tablet Take 1,000 Units by mouth 2 (two) times daily.   Yes [provider]  Coenzyme Q10 (COQ10) 100 MG CAPS Take 100 mg by mouth 2 (two) times daily.   Yes [provider]  diazepam (VALIUM) 5 MG tablet Take 1 tablet (5 mg total) by mouth every 12 (twelve) hours as needed for anxiety. 02/09/17  Yes Hubbard Hartshorn, FNP  Digestive Enzyme CAPS Take 1 capsule by mouth 3 (three) times daily after meals.    Yes [provider]  fluticasone furoate-vilanterol (BREO ELLIPTA) 100-25 MCG/INH AEPB Inhale 1 puff into the lungs daily. 09/13/16  Yes Sowles, Drue Stager, MD  Ginkgo Biloba Extract (GNP GINGKO BILOBA EXTRACT) 60 MG CAPS Take 60 mg by mouth daily.    Yes [provider]  magnesium 30 MG tablet Take 60 mg by mouth daily.    Yes [provider]  Multiple Vitamin (MULTIVITAMIN) capsule Take 2 capsules by mouth daily.    Yes [provider]  ondansetron (ZOFRAN) 4 MG tablet Take 1 tablet (4 mg total) by mouth every 8 (eight) hours as needed for nausea or vomiting. 12/26/16  Yes Sowles, Drue Stager, MD  OVER THE COUNTER MEDICATION Take 1 tablet by mouth 2 (two) times daily. Mushroom Formula - Stamets 7   Yes [provider]  OVER THE COUNTER MEDICATION Take 1 tablet by mouth 2 (two) times daily. Nutri-Calm   Yes [provider]  Spirulina 500 MG TABS Take 500 mg by mouth daily.    Yes [provider]  ciprofloxacin (CIPRO) 500 MG tablet Take 1 tablet (500 mg total) by mouth 2 (two) times daily. 03/29/17   Rayburn, Neta Mends, PA-C  oxyCODONE-acetaminophen (ROXICET) 5-325 MG tablet Take 1 tablet by mouth every 6 (six) hours as needed for moderate pain or severe pain. 03/29/17   Rayburn, Neta Mends, PA-C   Physical Exam: Blood pressure (!) 146/70, pulse 85, temperature (!) 97.3 F (36.3 C), resp. rate 14, height 5\' 3"  (1.6  m), weight 44.9 kg (99 lb), SpO2 100 %. Vitals:   03/30/17 1845 03/30/17 1858  BP: 128/71 (!) 146/70  Pulse: 91 85  Resp: 12 14  Temp: 97.6 F (36.4 C) (!) 97.3 F (36.3 C)     General:  In NAD      Neck: supple   Cardiovascular: RRR, no gallop/murmur  Respiratory: CTAB  Abdomen: ND, NT  Skin: VAC at anterior thigh hooked to suction w/o blood or discharge. No bleeding at site is seen    Labs on Admission:  Basic Metabolic Panel:  Recent Labs Lab 03/29/17 0829 03/30/17 1724  NA 138 136  K 3.9 4.6  CL 102  --   CO2 24  --   GLUCOSE 106*  --   BUN 10  --  CREATININE 0.71  --   CALCIUM 9.5  --    Liver Function Tests:  Recent Labs Lab 03/29/17 0829  AST 33  ALT 20  ALKPHOS 58  BILITOT 0.6  PROT 7.3  ALBUMIN 4.5   No results for input(s): LIPASE, AMYLASE in the last 168 hours. No results for input(s): AMMONIA in the last 168 hours. CBC:  Recent Labs Lab 03/29/17 0829 03/29/17 1720 03/30/17 1724  WBC 9.9 13.5*  --   NEUTROABS 6.6  --   --   HGB 14.4 12.5 10.9*  HCT 40.4 35.6* 32.0*  MCV 88.8 89.4  --   PLT 266 235  --    Cardiac Enzymes: No results for input(s): CKTOTAL, CKMB, CKMBINDEX, TROPONINI in the last 168 hours. BNP: Invalid input(s): POCBNP CBG: No results for input(s): GLUCAP in the last 168 hours.  Radiological Exams on Admission: No results found.    Time spent: 30 minutes   [facility]   If 7PM-7AM, please contact night-coverage [email or web address] Password [facility] 03/30/2017, 8:45 PM

## 2017-03-30 NOTE — Progress Notes (Signed)
Dressing has saturated through to bed pad. Dr. Marla Roe notified and orders noted. Donne Hazel, RN

## 2017-03-30 NOTE — Interval H&P Note (Signed)
History and Physical Interval Note:  03/30/2017 4:52 PM  Patricia Galloway  has presented today for surgery, with the diagnosis of right arterial leg bleed  The various methods of treatment have been discussed with the patient and family. After consideration of risks, benefits and other options for treatment, the patient has consented to  Procedure(s): IRRIGATION AND DEBRIDEMENT WOUND (Right) as a surgical intervention .  The patient's history has been reviewed, patient examined, no change in status, stable for surgery.  I have reviewed the patient's chart and labs.  Questions were answered to the patient's satisfaction.  Bleeding and requires hemostasis.   [name]

## 2017-03-30 NOTE — Progress Notes (Signed)
As Probation officer as applying KY jelly to wound, it began bleeding profusely. Sterile ABD dressing laid over wound and Dr Marla Roe called back. Instructions regarding supplies given and staff in process of trying to gather same. Donne Hazel, RN

## 2017-03-30 NOTE — Evaluation (Signed)
Physical Therapy Evaluation Patient Details Name: Patricia Galloway MRN: [id number] DOB: [birth year] Today's Date: 03/30/2017   History of Present Illness  66 yo female admitted with R LE pain 2* thigh carcinoma. S/P resection 7/25.   Clinical Impression  On eval, pt was supervision level assist for mobility. She walked ~55 feet with a RW. Ambulation distance was limited by pain. Pain rated 7/10 at rest prior to mobility. Encouraged pt to speak with RN if she needed more pain meds. Encouraged pt to walk, with nursing supervision, as tolerated. Will follow. Recommend RW for safety and pain control when ambulating.    Follow Up Recommendations No PT follow up    Equipment Recommendations  Rolling walker with 5" wheels    Recommendations for Other Services       Precautions / Restrictions Precautions Precautions: Fall Restrictions Weight Bearing Restrictions: No      Mobility  Bed Mobility Overal bed mobility: Modified Independent                Transfers Overall transfer level: Modified independent                  Ambulation/Gait Ambulation/Gait assistance: Supervision Ambulation Distance (Feet): 55 Feet Assistive device: Rolling walker (2 wheeled) Gait Pattern/deviations: Step-to pattern     General Gait Details: Pt using PWB gait pattern currently due to pain. She did well with sequencing and with safe use of RW. Distance limited by pain.   Stairs            Wheelchair Mobility    Modified Rankin (Stroke Patients Only)       Balance                                             Pertinent Vitals/Pain Pain Assessment: 0-10 Pain Score: 7  Pain Location: R LE Pain Intervention(s): Monitored during session;Limited activity within patient's tolerance    Home Living Family/patient expects to be discharged to:: Private residence Living Arrangements: Spouse/significant other   Type of Home: House Home Access: Ramped  entrance     Home Layout: One level Home Equipment: None;Wheelchair - manual      Prior Function Level of Independence: Independent               Hand Dominance        Extremity/Trunk Assessment   Upper Extremity Assessment Upper Extremity Assessment: Overall WFL for tasks assessed    Lower Extremity Assessment Lower Extremity Assessment: Generalized weakness (s/p R LE carcinoma resection)    Cervical / Trunk Assessment Cervical / Trunk Assessment: Normal  Communication   Communication: No difficulties  Cognition Arousal/Alertness: Awake/alert Behavior During Therapy: WFL for tasks assessed/performed Overall Cognitive Status: Within Functional Limits for tasks assessed                                        General Comments      Exercises     Assessment/Plan    PT Assessment Patient needs continued PT services  PT Problem List Decreased strength;Decreased mobility;Decreased range of motion;Decreased activity tolerance;Decreased balance;Decreased knowledge of use of DME;Pain       PT Treatment Interventions DME instruction;Gait training;Therapeutic activities;Therapeutic exercise;Patient/family education;Balance training;Functional mobility training    PT Goals (Current goals can be found  in the Care Plan section)  Acute Rehab PT Goals Patient Stated Goal: less pain. successful surgeries.  PT Goal Formulation: With patient Time For Goal Achievement: 04/13/17 Potential to Achieve Goals: Good    Frequency Min 3X/week   Barriers to discharge        Co-evaluation               AM-PAC PT "6 Clicks" Daily Activity  Outcome Measure Difficulty turning over in bed (including adjusting bedclothes, sheets and blankets)?: None Difficulty moving from lying on back to sitting on the side of the bed? : None Difficulty sitting down on and standing up from a chair with arms (e.g., wheelchair, bedside commode, etc,.)?: None Help needed  moving to and from a bed to chair (including a wheelchair)?: None Help needed walking in hospital room?: A Little Help needed climbing 3-5 steps with a railing? : A Little 6 Click Score: 22    End of Session   Activity Tolerance: Patient limited by pain Patient left: in bed;with call bell/phone within reach   PT Visit Diagnosis: Difficulty in walking, not elsewhere classified (R26.2);Pain Pain - Right/Left: Right Pain - part of body: Leg    Time: 1003-1015 PT Time Calculation (min) (ACUTE ONLY): 12 min   Charges:   PT Evaluation $PT Eval Low Complexity: 1 Procedure     PT G Codes:   PT G-Codes **NOT FOR INPATIENT CLASS** Functional Assessment Tool Used: AM-PAC 6 Clicks Basic Mobility;Clinical judgement Functional Limitation: Mobility: Walking and moving around Mobility: Walking and Moving Around Current Status (A7681): At least 1 percent but less than 20 percent impaired, limited or restricted Mobility: Walking and Moving Around Goal Status [phone]): At least 1 percent but less than 20 percent impaired, limited or restricted      Weston Anna, MPT Pager: [phone]

## 2017-03-30 NOTE — Progress Notes (Signed)
   Subjective/Chief Complaint: The patient is doing better today.  No further bleeding of the right thigh.   Objective: Vital signs in last 24 hours: Temp:  [97.7 F (36.5 C)-98.5 F (36.9 C)] 98.5 F (36.9 C) (07/26 0450) Pulse Rate:  [62-112] 67 (07/26 0450) Resp:  [12-24] 16 (07/26 0450) BP: (90-197)/(54-90) 101/62 (07/26 0450) SpO2:  [97 %-100 %] 99 % (07/26 0450) Weight:  [44.9 kg (99 lb)] 44.9 kg (99 lb) (07/26 0450)    Intake/Output from previous day: 07/25 0701 - 07/26 0700 In: 875 [I.V.:675; IV Piggyback:200] Out: 450 [Urine:450] Intake/Output this shift: No intake/output data recorded.  General appearance: alert, cooperative and mild distress Skin: No bleeding and dressing in place. Incision/Wound:  Lab Results:   Recent Labs  03/29/17 0829 03/29/17 1720  WBC 9.9 13.5*  HGB 14.4 12.5  HCT 40.4 35.6*  PLT 266 235   BMET  Recent Labs  03/29/17 0829  NA 138  K 3.9  CL 102  CO2 24  GLUCOSE 106*  BUN 10  CREATININE 0.71  CALCIUM 9.5   PT/INR No results for input(s): LABPROT, INR in the last 72 hours. ABG No results for input(s): PHART, HCO3 in the last 72 hours.  Invalid input(s): PCO2, PO2  Studies/Results: No results found.  Anti-infectives: Anti-infectives    Start     Dose/Rate Route Frequency Ordered Stop   03/29/17 2200  ciprofloxacin (CIPRO) IVPB 400 mg     400 mg 200 mL/hr over 60 Minutes Intravenous Every 12 hours 03/29/17 1611        Assessment/Plan: s/p * No surgery found * Needs PT eval and will plan d/c tomorrow.  LOS: 0 days    [name] 03/30/2017

## 2017-03-30 NOTE — Anesthesia Procedure Notes (Signed)
Procedure Name: Intubation Performed by: Gean Maidens Pre-anesthesia Checklist: Patient identified, Emergency Drugs available, Suction available, Patient being monitored and Timeout performed Patient Re-evaluated:Patient Re-evaluated prior to induction Oxygen Delivery Method: Circle system utilized Preoxygenation: Pre-oxygenation with 100% oxygen (rsi) Induction Type: IV induction Laryngoscope Size: Mac and 3 Grade View: Grade I Tube type: Oral Tube size: 7.0 mm Number of attempts: 1 Airway Equipment and Method: Stylet Placement Confirmation: ETT inserted through vocal cords under direct vision,  positive ETCO2,  CO2 detector and breath sounds checked- equal and bilateral Secured at: 21 cm Tube secured with: Tape Dental Injury: Teeth and Oropharynx as per pre-operative assessment

## 2017-03-30 NOTE — Op Note (Signed)
DATE OF OPERATION: 03/30/2017  LOCATION: [facility] Room inpatient  PREOPERATIVE DIAGNOSIS: Right leg basal cell carcinoma and bleeding  POSTOPERATIVE DIAGNOSIS: Same  PROCEDURE: Control of bleeding at right leg tumor resection  SURGEON: Lyndee Leo Sanger Huong Luthi, DO  EBL: 100 cc  CONDITION: Stable  COMPLICATIONS: None  INDICATION: The patient, Patricia Galloway, is a 66 y.o. female born on 03-Jun-1951, is here for treatment of a bleeding vessel from the bed of the right anterior thigh tumor resection.  Attempts were made to control the bleeding at the bedside.  Patient required OR for control of the bleeding.   PROCEDURE DETAILS:  The patient was seen prior to surgery and marked.  The IV antibiotics were given. The patient was taken to the operating room and given a general anesthetic. A standard time out was performed and all information was confirmed by those in the room. SCD was placed on the left leg.   The right leg was prepped and draped in the usual sterile fashion.  The bleeding was localized at the level of the muscle.  Attempts were made to suture the vessel but needed vessel clips which were placed.  This stopped the bleeding.  The bovie was used for any small areas.  The leg was irrigated with antibiotic solution and saline.  The adaptic was applied with the VAC.  There was an excellent seal.  The pressure was set at 75 mm Hg. The patient was allowed to wake up and taken to recovery room in stable condition at the end of the case. The family was notified at the end of the case.

## 2017-03-30 NOTE — Anesthesia Preprocedure Evaluation (Addendum)
Anesthesia Evaluation  Patient identified by MRN, date of birth, ID band Patient awake    Reviewed: Allergy & Precautions, NPO status , Patient's Chart, lab work & pertinent test results  Airway Mallampati: II  TM Distance: >3 FB     Dental   Pulmonary asthma , COPD, former smoker,    breath sounds clear to auscultation       Cardiovascular + Peripheral Vascular Disease   Rhythm:Regular Rate:Normal     Neuro/Psych    GI/Hepatic negative GI ROS, Neg liver ROS,   Endo/Other  negative endocrine ROS  Renal/GU negative Renal ROS     Musculoskeletal   Abdominal   Peds  Hematology  (+) anemia ,   Anesthesia Other Findings   Reproductive/Obstetrics                             Anesthesia Physical Anesthesia Plan  ASA: III  Anesthesia Plan: General   Post-op Pain Management:    Induction: Intravenous, Rapid sequence and Cricoid pressure planned  PONV Risk Score and Plan: 3 and Ondansetron, Propofol and Treatment may vary due to age or medical condition  Airway Management Planned: Oral ETT  Additional Equipment:   Intra-op Plan:   Post-operative Plan: Extubation in OR  Informed Consent: I have reviewed the patients History and Physical, chart, labs and discussed the procedure including the risks, benefits and alternatives for the proposed anesthesia with the patient or authorized representative who has indicated his/her understanding and acceptance.   Dental advisory given  Plan Discussed with: Anesthesiologist, CRNA and Surgeon  Anesthesia Plan Comments:        Anesthesia Quick Evaluation

## 2017-03-30 NOTE — Transfer of Care (Signed)
Immediate Anesthesia Transfer of Care Note  Patient: Patricia Galloway  Procedure(s) Performed: Procedure(s): IRRIGATION AND DEBRIDEMENT WOUND; VAC PLACEMENT (Right)  Patient Location: PACU  Anesthesia Type:General  Level of Consciousness: awake, alert  and oriented  Airway & Oxygen Therapy: Patient Spontanous Breathing and Patient connected to face mask oxygen  Post-op Assessment: Report given to RN and Post -op Vital signs reviewed and stable  Post vital signs: Reviewed and stable  Last Vitals:  Vitals:   03/30/17 1445 03/30/17 1802  BP: 121/71   Pulse: 72 (!) 114  Resp: 16   Temp: 37.1 C 36.6 C    Last Pain:  Vitals:   03/30/17 1630  TempSrc:   PainSc: 10-Worst pain ever      Patients Stated Pain Goal: 2 (17/00/17 4944)  Complications: No apparent anesthesia complications

## 2017-03-30 NOTE — Progress Notes (Signed)
Dr. Marla Roe at bedside, patient assessed, noted to have a bleeding artery from wound, pressure applied to wound by MD.  Patient taken to OR at this time.  This nurse asked patient if she would like for me to notify her family, patient states. " God, no, dont call any of them yet."

## 2017-03-30 NOTE — Progress Notes (Signed)
PHARMACIST - PHYSICIAN ORDER COMMUNICATION  CONCERNING: P&T Medication Policy on Herbal Medications  DESCRIPTION:  This patient's order for:  spirulina  has been noted.  This product(s) is classified as an "herbal" or natural product. Due to a lack of definitive safety studies or FDA approval, nonstandard manufacturing practices, plus the potential risk of unknown drug-drug interactions while on inpatient medications, the Pharmacy and Therapeutics Committee does not permit the use of "herbal" or natural products of this type within [facility].   ACTION TAKEN: The pharmacy department is unable to verify this order at this time and the order has been discontinued. Please reevaluate patient's clinical condition at discharge and address if the herbal or natural product(s) should be resumed at that time.  Royetta Asal, PharmD, BCPS Pager [phone] 03/30/2017 7:49 PM

## 2017-03-30 NOTE — Plan of Care (Signed)
Problem: Physical Regulation: Goal: Ability to maintain clinical measurements within normal limits will improve Outcome: Progressing Patient agrees to take Percocet vs. Only morphine. Pain control is improving.

## 2017-03-30 NOTE — Anesthesia Postprocedure Evaluation (Signed)
Anesthesia Post Note  Patient: Patricia Galloway  Procedure(s) Performed: Procedure(s) (LRB): IRRIGATION AND DEBRIDEMENT WOUND; VAC PLACEMENT (Right)     Patient location during evaluation: PACU Anesthesia Type: General Level of consciousness: awake Pain management: pain level controlled Respiratory status: spontaneous breathing Cardiovascular status: stable Anesthetic complications: no    Last Vitals:  Vitals:   03/30/17 1845 03/30/17 1858  BP: 128/71 (!) 146/70  Pulse: 91 85  Resp: 12 14  Temp: 36.4 C (!) 36.3 C    Last Pain:  Vitals:   03/30/17 1845  TempSrc:   PainSc: Asleep                 Aylene Acoff

## 2017-03-30 NOTE — Anesthesia Postprocedure Evaluation (Signed)
Anesthesia Post Note  Patient: Patricia Galloway  Procedure(s) Performed: Procedure(s) (LRB): IRRIGATION AND DEBRIDEMENT WOUND; VAC PLACEMENT (Right)     Patient location during evaluation: PACU Anesthesia Type: General Level of consciousness: awake Pain management: pain level controlled Vital Signs Assessment: post-procedure vital signs reviewed and stable Respiratory status: spontaneous breathing Cardiovascular status: stable Anesthetic complications: no    Last Vitals:  Vitals:   03/30/17 1845 03/30/17 1858  BP: 128/71 (!) 146/70  Pulse: 91 85  Resp: 12 14  Temp: 36.4 C (!) 36.3 C    Last Pain:  Vitals:   03/30/17 1845  TempSrc:   PainSc: Asleep                 Tito Ausmus

## 2017-03-31 ENCOUNTER — Encounter (HOSPITAL_COMMUNITY): Payer: Self-pay | Admitting: Plastic Surgery

## 2017-03-31 DIAGNOSIS — R269 Unspecified abnormalities of gait and mobility: Secondary | ICD-10-CM | POA: Diagnosis not present

## 2017-03-31 DIAGNOSIS — J4520 Mild intermittent asthma, uncomplicated: Secondary | ICD-10-CM

## 2017-03-31 DIAGNOSIS — Z9221 Personal history of antineoplastic chemotherapy: Secondary | ICD-10-CM | POA: Diagnosis not present

## 2017-03-31 DIAGNOSIS — F419 Anxiety disorder, unspecified: Secondary | ICD-10-CM | POA: Diagnosis present

## 2017-03-31 DIAGNOSIS — D649 Anemia, unspecified: Secondary | ICD-10-CM | POA: Diagnosis present

## 2017-03-31 DIAGNOSIS — T8189XA Other complications of procedures, not elsewhere classified, initial encounter: Secondary | ICD-10-CM | POA: Diagnosis not present

## 2017-03-31 DIAGNOSIS — Z833 Family history of diabetes mellitus: Secondary | ICD-10-CM | POA: Diagnosis not present

## 2017-03-31 DIAGNOSIS — F4321 Adjustment disorder with depressed mood: Secondary | ICD-10-CM | POA: Diagnosis present

## 2017-03-31 DIAGNOSIS — Z880 Allergy status to penicillin: Secondary | ICD-10-CM | POA: Diagnosis not present

## 2017-03-31 DIAGNOSIS — Y839 Surgical procedure, unspecified as the cause of abnormal reaction of the patient, or of later complication, without mention of misadventure at the time of the procedure: Secondary | ICD-10-CM | POA: Diagnosis not present

## 2017-03-31 DIAGNOSIS — Z8249 Family history of ischemic heart disease and other diseases of the circulatory system: Secondary | ICD-10-CM | POA: Diagnosis not present

## 2017-03-31 DIAGNOSIS — C44712 Basal cell carcinoma of skin of right lower limb, including hip: Secondary | ICD-10-CM | POA: Diagnosis not present

## 2017-03-31 DIAGNOSIS — I719 Aortic aneurysm of unspecified site, without rupture: Secondary | ICD-10-CM | POA: Diagnosis present

## 2017-03-31 DIAGNOSIS — Z681 Body mass index (BMI) 19 or less, adult: Secondary | ICD-10-CM | POA: Diagnosis not present

## 2017-03-31 DIAGNOSIS — Z885 Allergy status to narcotic agent status: Secondary | ICD-10-CM | POA: Diagnosis not present

## 2017-03-31 DIAGNOSIS — J439 Emphysema, unspecified: Secondary | ICD-10-CM | POA: Diagnosis present

## 2017-03-31 DIAGNOSIS — L7621 Postprocedural hemorrhage and hematoma of skin and subcutaneous tissue following a dermatologic procedure: Secondary | ICD-10-CM | POA: Diagnosis not present

## 2017-03-31 DIAGNOSIS — Z818 Family history of other mental and behavioral disorders: Secondary | ICD-10-CM | POA: Diagnosis not present

## 2017-03-31 DIAGNOSIS — Z91040 Latex allergy status: Secondary | ICD-10-CM | POA: Diagnosis not present

## 2017-03-31 DIAGNOSIS — Z91048 Other nonmedicinal substance allergy status: Secondary | ICD-10-CM | POA: Diagnosis not present

## 2017-03-31 DIAGNOSIS — Z882 Allergy status to sulfonamides status: Secondary | ICD-10-CM | POA: Diagnosis not present

## 2017-03-31 DIAGNOSIS — E43 Unspecified severe protein-calorie malnutrition: Secondary | ICD-10-CM | POA: Diagnosis present

## 2017-03-31 DIAGNOSIS — Z8262 Family history of osteoporosis: Secondary | ICD-10-CM | POA: Diagnosis not present

## 2017-03-31 DIAGNOSIS — Z8349 Family history of other endocrine, nutritional and metabolic diseases: Secondary | ICD-10-CM | POA: Diagnosis not present

## 2017-03-31 DIAGNOSIS — Z888 Allergy status to other drugs, medicaments and biological substances status: Secondary | ICD-10-CM | POA: Diagnosis not present

## 2017-03-31 DIAGNOSIS — Z87891 Personal history of nicotine dependence: Secondary | ICD-10-CM | POA: Diagnosis not present

## 2017-03-31 DIAGNOSIS — I739 Peripheral vascular disease, unspecified: Secondary | ICD-10-CM | POA: Diagnosis present

## 2017-03-31 MED ORDER — FLUTICASONE FUROATE-VILANTEROL 100-25 MCG/INH IN AEPB: 1.0000 | INHALATION_SPRAY | Freq: Every day | RESPIRATORY_TRACT | Status: DC

## 2017-03-31 MED ORDER — OXYCODONE-ACETAMINOPHEN 5-325 MG PO TABS
1.0000 | ORAL_TABLET | ORAL | Status: DC | PRN
  Administered 2017-03-31 – 2017-04-01 (×6): 1 via ORAL
  Filled 2017-03-31 (×6): qty 1

## 2017-03-31 MED ORDER — BOOST / RESOURCE BREEZE PO LIQD
1.0000 | Freq: Three times a day (TID) | ORAL | Status: DC
  Administered 2017-03-31: 1 via ORAL

## 2017-03-31 NOTE — Progress Notes (Addendum)
Initial Nutrition Assessment  DOCUMENTATION CODES:   Severe malnutrition in context of chronic illness, Underweight  INTERVENTION:    Boost Breeze po TID, each supplement provides 250 kcal and 9 grams of protein  NUTRITION DIAGNOSIS:   Malnutrition (severe) related to catabolic illness (leg basal cell carcinoma) as evidenced by severe depletion of body fat, severe depletion of muscle mass, percent weight loss (24% x 1 year)  GOAL:   Patient will meet greater than or equal to 90% of their needs  MONITOR:   PO intake, Supplement acceptance, Labs, Weight trends, Skin, I & O's  REASON FOR ASSESSMENT:   Malnutrition Screening Tool  ASSESSMENT:   66 yo female with hx of COPD and BCC s/p surgical resection from anterior thigh complicated by bleeding vessel s/p surgical repair in OR with applied VAC in place. Internal Medicine was consulted to check for bleeding and COPD management.    Pt s/p procedure 7/26: Control of bleeding at right leg tumor resection  Pt reports she ate some of her pot roast, says her potato casserole was dry. At home she eats 3 meals and 3 snacks per day. Also makes smoothies with whey protein. Endorses a 20 lb unintentional weight loss (24%) over the last year. Severe for time frame.  Amenable to trying berry flavored Boost Breeze nutrition supplement. Medications reviewed and include MVI with minerals. Labs reviewed. Na 134 (L). CBG 115.  Nutrition-Focused physical exam completed. Findings are severe fat depletion, severe muscle depletion, and no edema.   Diet Order:  Diet regular Room service appropriate? Yes; Fluid consistency: Thin  Skin:  Wound (see comment) (R leg wound VAC)  Last BM:  7/26  Height:   Ht Readings from Last 1 Encounters:  03/30/17 5\' 3"  (1.6 m)   Weight:   Wt Readings from Last 1 Encounters:  03/30/17 99 lb (44.9 kg)   Ideal Body Weight:  52.2 kg  BMI:  Body mass index is 17.54 kg/m.  Estimated Nutritional Needs:    Kcal:  1500-1700  Protein:  75-90 gm  Fluid:  1.5-1.7 L  EDUCATION NEEDS:   No education needs identified at this time  Arthur Holms, RD, LDN Pager #: [phone] After-Hours Pager #: [phone]

## 2017-03-31 NOTE — Progress Notes (Signed)
[employer] Auth completed. Faxed to [facility], awaiting processing.

## 2017-03-31 NOTE — Progress Notes (Signed)
PT Cancellation Note  Patient Details Name: Patricia Galloway MRN: [id number] DOB: Mar 13, 1951   Cancelled Treatment:    Reason Eval/Treat Not Completed: Attempted PT tx session-pt declined. Pt reports she has been walking to and from bathroom with a RW. Continue to recommend a RW for safe ambulation   Weston Anna, MPT Pager: [phone]

## 2017-03-31 NOTE — Progress Notes (Signed)
PROGRESS NOTE                                                                                                                                                                                                             Patient Demographics:    Patricia Galloway, is a 66 y.o. female, DOB - 1950-12-11, GBT:[id number]  Admit date - 03/29/2017   Admitting Physician [name], DO  Outpatient Primary MD for the patient is [name], MD  LOS - 0    No chief complaint on file.      Brief Narrative  66 year old female with history of childhood asthma (symptoms reoccurred in the past 2 years after a gap of almost 30 years), emphysema, basal cell carcinoma with surgical resection of the anterior thigh which was complicated by bleeding vessel requiring surgical repair in the OR and application of VAC. Hospitalist consulted for postoperative anemia and management of her underlying asthma.    Subjective:    Patient denies any shortness of breath, chest pain, dizziness, nausea or vomiting. Left eye feels sore.   Assessment  & Plan :   Principal problem Asthma/COPD No signs of exacerbation. Reports last asthma attack to be almost a year back and get symptoms about 1-2 times a year. Continue Breo inhaler and as needed albuterol nebulizer. Asymptomatic. No further recommendation.  Right leg tumor resection with bleeding Underwent aggressive clipping over the bleeding vessels of her right thigh in the OR on 7/26. VAC in place. Hemoglobin of 10.2 and stable. Empiric antibiotics and pain control per primary.   No further recommendations from medical standpoint. Please call for any further questions. Will sign off.  DVT Prophylaxis  : SCDs  Lab Results  Component Value Date   PLT 221 03/30/2017    Antibiotics  :   Anti-infectives    Start     Dose/Rate Route Frequency Ordered Stop   03/30/17 1727  polymyxin B  500,000 Units, bacitracin 50,000 Units in sodium chloride 0.9 % 500 mL irrigation  Status:  Discontinued       As needed 03/30/17 1728 03/30/17 1803   03/30/17 1653  ciprofloxacin (CIPRO) 400 MG/200ML IVPB    Comments:  Herbie Drape   : cabinet override      03/30/17 1653 03/30/17 1728   03/29/17 2200  ciprofloxacin (CIPRO) IVPB 400  mg     400 mg 200 mL/hr over 60 Minutes Intravenous Every 12 hours 03/29/17 1611          Objective:   Vitals:   03/30/17 2210 03/30/17 2324 03/31/17 0144 03/31/17 0541  BP: (!) 152/92 123/74 116/78 139/87  Pulse: 82 77 77 72  Resp: 16 14 14 16   Temp: 98.1 F (36.7 C) 98 F (36.7 C) 98.5 F (36.9 C) 98.1 F (36.7 C)  TempSrc: Axillary Axillary Oral Oral  SpO2: 100% 100% 100% 100%  Weight:      Height:        Wt Readings from Last 3 Encounters:  03/30/17 44.9 kg (99 lb)  03/29/17 44.9 kg (99 lb)  03/15/17 44.9 kg (99 lb)     Intake/Output Summary (Last 24 hours) at 03/31/17 1130 Last data filed at 03/31/17 1022  Gross per 24 hour  Intake          3658.75 ml  Output             2950 ml  Net           708.75 ml     Physical Exam  Gen: Pleasant female not in distress HEENT: no pallor, moist mucosa, supple neck Chest: clear b/l, no added sounds CVS: N S1&S2, no murmurs,  GI: soft, NT, ND,  Musculoskeletal: warm, VAC of right thigh     Data Review:    CBC  Recent Labs Lab 03/29/17 0829 03/29/17 1720 03/30/17 1724 03/30/17 1916  WBC 9.9 13.5*  --  12.5*  HGB 14.4 12.5 10.9* 10.2*  HCT 40.4 35.6* 32.0* 29.1*  PLT 266 235  --  221  MCV 88.8 89.4  --  87.9  MCH 31.6 31.4  --  30.8  MCHC 35.6 35.1  --  35.1  RDW 13.5 13.5  --  13.2  LYMPHSABS 1.9  --   --  1.6  MONOABS 0.9  --   --  0.8  EOSABS 0.4  --   --  0.2  BASOSABS 0.1  --   --  0.0    Chemistries   Recent Labs Lab 03/29/17 0829 03/30/17 1724 03/30/17 1916  NA 138 136 134*  K 3.9 4.6 3.9  CL 102  --  102  CO2 24  --  27  GLUCOSE 106*  --  115*  BUN  10  --  9  CREATININE 0.71  --  0.67  CALCIUM 9.5  --  8.2*  AST 33  --  54*  ALT 20  --  21  ALKPHOS 58  --  42  BILITOT 0.6  --  0.2*   ------------------------------------------------------------------------------------------------------------------ No results for input(s): CHOL, HDL, LDLCALC, TRIG, CHOLHDL, LDLDIRECT in the last 72 hours.  Lab Results  Component Value Date   HGBA1C 4.5 08/08/2016   ------------------------------------------------------------------------------------------------------------------ No results for input(s): TSH, T4TOTAL, T3FREE, THYROIDAB in the last 72 hours.  Invalid input(s): FREET3 ------------------------------------------------------------------------------------------------------------------ No results for input(s): VITAMINB12, FOLATE, FERRITIN, TIBC, IRON, RETICCTPCT in the last 72 hours.  Coagulation profile No results for input(s): INR, PROTIME in the last 168 hours.  No results for input(s): DDIMER in the last 72 hours.  Cardiac Enzymes No results for input(s): CKMB, TROPONINI, MYOGLOBIN in the last 168 hours.  Invalid input(s): CK ------------------------------------------------------------------------------------------------------------------ No results found for: BNP  Inpatient Medications  Scheduled Meds: . acetaminophen  1,000 mg Oral Q6H  . fluticasone furoate-vilanterol  1 puff Inhalation Daily  . multivitamin with minerals  2 tablet Oral Daily   Continuous Infusions: . sodium chloride    . ciprofloxacin Stopped (03/30/17 2256)  . dextrose 5 % and 0.45 % NaCl with KCl 20 mEq/L 75 mL/hr at 03/30/17 1309   PRN Meds:.albuterol, diazepam, morphine injection, morphine injection, ondansetron **OR** ondansetron (ZOFRAN) IV, ondansetron, oxyCODONE-acetaminophen  Micro Results No results found for this or any previous visit (from the past 240 hour(s)).  Radiology Reports No results found.  Time Spent in minutes   25   Louellen Molder M.D on 03/31/2017 at 11:30 AM  Between 7am to 7pm - Pager - [phone]  After 7pm go to [email or web address] - password [facility]  [facility] -  Office  [phone]

## 2017-03-31 NOTE — Progress Notes (Signed)
Per Dr. Marla Roe wound measures 14x14x3cm.

## 2017-03-31 NOTE — Progress Notes (Signed)
Spoke with patient at bedside. Return to OR and now has wound vac, plan for change weekly. Application for Vac faxed to Dr. Marla Roe for signature, once returned will complete application process for insurance approval. Unsure of [place] needs since vac plan for weekly change. Will readdress with attending to confirm plan.

## 2017-03-31 NOTE — Care Management Obs Status (Signed)
[place] NOTIFICATION   Patient Details  Name: Patricia Galloway MRN: [id number] Date of Birth: Oct 16, 1950   Medicare Observation Status Notification Given:  Yes    Guadalupe Maple, RN 03/31/2017, 12:31 PM

## 2017-04-01 NOTE — Discharge Summary (Signed)
Physician Discharge Summary  Patient ID: Patricia Galloway MRN: [id number] DOB/AGE: June 05, 1951 66 y.o.  Admit date: 03/29/2017 Discharge date: 04/01/2017  Admission Diagnoses:  Discharge Diagnoses:  Active Problems:   Pain in limb   Basal cell carcinoma   Protein-calorie malnutrition, severe   Discharged Condition: good  Hospital Course: The patient underwent surgical resection of the basal cell carcinoma on her leg.  She was sent home but has some bleeding and severe pain.  She was admitted and the following day had severe bleeding.  She was taken to the OR for control of the bleeding and placement of the VAC. She is doing much better with her pain controlled and no further bleeding.  Consults: Medicine  Significant Diagnostic Studies: labs  Treatments: IV hydration and surgery: PT and negative pressure therapy.  Discharge Exam: Blood pressure 136/71, pulse 86, temperature 99.6 F (37.6 C), temperature source Oral, resp. rate 16, height 5\' 3"  (1.6 m), weight 44.9 kg (99 lb), SpO2 97 %. General appearance: alert, cooperative and no distress Incision/Wound:  Disposition: 01-Home or Self Care  Discharge Instructions    Call MD for:  difficulty breathing, headache or visual disturbances    Complete by:  As directed    Call MD for:  hives    Complete by:  As directed    Call MD for:  persistant dizziness or light-headedness    Complete by:  As directed    Call MD for:  redness, tenderness, or signs of infection (pain, swelling, redness, odor or green/yellow discharge around incision site)    Complete by:  As directed    Call MD for:  severe uncontrolled pain    Complete by:  As directed    Diet general    Complete by:  As directed    Discharge wound care:    Complete by:  As directed    VAC with once a week vac change   Driving Restrictions    Complete by:  As directed    No driving until off all pain meds   Increase activity slowly    Complete by:  As directed    Lifting restrictions    Complete by:  As directed    No heavy lifting      Follow-up Information    Aiyonna Lucado, Loel Lofty, DO Follow up in 1 week(s).   Specialty:  Plastic Surgery Contact information: [place] [address] [phone]        [facility].   Specialty:  [place] Why:  Home Health Physical Therapy Contact information: [place] [address] [phone]           Signed: [name] 04/01/2017, 3:54 PM

## 2017-04-01 NOTE — Progress Notes (Signed)
Assessment unchanged. Pt verbalized understanding of dc instructions through teach back. "I have an appointment with Dr. Marla Roe on Tuesday for dressing change." No scripts at dc. "My script for Percocet has already been filled." Wound vac equipment and walker in room. Small, portable wound vac applied for home. Discharged via wc accompanied by NT x 2 to meet awaiting vehicle and cousin at front entrance.

## 2017-04-01 NOTE — Progress Notes (Signed)
2 Days Post-Op   Subjective/Chief Complaint: The patient is sitting on side of bed.  Pain is better controlled but still high.  She has started to ambulate more.  Eating without difficulty.   Objective: Vital signs in last 24 hours: Temp:  [98.7 F (37.1 C)-98.8 F (37.1 C)] 98.8 F (37.1 C) (07/28 0424) Pulse Rate:  [78-87] 82 (07/28 0741) Resp:  [16-18] 16 (07/28 0741) BP: (138-176)/(73-90) 176/90 (07/28 0424) SpO2:  [96 %-99 %] 96 % (07/28 0741) Last BM Date: 03/30/17  Intake/Output from previous day: 07/27 0701 - 07/28 0700 In: 2471.3 [P.O.:670; I.V.:1801.3] Out: 4375 [Urine:4375] Intake/Output this shift: No intake/output data recorded.  General appearance: alert, cooperative and no distress Head: Normocephalic, without obvious abnormality, atraumatic Incision/Wound: VAC in place with good seal.  Lab Results:   Recent Labs  03/29/17 1720 03/30/17 1724 03/30/17 1916  WBC 13.5*  --  12.5*  HGB 12.5 10.9* 10.2*  HCT 35.6* 32.0* 29.1*  PLT 235  --  221   BMET  Recent Labs  03/30/17 1724 03/30/17 1916  NA 136 134*  K 4.6 3.9  CL  --  102  CO2  --  27  GLUCOSE  --  115*  BUN  --  9  CREATININE  --  0.67  CALCIUM  --  8.2*   PT/INR No results for input(s): LABPROT, INR in the last 72 hours. ABG  Recent Labs  03/30/17 1724  HCO3 25.6    Studies/Results: No results found.  Anti-infectives: Anti-infectives    Start     Dose/Rate Route Frequency Ordered Stop   03/30/17 1727  polymyxin B 500,000 Units, bacitracin 50,000 Units in sodium chloride 0.9 % 500 mL irrigation  Status:  Discontinued       As needed 03/30/17 1728 03/30/17 1803   03/30/17 1653  ciprofloxacin (CIPRO) 400 MG/200ML IVPB    Comments:  Herbie Drape   : cabinet override      03/30/17 1653 03/30/17 1728   03/29/17 2200  ciprofloxacin (CIPRO) IVPB 400 mg     400 mg 200 mL/hr over 60 Minutes Intravenous Every 12 hours 03/29/17 1611        Assessment/Plan: s/p  Procedure(s): IRRIGATION AND DEBRIDEMENT WOUND; VAC PLACEMENT (Right) Discharge when home VAC available.  LOS: 1 day    [name] 04/01/2017

## 2017-04-01 NOTE — Care Management Note (Signed)
Case Management Note  Patient Details  Name: Patricia Galloway MRN: [id number] Date of Birth: June 16, 1951  Subjective/Objective:   Basal cell carcinoma complicated by bleeding vessel of right thigh, wound vac placed,. COPD                 Action/Plan: Discharge Planning: NCM spoke to pt and offered choice for HH/list provided. Pt agreeable to [facility]. Contacted [facility]. Wound vac from [employer] was delivered to room. Contacted [facility] DME rep for RW for home. Delivered to room. Made [facility] aware that initial wound vac change will done in MD office and to follow up with surgeon for scheduled changes.  PCP [name] MD   Expected Discharge Date:  04/01/2017               Expected Discharge Plan:  [place]  In-House Referral:  NA  Discharge planning Services  CM Consult  Post Acute Care Choice:  Durable Medical Equipment, Home Health Choice offered to:  Patient  DME Arranged:  Negative pressure wound device, Walker rolling DME Agency:  [place]:  RN [place] Agency:  [facility] (formerly [facility])  Status of Service:  Completed, signed off  If discussed at [employer], dates discussed:    Additional Comments:  Erenest Rasher, RN 04/01/2017, 2:41 PM

## 2017-04-01 NOTE — Discharge Instructions (Addendum)
Discharge patient to home and self care

## 2017-04-03 ENCOUNTER — Encounter (HOSPITAL_COMMUNITY): Payer: Self-pay | Admitting: Plastic Surgery

## 2017-04-03 ENCOUNTER — Telehealth: Payer: Self-pay | Admitting: *Deleted

## 2017-04-03 NOTE — Telephone Encounter (Signed)
Pharmacy called to verify that patient was no longer taking Erivedge.                 Verified.

## 2017-04-04 ENCOUNTER — Telehealth: Payer: Self-pay | Admitting: Family Medicine

## 2017-04-04 NOTE — Telephone Encounter (Signed)
FYI: [facility] called stating they will be going out to patients home tomorrow to start her care.

## 2017-04-11 NOTE — Telephone Encounter (Signed)
Correct

## 2017-04-13 DIAGNOSIS — Z483 Aftercare following surgery for neoplasm: Secondary | ICD-10-CM | POA: Diagnosis not present

## 2017-04-13 DIAGNOSIS — D649 Anemia, unspecified: Secondary | ICD-10-CM | POA: Diagnosis not present

## 2017-04-13 DIAGNOSIS — C44712 Basal cell carcinoma of skin of right lower limb, including hip: Secondary | ICD-10-CM | POA: Diagnosis not present

## 2017-04-13 DIAGNOSIS — E43 Unspecified severe protein-calorie malnutrition: Secondary | ICD-10-CM | POA: Diagnosis not present

## 2017-04-17 ENCOUNTER — Encounter: Payer: Self-pay | Admitting: Family Medicine

## 2017-04-21 DIAGNOSIS — Z483 Aftercare following surgery for neoplasm: Secondary | ICD-10-CM | POA: Diagnosis not present

## 2017-04-21 DIAGNOSIS — E43 Unspecified severe protein-calorie malnutrition: Secondary | ICD-10-CM | POA: Diagnosis not present

## 2017-04-21 DIAGNOSIS — C44712 Basal cell carcinoma of skin of right lower limb, including hip: Secondary | ICD-10-CM | POA: Diagnosis not present

## 2017-04-21 DIAGNOSIS — D649 Anemia, unspecified: Secondary | ICD-10-CM | POA: Diagnosis not present

## 2017-04-24 DIAGNOSIS — E43 Unspecified severe protein-calorie malnutrition: Secondary | ICD-10-CM | POA: Diagnosis not present

## 2017-04-24 DIAGNOSIS — D649 Anemia, unspecified: Secondary | ICD-10-CM | POA: Diagnosis not present

## 2017-04-24 DIAGNOSIS — Z483 Aftercare following surgery for neoplasm: Secondary | ICD-10-CM | POA: Diagnosis not present

## 2017-04-24 DIAGNOSIS — C44712 Basal cell carcinoma of skin of right lower limb, including hip: Secondary | ICD-10-CM | POA: Diagnosis not present

## 2017-04-24 NOTE — Progress Notes (Signed)
[place]  [phone] [phone]  ID: Jacqlyn Krauss OB: May 28, 1951  MR#: [id number]  NUU#:[id number]  Patient Care Team: [name], MD as PCP - General (Family Medicine) [name], MD as Attending Physician (Family Medicine) Robert Bellow, MD (General Surgery)  CHIEF COMPLAINT: Iron deficiency anemia, locally advanced basal cell carcinoma of the right leg.  INTERVAL HISTORY: Patient returns to clinic today for repeat laboratory work and further evaluation. She underwent complete resection of her residual malignancy in her right leg last week and tolerated the procedure well. She continues to have a wound VAC on and has follow-up with surgery later this week. She otherwise feels well. She has no neurologic complaints. She denies any recent fevers. She has no chest pain or shortness of breath. She denies any vomiting, constipation, or diarrhea. She has no urinary complaints. Patient offers no further specific complaints today.  REVIEW OF SYSTEMS:   Review of Systems  Constitutional: Negative for fever, malaise/fatigue and weight loss.  Respiratory: Negative.  Negative for cough and shortness of breath.   Cardiovascular: Negative.  Negative for chest pain and leg swelling.  Gastrointestinal: Negative for abdominal pain, nausea and vomiting.  Genitourinary: Negative.   Musculoskeletal: Negative.   Skin: Negative.  Negative for rash.  Neurological: Negative for sensory change, focal weakness and weakness.  Psychiatric/Behavioral: Negative.  The patient is not nervous/anxious.     As per HPI. Otherwise, a complete review of systems is negative.  PAST MEDICAL HISTORY: Past Medical History:  Diagnosis Date  . Anemia    has had iron infusion  . Anxiety    complicated grief  . Aortic aneurysm ([place])    found on CT in December, 2017  . Asthma    as child , but no episodes in over 30 years  . Cancer ([place]) 08/2016   basal cell  carcinoma - right leg  . History of transfusion of packed red blood cells     PAST SURGICAL HISTORY: Past Surgical History:  Procedure Laterality Date  . BASAL CELL CARCINOMA EXCISION Right 03/29/2017   Procedure: EXCISION OF RIGHT LEG BASEL CELL CARCINOMA;  Surgeon: [name], DO;  Location: [facility];  Service: Plastics;  Laterality: Right;  . COLON SURGERY  2002   colostomy for 10 months after a perfurated sigmoid   . COLOSTOMY REVERSAL  2003  . EMBOLIZATION Right 10/12/2016   Procedure: Embolization;  Surgeon: [name], MD;  Location: [facility];  Service: Cardiovascular;  Laterality: Right;  . etopic  3474,2595  . INCISION AND DRAINAGE OF WOUND Right 03/30/2017   Procedure: IRRIGATION AND DEBRIDEMENT WOUND; VAC PLACEMENT;  Surgeon: [name], DO;  Location: [facility];  Service: Plastics;  Laterality: Right;  . TONSILLECTOMY      FAMILY HISTORY: Family History  Problem Relation Age of Onset  . Dementia Mother   . Aortic stenosis Mother   . Osteoporosis Mother   . Dementia Father   . Diabetes Father   . Hyperlipidemia Father   . Hypertension Father   . Aneurysm Sister   . Hyperlipidemia Sister   . Hypertension Sister   . Diabetes Sister     ADVANCED DIRECTIVES (Y/N):  N  HEALTH MAINTENANCE: Social History  Substance Use Topics  . Smoking status: Former Smoker    Packs/day: 0.50    Years: 25.00    Types: Cigarettes    Quit date: 08/03/1998  . Smokeless tobacco: Never Used  . Alcohol use  No     Colonoscopy:  PAP:  Bone density:  Lipid panel:  Allergies  Allergen Reactions  . Penicillins Anaphylaxis    Has patient had a PCN reaction causing immediate rash, facial/tongue/throat swelling, SOB or lightheadedness with hypotension: Yes Has patient had a PCN reaction causing severe rash involving mucus membranes or skin necrosis: No Has patient had a PCN reaction that required hospitalization: No Has patient had a PCN reaction  occurring within the last 10 years: No If all of the above answers are "NO", then may proceed with Cephalosporin use.   . Codeine Nausea And Vomiting  . Latex Itching  . Sulfur Other (See Comments)    Reaction:  Redness/made skin bleed   . Tape Other (See Comments)    Reaction:  Blisters and burning  Pt states that paper tape is okay.    [address] Hcl] Anxiety    Current Outpatient Prescriptions  Medication Sig Dispense Refill  . acidophilus (RISAQUAD) CAPS capsule Take 3 capsules by mouth at bedtime.    . cholecalciferol (VITAMIN D) 1000 units tablet Take 1,000 Units by mouth 2 (two) times daily.    . Coenzyme Q10 (COQ10) 100 MG CAPS Take 100 mg by mouth 2 (two) times daily.    . Digestive Enzyme CAPS Take 1 capsule by mouth 3 (three) times daily after meals.     . fluticasone furoate-vilanterol (BREO ELLIPTA) 100-25 MCG/INH AEPB Inhale 1 puff into the lungs daily. 60 each 5  . Ginkgo Biloba Extract (GNP GINGKO BILOBA EXTRACT) 60 MG CAPS Take 60 mg by mouth daily.     . magnesium 30 MG tablet Take 60 mg by mouth daily.     . Multiple Vitamin (MULTIVITAMIN) capsule Take 2 capsules by mouth daily.     Marland Kitchen OVER THE COUNTER MEDICATION Take 1 tablet by mouth 2 (two) times daily. Mushroom Formula - Stamets 7    . OVER THE COUNTER MEDICATION Take 1 tablet by mouth 2 (two) times daily. Nutri-Calm    . Spirulina 500 MG TABS Take 500 mg by mouth daily.     Marland Kitchen UNABLE TO FIND Med Name: Alinda Dooms    . UNABLE TO FIND Med Name: liver care    . albuterol (PROVENTIL HFA;VENTOLIN HFA) 108 (90 Base) MCG/ACT inhaler Inhale 2 puffs into the lungs every 6 (six) hours as needed for wheezing or shortness of breath. (Patient not taking: Reported on 04/25/2017) 1 Inhaler 1  . diazepam (VALIUM) 5 MG tablet Take 1 tablet (5 mg total) by mouth every 12 (twelve) hours as needed for anxiety. (Patient not taking: Reported on 04/25/2017) 30 tablet 0  . oxyCODONE-acetaminophen (ROXICET) 5-325 MG tablet Take 1  tablet by mouth every 6 (six) hours as needed for moderate pain or severe pain. (Patient not taking: Reported on 04/25/2017) 20 tablet 0   No current facility-administered medications for this visit.     OBJECTIVE: Vitals:   04/25/17 1523  BP: (!) 176/90  Pulse: 98  Resp: 18  Temp: 97.6 F (36.4 C)     Body mass index is 18.02 kg/m.    ECOG FS:0 - Asymptomatic  General: Well-developed, well-nourished, no acute distress. Eyes: Pink conjunctiva, anicteric sclera. Lungs: Clear to auscultation bilaterally. Heart: Regular rate and rhythm. No rubs, murmurs, or gallops. Abdomen: Soft, nontender, nondistended. No organomegaly noted, normoactive bowel sounds. Musculoskeletal: No edema, cyanosis, or clubbing. Neuro: Alert, answering all questions appropriately. Cranial nerves grossly intact. Skin: Wound VAC on right medial leg noted.  Psych: Normal affect.   LAB RESULTS:  Lab Results  Component Value Date   NA 136 04/25/2017   K 3.3 (L) 04/25/2017   CL 99 (L) 04/25/2017   CO2 27 04/25/2017   GLUCOSE 168 (H) 04/25/2017   BUN 23 (H) 04/25/2017   CREATININE 0.56 04/25/2017   CALCIUM 9.3 04/25/2017   PROT 7.3 04/25/2017   ALBUMIN 4.3 04/25/2017   AST 20 04/25/2017   ALT 14 04/25/2017   ALKPHOS 60 04/25/2017   BILITOT 0.5 04/25/2017   GFRNONAA >60 04/25/2017   GFRAA >60 04/25/2017    Lab Results  Component Value Date   WBC 8.4 04/25/2017   NEUTROABS 5.3 04/25/2017   HGB 10.4 (L) 04/25/2017   HCT 31.1 (L) 04/25/2017   MCV 84.3 04/25/2017   PLT 336 04/25/2017   Lab Results  Component Value Date   IRON 71 01/09/2017   TIBC 225 (L) 01/09/2017   IRONPCTSAT 32 (H) 01/09/2017   Lab Results  Component Value Date   FERRITIN 58 01/09/2017    STUDIES: No results found.  ASSESSMENT: Iron deficiency anemia, locally advanced basal cell carcinoma of the right leg.  PLAN:    1. Iron deficiency anemia: Patient's hemoglobin has decreased, but patient likely had blood loss  during her surgery. She has had no active bleeding. No intervention is needed at this time patient does not require blood transfusion or IV iron today. 2. Basal cell carcinoma of the right leg: Patient states this lesion has been growing for 9 years and she did not seek medical attention. Diagnosis confirmed by biopsy from surgery. Previously, CT scan results reviewed independently and reported no obvious metastatic disease. Patient underwent embolization. She has had significant improvement of the size of her tumor with a combination of a hedgehog inhibitor, vismodegib (Erivedge) 150 mg daily and the embolization. Patient has now completed resection of her residual malignancy with continued follow-up for surgery. Patient will also require additional plexus surgery for wound healing. It is unclear if she will need adjuvant treatment.  Return to clinic in 3 months for further evaluation and postop evaluation.  3. Hypertension: Patient's blood pressure remains significantly elevated. She does not appear to be on hypertensive medications. Patient states her blood pressure is typically normal and attributes it to "white coat syndrome". Monitor. 4. Fatigue: Improved, monitor. 5. Nausea: Continue current symptomatic treatment. 6. Muscle cramping: Patient does not complain of this today.  Approximately 30 minutes was spent in discussion of which greater than 50% was consultation.  Patient expressed understanding and was in agreement with this plan. She also understands that She can call clinic at any time with any questions, concerns, or complaints.   Cancer Staging Basal cell carcinoma, leg, right Staging form: Melanoma of the Skin, AJCC 7th Edition - Clinical: No stage assigned - Unsigned   Lloyd Huger, MD   04/28/2017 8:50 AM

## 2017-04-25 ENCOUNTER — Inpatient Hospital Stay: Payer: Medicare Other

## 2017-04-25 ENCOUNTER — Inpatient Hospital Stay: Payer: Medicare Other | Attending: Oncology | Admitting: Oncology

## 2017-04-25 VITALS — BP 176/90 | HR 98 | Temp 97.6°F | Resp 18 | Wt 101.7 lb

## 2017-04-25 DIAGNOSIS — I10 Essential (primary) hypertension: Secondary | ICD-10-CM | POA: Diagnosis not present

## 2017-04-25 DIAGNOSIS — C44712 Basal cell carcinoma of skin of right lower limb, including hip: Secondary | ICD-10-CM | POA: Insufficient documentation

## 2017-04-25 DIAGNOSIS — R5383 Other fatigue: Secondary | ICD-10-CM | POA: Diagnosis not present

## 2017-04-25 DIAGNOSIS — D509 Iron deficiency anemia, unspecified: Secondary | ICD-10-CM

## 2017-04-25 DIAGNOSIS — R110 Nausea: Secondary | ICD-10-CM

## 2017-04-25 DIAGNOSIS — Z79899 Other long term (current) drug therapy: Secondary | ICD-10-CM

## 2017-04-25 LAB — COMPREHENSIVE METABOLIC PANEL
ALBUMIN: 4.3 g/dL (ref 3.5–5.0)
ALK PHOS: 60 U/L (ref 38–126)
ALT: 14 U/L (ref 14–54)
ANION GAP: 10 (ref 5–15)
AST: 20 U/L (ref 15–41)
BILIRUBIN TOTAL: 0.5 mg/dL (ref 0.3–1.2)
BUN: 23 mg/dL — AB (ref 6–20)
CALCIUM: 9.3 mg/dL (ref 8.9–10.3)
CO2: 27 mmol/L (ref 22–32)
CREATININE: 0.56 mg/dL (ref 0.44–1.00)
Chloride: 99 mmol/L — ABNORMAL LOW (ref 101–111)
GFR calc Af Amer: >60 mL/min (ref >60)
GFR calc non Af Amer: >60 mL/min (ref >60)
GLUCOSE: 168 mg/dL — AB (ref 65–99)
Potassium: 3.3 mmol/L — ABNORMAL LOW (ref 3.5–5.1)
SODIUM: 136 mmol/L (ref 135–145)
TOTAL PROTEIN: 7.3 g/dL (ref 6.5–8.1)

## 2017-04-25 LAB — CBC WITH DIFFERENTIAL/PLATELET
BASOS PCT: 1 %
Basophils Absolute: 0.1 10*3/uL (ref 0–0.1)
EOS ABS: 0.3 10*3/uL (ref 0–0.7)
Eosinophils Relative: 4 %
HEMATOCRIT: 31.1 % — AB (ref 35.0–47.0)
HEMOGLOBIN: 10.4 g/dL — AB (ref 12.0–16.0)
LYMPHS ABS: 1.9 10*3/uL (ref 1.0–3.6)
LYMPHS PCT: 22 %
MCH: 28.3 pg (ref 26.0–34.0)
MCHC: 33.5 g/dL (ref 32.0–36.0)
MCV: 84.3 fL (ref 80.0–100.0)
MONOS PCT: 9 %
Monocytes Absolute: 0.8 10*3/uL (ref 0.2–0.9)
NEUTROS ABS: 5.3 10*3/uL (ref 1.4–6.5)
Neutrophils Relative %: 64 %
Platelets: 336 10*3/uL (ref 150–440)
RBC: 3.69 MIL/uL — AB (ref 3.80–5.20)
RDW: 15.9 % — ABNORMAL HIGH (ref 11.5–14.5)
WBC: 8.4 10*3/uL (ref 3.6–11.0)

## 2017-04-25 LAB — MAGNESIUM: Magnesium: 2 mg/dL (ref 1.7–2.4)

## 2017-04-27 DIAGNOSIS — D649 Anemia, unspecified: Secondary | ICD-10-CM | POA: Diagnosis not present

## 2017-04-27 DIAGNOSIS — Z483 Aftercare following surgery for neoplasm: Secondary | ICD-10-CM | POA: Diagnosis not present

## 2017-04-27 DIAGNOSIS — E43 Unspecified severe protein-calorie malnutrition: Secondary | ICD-10-CM | POA: Diagnosis not present

## 2017-04-27 DIAGNOSIS — C44712 Basal cell carcinoma of skin of right lower limb, including hip: Secondary | ICD-10-CM | POA: Diagnosis not present

## 2017-05-01 DIAGNOSIS — T8189XA Other complications of procedures, not elsewhere classified, initial encounter: Secondary | ICD-10-CM | POA: Diagnosis not present

## 2017-05-26 DIAGNOSIS — C44712 Basal cell carcinoma of skin of right lower limb, including hip: Secondary | ICD-10-CM | POA: Diagnosis not present

## 2017-05-26 DIAGNOSIS — E43 Unspecified severe protein-calorie malnutrition: Secondary | ICD-10-CM | POA: Diagnosis not present

## 2017-05-26 DIAGNOSIS — S71101A Unspecified open wound, right thigh, initial encounter: Secondary | ICD-10-CM | POA: Diagnosis not present

## 2017-05-26 DIAGNOSIS — D649 Anemia, unspecified: Secondary | ICD-10-CM | POA: Diagnosis not present

## 2017-05-26 DIAGNOSIS — Z483 Aftercare following surgery for neoplasm: Secondary | ICD-10-CM | POA: Diagnosis not present

## 2017-05-26 DIAGNOSIS — Z4801 Encounter for change or removal of surgical wound dressing: Secondary | ICD-10-CM | POA: Diagnosis not present

## 2017-05-31 ENCOUNTER — Other Ambulatory Visit: Payer: Medicare Other

## 2017-05-31 DIAGNOSIS — T8189XA Other complications of procedures, not elsewhere classified, initial encounter: Secondary | ICD-10-CM | POA: Diagnosis not present

## 2017-06-02 DIAGNOSIS — Z483 Aftercare following surgery for neoplasm: Secondary | ICD-10-CM | POA: Diagnosis not present

## 2017-06-02 DIAGNOSIS — C44712 Basal cell carcinoma of skin of right lower limb, including hip: Secondary | ICD-10-CM | POA: Diagnosis not present

## 2017-06-02 DIAGNOSIS — D649 Anemia, unspecified: Secondary | ICD-10-CM | POA: Diagnosis not present

## 2017-06-02 DIAGNOSIS — E43 Unspecified severe protein-calorie malnutrition: Secondary | ICD-10-CM | POA: Diagnosis not present

## 2017-06-08 DIAGNOSIS — Z483 Aftercare following surgery for neoplasm: Secondary | ICD-10-CM | POA: Diagnosis not present

## 2017-06-08 DIAGNOSIS — C44712 Basal cell carcinoma of skin of right lower limb, including hip: Secondary | ICD-10-CM | POA: Diagnosis not present

## 2017-06-08 DIAGNOSIS — E43 Unspecified severe protein-calorie malnutrition: Secondary | ICD-10-CM | POA: Diagnosis not present

## 2017-06-08 DIAGNOSIS — D649 Anemia, unspecified: Secondary | ICD-10-CM | POA: Diagnosis not present

## 2017-06-16 DIAGNOSIS — C44712 Basal cell carcinoma of skin of right lower limb, including hip: Secondary | ICD-10-CM | POA: Diagnosis not present

## 2017-06-16 DIAGNOSIS — I719 Aortic aneurysm of unspecified site, without rupture: Secondary | ICD-10-CM | POA: Diagnosis not present

## 2017-06-16 DIAGNOSIS — Z7951 Long term (current) use of inhaled steroids: Secondary | ICD-10-CM | POA: Diagnosis not present

## 2017-06-16 DIAGNOSIS — D649 Anemia, unspecified: Secondary | ICD-10-CM | POA: Diagnosis not present

## 2017-06-16 DIAGNOSIS — Z79891 Long term (current) use of opiate analgesic: Secondary | ICD-10-CM | POA: Diagnosis not present

## 2017-06-16 DIAGNOSIS — E43 Unspecified severe protein-calorie malnutrition: Secondary | ICD-10-CM | POA: Diagnosis not present

## 2017-06-16 DIAGNOSIS — Z483 Aftercare following surgery for neoplasm: Secondary | ICD-10-CM | POA: Diagnosis not present

## 2017-06-16 DIAGNOSIS — Z87891 Personal history of nicotine dependence: Secondary | ICD-10-CM | POA: Diagnosis not present

## 2017-06-23 DIAGNOSIS — E43 Unspecified severe protein-calorie malnutrition: Secondary | ICD-10-CM | POA: Diagnosis not present

## 2017-06-23 DIAGNOSIS — Z7951 Long term (current) use of inhaled steroids: Secondary | ICD-10-CM | POA: Diagnosis not present

## 2017-06-23 DIAGNOSIS — Z79891 Long term (current) use of opiate analgesic: Secondary | ICD-10-CM | POA: Diagnosis not present

## 2017-06-23 DIAGNOSIS — Z483 Aftercare following surgery for neoplasm: Secondary | ICD-10-CM | POA: Diagnosis not present

## 2017-06-23 DIAGNOSIS — I719 Aortic aneurysm of unspecified site, without rupture: Secondary | ICD-10-CM | POA: Diagnosis not present

## 2017-06-23 DIAGNOSIS — D649 Anemia, unspecified: Secondary | ICD-10-CM | POA: Diagnosis not present

## 2017-06-23 DIAGNOSIS — Z87891 Personal history of nicotine dependence: Secondary | ICD-10-CM | POA: Diagnosis not present

## 2017-06-23 DIAGNOSIS — C44712 Basal cell carcinoma of skin of right lower limb, including hip: Secondary | ICD-10-CM | POA: Diagnosis not present

## 2017-06-30 DIAGNOSIS — C44712 Basal cell carcinoma of skin of right lower limb, including hip: Secondary | ICD-10-CM | POA: Diagnosis not present

## 2017-07-04 DIAGNOSIS — E43 Unspecified severe protein-calorie malnutrition: Secondary | ICD-10-CM | POA: Diagnosis not present

## 2017-07-04 DIAGNOSIS — D649 Anemia, unspecified: Secondary | ICD-10-CM | POA: Diagnosis not present

## 2017-07-04 DIAGNOSIS — I719 Aortic aneurysm of unspecified site, without rupture: Secondary | ICD-10-CM | POA: Diagnosis not present

## 2017-07-04 DIAGNOSIS — C44712 Basal cell carcinoma of skin of right lower limb, including hip: Secondary | ICD-10-CM | POA: Diagnosis not present

## 2017-07-04 DIAGNOSIS — Z87891 Personal history of nicotine dependence: Secondary | ICD-10-CM | POA: Diagnosis not present

## 2017-07-04 DIAGNOSIS — Z79891 Long term (current) use of opiate analgesic: Secondary | ICD-10-CM | POA: Diagnosis not present

## 2017-07-04 DIAGNOSIS — Z7951 Long term (current) use of inhaled steroids: Secondary | ICD-10-CM | POA: Diagnosis not present

## 2017-07-04 DIAGNOSIS — Z483 Aftercare following surgery for neoplasm: Secondary | ICD-10-CM | POA: Diagnosis not present

## 2017-07-13 ENCOUNTER — Other Ambulatory Visit: Payer: Self-pay | Admitting: Family Medicine

## 2017-07-13 DIAGNOSIS — J4540 Moderate persistent asthma, uncomplicated: Secondary | ICD-10-CM

## 2017-07-24 DIAGNOSIS — C44712 Basal cell carcinoma of skin of right lower limb, including hip: Secondary | ICD-10-CM | POA: Diagnosis not present

## 2017-07-30 NOTE — Progress Notes (Signed)
[place]  Telephone:(336) [phone] Fax:(336) [phone]  ID: Patricia Galloway OB: 1951-09-02  MR#: [id number]  JSE#:[id number]  Patient Care Team: [name], MD as PCP - General (Family Medicine) [name], MD as Attending Physician (Family Medicine) Robert Bellow, MD (General Surgery)  CHIEF COMPLAINT: Iron deficiency anemia, locally advanced basal cell carcinoma of the right leg.  INTERVAL HISTORY: Patient returns to clinic today for repeat laboratory work and further evaluation. She underwent complete resection of her residual malignancy in August 2018 and now is nearly completely healed. She currently feels well and back to her baseline. She has no neurologic complaints. She denies any recent fevers. She has no chest pain or shortness of breath. She denies any vomiting, constipation, or diarrhea. She has no urinary complaints. Patient offers no specific complaints today.  REVIEW OF SYSTEMS:   Review of Systems  Constitutional: Negative for fever, malaise/fatigue and weight loss.  Respiratory: Negative.  Negative for cough and shortness of breath.   Cardiovascular: Negative.  Negative for chest pain and leg swelling.  Gastrointestinal: Negative for abdominal pain, nausea and vomiting.  Genitourinary: Negative.   Musculoskeletal: Negative.   Skin: Negative.  Negative for rash.  Neurological: Negative for sensory change, focal weakness and weakness.  Psychiatric/Behavioral: Negative.  The patient is not nervous/anxious.     As per HPI. Otherwise, a complete review of systems is negative.  PAST MEDICAL HISTORY: Past Medical History:  Diagnosis Date  . Anemia    has had iron infusion  . Anxiety    complicated grief  . Aortic aneurysm ([place])    found on CT in December, 2017  . Asthma    as child , but no episodes in over 30 years  . Cancer ([place]) 08/2016   basal cell carcinoma - right leg  . History of transfusion of packed red blood cells      PAST SURGICAL HISTORY: Past Surgical History:  Procedure Laterality Date  . BASAL CELL CARCINOMA EXCISION Right 03/29/2017   Procedure: EXCISION OF RIGHT LEG BASEL CELL CARCINOMA;  Surgeon: [name], DO;  Location: [facility];  Service: Plastics;  Laterality: Right;  . COLON SURGERY  2002   colostomy for 10 months after a perfurated sigmoid   . COLOSTOMY REVERSAL  2003  . EMBOLIZATION Right 10/12/2016   Procedure: Embolization;  Surgeon: [name], MD;  Location: [facility];  Service: Cardiovascular;  Laterality: Right;  . etopic  0737,1062  . INCISION AND DRAINAGE OF WOUND Right 03/30/2017   Procedure: IRRIGATION AND DEBRIDEMENT WOUND; VAC PLACEMENT;  Surgeon: [name], DO;  Location: [facility];  Service: Plastics;  Laterality: Right;  . TONSILLECTOMY      FAMILY HISTORY: Family History  Problem Relation Age of Onset  . Dementia Mother   . Aortic stenosis Mother   . Osteoporosis Mother   . Dementia Father   . Diabetes Father   . Hyperlipidemia Father   . Hypertension Father   . Aneurysm Sister   . Hyperlipidemia Sister   . Hypertension Sister   . Diabetes Sister   . Breast cancer Neg Hx     ADVANCED DIRECTIVES (Y/N):  N  HEALTH MAINTENANCE: Social History   Tobacco Use  . Smoking status: Former Smoker    Packs/day: 0.50    Years: 25.00    Pack years: 12.50    Types: Cigarettes    Last attempt to quit: 08/03/1998    Years since quitting: 19.0  .  Smokeless tobacco: Never Used  Substance Use Topics  . Alcohol use: No    Alcohol/week: 0.0 oz  . Drug use: No     Colonoscopy:  PAP:  Bone density:  Lipid panel:  Allergies  Allergen Reactions  . Penicillins Anaphylaxis    Has patient had a PCN reaction causing immediate rash, facial/tongue/throat swelling, SOB or lightheadedness with hypotension: Yes Has patient had a PCN reaction causing severe rash involving mucus membranes or skin necrosis: No Has patient had a PCN  reaction that required hospitalization: No Has patient had a PCN reaction occurring within the last 10 years: No If all of the above answers are "NO", then may proceed with Cephalosporin use.   . Codeine Nausea And Vomiting  . Latex Itching  . Sulfur Other (See Comments)    Reaction:  Redness/made skin bleed   . Tape Other (See Comments)    Reaction:  Blisters and burning  Pt states that paper tape is okay.    [address] Hcl] Anxiety    Current Outpatient Medications  Medication Sig Dispense Refill  . acidophilus (RISAQUAD) CAPS capsule Take 3 capsules by mouth at bedtime.    Marland Kitchen albuterol (PROVENTIL HFA;VENTOLIN HFA) 108 (90 Base) MCG/ACT inhaler Inhale 2 puffs into the lungs every 6 (six) hours as needed for wheezing or shortness of breath. 1 Inhaler 1  . BREO ELLIPTA 100-25 MCG/INH AEPB INHALE 1 PUFF BY MOUTH INTO THE LUNGS DAILY 60 each 0  . cholecalciferol (VITAMIN D) 1000 units tablet Take 1,000 Units by mouth 2 (two) times daily.    . Coenzyme Q10 (COQ10) 100 MG CAPS Take 100 mg by mouth 2 (two) times daily.    . Digestive Enzyme CAPS Take 1 capsule by mouth 3 (three) times daily after meals.     . Ginkgo Biloba Extract (GNP GINGKO BILOBA EXTRACT) 60 MG CAPS Take 60 mg by mouth daily.     . magnesium 30 MG tablet Take 60 mg by mouth daily.     . Multiple Vitamin (MULTIVITAMIN) capsule Take 2 capsules by mouth daily.     Marland Kitchen OVER THE COUNTER MEDICATION Take 1 tablet by mouth 2 (two) times daily. Mushroom Formula - Stamets 7    . OVER THE COUNTER MEDICATION Take 1 tablet by mouth 2 (two) times daily. Nutri-Calm    . Spirulina 500 MG TABS Take 500 mg by mouth daily.     Marland Kitchen UNABLE TO FIND Med Name: Alinda Dooms    . UNABLE TO FIND Med Name: liver care    . diazepam (VALIUM) 5 MG tablet Take 1 tablet (5 mg total) by mouth every 12 (twelve) hours as needed for anxiety. (Patient not taking: Reported on 04/25/2017) 30 tablet 0  . oxyCODONE-acetaminophen (ROXICET) 5-325 MG tablet  Take 1 tablet by mouth every 6 (six) hours as needed for moderate pain or severe pain. (Patient not taking: Reported on 04/25/2017) 20 tablet 0   No current facility-administered medications for this visit.     OBJECTIVE: Vitals:   08/01/17 1432  BP: (!) 209/106  Pulse: 88  Resp: 18  Temp: (!) 96.7 F (35.9 C)     Body mass index is 20.44 kg/m.    ECOG FS:0 - Asymptomatic  General: Well-developed, well-nourished, no acute distress. Eyes: Pink conjunctiva, anicteric sclera. Lungs: Clear to auscultation bilaterally. Heart: Regular rate and rhythm. No rubs, murmurs, or gallops. Abdomen: Soft, nontender, nondistended. No organomegaly noted, normoactive bowel sounds. Musculoskeletal: No edema, cyanosis, or clubbing.  Neuro: Alert, answering all questions appropriately. Cranial nerves grossly intact. Skin: Wound VAC on right medial leg noted. Psych: Normal affect.   LAB RESULTS:  Lab Results  Component Value Date   NA 137 08/01/2017   K 3.6 08/01/2017   CL 100 (L) 08/01/2017   CO2 26 08/01/2017   GLUCOSE 104 (H) 08/01/2017   BUN 16 08/01/2017   CREATININE 0.85 08/01/2017   CALCIUM 9.4 08/01/2017   PROT 7.6 08/01/2017   ALBUMIN 4.5 08/01/2017   AST 23 08/01/2017   ALT 17 08/01/2017   ALKPHOS 68 08/01/2017   BILITOT 0.5 08/01/2017   GFRNONAA >60 08/01/2017   GFRAA >60 08/01/2017    Lab Results  Component Value Date   WBC 8.5 08/01/2017   NEUTROABS 4.8 08/01/2017   HGB 12.3 08/01/2017   HCT 37.2 08/01/2017   MCV 77.5 (L) 08/01/2017   PLT 265 08/01/2017   Lab Results  Component Value Date   IRON 71 01/09/2017   TIBC 225 (L) 01/09/2017   IRONPCTSAT 32 (H) 01/09/2017   Lab Results  Component Value Date   FERRITIN 58 01/09/2017    STUDIES: Dg Bone Density  Result Date: 08/02/2017 EXAM: DUAL X-RAY ABSORPTIOMETRY (DXA) FOR BONE MINERAL DENSITY IMPRESSION: Dear Dr Ancil Boozer, Your patient Patricia Galloway completed a BMD test on 08/02/2017 using the [place] (analysis version: 14.10) manufactured by [employer]. The following summarizes the results of our evaluation. PATIENT BIOGRAPHICAL: Name: Patricia, Galloway Patient ID: [id number] Birth Date: 1950-10-01 Height: 63.0 in. Gender: Female Exam Date: 08/02/2017 Weight: 113.0 lbs. Indications: Asthma, Caucasian, Family Hx of Osteoporosis, Parent Hip Fracture, Postmenopausal, Previous Chemo and Radiation Fractures: Treatments: Albuterol ASSESSMENT: The BMD measured at Femur Neck Left is 0.518 g/cm2 with a T-score of -3.7. This patient is considered osteoporotic according to [facility]) criteria. L-4 was excluded due to degenerative changes. Site Region Measured Measured WHO Young Adult BMD Date       Age      Classification T-score AP Spine L1-L3 08/02/2017 66.0 Osteoporosis -3.6 0.744 g/cm2 DualFemur Neck Left 08/02/2017 66.0 Osteoporosis -3.7 0.518 g/cm2 [facility]) criteria for post-menopausal, Caucasian Women: Normal:       T-score at or above -1 SD Osteopenia:   T-score between -1 and -2.5 SD Osteoporosis: T-score at or below -2.5 SD RECOMMENDATIONS: [place] recommends that FDA-approved medical therapies be considered in postmenopausal women and men age 27 or older with a: 1. Hip or vertebral (clinical or morphometric) fracture. 2. T-score of < -2.5 at the spine or hip. 3. Ten-year fracture probability by FRAX of 3% or greater for hip fracture or 20% or greater for major osteoporotic fracture. All treatment decisions require clinical judgment and consideration of individual patient factors, including patient preferences, co-morbidities, previous drug use, risk factors not captured in the FRAX model (e.g. falls, vitamin D deficiency, increased bone turnover, interval significant decline in bone density) and possible under - or over-estimation of fracture risk by FRAX. All patients should ensure an adequate intake of dietary calcium (1200 mg/d) and  vitamin D (800 IU daily) unless contraindicated. FOLLOW-UP: People with diagnosed cases of osteoporosis or at high risk for fracture should have regular bone mineral density tests. For patients eligible for Medicare, routine testing is allowed once every 2 years. The testing frequency can be increased to one year for patients who have rapidly progressing disease, those who are receiving or discontinuing medical therapy to restore bone mass, or have additional  risk factors. I have reviewed this report, and agree with the above findings. [facility] Radiology Electronically Signed   By: Marijo Conception, M.D.   On: 08/02/2017 14:39   Mm Digital Screening  Result Date: 08/02/2017 CLINICAL DATA:  Screening. EXAM: DIGITAL SCREENING BILATERAL MAMMOGRAM WITH CAD COMPARISON:  None. ACR Breast Density Category b: There are scattered areas of fibroglandular density. FINDINGS: There are no findings suspicious for malignancy. Images were processed with CAD. IMPRESSION: No mammographic evidence of malignancy. A result letter of this screening mammogram will be mailed directly to the patient. RECOMMENDATION: Screening mammogram in one year. (Code:SM-B-01Y) BI-RADS CATEGORY  1: Negative. Electronically Signed   By: Lajean Manes M.D.   On: 08/02/2017 14:26    ASSESSMENT: Iron deficiency anemia, locally advanced basal cell carcinoma of the right leg.  PLAN:    1. Iron deficiency anemia: Patient's hemoglobin is now within normal limits. No intervention is needed at this time patient does not require blood transfusion or IV iron today. 2. Basal cell carcinoma of the right leg: Patient states this lesion grew for 9 years and she did not seek medical attention. Diagnosis confirmed by biopsy from surgery. Previously, CT scan results reviewed independently and reported no obvious metastatic disease. Patient underwent embolization. She has had significant improvement of the size of her tumor with a combination of a hedgehog  inhibitor, vismodegib (Erivedge) 150 mg daily and the embolization. Patient has now completed resection of her residual malignancy with clear margins and no obvious evidence of disease.  No further interventions are needed at this time.  Briefly discussed adjuvant chemotherapy, but determined this was not necessary.  Patient also states she would refuse any additional treatment.  Return to clinic in 4 months for further evaluation and postop evaluation.  3. Hypertension: Patient's blood pressure remains significantly elevated. She does not appear to be on hypertensive medications. Patient states her blood pressure is typically normal and attributes it to "white coat syndrome".  Will defer to primary care for further treatment and evaluation. 4.  Pulmonary nodule: CT scan from August 25, 2016 revealed an 8 mm left upper lobe pulmonary nodule.  Consider repeat CT scan in the near future.  Approximately 30 minutes was spent in discussion of which greater than 50% was consultation.  Patient expressed understanding and was in agreement with this plan. She also understands that She can call clinic at any time with any questions, concerns, or complaints.   Cancer Staging Basal cell carcinoma, leg, right Staging form: Melanoma of the Skin, AJCC 7th Edition - Clinical: No stage assigned - Unsigned   Lloyd Huger, MD   08/04/2017 8:33 AM

## 2017-07-31 ENCOUNTER — Other Ambulatory Visit: Payer: Self-pay | Admitting: *Deleted

## 2017-07-31 DIAGNOSIS — C4491 Basal cell carcinoma of skin, unspecified: Secondary | ICD-10-CM

## 2017-08-01 ENCOUNTER — Inpatient Hospital Stay: Payer: Medicare Other | Attending: Oncology

## 2017-08-01 ENCOUNTER — Inpatient Hospital Stay (HOSPITAL_BASED_OUTPATIENT_CLINIC_OR_DEPARTMENT_OTHER): Payer: Medicare Other | Admitting: Oncology

## 2017-08-01 ENCOUNTER — Other Ambulatory Visit: Payer: Self-pay

## 2017-08-01 ENCOUNTER — Encounter: Payer: Self-pay | Admitting: Oncology

## 2017-08-01 VITALS — BP 209/106 | HR 88 | Temp 96.7°F | Resp 18 | Wt 115.4 lb

## 2017-08-01 DIAGNOSIS — I10 Essential (primary) hypertension: Secondary | ICD-10-CM | POA: Diagnosis not present

## 2017-08-01 DIAGNOSIS — Z79899 Other long term (current) drug therapy: Secondary | ICD-10-CM | POA: Diagnosis not present

## 2017-08-01 DIAGNOSIS — F419 Anxiety disorder, unspecified: Secondary | ICD-10-CM | POA: Diagnosis not present

## 2017-08-01 DIAGNOSIS — R911 Solitary pulmonary nodule: Secondary | ICD-10-CM | POA: Diagnosis not present

## 2017-08-01 DIAGNOSIS — Z87891 Personal history of nicotine dependence: Secondary | ICD-10-CM | POA: Insufficient documentation

## 2017-08-01 DIAGNOSIS — C44712 Basal cell carcinoma of skin of right lower limb, including hip: Secondary | ICD-10-CM | POA: Insufficient documentation

## 2017-08-01 DIAGNOSIS — C4491 Basal cell carcinoma of skin, unspecified: Secondary | ICD-10-CM

## 2017-08-01 DIAGNOSIS — J45909 Unspecified asthma, uncomplicated: Secondary | ICD-10-CM | POA: Diagnosis not present

## 2017-08-01 DIAGNOSIS — D500 Iron deficiency anemia secondary to blood loss (chronic): Secondary | ICD-10-CM

## 2017-08-01 DIAGNOSIS — D509 Iron deficiency anemia, unspecified: Secondary | ICD-10-CM | POA: Diagnosis not present

## 2017-08-01 LAB — COMPREHENSIVE METABOLIC PANEL
ALT: 17 U/L (ref 14–54)
ANION GAP: 11 (ref 5–15)
AST: 23 U/L (ref 15–41)
Albumin: 4.5 g/dL (ref 3.5–5.0)
Alkaline Phosphatase: 68 U/L (ref 38–126)
BUN: 16 mg/dL (ref 6–20)
CHLORIDE: 100 mmol/L — AB (ref 101–111)
CO2: 26 mmol/L (ref 22–32)
Calcium: 9.4 mg/dL (ref 8.9–10.3)
Creatinine, Ser: 0.85 mg/dL (ref 0.44–1.00)
Glucose, Bld: 104 mg/dL — ABNORMAL HIGH (ref 65–99)
POTASSIUM: 3.6 mmol/L (ref 3.5–5.1)
SODIUM: 137 mmol/L (ref 135–145)
Total Bilirubin: 0.5 mg/dL (ref 0.3–1.2)
Total Protein: 7.6 g/dL (ref 6.5–8.1)

## 2017-08-01 LAB — CBC WITH DIFFERENTIAL/PLATELET
Basophils Absolute: 0.1 10*3/uL (ref 0–0.1)
Basophils Relative: 2 %
EOS ABS: 0.3 10*3/uL (ref 0–0.7)
Eosinophils Relative: 3 %
HEMATOCRIT: 37.2 % (ref 35.0–47.0)
HEMOGLOBIN: 12.3 g/dL (ref 12.0–16.0)
LYMPHS ABS: 2.4 10*3/uL (ref 1.0–3.6)
LYMPHS PCT: 29 %
MCH: 25.6 pg — AB (ref 26.0–34.0)
MCHC: 33 g/dL (ref 32.0–36.0)
MCV: 77.5 fL — AB (ref 80.0–100.0)
MONOS PCT: 11 %
Monocytes Absolute: 0.9 10*3/uL (ref 0.2–0.9)
NEUTROS ABS: 4.8 10*3/uL (ref 1.4–6.5)
NEUTROS PCT: 55 %
PLATELETS: 265 10*3/uL (ref 150–440)
RBC: 4.8 MIL/uL (ref 3.80–5.20)
RDW: 23.5 % — ABNORMAL HIGH (ref 11.5–14.5)
WBC: 8.5 10*3/uL (ref 3.6–11.0)

## 2017-08-01 LAB — MAGNESIUM: MAGNESIUM: 2.2 mg/dL (ref 1.7–2.4)

## 2017-08-01 NOTE — Progress Notes (Signed)
Patient denies any concerns today.  

## 2017-08-02 ENCOUNTER — Ambulatory Visit
Admission: RE | Admit: 2017-08-02 | Discharge: 2017-08-02 | Disposition: A | Discharge status: Home / Self Care | Payer: Medicare Other | Source: Ambulatory Visit | Attending: Family Medicine | Admitting: Family Medicine

## 2017-08-02 DIAGNOSIS — Z780 Asymptomatic menopausal state: Secondary | ICD-10-CM | POA: Diagnosis not present

## 2017-08-02 DIAGNOSIS — Z1231 Encounter for screening mammogram for malignant neoplasm of breast: Secondary | ICD-10-CM | POA: Diagnosis not present

## 2017-08-02 DIAGNOSIS — Z0001 Encounter for general adult medical examination with abnormal findings: Secondary | ICD-10-CM | POA: Diagnosis not present

## 2017-08-02 DIAGNOSIS — M810 Age-related osteoporosis without current pathological fracture: Secondary | ICD-10-CM | POA: Insufficient documentation

## 2017-08-02 DIAGNOSIS — Z0000 Encounter for general adult medical examination without abnormal findings: Secondary | ICD-10-CM

## 2017-08-15 ENCOUNTER — Ambulatory Visit: Payer: Medicare Other | Admitting: Family Medicine

## 2017-08-21 DIAGNOSIS — C44712 Basal cell carcinoma of skin of right lower limb, including hip: Secondary | ICD-10-CM | POA: Diagnosis not present

## 2017-09-22 ENCOUNTER — Other Ambulatory Visit: Payer: Self-pay | Admitting: Family Medicine

## 2017-09-22 DIAGNOSIS — J4540 Moderate persistent asthma, uncomplicated: Secondary | ICD-10-CM

## 2017-09-22 NOTE — Telephone Encounter (Signed)
Refill request for general medication: Breo Ellipta 100-25 mcg  Last office visit: 12/26/2016  Last physical exam: 10/31/2016  Follow-up on file. None indicated

## 2017-10-24 DIAGNOSIS — C44712 Basal cell carcinoma of skin of right lower limb, including hip: Secondary | ICD-10-CM | POA: Diagnosis not present

## 2017-10-26 ENCOUNTER — Encounter: Payer: Self-pay | Admitting: Family Medicine

## 2017-10-30 ENCOUNTER — Other Ambulatory Visit: Payer: Self-pay

## 2017-10-30 DIAGNOSIS — J4540 Moderate persistent asthma, uncomplicated: Secondary | ICD-10-CM

## 2017-10-30 MED ORDER — FLUTICASONE FUROATE-VILANTEROL 100-25 MCG/INH IN AEPB: INHALATION_SPRAY | RESPIRATORY_TRACT | 0 refills | Status: DC

## 2017-10-30 NOTE — Telephone Encounter (Signed)
Refill request for general medication: Patricia Galloway  Last office visit: 12/26/2016  Last physical exam: 10/31/2016  Follow-up on file. 12/26/2017

## 2017-10-31 ENCOUNTER — Other Ambulatory Visit: Payer: Self-pay | Admitting: Family Medicine

## 2017-10-31 DIAGNOSIS — J4540 Moderate persistent asthma, uncomplicated: Secondary | ICD-10-CM

## 2017-10-31 NOTE — Telephone Encounter (Signed)
Refill request for general medication: Bre[facility] to [facility]   Last office visit: 12/26/2016  Follow up on 12/26/2017

## 2017-11-26 NOTE — Progress Notes (Deleted)
[place]  [phone] Fax:(336) [phone]  ID: Jacqlyn Krauss OB: 02-12-1951  MR#: [id number]  XLK#:[id number]  Patient Care Team: [name], MD as PCP - General (Family Medicine) [name], MD as Attending Physician (Family Medicine) Robert Bellow, MD (General Surgery)  CHIEF COMPLAINT: Iron deficiency anemia, locally advanced basal cell carcinoma of the right leg.  INTERVAL HISTORY: Patient returns to clinic today for repeat laboratory work and further evaluation. She underwent complete resection of her residual malignancy in August 2018 and now is nearly completely healed. She currently feels well and back to her baseline. She has no neurologic complaints. She denies any recent fevers. She has no chest pain or shortness of breath. She denies any vomiting, constipation, or diarrhea. She has no urinary complaints. Patient offers no specific complaints today.  REVIEW OF SYSTEMS:   Review of Systems  Constitutional: Negative for fever, malaise/fatigue and weight loss.  Respiratory: Negative.  Negative for cough and shortness of breath.   Cardiovascular: Negative.  Negative for chest pain and leg swelling.  Gastrointestinal: Negative for abdominal pain, nausea and vomiting.  Genitourinary: Negative.   Musculoskeletal: Negative.   Skin: Negative.  Negative for rash.  Neurological: Negative for sensory change, focal weakness and weakness.  Psychiatric/Behavioral: Negative.  The patient is not nervous/anxious.     As per HPI. Otherwise, a complete review of systems is negative.  PAST MEDICAL HISTORY: Past Medical History:  Diagnosis Date  . Anemia    has had iron infusion  . Anxiety    complicated grief  . Aortic aneurysm ([place])    found on CT in December, 2017  . Asthma    as child , but no episodes in over 30 years  . Cancer ([place]) 08/2016   basal cell carcinoma - right leg  . History of transfusion of packed red blood cells      PAST SURGICAL HISTORY: Past Surgical History:  Procedure Laterality Date  . BASAL CELL CARCINOMA EXCISION Right 03/29/2017   Procedure: EXCISION OF RIGHT LEG BASEL CELL CARCINOMA;  Surgeon: [name], DO;  Location: [facility];  Service: Plastics;  Laterality: Right;  . COLON SURGERY  2002   colostomy for 10 months after a perfurated sigmoid   . COLOSTOMY REVERSAL  2003  . EMBOLIZATION Right 10/12/2016   Procedure: Embolization;  Surgeon: [name], MD;  Location: [facility];  Service: Cardiovascular;  Laterality: Right;  . etopic  3664,4034  . INCISION AND DRAINAGE OF WOUND Right 03/30/2017   Procedure: IRRIGATION AND DEBRIDEMENT WOUND; VAC PLACEMENT;  Surgeon: [name], DO;  [facility];  Service: Plastics;  Laterality: Right;  . TONSILLECTOMY      FAMILY HISTORY: Family History  Problem Relation Age of Onset  . Dementia Mother   . Aortic stenosis Mother   . Osteoporosis Mother   . Dementia Father   . Diabetes Father   . Hyperlipidemia Father   . Hypertension Father   . Aneurysm Sister   . Hyperlipidemia Sister   . Hypertension Sister   . Diabetes Sister   . Breast cancer Neg Hx     ADVANCED DIRECTIVES (Y/N):  N  HEALTH MAINTENANCE: Social History   Tobacco Use  . Smoking status: Former Smoker    Packs/day: 0.50    Years: 25.00    Pack years: 12.50    Types: Cigarettes    Last attempt to quit: 08/03/1998    Years since quitting: 19.3  .  Smokeless tobacco: Never Used  Substance Use Topics  . Alcohol use: No    Alcohol/week: 0.0 oz  . Drug use: No     Colonoscopy:  PAP:  Bone density:  Lipid panel:  Allergies  Allergen Reactions  . Penicillins Anaphylaxis    Has patient had a PCN reaction causing immediate rash, facial/tongue/throat swelling, SOB or lightheadedness with hypotension: Yes Has patient had a PCN reaction causing severe rash involving mucus membranes or skin necrosis: No Has patient had a PCN  reaction that required hospitalization: No Has patient had a PCN reaction occurring within the last 10 years: No If all of the above answers are "NO", then may proceed with Cephalosporin use.   . Codeine Nausea And Vomiting  . Latex Itching  . Sulfur Other (See Comments)    Reaction:  Redness/made skin bleed   . Tape Other (See Comments)    Reaction:  Blisters and burning  Pt states that paper tape is okay.    [address] Hcl] Anxiety    Current Outpatient Medications  Medication Sig Dispense Refill  . acidophilus (RISAQUAD) CAPS capsule Take 3 capsules by mouth at bedtime.    Marland Kitchen albuterol (PROVENTIL HFA;VENTOLIN HFA) 108 (90 Base) MCG/ACT inhaler Inhale 2 puffs into the lungs every 6 (six) hours as needed for wheezing or shortness of breath. 1 Inhaler 1  . BREO ELLIPTA 100-25 MCG/INH AEPB INHALE 1 PUFF BY MOUTH INTO THE LUNGS DAILY 60 each 0  . cholecalciferol (VITAMIN D) 1000 units tablet Take 1,000 Units by mouth 2 (two) times daily.    . Coenzyme Q10 (COQ10) 100 MG CAPS Take 100 mg by mouth 2 (two) times daily.    . diazepam (VALIUM) 5 MG tablet Take 1 tablet (5 mg total) by mouth every 12 (twelve) hours as needed for anxiety. (Patient not taking: Reported on 04/25/2017) 30 tablet 0  . Digestive Enzyme CAPS Take 1 capsule by mouth 3 (three) times daily after meals.     . Ginkgo Biloba Extract (GNP GINGKO BILOBA EXTRACT) 60 MG CAPS Take 60 mg by mouth daily.     . magnesium 30 MG tablet Take 60 mg by mouth daily.     . Multiple Vitamin (MULTIVITAMIN) capsule Take 2 capsules by mouth daily.     Marland Kitchen OVER THE COUNTER MEDICATION Take 1 tablet by mouth 2 (two) times daily. Mushroom Formula - Stamets 7    . OVER THE COUNTER MEDICATION Take 1 tablet by mouth 2 (two) times daily. Nutri-Calm    . oxyCODONE-acetaminophen (ROXICET) 5-325 MG tablet Take 1 tablet by mouth every 6 (six) hours as needed for moderate pain or severe pain. (Patient not taking: Reported on 04/25/2017) 20 tablet 0   . Spirulina 500 MG TABS Take 500 mg by mouth daily.     Marland Kitchen UNABLE TO FIND Med Name: Alinda Dooms    . UNABLE TO FIND Med Name: liver care     No current facility-administered medications for this visit.     OBJECTIVE: There were no vitals filed for this visit.   There is no height or weight on file to calculate BMI.    ECOG FS:0 - Asymptomatic  General: Well-developed, well-nourished, no acute distress. Eyes: Pink conjunctiva, anicteric sclera. Lungs: Clear to auscultation bilaterally. Heart: Regular rate and rhythm. No rubs, murmurs, or gallops. Abdomen: Soft, nontender, nondistended. No organomegaly noted, normoactive bowel sounds. Musculoskeletal: No edema, cyanosis, or clubbing. Neuro: Alert, answering all questions appropriately. Cranial nerves grossly intact. Skin:  Wound VAC on right medial leg noted. Psych: Normal affect.   LAB RESULTS:  Lab Results  Component Value Date   NA 137 08/01/2017   K 3.6 08/01/2017   CL 100 (L) 08/01/2017   CO2 26 08/01/2017   GLUCOSE 104 (H) 08/01/2017   BUN 16 08/01/2017   CREATININE 0.85 08/01/2017   CALCIUM 9.4 08/01/2017   PROT 7.6 08/01/2017   ALBUMIN 4.5 08/01/2017   AST 23 08/01/2017   ALT 17 08/01/2017   ALKPHOS 68 08/01/2017   BILITOT 0.5 08/01/2017   GFRNONAA >60 08/01/2017   GFRAA >60 08/01/2017    Lab Results  Component Value Date   WBC 8.5 08/01/2017   NEUTROABS 4.8 08/01/2017   HGB 12.3 08/01/2017   HCT 37.2 08/01/2017   MCV 77.5 (L) 08/01/2017   PLT 265 08/01/2017   Lab Results  Component Value Date   IRON 71 01/09/2017   TIBC 225 (L) 01/09/2017   IRONPCTSAT 32 (H) 01/09/2017   Lab Results  Component Value Date   FERRITIN 58 01/09/2017    STUDIES: No results found.  ASSESSMENT: Iron deficiency anemia, locally advanced basal cell carcinoma of the right leg.  PLAN:    1. Iron deficiency anemia: Patient's hemoglobin is now within normal limits. No intervention is needed at this time patient does not  require blood transfusion or IV iron today. 2. Basal cell carcinoma of the right leg: Patient states this lesion grew for 9 years and she did not seek medical attention. Diagnosis confirmed by biopsy from surgery. Previously, CT scan results reviewed independently and reported no obvious metastatic disease. Patient underwent embolization. She has had significant improvement of the size of her tumor with a combination of a hedgehog inhibitor, vismodegib (Erivedge) 150 mg daily and the embolization. Patient has now completed resection of her residual malignancy with clear margins and no obvious evidence of disease.  No further interventions are needed at this time.  Briefly discussed adjuvant chemotherapy, but determined this was not necessary.  Patient also states she would refuse any additional treatment.  Return to clinic in 4 months for further evaluation and postop evaluation.  3. Hypertension: Patient's blood pressure remains significantly elevated. She does not appear to be on hypertensive medications. Patient states her blood pressure is typically normal and attributes it to "white coat syndrome".  Will defer to primary care for further treatment and evaluation. 4.  Pulmonary nodule: CT scan from August 25, 2016 revealed an 8 mm left upper lobe pulmonary nodule.  Consider repeat CT scan in the near future.  Approximately 30 minutes was spent in discussion of which greater than 50% was consultation.  Patient expressed understanding and was in agreement with this plan. She also understands that She can call clinic at any time with any questions, concerns, or complaints.   Cancer Staging Basal cell carcinoma, leg, right Staging form: Melanoma of the Skin, AJCC 7th Edition - Clinical: No stage assigned - Unsigned   Lloyd Huger, MD   11/26/2017 10:04 PM

## 2017-11-28 ENCOUNTER — Inpatient Hospital Stay: Payer: Medicare HMO | Admitting: Oncology

## 2017-11-28 ENCOUNTER — Inpatient Hospital Stay: Payer: Medicare HMO

## 2017-11-28 ENCOUNTER — Encounter: Payer: Self-pay | Admitting: Family Medicine

## 2017-11-28 ENCOUNTER — Other Ambulatory Visit: Payer: Self-pay | Admitting: Family Medicine

## 2017-11-28 DIAGNOSIS — J4540 Moderate persistent asthma, uncomplicated: Secondary | ICD-10-CM

## 2017-11-28 MED ORDER — FLUTICASONE FUROATE-VILANTEROL 100-25 MCG/INH IN AEPB: INHALATION_SPRAY | RESPIRATORY_TRACT | 0 refills | Status: DC

## 2017-12-06 NOTE — Progress Notes (Signed)
Patricia Galloway  [phone] [phone]  ID: Patricia Galloway OB: 04-15-1951  MR#: [id number]  NID#:[id number]  Patient Care Team: [name], MD as PCP - General (Family Medicine) [name], MD as Attending Physician (Family Medicine) Robert Bellow, MD (General Surgery)  CHIEF COMPLAINT: Iron deficiency anemia, locally advanced basal cell carcinoma of the right leg.  INTERVAL HISTORY: Patient returns to clinic today for repeat laboratory work and further evaluation. She underwent complete resection of her residual malignancy in August 2018 and now is completely healed. She currently feels well and is asymptomatic. She has no neurologic complaints. She denies any recent fevers or illnesses. She has no chest pain or shortness of breath. She denies any nausea, vomiting, constipation, or diarrhea.  She has no melena or hematochezia.  She has no urinary complaints. Patient offers no specific complaints today.  REVIEW OF SYSTEMS:   Review of Systems  Constitutional: Negative for fever, malaise/fatigue and weight loss.  Respiratory: Negative.  Negative for cough and shortness of breath.   Cardiovascular: Negative.  Negative for chest pain and leg swelling.  Gastrointestinal: Negative for abdominal pain, nausea and vomiting.  Genitourinary: Negative.   Musculoskeletal: Negative.   Skin: Negative.  Negative for rash.  Neurological: Negative.  Negative for sensory change, focal weakness and weakness.  Psychiatric/Behavioral: Negative.  The patient is not nervous/anxious.     As per HPI. Otherwise, a complete review of systems is negative.  PAST MEDICAL HISTORY: Past Medical History:  Diagnosis Date  . Anemia    has had iron infusion  . Anxiety    complicated grief  . Aortic aneurysm ([place])    found on CT in December, 2017  . Asthma    as child , but no episodes in over 30 years  . Cancer ([place]) 08/2016   basal cell carcinoma - right leg   . History of transfusion of packed red blood cells     PAST SURGICAL HISTORY: Past Surgical History:  Procedure Laterality Date  . BASAL CELL CARCINOMA EXCISION Right 03/29/2017   Procedure: EXCISION OF RIGHT LEG BASEL CELL CARCINOMA;  Surgeon: [name], DO;  Location: [facility];  Service: Plastics;  Laterality: Right;  . COLON SURGERY  2002   colostomy for 10 months after a perfurated sigmoid   . COLOSTOMY REVERSAL  2003  . EMBOLIZATION Right 10/12/2016   Procedure: Embolization;  Surgeon: [name], MD;  Location: [facility];  Service: Cardiovascular;  Laterality: Right;  . etopic  1443,1540  . INCISION AND DRAINAGE OF WOUND Right 03/30/2017   Procedure: IRRIGATION AND DEBRIDEMENT WOUND; VAC PLACEMENT;  Surgeon: [name], DO;  Location: [facility];  Service: Plastics;  Laterality: Right;  . TONSILLECTOMY      FAMILY HISTORY: Family History  Problem Relation Age of Onset  . Dementia Mother   . Aortic stenosis Mother   . Osteoporosis Mother   . Dementia Father   . Diabetes Father   . Hyperlipidemia Father   . Hypertension Father   . Aneurysm Sister   . Hyperlipidemia Sister   . Hypertension Sister   . Diabetes Sister   . Breast cancer Neg Hx     ADVANCED DIRECTIVES (Y/N):  N  HEALTH MAINTENANCE: Social History   Tobacco Use  . Smoking status: Former Smoker    Packs/day: 0.50    Years: 25.00    Pack years: 12.50    Types: Cigarettes    Last attempt to quit:  08/03/1998    Years since quitting: 19.3  . Smokeless tobacco: Never Used  Substance Use Topics  . Alcohol use: No    Alcohol/week: 0.0 oz  . Drug use: No     Colonoscopy:  PAP:  Bone density:  Lipid panel:  Allergies  Allergen Reactions  . Penicillins Anaphylaxis    Has patient had a PCN reaction causing immediate rash, facial/tongue/throat swelling, SOB or lightheadedness with hypotension: Yes Has patient had a PCN reaction causing severe rash involving mucus  membranes or skin necrosis: No Has patient had a PCN reaction that required hospitalization: No Has patient had a PCN reaction occurring within the last 10 years: No If all of the above answers are "NO", then may proceed with Cephalosporin use.   . Codeine Nausea And Vomiting  . Latex Itching  . Sulfur Other (See Comments)    Reaction:  Redness/made skin bleed   . Tape Other (See Comments)    Reaction:  Blisters and burning  Pt states that paper tape is okay.    [address] Hcl] Anxiety    Current Outpatient Medications  Medication Sig Dispense Refill  . acidophilus (RISAQUAD) CAPS capsule Take 3 capsules by mouth at bedtime.    . cholecalciferol (VITAMIN D) 1000 units tablet Take 1,000 Units by mouth 2 (two) times daily.    . Coenzyme Q10 (COQ10) 100 MG CAPS Take 100 mg by mouth 2 (two) times daily.    . Digestive Enzyme CAPS Take 1 capsule by mouth 3 (three) times daily after meals.     . fluticasone furoate-vilanterol (BREO ELLIPTA) 100-25 MCG/INH AEPB INHALE 1 PUFF BY MOUTH INTO THE LUNGS DAILY 60 each 0  . Ginkgo Biloba Extract (GNP GINGKO BILOBA EXTRACT) 60 MG CAPS Take 60 mg by mouth daily.     . magnesium 30 MG tablet Take 60 mg by mouth daily.     . Multiple Vitamin (MULTIVITAMIN) capsule Take 2 capsules by mouth daily.     Marland Kitchen OVER THE COUNTER MEDICATION Take 1 tablet by mouth 2 (two) times daily. Mushroom Formula - Stamets 7    . OVER THE COUNTER MEDICATION Take 1 tablet by mouth 2 (two) times daily. Nutri-Calm    . Spirulina 500 MG TABS Take 500 mg by mouth daily.     Marland Kitchen UNABLE TO FIND Med Name: Alinda Dooms    . UNABLE TO FIND Med Name: liver care    . albuterol (PROVENTIL HFA;VENTOLIN HFA) 108 (90 Base) MCG/ACT inhaler Inhale 2 puffs into the lungs every 6 (six) hours as needed for wheezing or shortness of breath. (Patient not taking: Reported on 12/07/2017) 1 Inhaler 1   No current facility-administered medications for this visit.     OBJECTIVE: Vitals:    12/07/17 1431  BP: (!) 209/94  Pulse: 89  Resp: 20  Temp: (!) 97.2 F (36.2 C)     Body mass index is 21.49 kg/m.    ECOG FS:0 - Asymptomatic  General: Well-developed, well-nourished, no acute distress. Eyes: Pink conjunctiva, anicteric sclera. Lungs: Clear to auscultation bilaterally. Heart: Regular rate and rhythm. No rubs, murmurs, or gallops. Abdomen: Soft, nontender, nondistended. No organomegaly noted, normoactive bowel sounds. Musculoskeletal: No edema, cyanosis, or clubbing. Neuro: Alert, answering all questions appropriately. Cranial nerves grossly intact. Skin: Well-healed surgical scar on right medial leg. Psych: Normal affect.   LAB RESULTS:  Lab Results  Component Value Date   NA 135 12/07/2017   K 3.8 12/07/2017   CL 101 12/07/2017  CO2 25 12/07/2017   GLUCOSE 125 (H) 12/07/2017   BUN 23 (H) 12/07/2017   CREATININE 0.74 12/07/2017   CALCIUM 9.7 12/07/2017   PROT 7.4 12/07/2017   ALBUMIN 4.4 12/07/2017   AST 23 12/07/2017   ALT 17 12/07/2017   ALKPHOS 65 12/07/2017   BILITOT 0.5 12/07/2017   GFRNONAA >60 12/07/2017   GFRAA >60 12/07/2017    Lab Results  Component Value Date   WBC 7.5 12/07/2017   NEUTROABS 4.3 12/07/2017   HGB 13.5 12/07/2017   HCT 39.4 12/07/2017   MCV 88.4 12/07/2017   PLT 225 12/07/2017   Lab Results  Component Value Date   IRON 71 01/09/2017   TIBC 225 (L) 01/09/2017   IRONPCTSAT 32 (H) 01/09/2017   Lab Results  Component Value Date   FERRITIN 58 01/09/2017    STUDIES: No results found.  ASSESSMENT: Iron deficiency anemia, locally advanced basal cell carcinoma of the right leg.  PLAN:    1. Iron deficiency anemia: Patient's hemoglobin continues to be within normal limits. No intervention is needed at this time patient does not require blood transfusion or IV iron today. 2. Basal cell carcinoma of the right leg: Patient states this lesion grew for 9 years and she did not seek medical attention. Diagnosis confirmed  by biopsy from surgery. Previously, CT scan results reviewed independently and reported no obvious metastatic disease. Patient underwent embolization as well as to neoadjuvant treatment with vismodegib (Erivedge)150 mg daily, a hedgehog inhibitor. Patient has now completed resection of her residual malignancy with clear margins and no obvious evidence of disease.  No further interventions are needed at this time.  Previously, adjuvant chemotherapy was discussed, but determined this was not necessary.  Patient also states she would refuse any additional treatment.  Return to clinic in 3 months for further evaluation.  If everything remains stable, patient could possibly be discharged from clinic.    3. Hypertension: Patient's blood pressure remains significantly elevated. She does not appear to be on hypertensive medications. Patient states her blood pressure is typically normal and attributes it to "white coat syndrome".  Will defer to primary care for further treatment and evaluation. 4.  Pulmonary nodule: CT scan from August 25, 2016 revealed an 8 mm left upper lobe pulmonary nodule.  Repeat CT scan in 3 months to assess for interval change.   Patient expressed understanding and was in agreement with this plan. She also understands that She can call clinic at any time with any questions, concerns, or complaints.   Cancer Staging Basal cell carcinoma, leg, right Staging form: Melanoma of the Skin, AJCC 7th Edition - Clinical: No stage assigned - Unsigned   Lloyd Huger, MD   12/08/2017 10:17 AM

## 2017-12-07 ENCOUNTER — Inpatient Hospital Stay (HOSPITAL_BASED_OUTPATIENT_CLINIC_OR_DEPARTMENT_OTHER): Payer: Medicare HMO | Admitting: Oncology

## 2017-12-07 ENCOUNTER — Inpatient Hospital Stay: Payer: Medicare HMO | Attending: Oncology

## 2017-12-07 ENCOUNTER — Encounter: Payer: Self-pay | Admitting: Oncology

## 2017-12-07 VITALS — BP 209/94 | HR 89 | Temp 97.2°F | Resp 20 | Wt 121.3 lb

## 2017-12-07 DIAGNOSIS — I10 Essential (primary) hypertension: Secondary | ICD-10-CM | POA: Diagnosis not present

## 2017-12-07 DIAGNOSIS — C44712 Basal cell carcinoma of skin of right lower limb, including hip: Secondary | ICD-10-CM

## 2017-12-07 DIAGNOSIS — Z85828 Personal history of other malignant neoplasm of skin: Secondary | ICD-10-CM | POA: Diagnosis not present

## 2017-12-07 DIAGNOSIS — R911 Solitary pulmonary nodule: Secondary | ICD-10-CM

## 2017-12-07 DIAGNOSIS — D509 Iron deficiency anemia, unspecified: Secondary | ICD-10-CM | POA: Diagnosis not present

## 2017-12-07 DIAGNOSIS — D500 Iron deficiency anemia secondary to blood loss (chronic): Secondary | ICD-10-CM

## 2017-12-07 LAB — CBC WITH DIFFERENTIAL/PLATELET
Basophils Absolute: 0.1 10*3/uL (ref 0–0.1)
Basophils Relative: 1 %
EOS ABS: 0.1 10*3/uL (ref 0–0.7)
EOS PCT: 2 %
HCT: 39.4 % (ref 35.0–47.0)
HEMOGLOBIN: 13.5 g/dL (ref 12.0–16.0)
LYMPHS ABS: 2.1 10*3/uL (ref 1.0–3.6)
LYMPHS PCT: 28 %
MCH: 30.3 pg (ref 26.0–34.0)
MCHC: 34.3 g/dL (ref 32.0–36.0)
MCV: 88.4 fL (ref 80.0–100.0)
MONOS PCT: 11 %
Monocytes Absolute: 0.9 10*3/uL (ref 0.2–0.9)
NEUTROS ABS: 4.3 10*3/uL (ref 1.4–6.5)
NEUTROS PCT: 58 %
PLATELETS: 225 10*3/uL (ref 150–440)
RBC: 4.46 MIL/uL (ref 3.80–5.20)
RDW: 14.9 % — ABNORMAL HIGH (ref 11.5–14.5)
WBC: 7.5 10*3/uL (ref 3.6–11.0)

## 2017-12-07 LAB — COMPREHENSIVE METABOLIC PANEL
ALK PHOS: 65 U/L (ref 38–126)
ALT: 17 U/L (ref 14–54)
ANION GAP: 9 (ref 5–15)
AST: 23 U/L (ref 15–41)
Albumin: 4.4 g/dL (ref 3.5–5.0)
BUN: 23 mg/dL — ABNORMAL HIGH (ref 6–20)
CALCIUM: 9.7 mg/dL (ref 8.9–10.3)
CO2: 25 mmol/L (ref 22–32)
Chloride: 101 mmol/L (ref 101–111)
Creatinine, Ser: 0.74 mg/dL (ref 0.44–1.00)
GFR calc non Af Amer: >60 mL/min (ref >60)
GLUCOSE: 125 mg/dL — AB (ref 65–99)
Potassium: 3.8 mmol/L (ref 3.5–5.1)
Sodium: 135 mmol/L (ref 135–145)
Total Bilirubin: 0.5 mg/dL (ref 0.3–1.2)
Total Protein: 7.4 g/dL (ref 6.5–8.1)

## 2017-12-07 LAB — MAGNESIUM: Magnesium: 2.2 mg/dL (ref 1.7–2.4)

## 2017-12-07 NOTE — Progress Notes (Signed)
Patient denies any concerns today.  

## 2017-12-08 ENCOUNTER — Telehealth: Payer: Self-pay

## 2017-12-08 NOTE — Telephone Encounter (Signed)
-----   Message from [name], MD sent at 12/08/2017  4:20 PM EDT ----- Regarding: FW: elevated bp  Please make sure she came in before her visit for bp check with cma before her visit, BP very high.   ----- Message ----- From: Lloyd Huger, MD Sent: 12/08/2017   4:01 PM To: [name], MD Subject: RE: elevated bp                                That would be great!  Thank you!  ----- Message ----- From: [name], MD Sent: 12/08/2017   3:21 PM To: Lloyd Huger, MD Subject: elevated bp                                    Hi Dr. Grayland Ormond,   I have not seen patient since last April.  I will try contacting her for a follow up to check her bp. Thank you,   Drue Stager  ----- Message ----- From: Lloyd Huger, MD Sent: 12/08/2017  10:33 AM To: [name], MD

## 2017-12-08 NOTE — Telephone Encounter (Signed)
Called and left a voice message for patient to come next week to check her BP due to it being elevated. Dr. Ancil Boozer is very concerned about how high it is at Dr. Grayland Ormond office.

## 2017-12-08 NOTE — Progress Notes (Signed)
Called pt Patricia Galloway states Patricia Galloway is going to wait until her appt with Dr Ancil Boozer on 12/26/17. Pt stated it doesn't matter about her BP because Patricia Galloway has been diagnosed with white coat syndrome.

## 2017-12-26 ENCOUNTER — Ambulatory Visit (INDEPENDENT_AMBULATORY_CARE_PROVIDER_SITE_OTHER): Payer: Medicare HMO

## 2017-12-26 ENCOUNTER — Ambulatory Visit (INDEPENDENT_AMBULATORY_CARE_PROVIDER_SITE_OTHER): Payer: Medicare HMO | Admitting: Family Medicine

## 2017-12-26 ENCOUNTER — Encounter: Payer: Self-pay | Admitting: Family Medicine

## 2017-12-26 VITALS — BP 158/80 | HR 99 | Temp 98.5°F | Resp 12 | Ht 63.0 in | Wt 121.6 lb

## 2017-12-26 VITALS — BP 202/100 | HR 99 | Temp 98.5°F | Resp 16 | Ht 63.0 in | Wt 121.6 lb

## 2017-12-26 DIAGNOSIS — I700 Atherosclerosis of aorta: Secondary | ICD-10-CM

## 2017-12-26 DIAGNOSIS — K861 Other chronic pancreatitis: Secondary | ICD-10-CM | POA: Diagnosis not present

## 2017-12-26 DIAGNOSIS — R739 Hyperglycemia, unspecified: Secondary | ICD-10-CM

## 2017-12-26 DIAGNOSIS — M810 Age-related osteoporosis without current pathological fracture: Secondary | ICD-10-CM

## 2017-12-26 DIAGNOSIS — R030 Elevated blood-pressure reading, without diagnosis of hypertension: Secondary | ICD-10-CM

## 2017-12-26 DIAGNOSIS — F325 Major depressive disorder, single episode, in full remission: Secondary | ICD-10-CM

## 2017-12-26 DIAGNOSIS — H539 Unspecified visual disturbance: Secondary | ICD-10-CM

## 2017-12-26 DIAGNOSIS — Z1159 Encounter for screening for other viral diseases: Secondary | ICD-10-CM

## 2017-12-26 DIAGNOSIS — J449 Chronic obstructive pulmonary disease, unspecified: Secondary | ICD-10-CM

## 2017-12-26 DIAGNOSIS — J4540 Moderate persistent asthma, uncomplicated: Secondary | ICD-10-CM

## 2017-12-26 DIAGNOSIS — Z0000 Encounter for general adult medical examination without abnormal findings: Secondary | ICD-10-CM | POA: Diagnosis not present

## 2017-12-26 DIAGNOSIS — Z23 Encounter for immunization: Secondary | ICD-10-CM

## 2017-12-26 DIAGNOSIS — R69 Illness, unspecified: Secondary | ICD-10-CM | POA: Diagnosis not present

## 2017-12-26 DIAGNOSIS — Z1211 Encounter for screening for malignant neoplasm of colon: Secondary | ICD-10-CM | POA: Diagnosis not present

## 2017-12-26 LAB — LIPID PANEL
CHOL/HDL RATIO: 2.7 (calc) (ref <5.0)
CHOLESTEROL: 243 mg/dL — AB (ref <200)
HDL: 90 mg/dL (ref >50)
LDL CHOLESTEROL (CALC): 138 mg/dL — AB
NON-HDL CHOLESTEROL (CALC): 153 mg/dL — AB (ref <130)
Triglycerides: 56 mg/dL (ref <150)

## 2017-12-26 MED ORDER — LORATADINE 10 MG PO TABS: 10.0000 mg | ORAL_TABLET | Freq: Every day | ORAL | 11 refills | Status: DC

## 2017-12-26 MED ORDER — ALBUTEROL SULFATE HFA 108 (90 BASE) MCG/ACT IN AERS: 2.0000 | INHALATION_SPRAY | Freq: Four times a day (QID) | RESPIRATORY_TRACT | 0 refills | Status: DC | PRN

## 2017-12-26 MED ORDER — ZOSTER VAC RECOMB ADJUVANTED 50 MCG/0.5ML IM SUSR: 0.5000 mL | Freq: Once | INTRAMUSCULAR | 1 refills | Status: AC

## 2017-12-26 MED ORDER — FLUTICASONE FUROATE-VILANTEROL 100-25 MCG/INH IN AEPB: INHALATION_SPRAY | RESPIRATORY_TRACT | 5 refills | Status: DC

## 2017-12-26 MED ORDER — GUAIFENESIN ER 600 MG PO TB12: 600.0000 mg | ORAL_TABLET | Freq: Two times a day (BID) | ORAL | 0 refills | Status: DC

## 2017-12-26 MED ORDER — DIAZEPAM 5 MG PO TABS: 5.0000 mg | ORAL_TABLET | Freq: Every day | ORAL | 0 refills | Status: DC | PRN

## 2017-12-26 NOTE — Patient Instructions (Signed)
Patricia Galloway , Thank you for taking time to come for your Medicare Wellness Visit. I appreciate your ongoing commitment to your health goals. Please review the following plan we discussed and let me know if I can assist you in the future.   Screening recommendations/referrals: Colorectal Screening: Please contact the company that supplied you with your Cologuard supplies to determine these supplies have not expired Mammogram: Completed 07/07/17. Repeat every year Bone Density: Completed 07/07/17. Osteoporotic screening no longer required Lung Cancer Screening: You do not qualify for this screening Hepatitis C Screening: You do not qualify for this screening  Vision and Dental Exams: Recommended annual ophthalmology exams for early detection of glaucoma and other disorders of the eye Recommended annual dental exams for proper oral hygiene  Vaccinations: Influenza vaccine: Overdue Pneumococcal vaccine: Declined Tdap vaccine: Up to date Shingles vaccine: Please call your insurance company to determine your out of pocket expense for the Shingrix vaccine. You may also receive this vaccine at your local pharmacy or Health Dept.   Advanced directives: We have received a copy of your POA (Power of [place]). These documents can be located in your chart. Please bring a copy of your Living Will to your next appointment.  Conditions/risks identified: Recommend to drink at least 6-8 8oz glasses of water per day.  Next appointment: Please schedule your Annual Wellness Visit with your Nurse Health Advisor in one year.  Preventive Care 72 Years and Older, Female Preventive care refers to lifestyle choices and visits with your health care provider that can promote health and wellness. What does preventive care include?  A yearly physical exam. This is also called an annual well check.  Dental exams once or twice a year.  Routine eye exams. Ask your health care provider how often you should have your eyes  checked.  Personal lifestyle choices, including:  Daily care of your teeth and gums.  Regular physical activity.  Eating a healthy diet.  Avoiding tobacco and drug use.  Limiting alcohol use.  Practicing safe sex.  Taking low-dose aspirin every day.  Taking vitamin and mineral supplements as recommended by your health care provider. What happens during an annual well check? The services and screenings done by your health care provider during your annual well check will depend on your age, overall health, lifestyle risk factors, and family history of disease. Counseling  Your health care provider may ask you questions about your:  Alcohol use.  Tobacco use.  Drug use.  Emotional well-being.  Home and relationship well-being.  Sexual activity.  Eating habits.  History of falls.  Memory and ability to understand (cognition).  Work and work Statistician.  Reproductive health. Screening  You may have the following tests or measurements:  Height, weight, and BMI.  Blood pressure.  Lipid and cholesterol levels. These may be checked every 5 years, or more frequently if you are over 59 years old.  Skin check.  Lung cancer screening. You may have this screening every year starting at age 73 if you have a 30-pack-year history of smoking and currently smoke or have quit within the past 15 years.  Fecal occult blood test (FOBT) of the stool. You may have this test every year starting at age 49.  Flexible sigmoidoscopy or colonoscopy. You may have a sigmoidoscopy every 5 years or a colonoscopy every 10 years starting at age 58.  Hepatitis C blood test.  Hepatitis B blood test.  Sexually transmitted disease (STD) testing.  Diabetes screening. This is done  by checking your blood sugar (glucose) after you have not eaten for a while (fasting). You may have this done every 1-3 years.  Bone density scan. This is done to screen for osteoporosis. You may have this done  starting at age 67.  Mammogram. This may be done every 1-2 years. Talk to your health care provider about how often you should have regular mammograms. Talk with your health care provider about your test results, treatment options, and if necessary, the need for more tests. Vaccines  Your health care provider may recommend certain vaccines, such as:  Influenza vaccine. This is recommended every year.  Tetanus, diphtheria, and acellular pertussis (Tdap, Td) vaccine. You may need a Td booster every 10 years.  Zoster vaccine. You may need this after age 52.  Pneumococcal 13-valent conjugate (PCV13) vaccine. One dose is recommended after age 86.  Pneumococcal polysaccharide (PPSV23) vaccine. One dose is recommended after age 39. Talk to your health care provider about which screenings and vaccines you need and how often you need them. This information is not intended to replace advice given to you by your health care provider. Make sure you discuss any questions you have with your health care provider. Document Released: 09/18/2015 Document Revised: 05/11/2016 Document Reviewed: 06/23/2015 Elsevier Interactive Patient Education  2017 [place] Prevention in the Home Falls can cause injuries. They can happen to people of all ages. There are many things you can do to make your home safe and to help prevent falls. What can I do on the outside of my home?  Regularly fix the edges of walkways and driveways and fix any cracks.  Remove anything that might make you trip as you walk through a door, such as a raised step or threshold.  Trim any bushes or trees on the path to your home.  Use bright outdoor lighting.  Clear any walking paths of anything that might make someone trip, such as rocks or tools.  Regularly check to see if handrails are loose or broken. Make sure that both sides of any steps have handrails.  Any raised decks and porches should have guardrails on the  edges.  Have any leaves, snow, or ice cleared regularly.  Use sand or salt on walking paths during winter.  Clean up any spills in your garage right away. This includes oil or grease spills. What can I do in the bathroom?  Use night lights.  Install grab bars by the toilet and in the tub and shower. Do not use towel bars as grab bars.  Use non-skid mats or decals in the tub or shower.  If you need to sit down in the shower, use a plastic, non-slip stool.  Keep the floor dry. Clean up any water that spills on the floor as soon as it happens.  Remove soap buildup in the tub or shower regularly.  Attach bath mats securely with double-sided non-slip rug tape.  Do not have throw rugs and other things on the floor that can make you trip. What can I do in the bedroom?  Use night lights.  Make sure that you have a light by your bed that is easy to reach.  Do not use any sheets or blankets that are too big for your bed. They should not hang down onto the floor.  Have a firm chair that has side arms. You can use this for support while you get dressed.  Do not have throw rugs and other things on  the floor that can make you trip. What can I do in the kitchen?  Clean up any spills right away.  Avoid walking on wet floors.  Keep items that you use a lot in easy-to-reach places.  If you need to reach something above you, use a strong step stool that has a grab bar.  Keep electrical cords out of the way.  Do not use floor polish or wax that makes floors slippery. If you must use wax, use non-skid floor wax.  Do not have throw rugs and other things on the floor that can make you trip. What can I do with my stairs?  Do not leave any items on the stairs.  Make sure that there are handrails on both sides of the stairs and use them. Fix handrails that are broken or loose. Make sure that handrails are as long as the stairways.  Check any carpeting to make sure that it is firmly  attached to the stairs. Fix any carpet that is loose or worn.  Avoid having throw rugs at the top or bottom of the stairs. If you do have throw rugs, attach them to the floor with carpet tape.  Make sure that you have a light switch at the top of the stairs and the bottom of the stairs. If you do not have them, ask someone to add them for you. What else can I do to help prevent falls?  Wear shoes that:  Do not have high heels.  Have rubber bottoms.  Are comfortable and fit you well.  Are closed at the toe. Do not wear sandals.  If you use a stepladder:  Make sure that it is fully opened. Do not climb a closed stepladder.  Make sure that both sides of the stepladder are locked into place.  Ask someone to hold it for you, if possible.  Clearly mark and make sure that you can see:  Any grab bars or handrails.  First and last steps.  Where the edge of each step is.  Use tools that help you move around (mobility aids) if they are needed. These include:  Canes.  Walkers.  Scooters.  Crutches.  Turn on the lights when you go into a dark area. Replace any light bulbs as soon as they burn out.  Set up your furniture so you have a clear path. Avoid moving your furniture around.  If any of your floors are uneven, fix them.  If there are any pets around you, be aware of where they are.  Review your medicines with your doctor. Some medicines can make you feel dizzy. This can increase your chance of falling. Ask your doctor what other things that you can do to help prevent falls. This information is not intended to replace advice given to you by your health care provider. Make sure you discuss any questions you have with your health care provider. Document Released: 06/18/2009 Document Revised: 01/28/2016 Document Reviewed: 09/26/2014 Elsevier Interactive Patient Education  2017 [employer].

## 2017-12-26 NOTE — Patient Instructions (Signed)
Prolia Forteo

## 2017-12-26 NOTE — Progress Notes (Signed)
Name: Patricia Galloway   MRN: [id number]    DOB: Jun 22, 1951   Date:12/26/2017       Progress Note  Subjective  Chief Complaint  Chief Complaint  Patient presents with  . Annual Exam    HPI   Basal cell carcinoma right leg: she is doing well, s/p removal and chemotherapy. Happy with results. She needs to return for pap smear  White coat HTN: she does not want medication at this time, bp during pulmonary rehab was always normal, reviewed logs with patient today. She states that bp goes up when she gets to doctor's visits. She refuses to take bp medication, asked her to monitor bp at home and to take valium before office visits.   Iron deficiency anemia: resolved, no longer bleeding from her leg, and is doing well now.   Major Depression in remission: doing well, only taking Valium prn, 30 pills lasted almost 6 months   Asthma/COPD: using Breo daily, she was doing well, until pollen count went up, noticed productive cough described as clear or white, initially had SOB but doing better now. She still has episode of wheezing, but not on regular basis. Discussed adding allergy medications.   Atherosclerosis aorta: refuses statin therapy, discussed baby aspirin and she will consider it  Osteoporosis: discussed options, she is willing to see Endocrinologist Discussed Prolia and Forteo, bone density done 07/2017. She also needs to return for pap smear    Patient Active Problem List   Diagnosis Date Noted  . Protein-calorie malnutrition, severe 03/31/2017  . Pain in limb 03/29/2017  . Basal cell carcinoma 03/29/2017  . Thoracic aortic aneurysm without rupture ([place]) 10/31/2016  . Atherosclerosis of aorta ([place]) 10/31/2016  . Chronic pancreatitis ([place]) 10/31/2016  . Pulmonary nodule 10/31/2016  . Centrilobular emphysema ([place]) 10/31/2016  . Iron deficiency anemia due to chronic blood loss 10/24/2016  . Basal cell carcinoma, leg, right 08/24/2016  . Hyperglycemia 08/01/2016  . Chronic  thoracic back pain 08/04/2015  . Panic attack 08/04/2015  . Complicated grieving 50/11/7046  . White coat syndrome without diagnosis of hypertension 08/04/2015  . Major depression in remission ([place]) 08/04/2015  . Asthma, moderate persistent, poorly-controlled 08/04/2015    Past Surgical History:  Procedure Laterality Date  . BASAL CELL CARCINOMA EXCISION Right 03/29/2017   Procedure: EXCISION OF RIGHT LEG BASEL CELL CARCINOMA;  Surgeon: [name], DO;  Location: [facility];  Service: Plastics;  Laterality: Right;  . COLON SURGERY  2002   colostomy for 10 months after a perfurated sigmoid   . COLOSTOMY REVERSAL  2003  . EMBOLIZATION Right 10/12/2016   Procedure: Embolization;  Surgeon: [name], MD;  Location: [facility];  Service: Cardiovascular;  Laterality: Right;  . etopic  8891,6945  . INCISION AND DRAINAGE OF WOUND Right 03/30/2017   Procedure: IRRIGATION AND DEBRIDEMENT WOUND; VAC PLACEMENT;  Surgeon: [name], DO;  Location: [facility];  Service: Plastics;  Laterality: Right;  . TONSILLECTOMY      Family History  Problem Relation Age of Onset  . Dementia Mother   . Aortic stenosis Mother   . Osteoporosis Mother   . Dementia Father   . Diabetes Father   . Hyperlipidemia Father   . Hypertension Father   . Aneurysm Sister   . Hyperlipidemia Sister   . Hypertension Sister   . Diabetes Sister   . Breast cancer Neg Hx     Social History   Socioeconomic History  . Marital status: Married  Spouse name: [name]  . Number of children: 0  . Years of education: Not on file  . Highest education level: Bachelor's degree (e.g., BA, AB, BS)  Occupational History  . Occupation: Retired  Scientific laboratory technician  . Financial resource strain: Not hard at all  . Food insecurity:    Worry: Never true    Inability: Never true  . Transportation needs:    Medical: No    Non-medical: No  Tobacco Use  . Smoking status: Former Smoker    Packs/day: 0.50     Years: 25.00    Pack years: 12.50    Types: Cigarettes    Start date: 09/05/1972    Last attempt to quit: 08/03/1998    Years since quitting: 19.4  . Smokeless tobacco: Never Used  . Tobacco comment: smoking cessation materials not required  Substance and Sexual Activity  . Alcohol use: No    Alcohol/week: 0.0 oz  . Drug use: No  . Sexual activity: Not Currently    Birth control/protection: Post-menopausal  Lifestyle  . Physical activity:    Days per week: 2 days    Minutes per session: 60 min  . Stress: Only a little  Relationships  . Social connections:    Talks on phone: Patient refused    Gets together: Patient refused    Attends religious service: Patient refused    Active member of club or organization: Patient refused    Attends meetings of clubs or organizations: Patient refused    Relationship status: Married  . Intimate partner violence:    Fear of current or ex partner: No    Emotionally abused: No    Physically abused: No    Forced sexual activity: No  Other Topics Concern  . Not on file  Social History Narrative  . Not on file     Current Outpatient Medications:  .  acidophilus (RISAQUAD) CAPS capsule, Take 3 capsules by mouth at bedtime., Disp: , Rfl:  .  cholecalciferol (VITAMIN D) 1000 units tablet, Take 1,000 Units by mouth 2 (two) times daily., Disp: , Rfl:  .  Coenzyme Q10 (COQ10) 100 MG CAPS, Take 100 mg by mouth 2 (two) times daily., Disp: , Rfl:  .  Digestive Enzyme CAPS, Take 1 capsule by mouth 3 (three) times daily after meals. , Disp: , Rfl:  .  fluticasone furoate-vilanterol (BREO ELLIPTA) 100-25 MCG/INH AEPB, INHALE 1 PUFF BY MOUTH INTO THE LUNGS DAILY, Disp: 60 each, Rfl: 5 .  Ginkgo Biloba Extract (GNP GINGKO BILOBA EXTRACT) 60 MG CAPS, Take 60 mg by mouth daily. , Disp: , Rfl:  .  magnesium 30 MG tablet, Take 60 mg by mouth daily. , Disp: , Rfl:  .  Multiple Vitamin (MULTIVITAMIN) capsule, Take 2 capsules by mouth daily. , Disp: , Rfl:  .   OVER THE COUNTER MEDICATION, Take 1 tablet by mouth 2 (two) times daily. Mushroom Formula - [place] 7, Disp: , Rfl:  .  OVER THE COUNTER MEDICATION, Take 1 tablet by mouth 2 (two) times daily. Nutri-Calm, Disp: , Rfl:  .  Spirulina 500 MG TABS, Take 500 mg by mouth daily. , Disp: , Rfl:  .  UNABLE TO FIND, Med Name: [place] Balm, Disp: , Rfl:  .  UNABLE TO FIND, Med Name: liver care, Disp: , Rfl:  .  albuterol (PROVENTIL HFA;VENTOLIN HFA) 108 (90 Base) MCG/ACT inhaler, Inhale 2 puffs into the lungs every 6 (six) hours as needed for wheezing or shortness of breath., Disp:  1 Inhaler, Rfl: 0 .  diazepam (VALIUM) 5 MG tablet, Take 1 tablet (5 mg total) by mouth daily as needed for anxiety. 30 minutes before doctor's visit, Disp: 30 tablet, Rfl: 0 .  Zoster Vaccine Adjuvanted (SHINGRIX) injection, Inject 0.5 mLs into the muscle once for 1 dose., Disp: 0.5 mL, Rfl: 1  Allergies  Allergen Reactions  . Penicillins Anaphylaxis    Has patient had a PCN reaction causing immediate rash, facial/tongue/throat swelling, SOB or lightheadedness with hypotension: Yes Has patient had a PCN reaction causing severe rash involving mucus membranes or skin necrosis: No Has patient had a PCN reaction that required hospitalization: No Has patient had a PCN reaction occurring within the last 10 years: No If all of the above answers are "NO", then may proceed with Cephalosporin use.   . Codeine Nausea And Vomiting  . Latex Itching  . Sulfur Other (See Comments)    Reaction:  Redness/made skin bleed   . Tape Other (See Comments)    Reaction:  Blisters and burning  Pt states that paper tape is okay.    [address] Hcl] Anxiety     ROS  Constitutional: Negative for fever or  weight change.  Respiratory: Positive for cough no shortness of breath at this time.   Cardiovascular: Negative for chest pain or palpitations.  Gastrointestinal: Negative for abdominal pain, no bowel changes.  Musculoskeletal:  Negative for gait problem or joint swelling.  Skin: Negative for rash.  Neurological: Negative for dizziness or headache.  No other specific complaints in a complete review of systems (except as listed in HPI above).  Objective  Vitals:   12/26/17 1035 12/26/17 1111  BP: (!) 155/60 (!) 202/100  Pulse: 99   Resp: 16   Temp: 98.5 F (36.9 C)   TempSrc: Oral   SpO2: 97%   Weight: 121 lb 9.6 oz (55.2 kg)   Height: 5' 3" (1.6 m)     Body mass index is 21.54 kg/m.  Physical Exam  Constitutional: Patient appears well-developed and well-nourished.  No distress.  HEENT: head atraumatic, normocephalic, pupils equal and reactive to light, neck supple, throat within normal limits Cardiovascular: Normal rate, regular rhythm and normal heart sounds.  No murmur heard. No BLE edema. Pulmonary/Chest: Effort normal and breath sounds normal. No respiratory distress. Abdominal: Soft.  There is no tenderness. Muscular Skeletal: well healed scar from previous basal cell on right thigh Psychiatric: Patient has a normal mood and affect. behavior is normal. Judgment and thought content normal.  Recent Results (from the past 2160 hour(s))  Magnesium     Status: None   Collection Time: 12/07/17  1:50 PM  Result Value Ref Range   Magnesium 2.2 1.7 - 2.4 mg/dL    Comment: Performed at [facility], [address] [place], [place] [address]  Comprehensive metabolic panel     Status: Abnormal   Collection Time: 12/07/17  1:50 PM  Result Value Ref Range   Sodium 135 135 - 145 mmol/L   Potassium 3.8 3.5 - 5.1 mmol/L   Chloride 101 101 - 111 mmol/L   CO2 25 22 - 32 mmol/L   Glucose, Bld 125 (H) 65 - 99 mg/dL   BUN 23 (H) 6 - 20 mg/dL   Creatinine, Ser 0.74 0.44 - 1.00 mg/dL   Calcium 9.7 8.9 - 10.3 mg/dL   Total Protein 7.4 6.5 - 8.1 g/dL   Albumin 4.4 3.5 - 5.0 g/dL   AST 23 15 - 41  U/L   ALT 17 14 - 54 U/L   Alkaline Phosphatase 65 38 - 126 U/L   Total Bilirubin 0.5 0.3 - 1.2 mg/dL    GFR calc non Af Amer >60 >60 mL/min   GFR calc Af Amer >60 >60 mL/min    Comment: (NOTE) The eGFR has been calculated using the CKD EPI equation. This calculation has not been validated in all clinical situations. eGFR's persistently <60 mL/min signify possible Chronic Kidney Disease.    Anion gap 9 5 - 15    Comment: Performed at [facility], [address] [place], [place] [address]  CBC with Differential     Status: Abnormal   Collection Time: 12/07/17  1:50 PM  Result Value Ref Range   WBC 7.5 3.6 - 11.0 K/uL   RBC 4.46 3.80 - 5.20 MIL/uL   Hemoglobin 13.5 12.0 - 16.0 g/dL   HCT 39.4 35.0 - 47.0 %   MCV 88.4 80.0 - 100.0 fL   MCH 30.3 26.0 - 34.0 pg   MCHC 34.3 32.0 - 36.0 g/dL   RDW 14.9 (H) 11.5 - 14.5 %   Platelets 225 150 - 440 K/uL   Neutrophils Relative % 58 %   Neutro Abs 4.3 1.4 - 6.5 K/uL   Lymphocytes Relative 28 %   Lymphs Abs 2.1 1.0 - 3.6 K/uL   Monocytes Relative 11 %   Monocytes Absolute 0.9 0.2 - 0.9 K/uL   Eosinophils Relative 2 %   Eosinophils Absolute 0.1 0 - 0.7 K/uL   Basophils Relative 1 %   Basophils Absolute 0.1 0 - 0.1 K/uL    Comment: Performed at [facility], [address] [place], [place] [address]     PHQ2/9: Depression screen [facility] 2/9 12/26/2017 10/31/2016 08/01/2016 03/09/2016 11/10/2015  Decreased Interest 0 0 0 0 0  Down, Depressed, Hopeless 1 0 0 0 0  PHQ - 2 Score 1 0 0 0 0  Altered sleeping 0 - - - -  Tired, decreased energy 0 - - - -  Change in appetite 0 - - - -  Feeling bad or failure about yourself  0 - - - -  Trouble concentrating 0 - - - -  Moving slowly or fidgety/restless 0 - - - -  Suicidal thoughts 0 - - - -  PHQ-9 Score 1 - - - -  Difficult doing work/chores Not difficult at all - - - -     Fall Risk: Fall Risk  12/26/2017 10/31/2016 08/01/2016 03/09/2016 11/10/2015  Falls in the past year? Yes No No No No  Comment fell on ice - - - -  Number falls in past yr: 1 - - - -  Injury with Fall? No - - - -   Follow up Falls evaluation completed;Education provided;Falls prevention discussed - - - -     Assessment & Plan  1. Asthma, moderate persistent, poorly-controlled  - fluticasone furoate-vilanterol (BREO ELLIPTA) 100-25 MCG/INH AEPB; INHALE 1 PUFF BY MOUTH INTO THE LUNGS DAILY  Dispense: 60 each; Refill: 5 - albuterol (PROVENTIL HFA;VENTOLIN HFA) 108 (90 Base) MCG/ACT inhaler; Inhale 2 puffs into the lungs every 6 (six) hours as needed for wheezing or shortness of breath.  Dispense: 1 Inhaler; Refill: 0 - loratadine (CLARITIN) 10 MG tablet; Take 1 tablet (10 mg total) by mouth daily.  Dispense: 30 tablet; Refill: 11 - guaiFENesin (MUCINEX) 600 MG 12 hr tablet; Take 1 tablet (600 mg total) by mouth 2 (two) times daily.  Dispense: 30 tablet; Refill: 0   2. COPD with asthma ([place])  On Breo and doing well  Emphysema on CT chest  3. Depression, major, in remission [facility])  She is struggling since her dog of 14 years died recently but getting better now  4. Atherosclerosis of aorta ([facility])  Refuses statin therapy, discussed Zetia  - Lipid panel  5. Chronic pancreatitis, unspecified pancreatitis type ([place])  stable  6. Need for shingles vaccine  refused  7. White coat syndrome without diagnosis of hypertension  - diazepam (VALIUM) 5 MG tablet; Take 1 tablet (5 mg total) by mouth daily as needed for anxiety. 30 minutes before doctor's visit  Dispense: 30 tablet; Refill: 0  8. Need for hepatitis C screening test  - Hepatitis C Antibody  9. Hyperglycemia  - Hemoglobin A1c  10. Need for vaccination for pneumococcus  refused  11. Age-related osteoporosis without current pathological fracture  - TSH - PTH, Intact and Calcium - Referral endocrinologist

## 2017-12-26 NOTE — Progress Notes (Addendum)
Subjective:   Patricia Galloway is a 67 y.o. female who presents for Medicare Annual (Subsequent) preventive examination.  Review of Systems:  N/A Cardiac Risk Factors include: advanced age (>90men, >68 women)     Objective:     Vitals: BP (!) 158/80 (BP Location: Left Arm, Patient Position: Sitting, Cuff Size: Normal)   Pulse 99   Temp 98.5 F (36.9 C) (Oral)   Resp 12   Ht 5\' 3"  (1.6 m)   Wt 121 lb 9.6 oz (55.2 kg)   SpO2 97%   BMI 21.54 kg/m   Body mass index is 21.54 kg/m.  Advanced Directives 12/26/2017 04/25/2017 03/29/2017 03/13/2017 01/09/2017 12/26/2016 12/05/2016  Does Patient Have a Medical Advance Directive? Yes Yes No No Yes Yes No  Type of Paramedic of [place];Living will [place];Living will - - Healthcare Power of [place] -  Does patient want to make changes to medical advance directive? - - - - No - Patient declined - -  Copy of [place] in Chart? Yes - - - Yes - -  Would patient like information on creating a medical advance directive? - - No - Patient declined No - Patient declined - - No - Patient declined    Tobacco Social History   Tobacco Use  Smoking Status Former Smoker  . Packs/day: 0.50  . Years: 25.00  . Pack years: 12.50  . Types: Cigarettes  . Last attempt to quit: 08/03/1998  . Years since quitting: 19.4  Smokeless Tobacco Never Used  Tobacco Comment   smoking cessation materials not required     Counseling given: No Comment: smoking cessation materials not required   Clinical Intake:  Pre-visit preparation completed: Yes  Pain : No/denies pain   BMI - recorded: 21.54 Nutritional Status: BMI of 19-24  Normal Nutritional Risks: None Diabetes: No  How often do you need to have someone help you when you read instructions, pamphlets, or other written materials from your doctor or pharmacy?: 1 - Never  Interpreter Needed?: No  Information  entered by :: AEversole, LPN  Hospitalizations/ED visits and surgeries occurring within the previous 12 months:  Within the previous 12 months, pt has not been hospitalized for any conditions and has not been treated by an emergency room clinician.  However, pt underwent surgery on 03/29/17 with Dr. Marla Roe at [facility] for R leg [facility] excision. In addition, pt also underwent surgery on 03/30/17 with Dr. Marla Roe at [facility] for I&D R leg.   Past Medical History:  Diagnosis Date  . Anemia    has had iron infusion  . Anxiety    complicated grief  . Aortic aneurysm ([place])    found on CT in December, 2017  . Asthma    as child , but no episodes in over 30 years  . Basal cell carcinoma   . Cancer ([place]) 08/2016   basal cell carcinoma - right leg  . History of transfusion of packed red blood cells    Past Surgical History:  Procedure Laterality Date  . BASAL CELL CARCINOMA EXCISION Right 03/29/2017   Procedure: EXCISION OF RIGHT LEG BASEL CELL CARCINOMA;  Surgeon: [name], DO;  Location: [place];  Service: Plastics;  Laterality: Right;  . COLON SURGERY  2002   colostomy for 10 months after a perfurated sigmoid   . COLOSTOMY REVERSAL  2003  . EMBOLIZATION Right 10/12/2016   Procedure: Embolization;  Surgeon: Hortencia Pilar  G, MD;  Location: [place] [facility];  Service: Cardiovascular;  Laterality: Right;  . etopic  7124,5809  . INCISION AND DRAINAGE OF WOUND Right 03/30/2017   Procedure: IRRIGATION AND DEBRIDEMENT WOUND; VAC PLACEMENT;  Surgeon: [name], DO;  Location: [facility];  Service: Plastics;  Laterality: Right;  . TONSILLECTOMY     Family History  Problem Relation Age of Onset  . Dementia Mother   . Aortic stenosis Mother   . Osteoporosis Mother   . Dementia Father   . Diabetes Father   . Hyperlipidemia Father   . Hypertension Father   . Aneurysm Sister   . Hyperlipidemia Sister   . Hypertension Sister   . Diabetes Sister   . Breast cancer Neg Hx     Social History   Socioeconomic History  . Marital status: Married    Spouse name: [name]  . Number of children: 0  . Years of education: Not on file  . Highest education level: Bachelor's degree (e.g., BA, AB, BS)  Occupational History  . Occupation: Retired  Scientific laboratory technician  . Financial resource strain: Not hard at all  . Food insecurity:    Worry: Never true    Inability: Never true  . Transportation needs:    Medical: No    Non-medical: No  Tobacco Use  . Smoking status: Former Smoker    Packs/day: 0.50    Years: 25.00    Pack years: 12.50    Types: Cigarettes    Last attempt to quit: 08/03/1998    Years since quitting: 19.4  . Smokeless tobacco: Never Used  . Tobacco comment: smoking cessation materials not required  Substance and Sexual Activity  . Alcohol use: No    Alcohol/week: 0.0 oz  . Drug use: No  . Sexual activity: Not Currently    Birth control/protection: Post-menopausal  Lifestyle  . Physical activity:    Days per week: 2 days    Minutes per session: 60 min  . Stress: Only a little  Relationships  . Social connections:    Talks on phone: Patient refused    Gets together: Patient refused    Attends religious service: Patient refused    Active member of club or organization: Patient refused    Attends meetings of clubs or organizations: Patient refused    Relationship status: Married  Other Topics Concern  . Not on file  Social History Narrative  . Not on file    Outpatient Encounter Medications as of 12/26/2017  Medication Sig  . cholecalciferol (VITAMIN D) 1000 units tablet Take 1,000 Units by mouth 2 (two) times daily.  . Coenzyme Q10 (COQ10) 100 MG CAPS Take 100 mg by mouth 2 (two) times daily.  . Digestive Enzyme CAPS Take 1 capsule by mouth 3 (three) times daily after meals.   . fluticasone furoate-vilanterol (BREO ELLIPTA) 100-25 MCG/INH AEPB INHALE 1 PUFF BY MOUTH INTO THE LUNGS DAILY  . magnesium 30 MG tablet Take 60 mg by mouth daily.    . Multiple Vitamin (MULTIVITAMIN) capsule Take 2 capsules by mouth daily.   Marland Kitchen OVER THE COUNTER MEDICATION Take 1 tablet by mouth 2 (two) times daily. Nutri-Calm  . Spirulina 500 MG TABS Take 500 mg by mouth daily.   Marland Kitchen UNABLE TO FIND Med Name: liver care  . acidophilus (RISAQUAD) CAPS capsule Take 3 capsules by mouth at bedtime.  Marland Kitchen albuterol (PROVENTIL HFA;VENTOLIN HFA) 108 (90 Base) MCG/ACT inhaler Inhale 2 puffs into the lungs every 6 (six)  hours as needed for wheezing or shortness of breath. (Patient not taking: Reported on 12/07/2017)  . Ginkgo Biloba Extract (GNP GINGKO BILOBA EXTRACT) 60 MG CAPS Take 60 mg by mouth daily.   Marland Kitchen OVER THE COUNTER MEDICATION Take 1 tablet by mouth 2 (two) times daily. Mushroom Formula - Stamets 7  . UNABLE TO FIND Med Name: Alinda Dooms   No facility-administered encounter medications on file as of 12/26/2017.     Activities of Daily Living In your present state of health, do you have any difficulty performing the following activities: 12/26/2017 03/30/2017  Hearing? N N  Comment denies hearing aids -  Vision? N N  Comment wears reading glasses -  Difficulty concentrating or making decisions? N N  Walking or climbing stairs? N Y  Dressing or bathing? N N  Doing errands, shopping? N Y  Conservation officer, nature and eating ? N -  Comment denies dentures -  Using the Toilet? N -  In the past six months, have you accidently leaked urine? N -  Do you have problems with loss of bowel control? N -  Managing your Medications? N -  Managing your Finances? N -  Housekeeping or managing your Housekeeping? N -  Some recent data might be hidden    Patient Care Team: [name], MD as PCP - General (Family Medicine) Bary Castilla, Forest Gleason, MD (General Surgery) Lloyd Huger, MD as Consulting Physician (Oncology)    Assessment:   This is a routine wellness examination for Jam[place].  Exercise Activities and Dietary recommendations Current Exercise Habits:  Structured exercise class, Type of exercise: strength training/weights;Other - see comments(cardio), Time (Minutes): 60, Frequency (Times/Week): 2, Weekly Exercise (Minutes/Week): 120, Intensity: Mild, Exercise limited by: None identified  Goals    . DIET - INCREASE WATER INTAKE     Recommend to drink at least 6-8 8oz glasses of water per day.       Fall Risk Fall Risk  12/26/2017 10/31/2016 08/01/2016 03/09/2016 11/10/2015  Falls in the past year? Yes No No No No  Comment fell on ice - - - -  Number falls in past yr: 1 - - - -  Injury with Fall? No - - - -  Follow up Falls evaluation completed;Education provided;Falls prevention discussed - - - -   Is the home free of loose throw rugs in walkways, pet beds, electrical cords, etc? Yes Adequate lighting to reduce risk of falls?  Yes In addition, does the patient have any of the following: Stairs in or around the home WITH handrails? N/A, single story home without stairs Grab bars in the bathroom? Yes  Shower chair or a place to sit while bathing? Yes Use of an elevated toilet seat or a handicapped toilet? Yes Use of a cane, walker or w/c? No  Timed Get Up and Go Performed: Yes. Pt ambulated 10 feet within 10 sec. Gait stead-fast and without the use of an assistive device. No intervention required at this time. Fall risk prevention has been discussed.  Community Resource Referral not required at this time.  Depression Screen PHQ 2/9 Scores 12/26/2017 10/31/2016 08/01/2016 03/09/2016  PHQ - 2 Score 1 0 0 0  PHQ- 9 Score 1 - - -     Cognitive Function     6CIT Screen 12/26/2017  What Year? 0 points  What month? 0 points  What time? 0 points  Count back from 20 0 points  Months in reverse 0 points  Repeat phrase 0 points  Total Score 0    Immunization History  Administered Date(s) Administered  . Tdap 08/08/2016    Qualifies for Shingles Vaccine? Yes. Due for Zostavax or Shingrix vaccine. Education has been provided regarding  the importance of this vaccine. Pt has been advised to call her insurance company to determine her out of pocket expense. Advised she may also receive this vaccine at her local pharmacy or Health Dept. Verbalized acceptance and understanding. States she does not receive vaccines, takes herbal supplements to help build her immunity.  Due for Flu and Pneumoccocal vaccines. Declined my offer to administer today. Education has been provided regarding the importance of this vaccine but still declined. States she does not receive vaccines, takes herbal supplements to help build her immunity.  Screening Tests Health Maintenance  Topic Date Due  . COLONOSCOPY  07/27/2001  . PNA vac Low Risk Adult (1 of 2 - PCV13) 12/27/2018 (Originally 07/27/2016)  . Hepatitis C Screening  08/15/2019 (Originally [birth year])  . INFLUENZA VACCINE  04/05/2018  . MAMMOGRAM  08/02/2018  . TETANUS/TDAP  08/08/2026  . DEXA SCAN  Completed    Cancer Screenings: Lung: Low Dose CT Chest recommended if Age 75-80 years, 30 pack-year currently smoking OR have quit w/in 15years. Patient does not qualify. Breast:  Up to date on Mammogram? Yes. Completed 07/07/17. Repeat every year   Up to date of Bone Density/Dexa? Yes. Completed 07/07/17. Osteoporotic screening no longer required Colorectal: Cologuard ordered 10/31/16 but unable to locate report. Pt states she has the supplies but just has not completed the test. Advised it is possible that her supplies may have expired. Advised she call the company to determine if a new box of supplies will be needed. Verbalized acceptance and understanding. New order placed today.  Additional Screenings: Hepatitis C Screening: Declined    Plan:  I have personally reviewed and addressed the Medicare Annual Wellness questionnaire and have noted the following in the patient's chart:  A. Medical and social history B. Use of alcohol, tobacco or illicit drugs  C. Current medications and  supplements D. Functional ability and status E.  Nutritional status F.  Physical activity G. Advance directives H. List of other physicians I.  Hospitalizations, surgeries, and ER visits in previous 12 months J.  [place] such as hearing and vision if needed, cognitive and depression L. Referrals and appointments  In addition, I have reviewed and discussed with patient certain preventive protocols, quality metrics, and best practice recommendations. A written personalized care plan for preventive services as well as general preventive health recommendations were provided to patient.  See attached scanned questionnaire for additional information.   Signed,  Aleatha Borer, LPN Nurse Health Advisor  I have reviewed this encounter including the documentation in this note and/or discussed this patient with the provider, Aleatha Borer, LPN. I am certifying that I agree with the content of this note as supervising physician.  [name], MD [facility] 12/26/2017, 1:24 PM

## 2017-12-27 LAB — HEMOGLOBIN A1C
HEMOGLOBIN A1C: 5.5 %{Hb} (ref <5.7)
Mean Plasma Glucose: 111 (calc)
eAG (mmol/L): 6.2 (calc)

## 2017-12-27 LAB — HEPATITIS C ANTIBODY
HEP C AB: NONREACTIVE
SIGNAL TO CUT-OFF: 0.02 (ref <1.00)

## 2017-12-27 LAB — PTH, INTACT AND CALCIUM
Calcium: 9.9 mg/dL (ref 8.6–10.4)
PTH: 29 pg/mL (ref 14–64)

## 2018-01-18 ENCOUNTER — Other Ambulatory Visit: Payer: Self-pay | Admitting: Family Medicine

## 2018-01-18 DIAGNOSIS — J4540 Moderate persistent asthma, uncomplicated: Secondary | ICD-10-CM

## 2018-03-11 NOTE — Progress Notes (Signed)
[place]  [phone] Fax:([phone]) [phone]  ID: Patricia Galloway OB: 1951-06-08  MR#: [id number]  EXH#:[id number]  Patient Care Team: [name], MD as PCP - General (Family Medicine) Bary Castilla, Forest Gleason, MD (General Surgery) Lloyd Huger, MD as Consulting Physician (Oncology) Dillingham, Loel Lofty, DO as Attending Physician (Plastic Surgery)  CHIEF COMPLAINT: Iron deficiency anemia, locally advanced basal cell carcinoma of the right leg.  INTERVAL HISTORY: Patient returns to clinic today for repeat laboratory work, further evaluation, and discussion of her imaging results.  She continues to feel well and remains asymptomatic. She has no neurologic complaints. She denies any recent fevers or illnesses. She has no chest pain or shortness of breath. She denies any nausea, vomiting, constipation, or diarrhea.  She has no melena or hematochezia.  She has no urinary complaints.  Patient feels at her baseline offers no specific complaints today.  REVIEW OF SYSTEMS:   Review of Systems  Constitutional: Negative for fever, malaise/fatigue and weight loss.  Respiratory: Negative.  Negative for cough and shortness of breath.   Cardiovascular: Negative.  Negative for chest pain and leg swelling.  Gastrointestinal: Negative.  Negative for abdominal pain, nausea and vomiting.  Genitourinary: Negative.  Negative for dysuria.  Musculoskeletal: Negative.  Negative for myalgias.  Skin: Negative.  Negative for rash.  Neurological: Negative.  Negative for sensory change, focal weakness and weakness.  Psychiatric/Behavioral: Negative.  The patient is not nervous/anxious.     As per HPI. Otherwise, a complete review of systems is negative.  PAST MEDICAL HISTORY: Past Medical History:  Diagnosis Date  . Anemia    has had iron infusion  . Anxiety    complicated grief  . Aortic aneurysm ([place])    found on CT in December, 2017  . Asthma    as child , but no  episodes in over 30 years  . Basal cell carcinoma   . Cancer ([place]) 08/2016   basal cell carcinoma - right leg  . History of transfusion of packed red blood cells     PAST SURGICAL HISTORY: Past Surgical History:  Procedure Laterality Date  . BASAL CELL CARCINOMA EXCISION Right 03/29/2017   Procedure: EXCISION OF RIGHT LEG BASEL CELL CARCINOMA;  Surgeon: [name], DO;  Location: [facility];  Service: Plastics;  Laterality: Right;  . COLON SURGERY  2002   colostomy for 10 months after a perfurated sigmoid   . COLOSTOMY REVERSAL  2003  . EMBOLIZATION Right 10/12/2016   Procedure: Embolization;  Surgeon: [name], MD;  Location: [facility];  Service: Cardiovascular;  Laterality: Right;  . etopic  3810,1751  . INCISION AND DRAINAGE OF WOUND Right 03/30/2017   Procedure: IRRIGATION AND DEBRIDEMENT WOUND; VAC PLACEMENT;  Surgeon: [name], DO;  Location: [facility];  Service: Plastics;  Laterality: Right;  . TONSILLECTOMY      FAMILY HISTORY: Family History  Problem Relation Age of Onset  . Dementia Mother   . Aortic stenosis Mother   . Osteoporosis Mother   . Dementia Father   . Diabetes Father   . Hyperlipidemia Father   . Hypertension Father   . Aneurysm Sister   . Hyperlipidemia Sister   . Hypertension Sister   . Diabetes Sister   . Breast cancer Neg Hx     ADVANCED DIRECTIVES (Y/N):  N  HEALTH MAINTENANCE: Social History   Tobacco Use  . Smoking status: Former Smoker    Packs/day: 0.50    Years:  25.00    Pack years: 12.50    Types: Cigarettes    Start date: 09/05/1972    Last attempt to quit: 08/03/1998    Years since quitting: 19.6  . Smokeless tobacco: Never Used  . Tobacco comment: smoking cessation materials not required  Substance Use Topics  . Alcohol use: No    Alcohol/week: 0.0 oz  . Drug use: No     Colonoscopy:  PAP:  Bone density:  Lipid panel:  Allergies  Allergen Reactions  . Penicillins Anaphylaxis    Has  patient had a PCN reaction causing immediate rash, facial/tongue/throat swelling, SOB or lightheadedness with hypotension: Yes Has patient had a PCN reaction causing severe rash involving mucus membranes or skin necrosis: No Has patient had a PCN reaction that required hospitalization: No Has patient had a PCN reaction occurring within the last 10 years: No If all of the above answers are "NO", then may proceed with Cephalosporin use.   . Codeine Nausea And Vomiting  . Latex Itching  . Sulfur Other (See Comments)    Reaction:  Redness/made skin bleed   . Tape Other (See Comments)    Reaction:  Blisters and burning  Pt states that paper tape is okay.    [address] Hcl] Anxiety    Current Outpatient Medications  Medication Sig Dispense Refill  . cholecalciferol (VITAMIN D) 1000 units tablet Take 1,000 Units by mouth 2 (two) times daily.    . diazepam (VALIUM) 5 MG tablet Take 1 tablet (5 mg total) by mouth daily as needed for anxiety. 30 minutes before doctor's visit 30 tablet 0  . Digestive Enzyme CAPS Take 1 capsule by mouth 3 (three) times daily after meals.     . fluticasone furoate-vilanterol (BREO ELLIPTA) 100-25 MCG/INH AEPB INHALE 1 PUFF BY MOUTH INTO THE LUNGS DAILY 60 each 5  . Ginkgo Biloba Extract (GNP GINGKO BILOBA EXTRACT) 60 MG CAPS Take 60 mg by mouth daily.     . magnesium 30 MG tablet Take 60 mg by mouth daily.     Marland Kitchen OVER THE COUNTER MEDICATION Take 1 tablet by mouth 2 (two) times daily. Mushroom Formula - Stamets 7    . OVER THE COUNTER MEDICATION Take 1 tablet by mouth 2 (two) times daily. Nutri-Calm    . Spirulina 500 MG TABS Take 500 mg by mouth daily.     Marland Kitchen UNABLE TO FIND Med Name: Alinda Dooms    . UNABLE TO FIND Med Name: liver care    . acidophilus (RISAQUAD) CAPS capsule Take 3 capsules by mouth at bedtime.    . Coenzyme Q10 (COQ10) 100 MG CAPS Take 100 mg by mouth 2 (two) times daily.    . Multiple Vitamin (MULTIVITAMIN) capsule Take 2 capsules by  mouth daily.     . VENTOLIN HFA 108 (90 Base) MCG/ACT inhaler TAKE 2 PUFFS BY MOUTH EVERY 6 HOURS AS NEEDED FOR WHEEZE OR SHORTNESS OF BREATH (Patient not taking: Reported on 03/15/2018) 18 Inhaler 0   No current facility-administered medications for this visit.     OBJECTIVE: Vitals:   03/15/18 1514  BP: (!) 172/100  Pulse: 100  Resp: 18  Temp: (!) 96 F (35.6 C)     Body mass index is 21.86 kg/m.    ECOG FS:0 - Asymptomatic  General: Well-developed, well-nourished, no acute distress. Eyes: Pink conjunctiva, anicteric sclera. HEENT: Normocephalic, moist mucous membranes, clear oropharnyx. Lungs: Clear to auscultation bilaterally. Heart: Regular rate and rhythm. No rubs, murmurs,  or gallops. Abdomen: Soft, nontender, nondistended. No organomegaly noted, normoactive bowel sounds. Musculoskeletal: No edema, cyanosis, or clubbing. Neuro: Alert, answering all questions appropriately. Cranial nerves grossly intact. Skin: No rashes or petechiae noted.  Well-healed surgical scar on right leg with significant deformity. Psych: Normal affect.  LAB RESULTS:  Lab Results  Component Value Date   NA 133 (L) 03/15/2018   K 3.5 03/15/2018   CL 100 03/15/2018   CO2 24 03/15/2018   GLUCOSE 113 (H) 03/15/2018   BUN 23 03/15/2018   CREATININE 0.76 03/15/2018   CALCIUM 8.8 (L) 03/15/2018   PROT 6.8 03/15/2018   ALBUMIN 3.9 03/15/2018   AST 18 03/15/2018   ALT 13 03/15/2018   ALKPHOS 54 03/15/2018   BILITOT 0.7 03/15/2018   GFRNONAA >60 03/15/2018   GFRAA >60 03/15/2018    Lab Results  Component Value Date   WBC 8.7 03/15/2018   NEUTROABS 5.8 03/15/2018   HGB 13.6 03/15/2018   HCT 39.4 03/15/2018   MCV 89.5 03/15/2018   PLT 215 03/15/2018   Lab Results  Component Value Date   IRON 71 01/09/2017   TIBC 225 (L) 01/09/2017   IRONPCTSAT 32 (H) 01/09/2017   Lab Results  Component Value Date   FERRITIN 58 01/09/2017    STUDIES: Ct Chest W Contrast  Result Date:  03/12/2018 CLINICAL DATA:  Right leg basal cell carcinoma treated with chemotherapy and surgery. Congestion. EXAM: CT CHEST WITH CONTRAST TECHNIQUE: Multidetector CT imaging of the chest was performed during intravenous contrast administration. CONTRAST:  63mL ISOVUE-300 IOPAMIDOL (ISOVUE-300) INJECTION 61% COMPARISON:  08/25/2016. FINDINGS: Cardiovascular: Atherosclerotic calcification of the arterial vasculature. Ascending aorta measures 4.2 cm. Heart is at the upper limits of normal in size. No pericardial effusion. Mediastinum/Nodes: No pathologically enlarged mediastinal or axillary lymph nodes. Right hilar lymph node measures 11 mm. Esophagus is grossly unremarkable. Lungs/Pleura: Moderate centrilobular emphysema. 7 mm (5 x 8 mm) nodule is seen in the apical portion of the left upper lobe (series 3, image 21) and is new from 08/25/2016. Pleuroparenchymal scarring the apex of the right upper lobe. 4 mm posterior segment right upper lobe nodule (image 65), also new. Vague ground-glass nodule with internal lucency in the right lower lobe, measuring 9 x 10 mm (image 69), questionably present on the prior. 7 mm (5 x 8 mm) nodule in the left upper lobe (series 3, image 40), is unchanged. No pleural fluid. Airway is unremarkable. Upper Abdomen: Visualized portions of the liver and gallbladder are unremarkable. Prominence of the common bile duct is unchanged. Visualized portions of the adrenal glands, kidneys and spleen are unremarkable. Calcifications are seen in the pancreas. Visualized portions of the stomach and bowel are grossly unremarkable. No upper abdominal adenopathy. Musculoskeletal: No worrisome lytic or sclerotic lesions. IMPRESSION: 1. Bilateral pulmonary nodules. Although some are new, metastatic disease is not favored. Rather, bronchogenic carcinoma should be excluded. Based on the largest solid nodule in the left upper lobe, typically a noncontrast chest CT at 3-6 months would be recommended. Then, if  the nodules are stable at time of repeat CT, then future CT at 18-24 months (from today's scan) is considered optional for low-risk patients, but is recommended for high-risk patients. However, given the patient's history of primary malignancy, a shorter interval follow-up may be clinically prudent. This recommendation follows the consensus statement: Guidelines for Management of Incidental Pulmonary Nodules Detected on CT Images: From the Fleischner Society 2017; Radiology 2017; 284:228-243. 2. Borderline enlarged right hilar lymph node, nonspecific. Attention  on follow-up is suggested. 3.  Aortic atherosclerosis (ICD10-170.0). 4. Ascending Aortic aneurysm NOS (ICD10-I71.9). Recommend annual imaging followup by CTA or MRA. This recommendation follows 2010 ACCF/AHA/AATS/ACR/ASA/SCA/SCAI/SIR/STS/SVM Guidelines for the Diagnosis and Management of Patients with Thoracic Aortic Disease. Circulation. 2010; 121: U725-D664. 5.  Emphysema (ICD10-J43.9). 6. Chronic calcific pancreatitis. Electronically Signed   By: Lorin Picket M.D.   On: 03/12/2018 13:43    ASSESSMENT: Iron deficiency anemia, locally advanced basal cell carcinoma of the right leg.  PLAN:    1. Iron deficiency anemia: Patient's hemoglobin and iron stores continue to be within normal limits.  She last received IV Feraheme on 5/26 2018.  No further interventions are needed.   2. Basal cell carcinoma of the right leg: Patient states this lesion grew for 9 years and she did not seek medical attention. Diagnosis confirmed by biopsy from surgery.  Patient underwent embolization as well as to neoadjuvant treatment with vismodegib (Erivedge)150 mg daily, a hedgehog inhibitor. Patient has now completed resection of her residual malignancy with clear margins and no obvious evidence of disease.  No further interventions are needed at this time.  Previously, adjuvant chemotherapy was discussed, but determined this was not necessary.  Patient also states she  would refuse any additional treatment.   3. Hypertension: Patient's blood pressure remains significantly elevated.  Continue monitoring and treatment per primary care. 4.  Pulmonary nodules: CT scan results reviewed independently and report as above with bilateral pulmonary nodules, some are "new".  No biopsy is needed, but will repeat CT scan in 3 months to assess for interval change.  Patient will return to clinic 1 to 2 days later to discuss the results.    Patient expressed understanding and was in agreement with this plan. She also understands that She can call clinic at any time with any questions, concerns, or complaints.   Cancer Staging Basal cell carcinoma, leg, right Staging form: Melanoma of the Skin, AJCC 7th Edition - Clinical: No stage assigned - Unsigned   Lloyd Huger, MD   03/20/2018 1:39 PM

## 2018-03-12 ENCOUNTER — Ambulatory Visit
Admission: RE | Admit: 2018-03-12 | Discharge: 2018-03-12 | Disposition: A | Discharge status: Home / Self Care | Payer: Medicare HMO | Source: Ambulatory Visit | Attending: Oncology | Admitting: Oncology

## 2018-03-12 DIAGNOSIS — I712 Thoracic aortic aneurysm, without rupture: Secondary | ICD-10-CM | POA: Diagnosis not present

## 2018-03-12 DIAGNOSIS — I700 Atherosclerosis of aorta: Secondary | ICD-10-CM | POA: Diagnosis not present

## 2018-03-12 DIAGNOSIS — C44712 Basal cell carcinoma of skin of right lower limb, including hip: Secondary | ICD-10-CM | POA: Diagnosis not present

## 2018-03-12 DIAGNOSIS — Z5111 Encounter for antineoplastic chemotherapy: Secondary | ICD-10-CM | POA: Diagnosis not present

## 2018-03-12 DIAGNOSIS — R911 Solitary pulmonary nodule: Secondary | ICD-10-CM | POA: Diagnosis present

## 2018-03-12 DIAGNOSIS — K861 Other chronic pancreatitis: Secondary | ICD-10-CM | POA: Diagnosis not present

## 2018-03-12 DIAGNOSIS — J439 Emphysema, unspecified: Secondary | ICD-10-CM | POA: Insufficient documentation

## 2018-03-12 DIAGNOSIS — R918 Other nonspecific abnormal finding of lung field: Secondary | ICD-10-CM | POA: Diagnosis not present

## 2018-03-12 LAB — POCT I-STAT CREATININE: Creatinine, Ser: 0.7 mg/dL (ref 0.44–1.00)

## 2018-03-12 MED ORDER — IOPAMIDOL (ISOVUE-300) INJECTION 61%
75.0000 mL | Freq: Once | INTRAVENOUS | Status: AC | PRN
  Administered 2018-03-12: 75 mL via INTRAVENOUS

## 2018-03-15 ENCOUNTER — Inpatient Hospital Stay (HOSPITAL_BASED_OUTPATIENT_CLINIC_OR_DEPARTMENT_OTHER): Payer: Medicare HMO | Admitting: Oncology

## 2018-03-15 ENCOUNTER — Other Ambulatory Visit: Payer: Self-pay

## 2018-03-15 ENCOUNTER — Inpatient Hospital Stay: Payer: Medicare HMO | Attending: Oncology

## 2018-03-15 VITALS — BP 172/100 | HR 100 | Temp 96.0°F | Resp 18 | Wt 123.4 lb

## 2018-03-15 DIAGNOSIS — C44712 Basal cell carcinoma of skin of right lower limb, including hip: Secondary | ICD-10-CM

## 2018-03-15 DIAGNOSIS — R918 Other nonspecific abnormal finding of lung field: Secondary | ICD-10-CM | POA: Diagnosis not present

## 2018-03-15 DIAGNOSIS — D509 Iron deficiency anemia, unspecified: Secondary | ICD-10-CM | POA: Insufficient documentation

## 2018-03-15 DIAGNOSIS — Z87891 Personal history of nicotine dependence: Secondary | ICD-10-CM

## 2018-03-15 DIAGNOSIS — D500 Iron deficiency anemia secondary to blood loss (chronic): Secondary | ICD-10-CM

## 2018-03-15 LAB — CBC WITH DIFFERENTIAL/PLATELET
BASOS ABS: 0.1 10*3/uL (ref 0–0.1)
Basophils Relative: 1 %
EOS ABS: 0.3 10*3/uL (ref 0–0.7)
EOS PCT: 3 %
HCT: 39.4 % (ref 35.0–47.0)
Hemoglobin: 13.6 g/dL (ref 12.0–16.0)
LYMPHS PCT: 16 %
Lymphs Abs: 1.4 10*3/uL (ref 1.0–3.6)
MCH: 30.8 pg (ref 26.0–34.0)
MCHC: 34.5 g/dL (ref 32.0–36.0)
MCV: 89.5 fL (ref 80.0–100.0)
Monocytes Absolute: 1.1 10*3/uL — ABNORMAL HIGH (ref 0.2–0.9)
Monocytes Relative: 13 %
Neutro Abs: 5.8 10*3/uL (ref 1.4–6.5)
Neutrophils Relative %: 67 %
PLATELETS: 215 10*3/uL (ref 150–440)
RBC: 4.4 MIL/uL (ref 3.80–5.20)
RDW: 13.5 % (ref 11.5–14.5)
WBC: 8.7 10*3/uL (ref 3.6–11.0)

## 2018-03-15 LAB — COMPREHENSIVE METABOLIC PANEL
ALT: 13 U/L (ref 0–44)
AST: 18 U/L (ref 15–41)
Albumin: 3.9 g/dL (ref 3.5–5.0)
Alkaline Phosphatase: 54 U/L (ref 38–126)
Anion gap: 9 (ref 5–15)
BUN: 23 mg/dL (ref 8–23)
CO2: 24 mmol/L (ref 22–32)
CREATININE: 0.76 mg/dL (ref 0.44–1.00)
Calcium: 8.8 mg/dL — ABNORMAL LOW (ref 8.9–10.3)
Chloride: 100 mmol/L (ref 98–111)
GFR calc Af Amer: >60 mL/min (ref >60)
Glucose, Bld: 113 mg/dL — ABNORMAL HIGH (ref 70–99)
POTASSIUM: 3.5 mmol/L (ref 3.5–5.1)
Sodium: 133 mmol/L — ABNORMAL LOW (ref 135–145)
TOTAL PROTEIN: 6.8 g/dL (ref 6.5–8.1)
Total Bilirubin: 0.7 mg/dL (ref 0.3–1.2)

## 2018-03-15 LAB — MAGNESIUM: Magnesium: 2.1 mg/dL (ref 1.7–2.4)

## 2018-03-15 NOTE — Progress Notes (Signed)
Here for follow up. Overall stated " Im doing great "

## 2018-04-04 DIAGNOSIS — Z87891 Personal history of nicotine dependence: Secondary | ICD-10-CM | POA: Diagnosis not present

## 2018-04-04 DIAGNOSIS — M810 Age-related osteoporosis without current pathological fracture: Secondary | ICD-10-CM | POA: Diagnosis not present

## 2018-06-20 ENCOUNTER — Ambulatory Visit
Admission: RE | Admit: 2018-06-20 | Discharge: 2018-06-20 | Disposition: A | Discharge status: Home / Self Care | Payer: Medicare HMO | Source: Ambulatory Visit | Attending: Oncology | Admitting: Oncology

## 2018-06-20 DIAGNOSIS — J432 Centrilobular emphysema: Secondary | ICD-10-CM | POA: Insufficient documentation

## 2018-06-20 DIAGNOSIS — I700 Atherosclerosis of aorta: Secondary | ICD-10-CM | POA: Insufficient documentation

## 2018-06-20 DIAGNOSIS — I2510 Atherosclerotic heart disease of native coronary artery without angina pectoris: Secondary | ICD-10-CM | POA: Insufficient documentation

## 2018-06-20 DIAGNOSIS — R918 Other nonspecific abnormal finding of lung field: Secondary | ICD-10-CM

## 2018-06-20 DIAGNOSIS — J439 Emphysema, unspecified: Secondary | ICD-10-CM | POA: Diagnosis not present

## 2018-06-20 LAB — POCT I-STAT CREATININE: Creatinine, Ser: 0.7 mg/dL (ref 0.44–1.00)

## 2018-06-20 MED ORDER — IOHEXOL 300 MG/ML  SOLN
75.0000 mL | Freq: Once | INTRAMUSCULAR | Status: AC | PRN
  Administered 2018-06-20: 75 mL via INTRAVENOUS

## 2018-06-24 NOTE — Progress Notes (Signed)
[place]  [phone] [phone]  ID: Patricia Galloway OB: June 03, 1951  MR#: [id number]  TGG#:[id number]  Patient Care Team: [name], MD as PCP - General (Family Medicine) Bary Castilla, Forest Gleason, MD (General Surgery) Lloyd Huger, MD as Consulting Physician (Oncology) Dillingham, Loel Lofty, DO as Attending Physician (Plastic Surgery)  CHIEF COMPLAINT: Iron deficiency anemia, locally advanced basal cell carcinoma of the right leg.  INTERVAL HISTORY: Patient returns today for further evaluation and discussion of her imaging results.  She continues to feel well and remains asymptomatic. She has no neurologic complaints. She denies any recent fevers or illnesses. She has no chest pain or shortness of breath. She denies any nausea, vomiting, constipation, or diarrhea.  She has no melena or hematochezia.  She has no urinary complaints.  Patient offers no specific complaints today.  REVIEW OF SYSTEMS:   Review of Systems  Constitutional: Negative for fever, malaise/fatigue and weight loss.  Respiratory: Negative.  Negative for cough and shortness of breath.   Cardiovascular: Negative.  Negative for chest pain and leg swelling.  Gastrointestinal: Negative.  Negative for abdominal pain, nausea and vomiting.  Genitourinary: Negative.  Negative for dysuria.  Musculoskeletal: Negative.  Negative for myalgias.  Skin: Negative.  Negative for rash.  Neurological: Negative.  Negative for sensory change, focal weakness and weakness.  Psychiatric/Behavioral: Negative.  The patient is not nervous/anxious.     As per HPI. Otherwise, a complete review of systems is negative.  PAST MEDICAL HISTORY: Past Medical History:  Diagnosis Date  . Anemia    has had iron infusion  . Anxiety    complicated grief  . Aortic aneurysm ([place])    found on CT in December, 2017  . Asthma    as child , but no episodes in over 30 years  . Basal cell carcinoma   .  Cancer ([place]) 08/2016   basal cell carcinoma - right leg  . History of transfusion of packed red blood cells     PAST SURGICAL HISTORY: Past Surgical History:  Procedure Laterality Date  . BASAL CELL CARCINOMA EXCISION Right 03/29/2017   Procedure: EXCISION OF RIGHT LEG BASEL CELL CARCINOMA;  Surgeon: [name], DO;  Location: [facility];  Service: Plastics;  Laterality: Right;  . COLON SURGERY  2002   colostomy for 10 months after a perfurated sigmoid   . COLOSTOMY REVERSAL  2003  . EMBOLIZATION Right 10/12/2016   Procedure: Embolization;  Surgeon: [name], MD;  Location: [facility];  Service: Cardiovascular;  Laterality: Right;  . etopic  7035,0093  . INCISION AND DRAINAGE OF WOUND Right 03/30/2017   Procedure: IRRIGATION AND DEBRIDEMENT WOUND; VAC PLACEMENT;  Surgeon: [name], DO;  Location: [facility];  Service: Plastics;  Laterality: Right;  . TONSILLECTOMY      FAMILY HISTORY: Family History  Problem Relation Age of Onset  . Dementia Mother   . Aortic stenosis Mother   . Osteoporosis Mother   . Dementia Father   . Diabetes Father   . Hyperlipidemia Father   . Hypertension Father   . Aneurysm Sister   . Hyperlipidemia Sister   . Hypertension Sister   . Diabetes Sister   . Breast cancer Neg Hx     ADVANCED DIRECTIVES (Y/N):  N  HEALTH MAINTENANCE: Social History   Tobacco Use  . Smoking status: Former Smoker    Packs/day: 0.50    Years: 25.00    Pack years: 12.50  Types: Cigarettes    Start date: 09/05/1972    Last attempt to quit: 08/03/1998    Years since quitting: 19.9  . Smokeless tobacco: Never Used  . Tobacco comment: smoking cessation materials not required  Substance Use Topics  . Alcohol use: No    Alcohol/week: 0.0 standard drinks  . Drug use: No     Colonoscopy:  PAP:  Bone density:  Lipid panel:  Allergies  Allergen Reactions  . Penicillins Anaphylaxis    Has patient had a PCN reaction causing immediate  rash, facial/tongue/throat swelling, SOB or lightheadedness with hypotension: Yes Has patient had a PCN reaction causing severe rash involving mucus membranes or skin necrosis: No Has patient had a PCN reaction that required hospitalization: No Has patient had a PCN reaction occurring within the last 10 years: No If all of the above answers are "NO", then may proceed with Cephalosporin use.   . Codeine Nausea And Vomiting  . Latex Itching  . Sulfur Other (See Comments)    Reaction:  Redness/made skin bleed   . Tape Other (See Comments)    Reaction:  Blisters and burning  Pt states that paper tape is okay.    [address] Hcl] Anxiety    Current Outpatient Medications  Medication Sig Dispense Refill  . acidophilus (RISAQUAD) CAPS capsule Take 3 capsules by mouth at bedtime.    . cholecalciferol (VITAMIN D) 1000 units tablet Take 1,000 Units by mouth 2 (two) times daily.    . Coenzyme Q10 (COQ10) 100 MG CAPS Take 100 mg by mouth 2 (two) times daily.    . diazepam (VALIUM) 5 MG tablet Take 1 tablet (5 mg total) by mouth daily as needed for anxiety. 30 minutes before doctor's visit 30 tablet 0  . Digestive Enzyme CAPS Take 1 capsule by mouth 3 (three) times daily after meals.     . fluticasone furoate-vilanterol (BREO ELLIPTA) 100-25 MCG/INH AEPB INHALE 1 PUFF BY MOUTH INTO THE LUNGS DAILY 60 each 5  . Ginkgo Biloba Extract (GNP GINGKO BILOBA EXTRACT) 60 MG CAPS Take 60 mg by mouth daily.     . magnesium 30 MG tablet Take 60 mg by mouth daily.     . Multiple Vitamin (MULTIVITAMIN) capsule Take 2 capsules by mouth daily.     Marland Kitchen OVER THE COUNTER MEDICATION Take 1 tablet by mouth 2 (two) times daily. Mushroom Formula - Stamets 7    . OVER THE COUNTER MEDICATION Take 1 tablet by mouth 2 (two) times daily. Nutri-Calm    . Spirulina 500 MG TABS Take 500 mg by mouth daily.     Marland Kitchen UNABLE TO FIND Med Name: Alinda Dooms    . UNABLE TO FIND Med Name: liver care    . VENTOLIN HFA 108 (90 Base)  MCG/ACT inhaler TAKE 2 PUFFS BY MOUTH EVERY 6 HOURS AS NEEDED FOR WHEEZE OR SHORTNESS OF BREATH 18 Inhaler 0   No current facility-administered medications for this visit.     OBJECTIVE: Vitals:   06/28/18 1405 06/28/18 1413  BP:  (!) 181/101  Pulse:  93  Resp: 16   Temp:  97.9 F (36.6 C)     Body mass index is 21.79 kg/m.    ECOG FS:0 - Asymptomatic  General: Well-developed, well-nourished, no acute distress. Eyes: Pink conjunctiva, anicteric sclera. HEENT: Normocephalic, moist mucous membranes. Lungs: Clear to auscultation bilaterally. Heart: Regular rate and rhythm. No rubs, murmurs, or gallops. Abdomen: Soft, nontender, nondistended. No organomegaly noted, normoactive bowel sounds.  Musculoskeletal: No edema, cyanosis, or clubbing. Neuro: Alert, answering all questions appropriately. Cranial nerves grossly intact. Skin: No rashes or petechiae noted.  Well-healed surgical scar on right leg with significant deformity. Psych: Normal affect.  LAB RESULTS:  Lab Results  Component Value Date   NA 137 06/28/2018   K 3.7 06/28/2018   CL 103 06/28/2018   CO2 26 06/28/2018   GLUCOSE 129 (H) 06/28/2018   BUN 24 (H) 06/28/2018   CREATININE 0.84 06/28/2018   CALCIUM 9.4 06/28/2018   PROT 7.1 06/28/2018   ALBUMIN 4.3 06/28/2018   AST 23 06/28/2018   ALT 16 06/28/2018   ALKPHOS 61 06/28/2018   BILITOT 0.6 06/28/2018   GFRNONAA >60 06/28/2018   GFRAA >60 06/28/2018    Lab Results  Component Value Date   WBC 8.1 06/28/2018   NEUTROABS 4.9 06/28/2018   HGB 13.2 06/28/2018   HCT 39.1 06/28/2018   MCV 86.7 06/28/2018   PLT 258 06/28/2018   Lab Results  Component Value Date   IRON 71 01/09/2017   TIBC 225 (L) 01/09/2017   IRONPCTSAT 32 (H) 01/09/2017   Lab Results  Component Value Date   FERRITIN 58 01/09/2017    STUDIES: Ct Chest W Contrast  Result Date: 06/20/2018 CLINICAL DATA:  67 year old female with history of constant congestion since carbon monoxide  exposure. Shortness of breath. EXAM: CT CHEST WITH CONTRAST TECHNIQUE: Multidetector CT imaging of the chest was performed during intravenous contrast administration. CONTRAST:  70mL OMNIPAQUE IOHEXOL 300 MG/ML  SOLN COMPARISON:  Chest CT 03/12/2018. FINDINGS: Cardiovascular: Heart size is normal. There is no significant pericardial fluid, thickening or pericardial calcification. There is aortic atherosclerosis, as well as atherosclerosis of the great vessels of the mediastinum and the coronary arteries, including calcified atherosclerotic plaque in the left main, left anterior descending and right coronary arteries. Thickening calcification of the aortic valve. Mediastinum/Nodes: No pathologically enlarged mediastinal or hilar lymph nodes. Esophagus is unremarkable in appearance. No axillary lymphadenopathy. Lungs/Pleura: 8 x 5 mm nodule in the medial aspect of the left upper lobe near the apex (axial image 14 of series 3), stable compared to the recent prior chest CT 03/12/2014. 8 x 5 mm left upper lobe nodule (axial image 34 of series 3), also unchanged. 4 mm right upper lobe nodule (axial image 57 of series 3), stable. 11 x 9 mm sub solid nodular area of architectural distortion with internal lucencies in the right lower lobe (axial image 62 of series 3), unchanged. No other suspicious appearing pulmonary nodules or masses are noted. No acute consolidative airspace disease. No pleural effusions. Diffuse bronchial wall thickening with moderate centrilobular emphysema. Upper Abdomen: Subcentimeter low-attenuation lesion in segment 2 of the liver, incompletely characterized on today's noncontrast CT examination, but stable compared to the prior study, statistically likely a cyst. Aortic atherosclerosis. Musculoskeletal: There are no aggressive appearing lytic or blastic lesions noted in the visualized portions of the skeleton. IMPRESSION: 1. All previously noted pulmonary nodules appear stable in size and number  compared to the recent prior examination. This is reassuring, however, these warrant continued attention on follow-up studies. Repeat chest CT is now recommended in 12 months. This recommendation follows the consensus statement: Guidelines for Management of Incidental Pulmonary Nodules Detected on CT Images: From the Fleischner Society 2017; Radiology 2017; 284:228-243. 2. Diffuse bronchial wall thickening with moderate centrilobular emphysema; imaging findings suggestive of underlying COPD. 3. Aortic atherosclerosis, in addition to left main and 2 vessel coronary artery disease. Please note that although  the presence of coronary artery calcium documents the presence of coronary artery disease, the severity of this disease and any potential stenosis cannot be assessed on this non-gated CT examination. Assessment for potential risk factor modification, dietary therapy or pharmacologic therapy may be warranted, if clinically indicated. 4. There is thickening calcification of the aortic valve. Echocardiographic correlation for evaluation of potential valvular dysfunction may be warranted if clinically indicated. Aortic Atherosclerosis (ICD10-I70.0) and Emphysema (ICD10-J43.9). Electronically Signed   By: Vinnie Langton M.D.   On: 06/20/2018 12:45    ASSESSMENT: Iron deficiency anemia, locally advanced basal cell carcinoma of the right leg.  PLAN:    1. Iron deficiency anemia: Resolved.  Patient's hemoglobin iron stores continue to be within normal limits.  Patient last received Feraheme on Jan 28, 2017.     2. Basal cell carcinoma of the right leg: Resolved without evidence of recurrence.  Patient states this lesion grew for 9 years and she did not seek medical attention. Diagnosis confirmed by biopsy from surgery.  Patient underwent embolization as well as to neoadjuvant treatment with vismodegib (Erivedge)150 mg daily, a hedgehog inhibitor. Patient underwent complete resection of residual malignancy on March 29, 2017.  Previously, adjuvant chemotherapy and XRT was discussed, but given widely clear margins on surgical resections this was determined not to be necessary.  No further interventions are needed at this time.   3. Hypertension: Patient's blood pressure remains significantly elevated.  Continue monitoring and treatment per primary care. 4.  Pulmonary nodules: CT scan results from June 20, 2018 reviewed independently and report as above with stable pulmonary nodules.  Recommendation is to repeat CT scan in 12 months. 5.  Disposition: Return to clinic in 6 months for routine evaluation.    Patient expressed understanding and was in agreement with this plan. She also understands that She can call clinic at any time with any questions, concerns, or complaints.   Cancer Staging Basal cell carcinoma, leg, right Staging form: Melanoma of the Skin, AJCC 7th Edition - Clinical: No stage assigned - Unsigned   Lloyd Huger, MD   07/03/2018 6:18 AM

## 2018-06-26 ENCOUNTER — Other Ambulatory Visit: Payer: Self-pay | Admitting: *Deleted

## 2018-06-26 DIAGNOSIS — C4491 Basal cell carcinoma of skin, unspecified: Secondary | ICD-10-CM

## 2018-06-28 ENCOUNTER — Inpatient Hospital Stay: Payer: Medicare HMO | Attending: Oncology

## 2018-06-28 ENCOUNTER — Other Ambulatory Visit: Payer: Self-pay

## 2018-06-28 ENCOUNTER — Encounter: Payer: Self-pay | Admitting: Oncology

## 2018-06-28 ENCOUNTER — Inpatient Hospital Stay (HOSPITAL_BASED_OUTPATIENT_CLINIC_OR_DEPARTMENT_OTHER): Payer: Medicare HMO | Admitting: Oncology

## 2018-06-28 VITALS — BP 181/101 | HR 93 | Temp 97.9°F | Resp 16 | Ht 63.0 in | Wt 123.0 lb

## 2018-06-28 DIAGNOSIS — I10 Essential (primary) hypertension: Secondary | ICD-10-CM | POA: Diagnosis not present

## 2018-06-28 DIAGNOSIS — Z85828 Personal history of other malignant neoplasm of skin: Secondary | ICD-10-CM | POA: Insufficient documentation

## 2018-06-28 DIAGNOSIS — C44712 Basal cell carcinoma of skin of right lower limb, including hip: Secondary | ICD-10-CM

## 2018-06-28 DIAGNOSIS — D509 Iron deficiency anemia, unspecified: Secondary | ICD-10-CM

## 2018-06-28 DIAGNOSIS — C4491 Basal cell carcinoma of skin, unspecified: Secondary | ICD-10-CM

## 2018-06-28 DIAGNOSIS — R911 Solitary pulmonary nodule: Secondary | ICD-10-CM

## 2018-06-28 LAB — CBC WITH DIFFERENTIAL/PLATELET
ABS IMMATURE GRANULOCYTES: 0.02 10*3/uL (ref 0.00–0.07)
BASOS PCT: 1 %
Basophils Absolute: 0.1 10*3/uL (ref 0.0–0.1)
EOS PCT: 3 %
Eosinophils Absolute: 0.2 10*3/uL (ref 0.0–0.5)
HCT: 39.1 % (ref 36.0–46.0)
Hemoglobin: 13.2 g/dL (ref 12.0–15.0)
Immature Granulocytes: 0 %
Lymphocytes Relative: 27 %
Lymphs Abs: 2.2 10*3/uL (ref 0.7–4.0)
MCH: 29.3 pg (ref 26.0–34.0)
MCHC: 33.8 g/dL (ref 30.0–36.0)
MCV: 86.7 fL (ref 80.0–100.0)
MONO ABS: 0.7 10*3/uL (ref 0.1–1.0)
MONOS PCT: 9 %
NEUTROS ABS: 4.9 10*3/uL (ref 1.7–7.7)
Neutrophils Relative %: 60 %
PLATELETS: 258 10*3/uL (ref 150–400)
RBC: 4.51 MIL/uL (ref 3.87–5.11)
RDW: 13.3 % (ref 11.5–15.5)
WBC: 8.1 10*3/uL (ref 4.0–10.5)
nRBC: 0 % (ref 0.0–0.2)

## 2018-06-28 LAB — COMPREHENSIVE METABOLIC PANEL
ALK PHOS: 61 U/L (ref 38–126)
ALT: 16 U/L (ref 0–44)
ANION GAP: 8 (ref 5–15)
AST: 23 U/L (ref 15–41)
Albumin: 4.3 g/dL (ref 3.5–5.0)
BUN: 24 mg/dL — ABNORMAL HIGH (ref 8–23)
CALCIUM: 9.4 mg/dL (ref 8.9–10.3)
CO2: 26 mmol/L (ref 22–32)
Chloride: 103 mmol/L (ref 98–111)
Creatinine, Ser: 0.84 mg/dL (ref 0.44–1.00)
Glucose, Bld: 129 mg/dL — ABNORMAL HIGH (ref 70–99)
Potassium: 3.7 mmol/L (ref 3.5–5.1)
Sodium: 137 mmol/L (ref 135–145)
Total Bilirubin: 0.6 mg/dL (ref 0.3–1.2)
Total Protein: 7.1 g/dL (ref 6.5–8.1)

## 2018-06-28 LAB — MAGNESIUM: MAGNESIUM: 2.1 mg/dL (ref 1.7–2.4)

## 2018-06-28 NOTE — Progress Notes (Signed)
Patient here for results. 

## 2018-07-04 NOTE — Progress Notes (Signed)
LVM for pt to call the office to schedule appt for BP fu

## 2018-08-11 ENCOUNTER — Encounter: Payer: Self-pay | Admitting: Family Medicine

## 2018-08-11 ENCOUNTER — Other Ambulatory Visit: Payer: Self-pay | Admitting: Family Medicine

## 2018-08-11 DIAGNOSIS — J4540 Moderate persistent asthma, uncomplicated: Secondary | ICD-10-CM

## 2018-08-13 ENCOUNTER — Other Ambulatory Visit: Payer: Self-pay | Admitting: Family Medicine

## 2018-08-13 DIAGNOSIS — J4540 Moderate persistent asthma, uncomplicated: Secondary | ICD-10-CM

## 2018-08-13 MED ORDER — FLUTICASONE FUROATE-VILANTEROL 100-25 MCG/INH IN AEPB: INHALATION_SPRAY | RESPIRATORY_TRACT | 0 refills | Status: DC

## 2018-08-16 ENCOUNTER — Ambulatory Visit (INDEPENDENT_AMBULATORY_CARE_PROVIDER_SITE_OTHER): Payer: Medicare HMO | Admitting: Family Medicine

## 2018-08-16 ENCOUNTER — Encounter: Payer: Self-pay | Admitting: Family Medicine

## 2018-08-16 ENCOUNTER — Other Ambulatory Visit (HOSPITAL_COMMUNITY)
Admission: RE | Admit: 2018-08-16 | Discharge: 2018-08-16 | Disposition: A | Discharge status: Home / Self Care | Payer: Medicare HMO | Source: Ambulatory Visit | Attending: Family Medicine | Admitting: Family Medicine

## 2018-08-16 VITALS — BP 180/110 | HR 115 | Temp 97.5°F | Resp 16 | Ht 63.0 in | Wt 125.3 lb

## 2018-08-16 DIAGNOSIS — J4540 Moderate persistent asthma, uncomplicated: Secondary | ICD-10-CM

## 2018-08-16 DIAGNOSIS — Z124 Encounter for screening for malignant neoplasm of cervix: Secondary | ICD-10-CM | POA: Insufficient documentation

## 2018-08-16 DIAGNOSIS — Z9889 Other specified postprocedural states: Secondary | ICD-10-CM | POA: Diagnosis not present

## 2018-08-16 DIAGNOSIS — F320 Major depressive disorder, single episode, mild: Secondary | ICD-10-CM

## 2018-08-16 DIAGNOSIS — J449 Chronic obstructive pulmonary disease, unspecified: Secondary | ICD-10-CM | POA: Diagnosis not present

## 2018-08-16 DIAGNOSIS — M810 Age-related osteoporosis without current pathological fracture: Secondary | ICD-10-CM

## 2018-08-16 DIAGNOSIS — R911 Solitary pulmonary nodule: Secondary | ICD-10-CM

## 2018-08-16 DIAGNOSIS — R030 Elevated blood-pressure reading, without diagnosis of hypertension: Secondary | ICD-10-CM | POA: Diagnosis not present

## 2018-08-16 DIAGNOSIS — Z85828 Personal history of other malignant neoplasm of skin: Secondary | ICD-10-CM

## 2018-08-16 DIAGNOSIS — I700 Atherosclerosis of aorta: Secondary | ICD-10-CM | POA: Diagnosis not present

## 2018-08-16 DIAGNOSIS — J4489 Other specified chronic obstructive pulmonary disease: Secondary | ICD-10-CM

## 2018-08-16 DIAGNOSIS — R69 Illness, unspecified: Secondary | ICD-10-CM | POA: Diagnosis not present

## 2018-08-16 MED ORDER — FLUTICASONE FUROATE-VILANTEROL 100-25 MCG/INH IN AEPB: INHALATION_SPRAY | RESPIRATORY_TRACT | 1 refills | Status: DC

## 2018-08-16 MED ORDER — UMECLIDINIUM BROMIDE 62.5 MCG/INH IN AEPB: 1.0000 | INHALATION_SPRAY | Freq: Every day | RESPIRATORY_TRACT | 2 refills | Status: DC

## 2018-08-16 NOTE — Addendum Note (Signed)
Addended by: [name] F on: 08/16/2018 01:42 PM   Modules accepted: Orders

## 2018-08-16 NOTE — Progress Notes (Addendum)
Name: Patricia Galloway   MRN: [id number]    DOB: 12-22-1950   Date:08/16/2018       Progress Note  Subjective  Chief Complaint  Chief Complaint  Patient presents with  . Asthma  . COPD    HPI  Basal cell carcinoma right leg: she is doing well, s/p removal and chemotherapy. Her gait is much better now.   White coat HTN: she does not want medication at this time, bp during pulmonary rehab was always normal, reviewed logs with patient today. She states that bp goes up when she gets to doctor's visits. BP was not as high when she saw LPN for medicare well. Advised to take valium half pill 30 minutes prior to office visit  Major Depression in remission: recently feeling more overwhelmed, husband getting treated for kidney cancer, had surgery, car broke down, plumbing issues and had a panic attack about 5 weeks ago. She talked to her husband, took valium and symptoms improved.   Asthma/COPD: using Breo daily, but states seems like something in her house is triggering her symptoms. She needs refill of medication. She has a basement and has some water problems, she may need to have her house tested for mold. She states she has been using Breo in am but is wheezing in the afternoon.  She had CT chest done recently by Dr. Grayland Ormond. She has daily productive cough. Discussed spirometry but she states she cannot tolerate Albuterol , she is not sure if she wants to see pulmonologist or add medication, but states prefer trying medication first .   Atherosclerosis aorta: refuses statin therapy, discussed baby aspirin on her last visit, but still not taking it.   Osteoporosis: discussed options, she is willing to see Endocrinologist Discussed Prolia and Forteo, bone density done 07/2017.    Patient Active Problem List   Diagnosis Date Noted  . Protein-calorie malnutrition, severe 03/31/2017  . Pain in limb 03/29/2017  . Basal cell carcinoma 03/29/2017  . Thoracic aortic aneurysm without rupture  ([place]) 10/31/2016  . Atherosclerosis of aorta ([place]) 10/31/2016  . Chronic pancreatitis ([place]) 10/31/2016  . Pulmonary nodule 10/31/2016  . Centrilobular emphysema ([place]) 10/31/2016  . Iron deficiency anemia due to chronic blood loss 10/24/2016  . Basal cell carcinoma, leg, right 08/24/2016  . Hyperglycemia 08/01/2016  . Chronic thoracic back pain 08/04/2015  . Panic attack 08/04/2015  . Complicated grieving 25/42/7062  . White coat syndrome without diagnosis of hypertension 08/04/2015  . Major depression in remission ([place]) 08/04/2015  . Asthma, moderate persistent, poorly-controlled 08/04/2015    Past Surgical History:  Procedure Laterality Date  . BASAL CELL CARCINOMA EXCISION Right 03/29/2017   Procedure: EXCISION OF RIGHT LEG BASEL CELL CARCINOMA;  Surgeon: [name], DO;  Location: [facility];  Service: Plastics;  Laterality: Right;  . COLON SURGERY  2002   colostomy for 10 months after a perfurated sigmoid   . COLOSTOMY REVERSAL  2003  . EMBOLIZATION Right 10/12/2016   Procedure: Embolization;  Surgeon: [name], MD;  Location: [facility];  Service: Cardiovascular;  Laterality: Right;  . etopic  3762,8315  . INCISION AND DRAINAGE OF WOUND Right 03/30/2017   Procedure: IRRIGATION AND DEBRIDEMENT WOUND; VAC PLACEMENT;  Surgeon: [name], DO;  Location: [facility];  Service: Plastics;  Laterality: Right;  . TONSILLECTOMY      Family History  Problem Relation Age of Onset  . Dementia Mother   . Aortic stenosis Mother   . Osteoporosis Mother   .  Dementia Father   . Diabetes Father   . Hyperlipidemia Father   . Hypertension Father   . Aneurysm Sister   . Hyperlipidemia Sister   . Hypertension Sister   . Diabetes Sister   . Breast cancer Neg Hx     Social History   Socioeconomic History  . Marital status: Married    Spouse name: [name]  . Number of children: 0  . Years of education: Not on file  . Highest education level: Bachelor's degree  (e.g., BA, AB, BS)  Occupational History  . Occupation: Retired  Scientific laboratory technician  . Financial resource strain: Not hard at all  . Food insecurity:    Worry: Never true    Inability: Never true  . Transportation needs:    Medical: No    Non-medical: No  Tobacco Use  . Smoking status: Former Smoker    Packs/day: 0.50    Years: 25.00    Pack years: 12.50    Types: Cigarettes    Start date: 09/05/1972    Last attempt to quit: 08/03/1998    Years since quitting: 20.0  . Smokeless tobacco: Never Used  . Tobacco comment: smoking cessation materials not required  Substance and Sexual Activity  . Alcohol use: No    Alcohol/week: 0.0 standard drinks  . Drug use: No  . Sexual activity: Not Currently    Birth control/protection: Post-menopausal  Lifestyle  . Physical activity:    Days per week: 2 days    Minutes per session: 60 min  . Stress: Only a little  Relationships  . Social connections:    Talks on phone: Patient refused    Gets together: Patient refused    Attends religious service: Patient refused    Active member of club or organization: Patient refused    Attends meetings of clubs or organizations: Patient refused    Relationship status: Married  . Intimate partner violence:    Fear of current or ex partner: No    Emotionally abused: No    Physically abused: No    Forced sexual activity: No  Other Topics Concern  . Not on file  Social History Narrative  . Not on file     Current Outpatient Medications:  .  acidophilus (RISAQUAD) CAPS capsule, Take 3 capsules by mouth at bedtime., Disp: , Rfl:  .  cholecalciferol (VITAMIN D) 1000 units tablet, Take 1,000 Units by mouth 2 (two) times daily., Disp: , Rfl:  .  Coenzyme Q10 (COQ10) 100 MG CAPS, Take 100 mg by mouth 2 (two) times daily., Disp: , Rfl:  .  diazepam (VALIUM) 5 MG tablet, Take 1 tablet (5 mg total) by mouth daily as needed for anxiety. 30 minutes before doctor's visit, Disp: 30 tablet, Rfl: 0 .  Digestive  Enzyme CAPS, Take 1 capsule by mouth 3 (three) times daily after meals. , Disp: , Rfl:  .  fluticasone furoate-vilanterol (BREO ELLIPTA) 100-25 MCG/INH AEPB, INHALE 1 PUFF BY MOUTH INTO THE LUNGS DAILY, Disp: 60 each, Rfl: 0 .  Ginkgo Biloba Extract (GNP GINGKO BILOBA EXTRACT) 60 MG CAPS, Take 60 mg by mouth daily. , Disp: , Rfl:  .  magnesium 30 MG tablet, Take 60 mg by mouth daily. , Disp: , Rfl:  .  Multiple Vitamin (MULTIVITAMIN) capsule, Take 2 capsules by mouth daily. , Disp: , Rfl:  .  OVER THE COUNTER MEDICATION, Take 1 tablet by mouth 2 (two) times daily. Mushroom Formula - Stamets 7, Disp: , Rfl:  .  OVER THE COUNTER MEDICATION, Take 1 tablet by mouth 2 (two) times daily. Nutri-Calm, Disp: , Rfl:  .  Spirulina 500 MG TABS, Take 500 mg by mouth daily. , Disp: , Rfl:  .  UNABLE TO FIND, Med Name: [place] Balm, Disp: , Rfl:  .  UNABLE TO FIND, Med Name: liver care, Disp: , Rfl:  .  VENTOLIN HFA 108 (90 Base) MCG/ACT inhaler, TAKE 2 PUFFS BY MOUTH EVERY 6 HOURS AS NEEDED FOR WHEEZE OR SHORTNESS OF BREATH, Disp: 18 Inhaler, Rfl: 0  Allergies  Allergen Reactions  . Penicillins Anaphylaxis    Has patient had a PCN reaction causing immediate rash, facial/tongue/throat swelling, SOB or lightheadedness with hypotension: Yes Has patient had a PCN reaction causing severe rash involving mucus membranes or skin necrosis: No Has patient had a PCN reaction that required hospitalization: No Has patient had a PCN reaction occurring within the last 10 years: No If all of the above answers are "NO", then may proceed with Cephalosporin use.   . Codeine Nausea And Vomiting  . Latex Itching  . Sulfa Antibiotics Other (See Comments)    TOPICAL-SULFA CAUSED BLISTERS  . Tape Other (See Comments)    Reaction:  Blisters and burning  Pt states that paper tape is okay.    [address] Hcl] Anxiety    I personally reviewed active problem list, medication list, allergies, family history, social  history with the patient/caregiver today.   ROS  Constitutional: Negative for fever or weight change.  Respiratory: positive  for cough and shortness of breath.   Cardiovascular: Negative for chest pain or palpitations.  Gastrointestinal: Negative for abdominal pain, no bowel changes.  Musculoskeletal: Negative for gait problem or joint swelling.  Skin: Negative for rash.  Neurological: Negative for dizziness or headache.  No other specific complaints in a complete review of systems (except as listed in HPI above).  Objective  Vitals:   08/16/18 1143  BP: (!) 190/120  Pulse: (!) 115  Resp: 16  Temp: (!) 97.5 F (36.4 C)  TempSrc: Oral  SpO2: 99%  Weight: 125 lb 4.8 oz (56.8 kg)  Height: 5' 3"  (1.6 m)    Body mass index is 22.2 kg/m.  Physical Exam   Constitutional: Patient appears well-developed and well-nourished. No distress.  HEENT: head atraumatic, normocephalic, pupils equal and reactive to light,  neck supple, throat within normal limits Cardiovascular: Normal rate, regular rhythm and normal heart sounds.  No murmur heard. No BLE edema. Pulmonary/Chest: Effort normal , bilateral rhonchi. No respiratory distress. Abdominal: Soft.  There is no tenderness. Pelvic: normal , pap smear collected  Muscular skeletal: right quad has indentation from previous basal cell resection  Psychiatric: Patient has a normal mood and affect. behavior is normal. Judgment and thought content normal.  Recent Results (from the past 2160 hour(s))  I-STAT creatinine     Status: None   Collection Time: 06/20/18  9:58 AM  Result Value Ref Range   Creatinine, Ser 0.70 0.44 - 1.00 mg/dL  Magnesium     Status: None   Collection Time: 06/28/18  1:31 PM  Result Value Ref Range   Magnesium 2.1 1.7 - 2.4 mg/dL    Comment: Performed at [facility], [address] [place], [place] [address]  CBC with Differential/Platelet     Status: None   Collection Time: 06/28/18  1:31 PM  Result  Value Ref Range   WBC 8.1 4.0 - 10.5 K/uL   RBC 4.51 3.87 -  5.11 MIL/uL   Hemoglobin 13.2 12.0 - 15.0 g/dL   HCT 39.1 36.0 - 46.0 %   MCV 86.7 80.0 - 100.0 fL   MCH 29.3 26.0 - 34.0 pg   MCHC 33.8 30.0 - 36.0 g/dL   RDW 13.3 11.5 - 15.5 %   Platelets 258 150 - 400 K/uL   nRBC 0.0 0.0 - 0.2 %   Neutrophils Relative % 60 %   Neutro Abs 4.9 1.7 - 7.7 K/uL   Lymphocytes Relative 27 %   Lymphs Abs 2.2 0.7 - 4.0 K/uL   Monocytes Relative 9 %   Monocytes Absolute 0.7 0.1 - 1.0 K/uL   Eosinophils Relative 3 %   Eosinophils Absolute 0.2 0.0 - 0.5 K/uL   Basophils Relative 1 %   Basophils Absolute 0.1 0.0 - 0.1 K/uL   Immature Granulocytes 0 %   Abs Immature Granulocytes 0.02 0.00 - 0.07 K/uL    Comment: Performed at [facility], [address] [place], [place] [address]  Comprehensive metabolic panel     Status: Abnormal   Collection Time: 06/28/18  1:31 PM  Result Value Ref Range   Sodium 137 135 - 145 mmol/L   Potassium 3.7 3.5 - 5.1 mmol/L   Chloride 103 98 - 111 mmol/L   CO2 26 22 - 32 mmol/L   Glucose, Bld 129 (H) 70 - 99 mg/dL   BUN 24 (H) 8 - 23 mg/dL   Creatinine, Ser 0.84 0.44 - 1.00 mg/dL   Calcium 9.4 8.9 - 10.3 mg/dL   Total Protein 7.1 6.5 - 8.1 g/dL   Albumin 4.3 3.5 - 5.0 g/dL   AST 23 15 - 41 U/L   ALT 16 0 - 44 U/L   Alkaline Phosphatase 61 38 - 126 U/L   Total Bilirubin 0.6 0.3 - 1.2 mg/dL   GFR calc non Af Amer >60 >60 mL/min   GFR calc Af Amer >60 >60 mL/min    Comment: (NOTE) The eGFR has been calculated using the CKD EPI equation. This calculation has not been validated in all clinical situations. eGFR's persistently <60 mL/min signify possible Chronic Kidney Disease.    Anion gap 8 5 - 15    Comment: Performed at [facility], [address] [place], [place] [address]      PHQ2/9: Depression screen [facility] 2/9 12/26/2017 10/31/2016 08/01/2016 03/09/2016 11/10/2015  Decreased Interest 0 0 0 0 0  Down, Depressed, Hopeless 1 0 0 0 0  PHQ -  2 Score 1 0 0 0 0  Altered sleeping 0 - - - -  Tired, decreased energy 0 - - - -  Change in appetite 0 - - - -  Feeling bad or failure about yourself  0 - - - -  Trouble concentrating 0 - - - -  Moving slowly or fidgety/restless 0 - - - -  Suicidal thoughts 0 - - - -  PHQ-9 Score 1 - - - -  Difficult doing work/chores Not difficult at all - - - -    Fall Risk: Fall Risk  08/16/2018 12/26/2017 10/31/2016 08/01/2016 03/09/2016  Falls in the past year? 0 Yes No No No  Comment - fell on ice - - -  Number falls in past yr: - 1 - - -  Injury with Fall? - No - - -  Follow up - Falls evaluation completed;Education provided;Falls prevention discussed - - -     Assessment & Plan   1. COPD with  asthma ([place])  - Ambulatory referral to Chronic Care Management Services - umeclidinium bromide (INCRUSE ELLIPTA) 62.5 MCG/INH AEPB; Inhale 1 puff into the lungs daily.  Dispense: 60 each; Refill: 2 - fluticasone furoate-vilanterol (BREO ELLIPTA) 100-25 MCG/INH AEPB; INHALE 1 PUFF BY MOUTH INTO THE LUNGS DAILY  Dispense: 60 each; Refill: 1  2. Depression, major, Mild  ([place])   3. Atherosclerosis of aorta ([facility])  Refuses statin  4. Age-related osteoporosis without current pathological fracture  Could not tolerate alendronate, advised to follow up with Endo   5. White coat syndrome without diagnosis of hypertension  Advised to take valvium prior to next visit, she states during rehab her bp was always controlled and she will try to bring me a copy of readings   6. History of basal cell carcinoma (BCC) excision  Doing well, s/p excision  7. Pulmonary nodule, left  Followed by Dr. Grayland Ormond    9. Cervical cancer screening  - Cytology - PAP

## 2018-08-17 ENCOUNTER — Ambulatory Visit: Payer: Self-pay | Admitting: Pharmacist

## 2018-08-17 DIAGNOSIS — R030 Elevated blood-pressure reading, without diagnosis of hypertension: Secondary | ICD-10-CM

## 2018-08-17 DIAGNOSIS — F320 Major depressive disorder, single episode, mild: Secondary | ICD-10-CM

## 2018-08-17 DIAGNOSIS — M810 Age-related osteoporosis without current pathological fracture: Secondary | ICD-10-CM

## 2018-08-17 DIAGNOSIS — J449 Chronic obstructive pulmonary disease, unspecified: Secondary | ICD-10-CM

## 2018-08-17 NOTE — Chronic Care Management (AMB) (Signed)
  Chronic Care Management   Note  08/17/2018 Name: Patricia Galloway MRN: [id number] DOB: 24-Dec-1950    67 y.o. year old female referred to Chronic Care Management by Dr. Ancil Boozer for social needs (possible mold in home), medication management (not taking statin, bisphosphonate) and disease state education. Chronic conditions include osteoporosis, white coat HTN, basal cell carcinoma, COPD. Last office visit with [name], MD was 08/16/18.   Was unable to reach patient via telephone today and have left HIPAA compliant voicemail asking patient to return my call. (unsuccessful outreach #1).  Plan: Will follow-up within 3-5  business days via telephone.   Ruben Reason, PharmD Clinical Pharmacist [facility] [phone]

## 2018-08-20 LAB — CYTOLOGY - PAP: DIAGNOSIS: NEGATIVE

## 2018-08-21 ENCOUNTER — Ambulatory Visit: Payer: Self-pay | Admitting: Pharmacist

## 2018-08-21 DIAGNOSIS — F320 Major depressive disorder, single episode, mild: Secondary | ICD-10-CM

## 2018-08-21 DIAGNOSIS — J449 Chronic obstructive pulmonary disease, unspecified: Secondary | ICD-10-CM

## 2018-08-21 NOTE — Chronic Care Management (AMB) (Signed)
  Chronic Care Management   Note  08/21/2018 Name: Patricia Galloway MRN: [id number] DOB: April 26, 1951   Patricia Galloway is a 67 y.o. year old female who sees [name], MD for primary care. Dr. Ancil Boozer asked the CCM team to consult the patient for assistance with chronic disease management related to COPD/asthma and social needs. Referral was placed 08/16/18. Telephone outreach to patient today to introduce CCM services.   Plan: Patient agreed to services and verbal consent obtained. I have scheduled an appointment for Patricia Galloway on January 7th 2020   Patricia Galloway was given information about Chronic Care Management services today including:  1. CCM service includes personalized support from designated clinical staff supervised by her physician, including individualized plan of care and coordination with other care providers 2. 24/7 contact phone numbers for assistance for urgent and routine care needs. 3. Service will only be billed when office clinical staff spend 20 minutes or more in a month to coordinate care. 4. Only one practitioner may furnish and bill the service in a calendar month. 5. The patient may stop CCM services at any time (effective at the end of the month) by phone call to the office staff. 6. The patient will be responsible for cost sharing (co-pay) of up to 20% of the service fee (after annual deductible is met).   Ruben Reason, PharmD Clinical Pharmacist [facility] [phone]

## 2018-08-21 NOTE — Patient Instructions (Signed)
Patricia Galloway was given information about Chronic Care Management services today including:  1. CCM service includes personalized support from designated clinical staff supervised by her physician, including individualized plan of care and coordination with other care providers 2. 24/7 contact phone numbers for assistance for urgent and routine care needs. 3. Service will only be billed when office clinical staff spend 20 minutes or more in a month to coordinate care. 4. Only one practitioner may furnish and bill the service in a calendar month. 5. The patient may stop CCM services at any time (effective at the end of the month) by phone call to the office staff. 6. The patient will be responsible for cost sharing (co-pay) of up to 20% of the service fee (after annual deductible is met).  Patient agreed to services and verbal consent obtained.

## 2018-09-11 ENCOUNTER — Other Ambulatory Visit (INDEPENDENT_AMBULATORY_CARE_PROVIDER_SITE_OTHER): Payer: Medicare HMO | Admitting: Family Medicine

## 2018-09-11 ENCOUNTER — Ambulatory Visit: Payer: Medicare HMO

## 2018-09-11 DIAGNOSIS — J449 Chronic obstructive pulmonary disease, unspecified: Secondary | ICD-10-CM

## 2018-09-11 DIAGNOSIS — F320 Major depressive disorder, single episode, mild: Secondary | ICD-10-CM

## 2018-09-11 MED ORDER — FLUTICASONE FUROATE-VILANTEROL 100-25 MCG/INH IN AEPB: INHALATION_SPRAY | RESPIRATORY_TRACT | 2 refills | Status: DC

## 2018-09-11 NOTE — Chronic Care Management (AMB) (Deleted)
  Chronic Care Management   Initial Visit Note  09/11/2018 Name: Patricia Galloway MRN: [id number] DOB: 21-Dec-1950  Referred by: [name], MD Reason for referral : Chronic Care Management (resources for mold in home)   Subjective:   Objective:  Assessment: (brief patient description)  Goals    . "I want to eliminate medications as much as possible" (pt-stated)     Nurse Case Manager Clinical Goal(s): Over the next 30 days, patient will implement healthy eating strategies discussed today to maintain a healthy body and decrease likelyhood of developing more chronic diseases such as DM  Interventions:  . Provided patient with mediterranean/dash diet  Patient Self Care Activities:  Patient will take medications as prescribed       . DIET - INCREASE WATER INTAKE     Recommend to drink at least 6-8 8oz glasses of water per day.    . I need to get my breathing improved (pt-stated)     Nurse Case Manager Clinical Goal(s): Over the next 30 days patient will report implementing stratagies discussed today to improve breathing   Interventions:  . Basic COPD/Asthma education provided, discussed strategies to prevent bronchospasms . Reviewed pursed lip breathing    Patient Self Care Activities: ex. Takes medications (independent), administers insulin (in progress), checks cbg daily (independent) . Patient will take all medications as prescribed . Patient will continue to exercise weekly to increase stamina and strength and decrease shortness of breath . Patient will utilize pursed lip breathing for shortness of breath  Please see past updates related to this goal by clicking on the "Past Updates" button in the selected goal      . I want to keep exercising at [employer] (pt-stated)    . I want to know that I have clean air in my home (pt-stated)     Nurse Case Manager Clinical Goal(s): Over the next 30 days patient will verbalize utilizing resource for mold  detection  Interventions:  . Provided patient with resource for mold detection   Headquarters: [place], [place], [place] [address] Serving all of [place] and [place] Phone: [phone] Business Hours: 9:00am-5:00pm M-F      Plan:    Patricia Galloway was given information about Chronic Care Management services today including:  1. CCM service includes personalized support from designated clinical staff supervised by her physician, including individualized plan of care and coordination with other care providers 2. 24/7 contact phone numbers for assistance for urgent and routine care needs. 3. Service will only be billed when office clinical staff spend 20 minutes or more in a month to coordinate care. 4. Only one practitioner may furnish and bill the service in a calendar month. 5. The patient may stop CCM services at any time (effective at the end of the month) by phone call to the office staff. 6. The patient will be responsible for cost sharing (co-pay) of up to 20% of the service fee (after annual deductible is met).  Patient agreed to services and verbal consent obtained.  Provider Signature
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
## 2018-09-11 NOTE — Patient Instructions (Addendum)
1. Thank you allowing the CCM program to assist you with your healthcare needs and goals. It was a pleasure meeting you today!! 2. Please consider continuing your weekly exercise while you are caring for your husband. 3. Complete the Application for Property Tax Application before June 4. Utilize smoking cessation information provided today when you are ready. 5. Consider contacting [facility] for mold Inspection  Headquarters: [address], [place], [place] [address] Serving all of [place] and [place] Phone: [phone] Business Hours: 9:00am-5:00pm M-F  6. Cover your mouth with a scarf when you are out in the cold to prevent bronchospasm 7. Consider decreasing foods that cause reflux such as red wines, chocolates, fermented foods, and mint 8. Please contact the CCM Team with any questions or concerns 9. Ruben Reason will contact you for medication assistance  Goals Addressed            This Visit's Progress   . "I want to eliminate medications as much as possible" (pt-stated)       Nurse Case Manager Clinical Goal(s): Over the next 30 days, patient will implement healthy eating strategies discussed today to maintain a healthy body and decrease likelyhood of developing more chronic diseases such as DM  Interventions:  . Provided patient with mediterranean/dash diet  Patient Self Care Activities:  Patient will take medications as prescribed       . I need to get my breathing improved (pt-stated)       Nurse Case Manager Clinical Goal(s): Over the next 30 days patient will report implementing stratagies discussed today to improve breathing   Interventions:  . Basic COPD/Asthma education provided, discussed strategies to prevent bronchospasms . Reviewed pursed lip breathing    Patient Self Care Activities: ex. Takes medications (independent), administers insulin (in progress), checks cbg daily (independent) . Patient will take all medications as  prescribed . Patient will continue to exercise weekly to increase stamina and strength and decrease shortness of breath . Patient will utilize pursed lip breathing for shortness of breath  Please see past updates related to this goal by clicking on the "Past Updates" button in the selected goal      . I want to keep exercising at [employer] (pt-stated)      . I want to know that I have clean air in my home (pt-stated)       Nurse Case Manager Clinical Goal(s): Over the next 30 days patient will verbalize utilizing resource for mold detection  Interventions:  . Provided patient with resource for mold detection   Headquarters: [place], [place], [place] [address] Serving all of [place] and [place] Phone: [phone] Business Hours: 9:00am-5:00pm M-F       CCM (Chronic Care Management) Team   Trish Fountain RN, BSN Nurse Care Coordinator  [phone]  Ruben Reason PharmD  Clinical Pharmacist  [phone] Pursed Lip Breathing Pursed lip breathing is a technique to relieve the feeling of being short of breath. Some long-term respiratory conditions, like chronic obstructive pulmonary disease (COPD) and severe asthma, can make it hard to breathe out (exhale) all of the air in your lungs. This can make air that has less oxygen than normal build up in your lungs (air trapping). Trapped air means your lungs fill with less fresh air when you breathe in (inhale). As a result, you feel short of breath. Pursed lip breathing keeps your airways open longer when you exhale and empties more  air from your lungs. This makes more space for fresh air when you inhale. Pursed lip breathing can also slow down your breathing and help your body not have to work so hard to breathe. Over time, pursed lip breathing may help you be able to be more physically active and do more activities. How to perform pursed lip breathing Being short of breath can make you tense and anxious. Before  you start this breathing exercise, take a minute to relax your shoulders and close your eyes. Then: 1. Start the exercise by closing your mouth. 2. Breathe in through your nose, taking a normal breath. You can do this at your normal rate of breathing. If you feel you are not getting enough air, breathe in while slowly counting to 2 or 3. 3. Pucker (purse) your lips as if you were going to whistle. 4. Gently tighten your abdomen muscles or press on your belly to help push the air out. 5. Breathe out slowly through your pursed lips. Take at least twice as long to breathe out as it takes you to breathe in. 6. Make sure that you breathe out all of the air, but do not force air out. 7. Repeat the exercise until your breathing improves. Ask your health care provider how often and how long to do this exercise. Follow these instructions at home:  Take over-the-counter and prescription medicines only as told by your health care provider.  Return to your normal activities as told by your health care provider. Ask your health care provider what activities are safe for you.  Do not use any products that contain nicotine or tobacco, such as cigarettes and e-cigarettes. If you need help quitting, ask your health care provider.  Keep all follow-up visits as told by your health care provider. This is important. Contact a health care provider if:  Your shortness of breath gets worse.  You become less able to exercise or be active.  You develop a cough.  You develop a fever. Get help right away if:  You are struggling to breathe.  Your shortness of breath prevents you from engaging in any activity. Summary  Pursed lip breathing is a breathing technique that helps to remove trapped air from your lungs. It helps you get more oxygen into your lungs and makes your body have to work less hard to breathe.  Pursed lip breathing can gradually make you more able to be physically active.  You can do pursed  lip breathing on your own at home.  Ask your health care provider how often and how long you should do pursed lip breathing. This information is not intended to replace advice given to you by your health care provider. Make sure you discuss any questions you have with your health care provider. Document Released: 05/31/2008 Document Revised: 07/14/2016 Document Reviewed: 07/14/2016 Elsevier Interactive Patient Education  2019 [place] Eating Plan DASH stands for "Dietary Approaches to Stop Hypertension." The DASH eating plan is a healthy eating plan that has been shown to reduce high blood pressure (hypertension). It may also reduce your risk for type 2 diabetes, heart disease, and stroke. The DASH eating plan may also help with weight loss. What are tips for following this plan?  General guidelines  Avoid eating more than 2,300 mg (milligrams) of salt (sodium) a day. If you have hypertension, you may need to reduce your sodium intake to 1,500 mg a day.  Limit alcohol intake to no more than 1 drink  a day for nonpregnant women and 2 drinks a day for men. One drink equals 12 oz of beer, 5 oz of wine, or 1 oz of hard liquor.  Work with your health care provider to maintain a healthy body weight or to lose weight. Ask what an ideal weight is for you.  Get at least 30 minutes of exercise that causes your heart to beat faster (aerobic exercise) most days of the week. Activities may include walking, swimming, or biking.  Work with your health care provider or diet and nutrition specialist (dietitian) to adjust your eating plan to your individual calorie needs. Reading food labels   Check food labels for the amount of sodium per serving. Choose foods with less than 5 percent of the Daily Value of sodium. Generally, foods with less than 300 mg of sodium per serving fit into this eating plan.  To find whole grains, look for the word "whole" as the first word in the ingredient  list. Shopping  Buy products labeled as "low-sodium" or "no salt added."  Buy fresh foods. Avoid canned foods and premade or frozen meals. Cooking  Avoid adding salt when cooking. Use salt-free seasonings or herbs instead of table salt or sea salt. Check with your health care provider or pharmacist before using salt substitutes.  Do not fry foods. Cook foods using healthy methods such as baking, boiling, grilling, and broiling instead.  Cook with heart-healthy oils, such as olive, canola, soybean, or sunflower oil. Meal planning  Eat a balanced diet that includes: ? 5 or more servings of fruits and vegetables each day. At each meal, try to fill half of your plate with fruits and vegetables. ? Up to 6-8 servings of whole grains each day. ? Less than 6 oz of lean meat, poultry, or fish each day. A 3-oz serving of meat is about the same size as a deck of cards. One egg equals 1 oz. ? 2 servings of low-fat dairy each day. ? A serving of nuts, seeds, or beans 5 times each week. ? Heart-healthy fats. Healthy fats called Omega-3 fatty acids are found in foods such as flaxseeds and coldwater fish, like sardines, salmon, and mackerel.  Limit how much you eat of the following: ? Canned or prepackaged foods. ? Food that is high in trans fat, such as fried foods. ? Food that is high in saturated fat, such as fatty meat. ? Sweets, desserts, sugary drinks, and other foods with added sugar. ? Full-fat dairy products.  Do not salt foods before eating.  Try to eat at least 2 vegetarian meals each week.  Eat more home-cooked food and less restaurant, buffet, and fast food.  When eating at a restaurant, ask that your food be prepared with less salt or no salt, if possible. What foods are recommended? The items listed may not be a complete list. Talk with your dietitian about what dietary choices are best for you. Grains Whole-grain or whole-wheat bread. Whole-grain or whole-wheat pasta. Brown  rice. [place]. Bulgur. Whole-grain and low-sodium cereals. Pita bread. Low-fat, low-sodium crackers. Whole-wheat flour tortillas. Vegetables Fresh or frozen vegetables (raw, steamed, roasted, or grilled). Low-sodium or reduced-sodium tomato and vegetable juice. Low-sodium or reduced-sodium tomato sauce and tomato paste. Low-sodium or reduced-sodium canned vegetables. Fruits All fresh, dried, or frozen fruit. Canned fruit in natural juice (without added sugar). Meat and other protein foods Skinless chicken or [place]. Ground chicken or [place]. Pork with fat trimmed off. Fish and seafood. Egg whites. Dried beans,  peas, or lentils. Unsalted nuts, nut butters, and seeds. Unsalted canned beans. Lean cuts of beef with fat trimmed off. Low-sodium, lean deli meat. Dairy Low-fat (1%) or fat-free (skim) milk. Fat-free, low-fat, or reduced-fat cheeses. Nonfat, low-sodium ricotta or cottage cheese. Low-fat or nonfat yogurt. Low-fat, low-sodium cheese. Fats and oils Soft margarine without trans fats. Vegetable oil. Low-fat, reduced-fat, or light mayonnaise and salad dressings (reduced-sodium). Canola, safflower, olive, soybean, and sunflower oils. Avocado. Seasoning and other foods Herbs. Spices. Seasoning mixes without salt. Unsalted popcorn and pretzels. Fat-free sweets. What foods are not recommended? The items listed may not be a complete list. Talk with your dietitian about what dietary choices are best for you. Grains Baked goods made with fat, such as croissants, muffins, or some breads. Dry pasta or rice meal packs. Vegetables Creamed or fried vegetables. Vegetables in a cheese sauce. Regular canned vegetables (not low-sodium or reduced-sodium). Regular canned tomato sauce and paste (not low-sodium or reduced-sodium). Regular tomato and vegetable juice (not low-sodium or reduced-sodium). Angie Fava. Olives. Fruits Canned fruit in a light or heavy syrup. Fried fruit. Fruit in cream or butter  sauce. Meat and other protein foods Fatty cuts of meat. Ribs. Fried meat. Berniece Salines. Sausage. Bologna and other processed lunch meats. Salami. Fatback. Hotdogs. Bratwurst. Salted nuts and seeds. Canned beans with added salt. Canned or smoked fish. Whole eggs or egg yolks. Chicken or [place] with skin. Dairy Whole or 2% milk, cream, and half-and-half. Whole or full-fat cream cheese. Whole-fat or sweetened yogurt. Full-fat cheese. Nondairy creamers. Whipped toppings. Processed cheese and cheese spreads. Fats and oils Butter. Stick margarine. Lard. Shortening. Ghee. Bacon fat. Tropical oils, such as coconut, palm kernel, or palm oil. Seasoning and other foods Salted popcorn and pretzels. Onion salt, garlic salt, seasoned salt, table salt, and sea salt. Worcestershire sauce. Tartar sauce. Barbecue sauce. Teriyaki sauce. Soy sauce, including reduced-sodium. Steak sauce. Canned and packaged gravies. Fish sauce. Oyster sauce. Cocktail sauce. Horseradish that you find on the shelf. Ketchup. Mustard. Meat flavorings and tenderizers. Bouillon cubes. Hot sauce and Tabasco sauce. Premade or packaged marinades. Premade or packaged taco seasonings. Relishes. Regular salad dressings. Where to find more information:  National Heart, Lung, and [place]: https://wilson-eaton.com/  American Heart Association: www.heart.org Summary  The DASH eating plan is a healthy eating plan that has been shown to reduce high blood pressure (hypertension). It may also reduce your risk for type 2 diabetes, heart disease, and stroke.  With the DASH eating plan, you should limit salt (sodium) intake to 2,300 mg a day. If you have hypertension, you may need to reduce your sodium intake to 1,500 mg a day.  When on the DASH eating plan, aim to eat more fresh fruits and vegetables, whole grains, lean proteins, low-fat dairy, and heart-healthy fats.  Work with your health care provider or diet and nutrition specialist (dietitian) to adjust  your eating plan to your individual calorie needs. This information is not intended to replace advice given to you by your health care provider. Make sure you discuss any questions you have with your health care provider. Document Released: 08/11/2011 Document Revised: 08/15/2016 Document Reviewed: 08/15/2016 Elsevier Interactive Patient Education  2019 [employer].

## 2018-09-25 ENCOUNTER — Telehealth: Payer: Self-pay

## 2018-09-25 ENCOUNTER — Ambulatory Visit: Payer: Self-pay

## 2018-09-25 NOTE — Chronic Care Management (AMB) (Signed)
  Chronic Care Management Note    Patricia Galloway a 68 y.o.year old femalewho sees [name], MDfor primary care. Dr. Lenox Ahr the CCM team to consult the patient for assistance with chronic disease management related to COPD/asthma and social needs. Referral was placed12/12/19. Patricia Galloway met with CCM RN CM 09/11/2018 in the office to establish health goals.  Was unable to reach patient via telephone today for follow-up and have left HIPAA compliant voicemail asking patient to return my call. (unsuccessful outreach #1).  Plan: Will follow-up within 3-5  business days via telephone.     Patricia Galloway E. Rollene Rotunda, RN, BSN Nurse Care Coordinator [facility] Care Management  [phone]

## 2018-10-02 ENCOUNTER — Ambulatory Visit: Payer: Self-pay

## 2018-10-02 ENCOUNTER — Telehealth: Payer: Self-pay

## 2018-10-02 DIAGNOSIS — F320 Major depressive disorder, single episode, mild: Secondary | ICD-10-CM

## 2018-10-02 DIAGNOSIS — R739 Hyperglycemia, unspecified: Secondary | ICD-10-CM

## 2018-10-02 DIAGNOSIS — J449 Chronic obstructive pulmonary disease, unspecified: Secondary | ICD-10-CM

## 2018-10-02 DIAGNOSIS — R030 Elevated blood-pressure reading, without diagnosis of hypertension: Secondary | ICD-10-CM

## 2018-10-02 NOTE — Chronic Care Management (AMB) (Signed)
  Chronic Care Management   Note  10/02/2018 Name: Patricia Galloway MRN: [id number] DOB: 11-29-1950  Patricia Brayis a 68 y.o.year old femalewho sees [name], MDfor primary care. Dr. Lenox Ahr the CCM team to consult the patient for assistance with chronic disease management related to COPD/asthma and social needs. Referral was placed12/12/19.Patricia Galloway met with CCM RN CM 09/11/2018 in the office to establish health goals.  Was unable to reach patient via telephone today for follow-up and have left HIPAA compliant voicemail asking patient to return my call. (unsuccessful outreach #2).   The CM team will reach out to the patient again over the next 7 days.      Patricia Okelley E. Rollene Rotunda, RN, BSN Nurse Care Coordinator [facility] Care Management  [phone]

## 2018-10-11 ENCOUNTER — Ambulatory Visit (INDEPENDENT_AMBULATORY_CARE_PROVIDER_SITE_OTHER): Payer: Medicare HMO

## 2018-10-11 DIAGNOSIS — F4321 Adjustment disorder with depressed mood: Secondary | ICD-10-CM

## 2018-10-11 DIAGNOSIS — Z634 Disappearance and death of family member: Secondary | ICD-10-CM

## 2018-10-11 DIAGNOSIS — F4329 Adjustment disorder with other symptoms: Secondary | ICD-10-CM

## 2018-10-11 DIAGNOSIS — R739 Hyperglycemia, unspecified: Secondary | ICD-10-CM

## 2018-10-11 DIAGNOSIS — J449 Chronic obstructive pulmonary disease, unspecified: Secondary | ICD-10-CM

## 2018-10-11 NOTE — Patient Instructions (Signed)
Thank you allowing the Chronic Care Management Team to be a part of your care! It was a pleasure speaking with you today!  1. Please return to [facility]. Taking time for yourself is important for your mental and physical wellbeing.  2. Complete and return the property tax reduction form before June 3. Consider accepting and discussing caregiver support resources 4. Call and schedule free mold assessment 5. Continue to take all medications as prescribed 6. Smoking cessation would significantly improve your breathing. We will happy to assist with resources when you are ready. 7.  CCM (Chronic Care Management) Team   Trish Fountain RN, BSN Nurse Care Coordinator  [phone]  Ruben Reason PharmD  Clinical Pharmacist  [phone]   Goals Addressed            This Visit's Progress   . "I am having a hard time paying for my medications because property tax is due" (pt-stated)   On track    Nurse Case Manager Clinical Goal(s): Over the next 30 days patient will report completing application for property tax reduction  Interventions:  . Provided patient with application for [place] property tax reduction as patient qualifies related to age and income.         . COMPLETED: "I want to eliminate medications as much as possible" (pt-stated)       Nurse Case Manager Clinical Goal(s): Over the next 30 days, patient will implement healthy eating strategies discussed today to maintain a healthy body and decrease likelyhood of developing more chronic diseases such as DM  Interventions:  . Provided patient with mediterranean/dash diet  Patient Self Care Activities:  Patient will take medications as prescribed       . I need to get my breathing improved (pt-stated)   On track    Nurse Case Manager Clinical Goal(s): Over the next 30 days patient will report implementing stratagies discussed today to improve breathing  Interventions:  . Basic COPD/Asthma education provided,  discussed strategies to prevent bronchospasms . Reviewed pursed lip breathing . Provided patient with smoking cessation information including Quit Smart Classes  Patient Self Care Activities: ex. Takes medications (independent), administers insulin (in progress), checks cbg daily (independent) . Patient will take all medications as prescribed . Patient will continue to exercise weekly to increase stamina and strength and decrease shortness of breath . Patient will utilize pursed lip breathing for shortness of breath  Please see past updates related to this goal by clicking on the "Past Updates" button in the selected goal      . I want to keep exercising at [facility] (pt-stated)   Not on track    Nurse Case Manager Clinical Goal(s): Over the next 30 days, patient will reengage with [facility]  Interventions:  . Active listening, Provided emotional support and reassurance . Discussed effects of exercise on mental and physical wellbeing   Patient Self Care Activities: ex. Takes medications (independent), administers insulin (in progress), checks cbg daily (independent) . Patient will exercise independently at [facility]  Please see past updates related to this goal by clicking on the "Past Updates" button in the selected goal      . I want to know that I have clean air in my home (pt-stated)   Not on track    Nurse Case Manager Clinical Goal(s): Over the next 30 days patient will verbalize utilizing resource for mold detection  Interventions:  . Provided patient with resource for mold  detection   Headquarters: [place], [place], [place] [address] Serving all of [place] and [place] Phone: [phone] Business Hours: 9:00am-5:00pm M-F    The patient verbalized understanding of instructions provided today and declined a print copy of patient instruction materials.   Telephone follow up appointment with CCM team member scheduled for: 30 days

## 2018-10-11 NOTE — Chronic Care Management (AMB) (Signed)
.  Chronic Care Management   Follow Up Note   10/11/2018 Name: Patricia Galloway MRN: [id number] DOB: [birth year]  Referred by: [name], MD Reason for referral : Chronic Care Management (follow up mold, diet, financial)   Subjective: "We have had so much going on that I have not been able to complete my goals but I have started the process"   Objective:  BP Readings from Last 3 Encounters:  08/16/18 (!) 180/110  06/28/18 (!) 181/101  03/15/18 (!) 172/100    Assessment: Patricia Galloway a 68 y.o.year old femalewho sees [name], MDfor primary care. Dr. Lenox Ahr the CCM team to consult the patient for assistance with chronic disease management related to COPD/asthma and social needs. Referral was placed12/12/19. Today [name] met with CCM RN CM face to face for initial visit and to establish her health goals. Patricia Galloway does not want to address her hypertension as it is "white coat syndrome" secondary to a traumatic experience with a medical procedure when she was young. Dr. Ancil Boozer has provided valium to the patient prior to Md visits for anxiety alleviation.   Goals Addressed    . "I am having a hard time paying for my medications because property tax is due" (pt-stated)   On track    [name] has begun filling out the application to have her property taxes reduced. She wants to include her tax returns as proof (although not necessary) of their low income along with their social security payment. She hopes to have the documents together over the next couple of weeks. Deadline for application is June 6295.  Nurse Case Manager Clinical Goal(s): Over the next 30 days patient will report completing application for property tax reduction  Interventions:  . Provided patient with application for [place] property tax reduction as patient qualifies related to age and income.    . COMPLETED: "I want to eliminate medications as much as possible" (pt-stated)       Patricia Galloway  understands the importance of managing her chronic conditions through healthy eating, exercise, and lifestyle modifications. She is not "a believer" in traditional medical therapy however she is compliant with the medications she has been prescribed. She takes lot of natural supplements through [facility] including compounds for vascular support. She eats a no salt diet, lots of fish, no fried foods, and plenty of fresh fruits and vegetables. She limits her intake of meats.  Nurse Case Manager Clinical Goal(s): Over the next 30 days, patient will implement healthy eating strategies discussed today to maintain a healthy body and decrease likelyhood of developing more chronic diseases such as DM  Interventions:  . Provided patient with mediterranean/dash diet  Patient Self Care Activities:  Patient will take medications as prescribed    . I need to get my breathing improved (pt-stated)   On track    [name] states her breathing has improved since the addition of incruse with her brio. She is also utilizing pursed lip breathing daily. She understands she needs to quit smoking but admits this is her "vice" that she utilizes to control her anxiety and caregiver strain. She has been offered resources for counseling and support but declines. She refuses smoking cessation counseling and materials.  Nurse Case Manager Clinical Goal(s): Over the next 30 days patient will report implementing stratagies discussed today to improve breathing  Interventions:  . Basic COPD/Asthma education provided, discussed strategies to prevent bronchospasms . Reviewed pursed lip breathing . Provided patient with smoking cessation information  including Quit Smart Classes  Patient Self Care Activities: ex. Takes medications (independent), administers insulin (in progress), checks cbg daily (independent) . Patient will take all medications as prescribed . Patient will continue to exercise weekly to increase stamina and  strength and decrease shortness of breath . Patient will utilize pursed lip breathing for shortness of breath  Please see past updates related to this goal by clicking on the "Past Updates" button in the selected goal      . I want to keep exercising at [facility] (pt-stated)   Not on track    Patricia Galloway was scheduled to return to [facility] this week however she had to call and reschedule related to taking care of the needs of her husband. She has plans to return over the next couple of weeks. She has been offered caregiver support but declines need. She understands the benefits of exercise on physical and emotional health.  Nurse Case Manager Clinical Goal(s): Over the next 30 days, patient will reengage with [facility]  Interventions:  . Active listening, Provided emotional support and reassurance . Discussed effects of exercise on mental and physical wellbeing   Patient Self Care Activities: ex. Takes medications (independent), administers insulin (in progress), checks cbg daily (independent) . Patient will exercise independently at [facility]   Please see past updates related to this goal by clicking on the "Past Updates" button in the selected goal      . I want to know that I have clean air in my home (pt-stated)   Not on track    Patricia Galloway has not scheduled an appointment for mold assessment. She has contacted company and is aware they will do a free assessment. She wanted to wait until she is sure the rainy season has passed. Her basement has currently flooded today with all the rain. This is where her main concern from mold is.  Nurse Case Manager Clinical Goal(s): Over the next 30 days patient will verbalize utilizing resource for mold detection  Interventions:  . Provided patient with resource for mold detection   Headquarters: [place], [place], [place] [address] Serving all of [place] and [place] Phone: [phone] Business Hours: 9:00am-5:00pm  M-F           Telephone follow up appointment with CCM team member scheduled for: 30 days     [name] E. Rollene Rotunda, RN, BSN Nurse Care Coordinator [facility] Care Management  [phone]

## 2018-11-14 ENCOUNTER — Encounter: Payer: Self-pay | Admitting: Family Medicine

## 2018-11-16 ENCOUNTER — Other Ambulatory Visit: Payer: Self-pay

## 2018-11-16 ENCOUNTER — Encounter: Payer: Self-pay | Admitting: Family Medicine

## 2018-11-16 ENCOUNTER — Ambulatory Visit (INDEPENDENT_AMBULATORY_CARE_PROVIDER_SITE_OTHER): Payer: Medicare HMO | Admitting: Family Medicine

## 2018-11-16 VITALS — BP 178/98 | HR 105 | Temp 98.2°F | Resp 20 | Ht 63.0 in | Wt 124.4 lb

## 2018-11-16 DIAGNOSIS — Z85828 Personal history of other malignant neoplasm of skin: Secondary | ICD-10-CM

## 2018-11-16 DIAGNOSIS — R69 Illness, unspecified: Secondary | ICD-10-CM | POA: Diagnosis not present

## 2018-11-16 DIAGNOSIS — J4540 Moderate persistent asthma, uncomplicated: Secondary | ICD-10-CM

## 2018-11-16 DIAGNOSIS — J449 Chronic obstructive pulmonary disease, unspecified: Secondary | ICD-10-CM | POA: Diagnosis not present

## 2018-11-16 DIAGNOSIS — F320 Major depressive disorder, single episode, mild: Secondary | ICD-10-CM | POA: Diagnosis not present

## 2018-11-16 DIAGNOSIS — M810 Age-related osteoporosis without current pathological fracture: Secondary | ICD-10-CM | POA: Diagnosis not present

## 2018-11-16 DIAGNOSIS — Z9889 Other specified postprocedural states: Secondary | ICD-10-CM | POA: Diagnosis not present

## 2018-11-16 DIAGNOSIS — R739 Hyperglycemia, unspecified: Secondary | ICD-10-CM | POA: Diagnosis not present

## 2018-11-16 DIAGNOSIS — E785 Hyperlipidemia, unspecified: Secondary | ICD-10-CM

## 2018-11-16 DIAGNOSIS — I700 Atherosclerosis of aorta: Secondary | ICD-10-CM | POA: Diagnosis not present

## 2018-11-16 DIAGNOSIS — Z79899 Other long term (current) drug therapy: Secondary | ICD-10-CM | POA: Diagnosis not present

## 2018-11-16 DIAGNOSIS — I10 Essential (primary) hypertension: Secondary | ICD-10-CM

## 2018-11-16 MED ORDER — FLUTICASONE-UMECLIDIN-VILANT 100-62.5-25 MCG/INH IN AEPB: 1.0000 | INHALATION_SPRAY | Freq: Every day | RESPIRATORY_TRACT | 0 refills | Status: DC

## 2018-11-16 MED ORDER — DIAZEPAM 5 MG PO TABS: 5.0000 mg | ORAL_TABLET | Freq: Every day | ORAL | 0 refills | Status: DC | PRN

## 2018-11-16 MED ORDER — HYDROCHLOROTHIAZIDE 12.5 MG PO TABS: 12.5000 mg | ORAL_TABLET | Freq: Every day | ORAL | 0 refills | Status: DC

## 2018-11-16 NOTE — Progress Notes (Signed)
Name: Patricia Galloway   MRN: [id number]    DOB: 1951/02/28   Date:11/16/2018       Progress Note  Subjective  Chief Complaint  Chief Complaint  Patient presents with  . Medication Refill    3 month F/U  . COPD  . Asthma    Has a cough but exaceberate with dairy, sugar and meat produces-will produce phlegm after indigesting these foods  . Hypertension    Stress Response and white coat syndrome make her BP worst  . Depression  . Atherosclerosis aorta  . Osteoporosis    HPI  Basal cell carcinoma right leg:she is doing well, s/p removal and chemotherapy. Her gait is back to normal , full rom of motion.    HTN with white coat: she does not want medication at this time,bp during pulmonary rehab was always normal, reviewed logs with patient today. She states that bp goes up when she gets to doctor's visits. BP was not as high when she saw LPN for medicare well. Advised to take valium half pill 30 minutes prior to office visit  Major Depression in remission: recently feeling more overwhelmed, husband getting treated for kidney cancer, had surgery, car broke down, plumbing issues and had a panic attack about 5 weeks ago. She talked to her husband, took valium and symptoms improved.   Asthma/COPD: using Caroll Rancher, but states seems like something in her house is triggering her symptoms. She needs refill of medication. She has a basement and has some water problems, she may need to have her house tested for mold. She states she has been using Breo in am but is wheezing in the afternoon.  She had CT chest done recently by Dr. Grayland Ormond. She has daily productive cough. Discussed spirometry but she states she cannot tolerate Albuterol , she is not sure if she wants to see pulmonologist or add medication, but states prefer trying medication first .   Atherosclerosis aorta: refuses statin therapy, discussed baby aspirin on her last visit, but still not taking it.   Osteoporosis: discussed  options, she is willing to see Endocrinologist Discussed Prolia and Forteo, bone density done 07/2017.    Patient Active Problem List   Diagnosis Date Noted  . Pain in limb 03/29/2017  . Basal cell carcinoma 03/29/2017  . Thoracic aortic aneurysm without rupture ([place]) 10/31/2016  . Atherosclerosis of aorta ([place]) 10/31/2016  . Chronic pancreatitis ([place]) 10/31/2016  . Pulmonary nodule 10/31/2016  . Centrilobular emphysema ([place]) 10/31/2016  . Iron deficiency anemia due to chronic blood loss 10/24/2016  . Basal cell carcinoma, leg, right 08/24/2016  . Hyperglycemia 08/01/2016  . Chronic thoracic back pain 08/04/2015  . Panic attack 08/04/2015  . Complicated grieving 78/29/5621  . White coat syndrome without diagnosis of hypertension 08/04/2015  . Major depression, recurrent ([place]) 08/04/2015  . Asthma, moderate persistent, poorly-controlled 08/04/2015    Past Surgical History:  Procedure Laterality Date  . BASAL CELL CARCINOMA EXCISION Right 03/29/2017   Procedure: EXCISION OF RIGHT LEG BASEL CELL CARCINOMA;  Surgeon: [name], DO;  Location: [facility];  Service: Plastics;  Laterality: Right;  . COLON SURGERY  2002   colostomy for 10 months after a perfurated sigmoid   . COLOSTOMY REVERSAL  2003  . EMBOLIZATION Right 10/12/2016   Procedure: Embolization;  Surgeon: [name], MD;  Location: [facility];  Service: Cardiovascular;  Laterality: Right;  . etopic  3086,5784  . INCISION AND DRAINAGE OF WOUND Right 03/30/2017   Procedure:  IRRIGATION AND DEBRIDEMENT WOUND; VAC PLACEMENT;  Surgeon: [name], DO;  Location: [facility];  Service: Plastics;  Laterality: Right;  . TONSILLECTOMY      Family History  Problem Relation Age of Onset  . Dementia Mother   . Aortic stenosis Mother   . Osteoporosis Mother   . Dementia Father   . Diabetes Father   . Hyperlipidemia Father   . Hypertension Father   . Aneurysm Sister   . Hyperlipidemia Sister   .  Hypertension Sister   . Diabetes Sister   . Breast cancer Neg Hx     Social History   Socioeconomic History  . Marital status: Married    Spouse name: [name]  . Number of children: 0  . Years of education: Not on file  . Highest education level: Bachelor's degree (e.g., BA, AB, BS)  Occupational History  . Occupation: Retired  Scientific laboratory technician  . Financial resource strain: Not hard at all  . Food insecurity:    Worry: Never true    Inability: Never true  . Transportation needs:    Medical: No    Non-medical: No  Tobacco Use  . Smoking status: Former Smoker    Packs/day: 0.50    Years: 25.00    Pack years: 12.50    Types: Cigarettes    Start date: 09/05/1972    Last attempt to quit: 08/03/1998    Years since quitting: 20.3  . Smokeless tobacco: Never Used  . Tobacco comment: smoking cessation materials not required  Substance and Sexual Activity  . Alcohol use: No    Alcohol/week: 0.0 standard drinks  . Drug use: No  . Sexual activity: Not Currently    Birth control/protection: Post-menopausal  Lifestyle  . Physical activity:    Days per week: 2 days    Minutes per session: 60 min  . Stress: Only a little  Relationships  . Social connections:    Talks on phone: Patient refused    Gets together: Patient refused    Attends religious service: Patient refused    Active member of club or organization: Patient refused    Attends meetings of clubs or organizations: Patient refused    Relationship status: Married  . Intimate partner violence:    Fear of current or ex partner: No    Emotionally abused: No    Physically abused: No    Forced sexual activity: No  Other Topics Concern  . Not on file  Social History Narrative  . Not on file     Current Outpatient Medications:  .  acidophilus (RISAQUAD) CAPS capsule, Take 3 capsules by mouth at bedtime., Disp: , Rfl:  .  cholecalciferol (VITAMIN D) 1000 units tablet, Take 1,000 Units by mouth 2 (two) times daily., Disp: ,  Rfl:  .  Coenzyme Q10 (COQ10) 400 MG CAPS, Take 400 mg by mouth 2 (two) times daily. , Disp: , Rfl:  .  diazepam (VALIUM) 5 MG tablet, Take 1 tablet (5 mg total) by mouth daily as needed for anxiety. 30 minutes before doctor's visit, Disp: 30 tablet, Rfl: 0 .  ELDERBERRY PO, Take by mouth. Every Winter, Disp: , Rfl:  .  fluticasone furoate-vilanterol (BREO ELLIPTA) 100-25 MCG/INH AEPB, INHALE 1 PUFF BY MOUTH INTO THE LUNGS DAILY, Disp: 60 each, Rfl: 2 .  Ginkgo Biloba Extract (GNP GINGKO BILOBA EXTRACT) 60 MG CAPS, Take 60 mg by mouth as needed. , Disp: , Rfl:  .  Multiple Vitamin (MULTIVITAMIN) capsule, Take 2  capsules by mouth daily. , Disp: , Rfl:  .  OVER THE COUNTER MEDICATION, Place 1 drop under the tongue daily. colloidal silver, Disp: , Rfl:  .  Spirulina 500 MG TABS, Take 500 mg by mouth daily. , Disp: , Rfl:  .  umeclidinium bromide (INCRUSE ELLIPTA) 62.5 MCG/INH AEPB, Inhale 1 puff into the lungs daily., Disp: 60 each, Rfl: 2 .  UNABLE TO FIND, Med Name: Koren Bound Balm  Only with radiation, Disp: , Rfl:  .  UNABLE TO FIND, Med Name: liver care, Disp: , Rfl:  .  UNABLE TO FIND, Take 2 capsules by mouth daily. Nucleo Immune, Disp: , Rfl:  .  UNABLE TO FIND, 2 capsules 2 (two) times daily. Alloicidin supplement, Disp: , Rfl:  .  VENTOLIN HFA 108 (90 Base) MCG/ACT inhaler, TAKE 2 PUFFS BY MOUTH EVERY 6 HOURS AS NEEDED FOR WHEEZE OR SHORTNESS OF BREATH, Disp: 18 Inhaler, Rfl: 0 .  Digestive Enzyme CAPS, Take 1 capsule by mouth 3 (three) times daily after meals. , Disp: , Rfl:  .  magnesium 30 MG tablet, Take 60 mg by mouth daily. , Disp: , Rfl:  .  OVER THE COUNTER MEDICATION, Take 1 tablet by mouth 2 (two) times daily. Mushroom Formula - Stamets 7, Disp: , Rfl:  .  OVER THE COUNTER MEDICATION, Take 1 tablet by mouth 2 (two) times daily. Nutri-Calm, Disp: , Rfl:   Allergies  Allergen Reactions  . Penicillins Anaphylaxis    Has patient had a PCN reaction causing immediate rash,  facial/tongue/throat swelling, SOB or lightheadedness with hypotension: Yes Has patient had a PCN reaction causing severe rash involving mucus membranes or skin necrosis: No Has patient had a PCN reaction that required hospitalization: No Has patient had a PCN reaction occurring within the last 10 years: No If all of the above answers are "NO", then may proceed with Cephalosporin use.   . Codeine Nausea And Vomiting  . Latex Itching  . Sulfa Antibiotics Other (See Comments)    TOPICAL-SULFA CAUSED BLISTERS  . Tape Other (See Comments)    Reaction:  Blisters and burning  Pt states that paper tape is okay.    [address] Hcl] Anxiety    I personally reviewed active problem list, medication list, allergies, family history, social history with the patient/caregiver today.   ROS  Constitutional: Negative for fever or weight change.  Respiratory:positive for cough and shortness of breath.   Cardiovascular: negative  or chest pain or palpitations.  Gastrointestinal: Negative for abdominal pain, no bowel changes.  Musculoskeletal: Negative for gait problem or joint swelling.  Skin: Negative for rash.  Neurological: Negative for dizziness or headache.  No other specific complaints in a complete review of systems (except as listed in HPI above).   Objective  Vitals:   11/16/18 1155  BP: (!) 178/98  Pulse: (!) 105  Resp: 20  Temp: 98.2 F (36.8 C)  TempSrc: Oral  SpO2: 96%  Weight: 124 lb 6.4 oz (56.4 kg)  Height: 5\' 3"  (1.6 m)    Body mass index is 22.04 kg/m.  Physical Exam  Constitutional: Patient appears well-developed and well-nourished.  No distress.  HEENT: head atraumatic, normocephalic, pupils equal and reactive to light,  neck supple, throat within normal limits Cardiovascular: Normal rate, regular rhythm and normal heart sounds.  No murmur heard. No BLE edema. Pulmonary/Chest: Effort normal and breath sounds normal. No respiratory  distress. Abdominal: Soft.  There is no tenderness. Psychiatric: Patient has a normal  mood and affect. behavior is normal. Judgment and thought content normal.  PHQ2/9: Depression screen [facility] 2/9 11/16/2018 08/16/2018 12/26/2017 10/31/2016 08/01/2016  Decreased Interest 0 0 0 0 0  Down, Depressed, Hopeless 0 0 1 0 0  PHQ - 2 Score 0 0 1 0 0  Altered sleeping 1 0 0 - -  Tired, decreased energy 1 1 0 - -  Change in appetite 0 0 0 - -  Feeling bad or failure about yourself  0 0 0 - -  Trouble concentrating 0 0 0 - -  Moving slowly or fidgety/restless 0 0 0 - -  Suicidal thoughts 0 0 0 - -  PHQ-9 Score 2 1 1  - -  Difficult doing work/chores Not difficult at all Somewhat difficult Not difficult at all - -    Fall Risk: Fall Risk  11/16/2018 08/16/2018 12/26/2017 10/31/2016 08/01/2016  Falls in the past year? 0 0 Yes No No  Comment - - fell on ice - -  Number falls in past yr: 0 - 1 - -  Injury with Fall? 0 - No - -  Follow up - - Falls evaluation completed;Education provided;Falls prevention discussed - -    Functional Status Survey: Is the patient deaf or have difficulty hearing?: No Does the patient have difficulty seeing, even when wearing glasses/contacts?: No Does the patient have difficulty concentrating, remembering, or making decisions?: No Does the patient have difficulty walking or climbing stairs?: No Does the patient have difficulty dressing or bathing?: No Does the patient have difficulty doing errands alone such as visiting a doctor's office or shopping?: No    Assessment & Plan  1. Elevated blood pressure reading in office with white coat syndrome, with diagnosis of hypertension  - hydrochlorothiazide (HYDRODIURIL) 12.5 MG tablet; Take 1 tablet (12.5 mg total) by mouth daily.  Dispense: 90 tablet; Refill: 0 Valium prior to office visits   2. Mild major depression ([place])  Mostly frustrated that husband has not recovered from surgery   3. Age-related osteoporosis  without current pathological fracture   4. History of basal cell carcinoma (BCC) excision   5. Atherosclerosis of aorta (H[facility])  Refuses statin therapy   6. COPD with asthma ([place])  - Fluticasone-Umeclidin-Vilant (TRELEGY ELLIPTA) 100-62.5-25 MCG/INH AEPB; Inhale 1 puff into the lungs daily.  Dispense: 180 each; Refill: 0  6. COPD with asthma ([place])  - Fluticasone-Umeclidin-Vilant (TRELEGY ELLIPTA) 100-62.5-25 MCG/INH AEPB; Inhale 1 puff into the lungs daily.  Dispense: 180 each; Refill: 0  7. Asthma, moderate persistent, poorly-controlled  Triggered by activity and exposure to her cat   8. Dyslipidemia  - Lipid panel  9. Long-term use of high-risk medication  - BASIC METABOLIC PANEL WITH GFR  10. Hyperglycemia  - Hemoglobin A1c

## 2018-12-27 ENCOUNTER — Ambulatory Visit: Payer: Medicare HMO | Admitting: Oncology

## 2018-12-27 ENCOUNTER — Other Ambulatory Visit: Payer: Medicare HMO

## 2019-02-10 NOTE — Progress Notes (Signed)
[place]  [phone]) [phone] Fax:(336) [phone]  ID: Patricia Galloway OB: [birth year]  MR#: [id number]  XVQ#:[id number]  Patient Care Team: [name], MD as PCP - General (Family Medicine) Bary Castilla, Forest Gleason, MD (General Surgery) Lloyd Huger, MD as Consulting Physician (Oncology) Dillingham, Loel Lofty, DO as Attending Physician (Plastic Surgery) Cathi Roan, [facility] (Pharmacist) Benedetto Goad, RN as Case Manager  CHIEF COMPLAINT: Iron deficiency anemia, locally advanced basal cell carcinoma of the right leg.  INTERVAL HISTORY: Patient returns to clinic today for routine 70-month evaluation.  She continues to feel well and remains asymptomatic. She has no neurologic complaints. She denies any recent fevers or illnesses.  She has a good appetite and denies weight loss.  She denies any chest pain, shortness of breath, cough, or hemoptysis.  She denies any nausea, vomiting, constipation, or diarrhea.  She has no melena or hematochezia.  She has no urinary complaints.  Patient feels at her baseline offers no specific complaints today.  REVIEW OF SYSTEMS:   Review of Systems  Constitutional: Negative for fever, malaise/fatigue and weight loss.  Respiratory: Negative.  Negative for cough and shortness of breath.   Cardiovascular: Negative.  Negative for chest pain and leg swelling.  Gastrointestinal: Negative.  Negative for abdominal pain, nausea and vomiting.  Genitourinary: Negative.  Negative for dysuria.  Musculoskeletal: Negative.  Negative for myalgias.  Skin: Negative.  Negative for rash.  Neurological: Negative.  Negative for sensory change, focal weakness and weakness.  Psychiatric/Behavioral: Negative.  The patient is not nervous/anxious.     As per HPI. Otherwise, a complete review of systems is negative.  PAST MEDICAL HISTORY: Past Medical History:  Diagnosis Date  . Anemia    has had iron infusion  . Anxiety    complicated grief  .  Aortic aneurysm ([place])    found on CT in December, 2017  . Asthma    as child , but no episodes in over 30 years  . Basal cell carcinoma   . Cancer ([place]) 08/2016   basal cell carcinoma - right leg  . History of transfusion of packed red blood cells     PAST SURGICAL HISTORY: Past Surgical History:  Procedure Laterality Date  . BASAL CELL CARCINOMA EXCISION Right 03/29/2017   Procedure: EXCISION OF RIGHT LEG BASEL CELL CARCINOMA;  Surgeon: [name], DO;  Location: [facility];  Service: Plastics;  Laterality: Right;  . COLON SURGERY  2002   colostomy for 10 months after a perfurated sigmoid   . COLOSTOMY REVERSAL  2003  . EMBOLIZATION Right 10/12/2016   Procedure: Embolization;  Surgeon: [name], MD;  Location: [facility];  Service: Cardiovascular;  Laterality: Right;  . etopic  0932,6712  . INCISION AND DRAINAGE OF WOUND Right 03/30/2017   Procedure: IRRIGATION AND DEBRIDEMENT WOUND; VAC PLACEMENT;  Surgeon: [name], DO;  Location: [facility];  Service: Plastics;  Laterality: Right;  . TONSILLECTOMY      FAMILY HISTORY: Family History  Problem Relation Age of Onset  . Dementia Mother   . Aortic stenosis Mother   . Osteoporosis Mother   . Dementia Father   . Diabetes Father   . Hyperlipidemia Father   . Hypertension Father   . Aneurysm Sister   . Hyperlipidemia Sister   . Hypertension Sister   . Diabetes Sister   . Breast cancer Neg Hx     ADVANCED DIRECTIVES (Y/N):  N  HEALTH MAINTENANCE: Social History  Tobacco Use  . Smoking status: Former Smoker    Packs/day: 0.50    Years: 25.00    Pack years: 12.50    Types: Cigarettes    Start date: 09/05/1972    Last attempt to quit: 08/03/1998    Years since quitting: 20.5  . Smokeless tobacco: Never Used  . Tobacco comment: smoking cessation materials not required  Substance Use Topics  . Alcohol use: No    Alcohol/week: 0.0 standard drinks  . Drug use: No     Colonoscopy:  PAP:   Bone density:  Lipid panel:  Allergies  Allergen Reactions  . Penicillins Anaphylaxis    Has patient had a PCN reaction causing immediate rash, facial/tongue/throat swelling, SOB or lightheadedness with hypotension: Yes Has patient had a PCN reaction causing severe rash involving mucus membranes or skin necrosis: No Has patient had a PCN reaction that required hospitalization: No Has patient had a PCN reaction occurring within the last 10 years: No If all of the above answers are "NO", then may proceed with Cephalosporin use.   . Codeine Nausea And Vomiting  . Latex Itching  . Sulfa Antibiotics Other (See Comments)    TOPICAL-SULFA CAUSED BLISTERS  . Tape Other (See Comments)    Reaction:  Blisters and burning  Pt states that paper tape is okay.    [address] Hcl] Anxiety    Current Outpatient Medications  Medication Sig Dispense Refill  . acidophilus (RISAQUAD) CAPS capsule Take 3 capsules by mouth at bedtime.    . cholecalciferol (VITAMIN D) 1000 units tablet Take 1,000 Units by mouth 2 (two) times daily.    . Coenzyme Q10 (COQ10) 400 MG CAPS Take 400 mg by mouth 2 (two) times daily.     . diazepam (VALIUM) 5 MG tablet Take 1 tablet (5 mg total) by mouth daily as needed for anxiety. 30 minutes before doctor's visit 30 tablet 0  . Digestive Enzyme CAPS Take 1 capsule by mouth 3 (three) times daily after meals.     Marland Kitchen ELDERBERRY PO Take by mouth. Every Winter    . Fluticasone-Umeclidin-Vilant (TRELEGY ELLIPTA) 100-62.5-25 MCG/INH AEPB Inhale 1 puff into the lungs daily. 180 each 0  . Ginkgo Biloba Extract (GNP GINGKO BILOBA EXTRACT) 60 MG CAPS Take 60 mg by mouth as needed.     . hydrochlorothiazide (HYDRODIURIL) 12.5 MG tablet Take 1 tablet (12.5 mg total) by mouth daily. 90 tablet 0  . magnesium 30 MG tablet Take 60 mg by mouth daily.     . Multiple Vitamin (MULTIVITAMIN) capsule Take 2 capsules by mouth daily.     Marland Kitchen OVER THE COUNTER MEDICATION Take 1 tablet by  mouth 2 (two) times daily. Mushroom Formula - Stamets 7    . OVER THE COUNTER MEDICATION Take 1 tablet by mouth 2 (two) times daily. Nutri-Calm    . OVER THE COUNTER MEDICATION Place 1 drop under the tongue daily. colloidal silver    . Spirulina 500 MG TABS Take 500 mg by mouth daily.     Marland Kitchen UNABLE TO FIND Med Name: Alinda Dooms  Only with radiation    . UNABLE TO FIND Med Name: liver care    . UNABLE TO FIND Take 2 capsules by mouth daily. Nucleo Immune    . UNABLE TO FIND 2 capsules 2 (two) times daily. Alloicidin supplement    . VENTOLIN HFA 108 (90 Base) MCG/ACT inhaler TAKE 2 PUFFS BY MOUTH EVERY 6 HOURS AS NEEDED FOR WHEEZE OR SHORTNESS  OF BREATH 18 Inhaler 0   No current facility-administered medications for this visit.     OBJECTIVE: Vitals:   02/11/19 1011  BP: (!) 193/114  Pulse: 97  Temp: (!) 97.1 F (36.2 C)     Body mass index is 21.52 kg/m.    ECOG FS:0 - Asymptomatic  General: Well-developed, well-nourished, no acute distress. Eyes: Pink conjunctiva, anicteric sclera. HEENT: Normocephalic, moist mucous membranes. Lungs: Clear to auscultation bilaterally. Heart: Regular rate and rhythm. No rubs, murmurs, or gallops. Abdomen: Soft, nontender, nondistended. No organomegaly noted, normoactive bowel sounds. Musculoskeletal: No edema, cyanosis, or clubbing. Neuro: Alert, answering all questions appropriately. Cranial nerves grossly intact. Skin: No rashes or petechiae noted.  Well-healed surgical scar on right leg with significant postoperative deformity. Psych: Normal affect.  LAB RESULTS:  Lab Results  Component Value Date   NA 134 (L) 02/11/2019   K 4.0 02/11/2019   CL 98 02/11/2019   CO2 26 02/11/2019   GLUCOSE 106 (H) 02/11/2019   BUN 17 02/11/2019   CREATININE 0.72 02/11/2019   CALCIUM 9.4 02/11/2019   PROT 7.6 02/11/2019   ALBUMIN 4.4 02/11/2019   AST 19 02/11/2019   ALT 16 02/11/2019   ALKPHOS 62 02/11/2019   BILITOT 0.8 02/11/2019   GFRNONAA >60  02/11/2019   GFRAA >60 02/11/2019    Lab Results  Component Value Date   WBC 8.8 02/11/2019   NEUTROABS 5.5 02/11/2019   HGB 14.7 02/11/2019   HCT 43.3 02/11/2019   MCV 87.8 02/11/2019   PLT 269 02/11/2019   Lab Results  Component Value Date   IRON 71 01/09/2017   TIBC 225 (L) 01/09/2017   IRONPCTSAT 32 (H) 01/09/2017   Lab Results  Component Value Date   FERRITIN 58 01/09/2017    STUDIES: No results found.  ASSESSMENT: Iron deficiency anemia, locally advanced basal cell carcinoma of the right leg.  PLAN:    1. Iron deficiency anemia: Resolved.  Patient last received Feraheme on Jan 28, 2017.     2. Basal cell carcinoma of the right leg: Resolved without evidence of recurrence.  Patient stated this lesion grew for 9 years and she did not seek medical attention. Diagnosis confirmed by biopsy from surgery.  Patient underwent embolization as well as to neoadjuvant treatment with vismodegib (Erivedge)150 mg daily, a hedgehog inhibitor. Patient underwent complete resection of residual malignancy on March 29, 2017.  Previously, adjuvant chemotherapy and XRT was discussed, but given widely clear margins on surgical resections this was determined not to be necessary.  No further interventions are needed at this time.   3. Hypertension: Chronic and unchanged.  Patient's blood pressure remains significantly elevated.  Continue monitoring and treatment per primary care. 4.  Pulmonary nodules: CT scan results from June 20, 2018 reviewed independently with stable pulmonary nodules.  Repeat in October 2020 with follow-up 1 to 2 days later.  At which point, patient likely can be transitioned to yearly visits.    I spent a total of 20 minutes face-to-face with the patient of which greater than 50% of the visit was spent in counseling and coordination of care as detailed above.  Patient expressed understanding and was in agreement with this plan. She also understands that She can call clinic  at any time with any questions, concerns, or complaints.   Cancer Staging Basal cell carcinoma, leg, right Staging form: Melanoma of the Skin, AJCC 7th Edition - Clinical: No stage assigned - Unsigned   Lloyd Huger, MD  02/11/2019 12:11 PM

## 2019-02-11 ENCOUNTER — Encounter: Payer: Self-pay | Admitting: Oncology

## 2019-02-11 ENCOUNTER — Inpatient Hospital Stay: Payer: Medicare HMO | Attending: Oncology

## 2019-02-11 ENCOUNTER — Other Ambulatory Visit: Payer: Self-pay

## 2019-02-11 ENCOUNTER — Inpatient Hospital Stay (HOSPITAL_BASED_OUTPATIENT_CLINIC_OR_DEPARTMENT_OTHER): Payer: Medicare HMO | Admitting: Oncology

## 2019-02-11 VITALS — BP 193/114 | HR 97 | Temp 97.1°F | Ht 63.0 in | Wt 121.5 lb

## 2019-02-11 DIAGNOSIS — Z85828 Personal history of other malignant neoplasm of skin: Secondary | ICD-10-CM | POA: Diagnosis not present

## 2019-02-11 DIAGNOSIS — I10 Essential (primary) hypertension: Secondary | ICD-10-CM

## 2019-02-11 DIAGNOSIS — R911 Solitary pulmonary nodule: Secondary | ICD-10-CM | POA: Diagnosis not present

## 2019-02-11 DIAGNOSIS — D500 Iron deficiency anemia secondary to blood loss (chronic): Secondary | ICD-10-CM

## 2019-02-11 DIAGNOSIS — C44712 Basal cell carcinoma of skin of right lower limb, including hip: Secondary | ICD-10-CM

## 2019-02-11 LAB — COMPREHENSIVE METABOLIC PANEL
ALT: 16 U/L (ref 0–44)
AST: 19 U/L (ref 15–41)
Albumin: 4.4 g/dL (ref 3.5–5.0)
Alkaline Phosphatase: 62 U/L (ref 38–126)
Anion gap: 10 (ref 5–15)
BUN: 17 mg/dL (ref 8–23)
CO2: 26 mmol/L (ref 22–32)
Calcium: 9.4 mg/dL (ref 8.9–10.3)
Chloride: 98 mmol/L (ref 98–111)
Creatinine, Ser: 0.72 mg/dL (ref 0.44–1.00)
GFR calc Af Amer: >60 mL/min (ref >60)
GFR calc non Af Amer: >60 mL/min (ref >60)
Glucose, Bld: 106 mg/dL — ABNORMAL HIGH (ref 70–99)
Potassium: 4 mmol/L (ref 3.5–5.1)
Sodium: 134 mmol/L — ABNORMAL LOW (ref 135–145)
Total Bilirubin: 0.8 mg/dL (ref 0.3–1.2)
Total Protein: 7.6 g/dL (ref 6.5–8.1)

## 2019-02-11 LAB — CBC WITH DIFFERENTIAL/PLATELET
Abs Immature Granulocytes: 0.03 10*3/uL (ref 0.00–0.07)
Basophils Absolute: 0.1 10*3/uL (ref 0.0–0.1)
Basophils Relative: 1 %
Eosinophils Absolute: 0.3 10*3/uL (ref 0.0–0.5)
Eosinophils Relative: 3 %
HCT: 43.3 % (ref 36.0–46.0)
Hemoglobin: 14.7 g/dL (ref 12.0–15.0)
Immature Granulocytes: 0 %
Lymphocytes Relative: 23 %
Lymphs Abs: 2 10*3/uL (ref 0.7–4.0)
MCH: 29.8 pg (ref 26.0–34.0)
MCHC: 33.9 g/dL (ref 30.0–36.0)
MCV: 87.8 fL (ref 80.0–100.0)
Monocytes Absolute: 0.9 10*3/uL (ref 0.1–1.0)
Monocytes Relative: 11 %
Neutro Abs: 5.5 10*3/uL (ref 1.7–7.7)
Neutrophils Relative %: 62 %
Platelets: 269 10*3/uL (ref 150–400)
RBC: 4.93 MIL/uL (ref 3.87–5.11)
RDW: 13.3 % (ref 11.5–15.5)
WBC: 8.8 10*3/uL (ref 4.0–10.5)
nRBC: 0 % (ref 0.0–0.2)

## 2019-02-11 LAB — MAGNESIUM: Magnesium: 2.2 mg/dL (ref 1.7–2.4)

## 2019-02-11 NOTE — Progress Notes (Signed)
Patient stated that she had been doing well with no complaints. 

## 2019-02-12 ENCOUNTER — Other Ambulatory Visit: Payer: Self-pay | Admitting: Family Medicine

## 2019-02-12 DIAGNOSIS — E785 Hyperlipidemia, unspecified: Secondary | ICD-10-CM | POA: Diagnosis not present

## 2019-02-12 DIAGNOSIS — R739 Hyperglycemia, unspecified: Secondary | ICD-10-CM | POA: Diagnosis not present

## 2019-02-12 DIAGNOSIS — Z79899 Other long term (current) drug therapy: Secondary | ICD-10-CM | POA: Diagnosis not present

## 2019-02-12 DIAGNOSIS — J449 Chronic obstructive pulmonary disease, unspecified: Secondary | ICD-10-CM

## 2019-02-13 LAB — BASIC METABOLIC PANEL WITH GFR
BUN: 18 mg/dL (ref 7–25)
CHLORIDE: 101 mmol/L (ref 98–110)
CO2: 24 mmol/L (ref 20–32)
CREATININE: 0.75 mg/dL (ref 0.50–0.99)
Calcium: 9.6 mg/dL (ref 8.6–10.4)
GFR, EST AFRICAN AMERICAN: 96 mL/min/{1.73_m2} (ref >=60)
GFR, Est Non African American: 82 mL/min/{1.73_m2} (ref >=60)
Glucose, Bld: 96 mg/dL (ref 65–99)
Potassium: 4.3 mmol/L (ref 3.5–5.3)
Sodium: 136 mmol/L (ref 135–146)

## 2019-02-13 LAB — LIPID PANEL
CHOLESTEROL: 260 mg/dL — AB (ref <200)
HDL: 80 mg/dL (ref >=50)
LDL Cholesterol (Calc): 164 mg/dL (calc) — ABNORMAL HIGH
NON-HDL CHOLESTEROL (CALC): 180 mg/dL — AB (ref <130)
Total CHOL/HDL Ratio: 3.3 (calc) (ref <5.0)
Triglycerides: 64 mg/dL (ref <150)

## 2019-02-13 LAB — HEMOGLOBIN A1C
EAG (MMOL/L): 5.8 (calc)
Hgb A1c MFr Bld: 5.3 % of total Hgb (ref <5.7)
Mean Plasma Glucose: 105 (calc)

## 2019-02-14 ENCOUNTER — Other Ambulatory Visit: Payer: Self-pay | Admitting: Family Medicine

## 2019-02-14 DIAGNOSIS — I10 Essential (primary) hypertension: Secondary | ICD-10-CM

## 2019-02-19 ENCOUNTER — Ambulatory Visit: Payer: Self-pay

## 2019-02-19 NOTE — Chronic Care Management (AMB) (Signed)
   Chronic Care Management   Unsuccessful Call Note 02/19/2019 Name: Marquel Spoto MRN: [id number] DOB: 14-Jun-1951  Rory Montel is a 68 year old female who sees Dr. [name] for primary care. Dr. Ancil Boozer asked the CCM team to consult the patient for chronic case management secondary to medications management, social needs, and disease management education 08/2018. Patient was engaged but lost to follow up. Per EMR review, patient continue to have significant hypertension and dyslipidemia without agreeing to take medications if prescribed.   Was unable to reach patient via telephone today. I have left HIPAA compliant voicemail asking patient to return my call. (unsuccessful outreach #1).   Plan: Will follow-up within 7 business days via telephone.      Janet Decesare E. Rollene Rotunda, RN, BSN Nurse Care Coordinator [facility] Care Management  [phone]

## 2019-03-05 ENCOUNTER — Ambulatory Visit (INDEPENDENT_AMBULATORY_CARE_PROVIDER_SITE_OTHER): Payer: Medicare HMO | Admitting: Family Medicine

## 2019-03-05 ENCOUNTER — Encounter: Payer: Self-pay | Admitting: Family Medicine

## 2019-03-05 ENCOUNTER — Other Ambulatory Visit: Payer: Self-pay

## 2019-03-05 VITALS — BP 180/88 | HR 89 | Temp 97.7°F | Resp 16 | Ht 63.0 in | Wt 123.2 lb

## 2019-03-05 DIAGNOSIS — R69 Illness, unspecified: Secondary | ICD-10-CM | POA: Diagnosis not present

## 2019-03-05 DIAGNOSIS — J432 Centrilobular emphysema: Secondary | ICD-10-CM | POA: Diagnosis not present

## 2019-03-05 DIAGNOSIS — F325 Major depressive disorder, single episode, in full remission: Secondary | ICD-10-CM

## 2019-03-05 DIAGNOSIS — I712 Thoracic aortic aneurysm, without rupture, unspecified: Secondary | ICD-10-CM

## 2019-03-05 DIAGNOSIS — J4540 Moderate persistent asthma, uncomplicated: Secondary | ICD-10-CM

## 2019-03-05 DIAGNOSIS — R911 Solitary pulmonary nodule: Secondary | ICD-10-CM | POA: Diagnosis not present

## 2019-03-05 DIAGNOSIS — K861 Other chronic pancreatitis: Secondary | ICD-10-CM | POA: Diagnosis not present

## 2019-03-05 DIAGNOSIS — Z9889 Other specified postprocedural states: Secondary | ICD-10-CM

## 2019-03-05 DIAGNOSIS — Z85828 Personal history of other malignant neoplasm of skin: Secondary | ICD-10-CM

## 2019-03-05 DIAGNOSIS — J449 Chronic obstructive pulmonary disease, unspecified: Secondary | ICD-10-CM | POA: Diagnosis not present

## 2019-03-05 DIAGNOSIS — I10 Essential (primary) hypertension: Secondary | ICD-10-CM | POA: Diagnosis not present

## 2019-03-05 DIAGNOSIS — E785 Hyperlipidemia, unspecified: Secondary | ICD-10-CM

## 2019-03-05 MED ORDER — BREO ELLIPTA 200-25 MCG/INH IN AEPB: 1.0000 | INHALATION_SPRAY | Freq: Every day | RESPIRATORY_TRACT | 5 refills | Status: DC

## 2019-03-05 MED ORDER — ALBUTEROL SULFATE HFA 108 (90 BASE) MCG/ACT IN AERS: 2.0000 | INHALATION_SPRAY | Freq: Four times a day (QID) | RESPIRATORY_TRACT | 0 refills | Status: DC | PRN

## 2019-03-05 MED ORDER — HYDROCHLOROTHIAZIDE 12.5 MG PO TABS: 12.5000 mg | ORAL_TABLET | Freq: Every day | ORAL | 0 refills | Status: DC

## 2019-03-05 NOTE — Progress Notes (Signed)
Name: Patricia Galloway   MRN: [id number]    DOB: [birth year]   Date:03/05/2019       Progress Note  Subjective  Chief Complaint  Chief Complaint  Patient presents with  . Hypertension  . Asthma  . Anemia    HPI  Basal cell carcinoma right leg:she is doing well, s/p removal and chemotherapy.Her gait is back to normal , full rom of motion. Anemia also resolved  Hyperlipidemia: The 10-year ASCVD risk score Patricia Bussing DC Jr., et al., 2013) is: 17.3%   Values used to calculate the score:     Age: 26 years     Sex: Female     Is Non-Hispanic African American: No     Diabetic: No     Tobacco smoker: No     Systolic Blood Pressure: 628 mmHg     Is BP treated: Yes     HDL Cholesterol: 80 mg/dL     Total Cholesterol: 260 mg/dL  She has other risks but refuses to take statin therapy   HTN with white coat: she started HCTZ Feb 2020, however she feels tired daily since she started medication, discussed 24 hours bp monitor and referral to cardiologist and pulmonologist, however she states even checking her bp at home it makes her nervous, and bp spikes, she has been taking Valium prior to doctor's visits but does not seem to decrease bp reading, explained it may not be white count   Major Depression in remission:she is still doing well, phq 9 is back to normal, only one panic attack since January.   Asthma/COPD: she was switched to Trelegy from [place]. She is concerned about cost, she also states Trelegy did not help as much, so she asked to go back on Breo and does not want to take Incruse. She has noticed more SOB and wheezing since the change. She also has a productive cough. She states wearing a mask has not been helpful. Reviewed CT scan chest done by Dr. Grayland Galloway , he will repeat it in October to monitor lung nodules. She still does not want to see pulmonologist at this time. She has albuterol at home but old rx.   Atherosclerosis aorta: refuses statin therapy also not taking  aspirin. Discussed we may need to drop her bp prior to taking aspirin daily .  Osteoporosis: discussed options, she was seen by Dr Gabriel Carina 03/2018 and was given Alendronate but she states never started medication, she states she has a high calcium diet  Chronic Pancreatitis: found on CT abdomen, denies nausea, vomiting or indigestion  Patient Active Problem List   Diagnosis Date Noted  . Pain in limb 03/29/2017  . Basal cell carcinoma 03/29/2017  . Thoracic aortic aneurysm without rupture ([place]) 10/31/2016  . Atherosclerosis of aorta ([place]) 10/31/2016  . Chronic pancreatitis ([place]) 10/31/2016  . Pulmonary nodule 10/31/2016  . Centrilobular emphysema ([place]) 10/31/2016  . Iron deficiency anemia due to chronic blood loss 10/24/2016  . Basal cell carcinoma, leg, right 08/24/2016  . Hyperglycemia 08/01/2016  . Chronic thoracic back pain 08/04/2015  . Panic attack 08/04/2015  . White coat syndrome without diagnosis of hypertension 08/04/2015  . Major depression, recurrent ([place]) 08/04/2015  . Asthma, moderate persistent, poorly-controlled 08/04/2015    Past Surgical History:  Procedure Laterality Date  . BASAL CELL CARCINOMA EXCISION Right 03/29/2017   Procedure: EXCISION OF RIGHT LEG BASEL CELL CARCINOMA;  Surgeon: Patricia Going, DO;  Location: [facility];  Service: Plastics;  Laterality: Right;  .  COLON SURGERY  2002   colostomy for 10 months after a perfurated sigmoid   . COLOSTOMY REVERSAL  2003  . EMBOLIZATION Right 10/12/2016   Procedure: Embolization;  Surgeon: Patricia Cabal, MD;  Location: [facility];  Service: Cardiovascular;  Laterality: Right;  . etopic  6045,4098  . INCISION AND DRAINAGE OF WOUND Right 03/30/2017   Procedure: IRRIGATION AND DEBRIDEMENT WOUND; VAC PLACEMENT;  Surgeon: Patricia Going, DO;  Location: [facility];  Service: Plastics;  Laterality: Right;  . TONSILLECTOMY      Family History  Problem Relation Age of Onset  . Dementia Mother   .  Aortic stenosis Mother   . Osteoporosis Mother   . Dementia Father   . Diabetes Father   . Hyperlipidemia Father   . Hypertension Father   . Aneurysm Sister   . Hyperlipidemia Sister   . Hypertension Sister   . Diabetes Sister   . Breast cancer Neg Hx     Social History   Socioeconomic History  . Marital status: Married    Spouse name: [name]  . Number of children: 0  . Years of education: Not on file  . Highest education level: Bachelor's degree (e.g., BA, AB, BS)  Occupational History  . Occupation: Retired  Scientific laboratory technician  . Financial resource strain: Not hard at all  . Food insecurity    Worry: Never true    Inability: Never true  . Transportation needs    Medical: No    Non-medical: No  Tobacco Use  . Smoking status: Former Smoker    Packs/day: 0.50    Years: 25.00    Pack years: 12.50    Types: Cigarettes    Start date: 09/05/1972    Quit date: 08/03/1998    Years since quitting: 20.6  . Smokeless tobacco: Never Used  . Tobacco comment: smoking cessation materials not required  Substance and Sexual Activity  . Alcohol use: No    Alcohol/week: 0.0 standard drinks  . Drug use: No  . Sexual activity: Not Currently    Birth control/protection: Post-menopausal  Lifestyle  . Physical activity    Days per week: 2 days    Minutes per session: 60 min  . Stress: Only a little  Relationships  . Social Herbalist on phone: Patient refused    Gets together: Patient refused    Attends religious service: Patient refused    Active member of club or organization: Patient refused    Attends meetings of clubs or organizations: Patient refused    Relationship status: Married  . Intimate partner violence    Fear of current or ex partner: No    Emotionally abused: No    Physically abused: No    Forced sexual activity: No  Other Topics Concern  . Not on file  Social History Narrative  . Not on file     Current Outpatient Medications:  .  albuterol  (VENTOLIN HFA) 108 (90 Base) MCG/ACT inhaler, Inhale 2 puffs into the lungs every 6 (six) hours as needed for wheezing or shortness of breath., Disp: 18 g, Rfl: 0 .  cholecalciferol (VITAMIN D) 1000 units tablet, Take 1,000 Units by mouth 2 (two) times daily., Disp: , Rfl:  .  Coenzyme Q10 (COQ10) 400 MG CAPS, Take 400 mg by mouth 2 (two) times daily. , Disp: , Rfl:  .  diazepam (VALIUM) 5 MG tablet, Take 1 tablet (5 mg total) by mouth daily as needed for  anxiety. 30 minutes before doctor's visit, Disp: 30 tablet, Rfl: 0 .  Digestive Enzyme CAPS, Take 1 capsule by mouth 3 (three) times daily after meals. , Disp: , Rfl:  .  ELDERBERRY PO, Take by mouth. Every Winter, Disp: , Rfl:  .  Ginkgo Biloba Extract (GNP GINGKO BILOBA EXTRACT) 60 MG CAPS, Take 60 mg by mouth as needed. , Disp: , Rfl:  .  hydrochlorothiazide (HYDRODIURIL) 12.5 MG tablet, Take 1 tablet (12.5 mg total) by mouth daily., Disp: 90 tablet, Rfl: 0 .  magnesium 30 MG tablet, Take 60 mg by mouth daily. , Disp: , Rfl:  .  Multiple Vitamin (MULTIVITAMIN) capsule, Take 2 capsules by mouth daily. , Disp: , Rfl:  .  OVER THE COUNTER MEDICATION, Take 1 tablet by mouth 2 (two) times daily. Mushroom Formula - Stamets 7, Disp: , Rfl:  .  OVER THE COUNTER MEDICATION, Take 1 tablet by mouth 2 (two) times daily. Nutri-Calm, Disp: , Rfl:  .  OVER THE COUNTER MEDICATION, Place 1 drop under the tongue daily. colloidal silver, Disp: , Rfl:  .  Spirulina 500 MG TABS, Take 500 mg by mouth daily. , Disp: , Rfl:  .  UNABLE TO FIND, Med Name: Koren Bound Balm  Only with radiation, Disp: , Rfl:  .  UNABLE TO FIND, Med Name: liver care, Disp: , Rfl:  .  UNABLE TO FIND, Take 2 capsules by mouth daily. Nucleo Immune, Disp: , Rfl:  .  UNABLE TO FIND, 2 capsules 2 (two) times daily. Alloicidin supplement, Disp: , Rfl:  .  acidophilus (RISAQUAD) CAPS capsule, Take 3 capsules by mouth at bedtime., Disp: , Rfl:  .  fluticasone furoate-vilanterol (BREO ELLIPTA) 200-25  MCG/INH AEPB, Inhale 1 puff into the lungs daily., Disp: 60 each, Rfl: 5  Allergies  Allergen Reactions  . Penicillins Anaphylaxis    Has patient had a PCN reaction causing immediate rash, facial/tongue/throat swelling, SOB or lightheadedness with hypotension: Yes Has patient had a PCN reaction causing severe rash involving mucus membranes or skin necrosis: No Has patient had a PCN reaction that required hospitalization: No Has patient had a PCN reaction occurring within the last 10 years: No If all of the above answers are "NO", then may proceed with Cephalosporin use.   . Codeine Nausea And Vomiting  . Latex Itching  . Sulfa Antibiotics Other (See Comments)    TOPICAL-SULFA CAUSED BLISTERS  . Tape Other (See Comments)    Reaction:  Blisters and burning  Pt states that paper tape is okay.    [address] Hcl] Anxiety    I personally reviewed active problem list, medication list, allergies, family history with the patient/caregiver today.   ROS  Constitutional: Negative for fever or weight change.  Respiratory: Positive  for cough and shortness of breath.   Cardiovascular: Negative for chest pain or palpitations.  Gastrointestinal: Negative for abdominal pain, no bowel changes.  Musculoskeletal: Negative for gait problem or joint swelling.  Skin: Negative for rash.  Neurological: Negative for dizziness or headache.  No other specific complaints in a complete review of systems (except as listed in HPI above).  Objective  Vitals:   03/05/19 1148 03/05/19 1236  BP: (!) 180/80 (!) 180/88  Pulse: 89   Resp: 16   Temp: 97.7 F (36.5 C)   TempSrc: Oral   SpO2: 98%   Weight: 123 lb 3.2 oz (55.9 kg)   Height: 5\' 3"  (1.6 m)     Body mass index is  21.82 kg/m.  Physical Exam  Constitutional: Patient appears well-developed and well-nourished. No distress.  HEENT: head atraumatic, normocephalic, pupils equal and reactive to light,  neck supple, oral exam not done   Cardiovascular: Normal rate, regular rhythm and normal heart sounds.  No murmur heard. No BLE edema. Pulmonary/Chest: Effort normal and breath sounds normal. No respiratory distress. Abdominal: Soft.  There is no tenderness. Psychiatric: Patient has a normal mood and affect. behavior is normal. Judgment and thought content normal.  Recent Results (from the past 2160 hour(s))  Magnesium     Status: None   Collection Time: 02/11/19  9:55 AM  Result Value Ref Range   Magnesium 2.2 1.7 - 2.4 mg/dL    Comment: Performed at [facility], [address] [place], [place] [address]  Comprehensive metabolic panel     Status: Abnormal   Collection Time: 02/11/19  9:55 AM  Result Value Ref Range   Sodium 134 (L) 135 - 145 mmol/L   Potassium 4.0 3.5 - 5.1 mmol/L   Chloride 98 98 - 111 mmol/L   CO2 26 22 - 32 mmol/L   Glucose, Bld 106 (H) 70 - 99 mg/dL   BUN 17 8 - 23 mg/dL   Creatinine, Ser 0.72 0.44 - 1.00 mg/dL   Calcium 9.4 8.9 - 10.3 mg/dL   Total Protein 7.6 6.5 - 8.1 g/dL   Albumin 4.4 3.5 - 5.0 g/dL   AST 19 15 - 41 U/L   ALT 16 0 - 44 U/L   Alkaline Phosphatase 62 38 - 126 U/L   Total Bilirubin 0.8 0.3 - 1.2 mg/dL   GFR calc non Af Amer >60 >60 mL/min   GFR calc Af Amer >60 >60 mL/min   Anion gap 10 5 - 15    Comment: Performed at [facility], [address] [place], [place] [address]  CBC with Differential/Platelet     Status: None   Collection Time: 02/11/19  9:55 AM  Result Value Ref Range   WBC 8.8 4.0 - 10.5 K/uL   RBC 4.93 3.87 - 5.11 MIL/uL   Hemoglobin 14.7 12.0 - 15.0 g/dL   HCT 43.3 36.0 - 46.0 %   MCV 87.8 80.0 - 100.0 fL   MCH 29.8 26.0 - 34.0 pg   MCHC 33.9 30.0 - 36.0 g/dL   RDW 13.3 11.5 - 15.5 %   Platelets 269 150 - 400 K/uL   nRBC 0.0 0.0 - 0.2 %   Neutrophils Relative % 62 %   Neutro Abs 5.5 1.7 - 7.7 K/uL   Lymphocytes Relative 23 %   Lymphs Abs 2.0 0.7 - 4.0 K/uL   Monocytes Relative 11 %   Monocytes Absolute 0.9 0.1 - 1.0 K/uL    Eosinophils Relative 3 %   Eosinophils Absolute 0.3 0.0 - 0.5 K/uL   Basophils Relative 1 %   Basophils Absolute 0.1 0.0 - 0.1 K/uL   Immature Granulocytes 0 %   Abs Immature Granulocytes 0.03 0.00 - 0.07 K/uL    Comment: Performed at [facility], [address] [place], [place] [address]  Lipid panel     Status: Abnormal   Collection Time: 02/12/19 10:08 AM  Result Value Ref Range   Cholesterol 260 (H) <200 mg/dL   HDL 80 > OR = 50 mg/dL   Triglycerides 64 <150 mg/dL   LDL Cholesterol (Calc) 164 (H) mg/dL (calc)    Comment: Reference range: <100 . Desirable range <100 mg/dL for primary prevention;   <  70 mg/dL for patients with CHD or diabetic patients  with > or = 2 CHD risk factors. Marland Kitchen LDL-C is now calculated using the Martin-Hopkins  calculation, which is a validated novel method providing  better accuracy than the Friedewald equation in the  estimation of LDL-C.  Cresenciano Genre et al. Annamaria Helling. 7654;650(35): 2061-2068  (http://education.[facility].com/faq/FAQ164)    Total CHOL/HDL Ratio 3.3 <5.0 (calc)   Non-HDL Cholesterol (Calc) 180 (H) <130 mg/dL (calc)    Comment: For patients with diabetes plus 1 major ASCVD risk  factor, treating to a non-HDL-C goal of <100 mg/dL  (LDL-C of <70 mg/dL) is considered a therapeutic  option.   Hemoglobin A1c     Status: None   Collection Time: 02/12/19 10:08 AM  Result Value Ref Range   Hgb A1c MFr Bld 5.3 <5.7 % of total Hgb    Comment: For the purpose of screening for the presence of diabetes: . <5.7%       Consistent with the absence of diabetes 5.7-6.4%    Consistent with increased risk for diabetes             (prediabetes) > or =6.5%  Consistent with diabetes . This assay result is consistent with a decreased risk of diabetes. . Currently, no consensus exists regarding use of hemoglobin A1c for diagnosis of diabetes in children. . According to American Diabetes Association (ADA) guidelines, hemoglobin A1c <7.0%  represents optimal control in non-pregnant diabetic patients. Different metrics may apply to specific patient populations.  Standards of Medical Care in Diabetes(ADA). .    Mean Plasma Glucose 105 (calc)   eAG (mmol/L) 5.8 (calc)  BASIC METABOLIC PANEL WITH GFR     Status: None   Collection Time: 02/12/19 10:08 AM  Result Value Ref Range   Glucose, Bld 96 65 - 99 mg/dL    Comment: .            Fasting reference interval .    BUN 18 7 - 25 mg/dL   Creat 0.75 0.50 - 0.99 mg/dL    Comment: For patients >61 years of age, the reference limit for Creatinine is approximately 13% higher for people identified as African-American. .    GFR, Est Non African American 82 > OR = 60 mL/min/1.2m2   GFR, Est African American 96 > OR = 60 mL/min/1.63m2   BUN/Creatinine Ratio NOT APPLICABLE 6 - 22 (calc)   Sodium 136 135 - 146 mmol/L   Potassium 4.3 3.5 - 5.3 mmol/L   Chloride 101 98 - 110 mmol/L   CO2 24 20 - 32 mmol/L   Calcium 9.6 8.6 - 10.4 mg/dL      PHQ2/9: Depression screen [facility] 2/9 03/05/2019 11/16/2018 08/16/2018 12/26/2017 10/31/2016  Decreased Interest 0 0 0 0 0  Down, Depressed, Hopeless 0 0 0 1 0  PHQ - 2 Score 0 0 0 1 0  Altered sleeping 0 1 0 0 -  Tired, decreased energy 2 1 1  0 -  Change in appetite 0 0 0 0 -  Feeling bad or failure about yourself  0 0 0 0 -  Trouble concentrating 0 0 0 0 -  Moving slowly or fidgety/restless 0 0 0 0 -  Suicidal thoughts 0 0 0 0 -  PHQ-9 Score 2 2 1 1  -  Difficult doing work/chores Not difficult at all Not difficult at all Somewhat difficult Not difficult at all -    phq 9 is negative   Fall Risk: Fall Risk  03/05/2019  11/16/2018 08/16/2018 12/26/2017 10/31/2016  Falls in the past year? 0 0 0 Yes No  Comment - - - fell on ice -  Number falls in past yr: 0 0 - 1 -  Injury with Fall? 0 0 - No -  Follow up - - - Falls evaluation completed;Education provided;Falls prevention discussed -      Functional Status Survey: Is the patient  deaf or have difficulty hearing?: No Does the patient have difficulty seeing, even when wearing glasses/contacts?: No Does the patient have difficulty concentrating, remembering, or making decisions?: No Does the patient have difficulty walking or climbing stairs?: No Does the patient have difficulty dressing or bathing?: No Does the patient have difficulty doing errands alone such as visiting a doctor's office or shopping?: No    Assessment & Plan  1. Thoracic aortic aneurysm without rupture (HCC)  Refuses statin therapy   2. Centrilobular emphysema ([place])  Getting worse but does not want to see pulmonologist   3. History of basal cell carcinoma (BCC) excision  - Ambulatory referral to Dermatology  4. Chronic pancreatitis, unspecified pancreatitis type ([place])   5. COPD with asthma ([place])  - albuterol (VENTOLIN HFA) 108 (90 Base) MCG/ACT inhaler; Inhale 2 puffs into the lungs every 6 (six) hours as needed for wheezing or shortness of breath.  Dispense: 18 g; Refill: 0 - fluticasone furoate-vilanterol (BREO ELLIPTA) 200-25 MCG/INH AEPB; Inhale 1 puff into the lungs daily.  Dispense: 60 each; Refill: 5  6. Dyslipidemia   7. Pulmonary nodule, left   8. Depression, major, in remission ([place])  Doing well   9. White coat syndrome with diagnosis of hypertension  On HCTZ but does not want to add any other medication   - hydrochlorothiazide (HYDRODIURIL) 12.5 MG tablet; Take 1 tablet (12.5 mg total) by mouth daily.  Dispense: 90 tablet; Refill: 0  10. Uncontrolled hypertension  - hydrochlorothiazide (HYDRODIURIL) 12.5 MG tablet; Take 1 tablet (12.5 mg total) by mouth daily.  Dispense: 90 tablet; Refill: 0  11. Asthma, moderate persistent, poorly-controlled  - albuterol (VENTOLIN HFA) 108 (90 Base) MCG/ACT inhaler; Inhale 2 puffs into the lungs every 6 (six) hours as needed for wheezing or shortness of breath.  Dispense: 18 g; Refill: 0 - fluticasone furoate-vilanterol (BREO  ELLIPTA) 200-25 MCG/INH AEPB; Inhale 1 puff into the lungs daily.  Dispense: 60 each; Refill: 5

## 2019-04-08 DIAGNOSIS — M810 Age-related osteoporosis without current pathological fracture: Secondary | ICD-10-CM | POA: Diagnosis not present

## 2019-04-15 ENCOUNTER — Telehealth: Payer: Self-pay

## 2019-04-15 NOTE — Telephone Encounter (Signed)
Copied from [place] [id number]. Topic: General - Inquiry >> Apr 11, 2019  4:00 PM Richardo Priest, [place] wrote: Reason for CRM: [facility]ib[facility] Billings called in stating that office is needing the demographical information on the patient. Also in need of office notes in regards to referral. Please advise as [facility]ib[facility] Billings is needing this information to set patient up with an appointment. Fax is [phone].   Information has been sent as requested.

## 2019-04-25 DIAGNOSIS — L578 Other skin changes due to chronic exposure to nonionizing radiation: Secondary | ICD-10-CM | POA: Diagnosis not present

## 2019-04-25 DIAGNOSIS — D2230 Melanocytic nevi of unspecified part of face: Secondary | ICD-10-CM | POA: Diagnosis not present

## 2019-04-25 DIAGNOSIS — L821 Other seborrheic keratosis: Secondary | ICD-10-CM | POA: Diagnosis not present

## 2019-04-25 DIAGNOSIS — Z1283 Encounter for screening for malignant neoplasm of skin: Secondary | ICD-10-CM | POA: Diagnosis not present

## 2019-04-25 DIAGNOSIS — L720 Epidermal cyst: Secondary | ICD-10-CM | POA: Diagnosis not present

## 2019-04-25 DIAGNOSIS — D225 Melanocytic nevi of trunk: Secondary | ICD-10-CM | POA: Diagnosis not present

## 2019-04-25 DIAGNOSIS — D2261 Melanocytic nevi of right upper limb, including shoulder: Secondary | ICD-10-CM | POA: Diagnosis not present

## 2019-04-25 DIAGNOSIS — D1800 Hemangioma unspecified site: Secondary | ICD-10-CM | POA: Diagnosis not present

## 2019-04-25 DIAGNOSIS — L820 Inflamed seborrheic keratosis: Secondary | ICD-10-CM | POA: Diagnosis not present

## 2019-04-25 DIAGNOSIS — D485 Neoplasm of uncertain behavior of skin: Secondary | ICD-10-CM | POA: Diagnosis not present

## 2019-04-25 DIAGNOSIS — Z85828 Personal history of other malignant neoplasm of skin: Secondary | ICD-10-CM | POA: Diagnosis not present

## 2019-06-17 ENCOUNTER — Ambulatory Visit
Admission: RE | Admit: 2019-06-17 | Discharge: 2019-06-17 | Disposition: A | Discharge status: Home / Self Care | Payer: Medicare HMO | Source: Ambulatory Visit | Attending: Oncology | Admitting: Oncology

## 2019-06-17 ENCOUNTER — Other Ambulatory Visit: Payer: Self-pay

## 2019-06-17 ENCOUNTER — Other Ambulatory Visit: Payer: Self-pay | Admitting: Oncology

## 2019-06-17 DIAGNOSIS — C44712 Basal cell carcinoma of skin of right lower limb, including hip: Secondary | ICD-10-CM | POA: Insufficient documentation

## 2019-06-17 LAB — POCT I-STAT CREATININE: Creatinine, Ser: 0.8 mg/dL (ref 0.44–1.00)

## 2019-06-17 MED ORDER — IOHEXOL 300 MG/ML  SOLN
75.0000 mL | Freq: Once | INTRAMUSCULAR | Status: AC | PRN
  Administered 2019-06-17: 60 mL via INTRAVENOUS

## 2019-06-17 NOTE — Progress Notes (Signed)
[place]  [phone] [phone]  ID: Jacqlyn Krauss OB: [birth year]  MR#: [id number]  [id number]  Patient Care Team: [name], MD as PCP - General (Family Medicine) Bary Castilla, Forest Gleason, MD (General Surgery) Lloyd Huger, MD as Consulting Physician (Oncology) Dillingham, Loel Lofty, DO as Attending Physician (Plastic Surgery) Cathi Roan, [facility] (Pharmacist) Benedetto Goad, RN as Case Manager  CHIEF COMPLAINT: Iron deficiency anemia, locally advanced basal cell carcinoma of the right leg, pulmonary nodules..  INTERVAL HISTORY: Patient returns to clinic today for routine 50-month evaluation and discussion of her imaging results.  She continues to feel well and remains asymptomatic. She has no neurologic complaints. She denies any recent fevers or illnesses.  She has a good appetite and denies weight loss.  She denies any chest pain, shortness of breath, cough, or hemoptysis.  She denies any nausea, vomiting, constipation, or diarrhea.  She has no melena or hematochezia.  She has no urinary complaints.  Patient feels at her baseline offers no specific complaints today.    REVIEW OF SYSTEMS:   Review of Systems  Constitutional: Negative for fever, malaise/fatigue and weight loss.  Respiratory: Negative.  Negative for cough and shortness of breath.   Cardiovascular: Negative.  Negative for chest pain and leg swelling.  Gastrointestinal: Negative.  Negative for abdominal pain, nausea and vomiting.  Genitourinary: Negative.  Negative for dysuria.  Musculoskeletal: Negative.  Negative for myalgias.  Skin: Negative.  Negative for rash.  Neurological: Negative.  Negative for sensory change, focal weakness and weakness.  Psychiatric/Behavioral: Negative.  The patient is not nervous/anxious.     As per HPI. Otherwise, a complete review of systems is negative.  PAST MEDICAL HISTORY: Past Medical History:  Diagnosis Date  . Anemia    has had iron infusion  . Anxiety    complicated grief  . Aortic aneurysm ([place])    found on CT in December, 2017  . Asthma    as child , but no episodes in over 30 years  . Basal cell carcinoma   . Cancer ([place]) 08/2016   basal cell carcinoma - right leg  . History of transfusion of packed red blood cells     PAST SURGICAL HISTORY: Past Surgical History:  Procedure Laterality Date  . BASAL CELL CARCINOMA EXCISION Right 03/29/2017   Procedure: EXCISION OF RIGHT LEG BASEL CELL CARCINOMA;  Surgeon: [name], DO;  Location: [facility];  Service: Plastics;  Laterality: Right;  . COLON SURGERY  2002   colostomy for 10 months after a perfurated sigmoid   . COLOSTOMY REVERSAL  2003  . EMBOLIZATION Right 10/12/2016   Procedure: Embolization;  Surgeon: [name], MD;  Location: [facility];  Service: Cardiovascular;  Laterality: Right;  . etopic  [id number]  . INCISION AND DRAINAGE OF WOUND Right 03/30/2017   Procedure: IRRIGATION AND DEBRIDEMENT WOUND; VAC PLACEMENT;  Surgeon: [name], DO;  Location: [facility];  Service: Plastics;  Laterality: Right;  . TONSILLECTOMY      FAMILY HISTORY: Family History  Problem Relation Age of Onset  . Dementia Mother   . Aortic stenosis Mother   . Osteoporosis Mother   . Dementia Father   . Diabetes Father   . Hyperlipidemia Father   . Hypertension Father   . Aneurysm Sister   . Hyperlipidemia Sister   . Hypertension Sister   . Diabetes Sister   . Breast cancer Neg Hx     ADVANCED DIRECTIVES (  Y/N):  N  HEALTH MAINTENANCE: Social History   Tobacco Use  . Smoking status: Former Smoker    Packs/day: 0.50    Years: 25.00    Pack years: 12.50    Types: Cigarettes    Start date: 09/05/1972    Quit date: 08/03/1998    Years since quitting: 20.8  . Smokeless tobacco: Never Used  . Tobacco comment: smoking cessation materials not required  Substance Use Topics  . Alcohol use: No    Alcohol/week: 0.0 standard  drinks  . Drug use: No     Colonoscopy:  PAP:  Bone density:  Lipid panel:  Allergies  Allergen Reactions  . Penicillins Anaphylaxis    Has patient had a PCN reaction causing immediate rash, facial/tongue/throat swelling, SOB or lightheadedness with hypotension: Yes Has patient had a PCN reaction causing severe rash involving mucus membranes or skin necrosis: No Has patient had a PCN reaction that required hospitalization: No Has patient had a PCN reaction occurring within the last 10 years: No If all of the above answers are "NO", then may proceed with Cephalosporin use.   . Codeine Nausea And Vomiting  . Latex Itching  . Sulfa Antibiotics Other (See Comments)    TOPICAL-SULFA CAUSED BLISTERS  . Tape Other (See Comments)    Reaction:  Blisters and burning  Pt states that paper tape is okay.    [address] Hcl] Anxiety    Current Outpatient Medications  Medication Sig Dispense Refill  . acidophilus (RISAQUAD) CAPS capsule Take 3 capsules by mouth at bedtime.    Marland Kitchen albuterol (VENTOLIN HFA) 108 (90 Base) MCG/ACT inhaler Inhale 2 puffs into the lungs every 6 (six) hours as needed for wheezing or shortness of breath. 18 g 0  . cholecalciferol (VITAMIN D) 1000 units tablet Take 1,000 Units by mouth 2 (two) times daily.    . Coenzyme Q10 (COQ10) 400 MG CAPS Take 400 mg by mouth 2 (two) times daily.     . diazepam (VALIUM) 5 MG tablet Take 1 tablet (5 mg total) by mouth daily as needed for anxiety. 30 minutes before doctor's visit 30 tablet 0  . ELDERBERRY PO Take by mouth. Every Winter    . fluticasone furoate-vilanterol (BREO ELLIPTA) 200-25 MCG/INH AEPB Inhale 1 puff into the lungs daily. 60 each 5  . Ginkgo Biloba Extract (GNP GINGKO BILOBA EXTRACT) 60 MG CAPS Take 60 mg by mouth as needed.     . hydrochlorothiazide (HYDRODIURIL) 12.5 MG tablet Take 1 tablet (12.5 mg total) by mouth daily. 90 tablet 0  . magnesium 30 MG tablet Take 60 mg by mouth daily.     . Multiple  Vitamin (MULTIVITAMIN) capsule Take 2 capsules by mouth daily.     Marland Kitchen Spirulina 500 MG TABS Take 500 mg by mouth daily.     Marland Kitchen UNABLE TO FIND Med Name: Alinda Dooms  Only with radiation    . UNABLE TO FIND Med Name: liver care    . Digestive Enzyme CAPS Take 1 capsule by mouth 3 (three) times daily after meals.     Marland Kitchen OVER THE COUNTER MEDICATION Take 1 tablet by mouth 2 (two) times daily. Nutri-Calm     No current facility-administered medications for this visit.     OBJECTIVE: Vitals:   06/20/19 1050  BP: (!) 195/88  Pulse: 94  Temp: (!) 96.1 F (35.6 C)     Body mass index is 22.09 kg/m.    ECOG FS:0 - Asymptomatic  General:  Well-developed, well-nourished, no acute distress. Eyes: Pink conjunctiva, anicteric sclera. HEENT: Normocephalic, moist mucous membranes. Lungs: Clear to auscultation bilaterally. Heart: Regular rate and rhythm. No rubs, murmurs, or gallops. Abdomen: Soft, nontender, nondistended. No organomegaly noted, normoactive bowel sounds. Musculoskeletal: No edema, cyanosis, or clubbing.  Well-healed surgical scar on right leg with significant deformity. Neuro: Alert, answering all questions appropriately. Cranial nerves grossly intact. Skin: No rashes or petechiae noted. Psych: Normal affect.  LAB RESULTS:  Lab Results  Component Value Date   NA 136 02/12/2019   K 4.3 02/12/2019   CL 101 02/12/2019   CO2 24 02/12/2019   GLUCOSE 96 02/12/2019   BUN 18 02/12/2019   CREATININE 0.80 06/17/2019   CALCIUM 9.6 02/12/2019   PROT 7.6 02/11/2019   ALBUMIN 4.4 02/11/2019   AST 19 02/11/2019   ALT 16 02/11/2019   ALKPHOS 62 02/11/2019   BILITOT 0.8 02/11/2019   GFRNONAA 82 02/12/2019   GFRAA 96 02/12/2019    Lab Results  Component Value Date   WBC 8.8 02/11/2019   NEUTROABS 5.5 02/11/2019   HGB 14.7 02/11/2019   HCT 43.3 02/11/2019   MCV 87.8 02/11/2019   PLT 269 02/11/2019   Lab Results  Component Value Date   IRON 71 01/09/2017   TIBC 225 (L)  01/09/2017   IRONPCTSAT 32 (H) 01/09/2017   Lab Results  Component Value Date   FERRITIN 58 01/09/2017    STUDIES: Ct Chest W Contrast  Result Date: 06/17/2019 CLINICAL DATA:  Locally advanced basal cell carcinoma of the right leg. EXAM: CT CHEST WITH CONTRAST TECHNIQUE: Multidetector CT imaging of the chest was performed during intravenous contrast administration. CONTRAST:  35mL OMNIPAQUE IOHEXOL 300 MG/ML  SOLN COMPARISON:  06/20/2018 FINDINGS: Cardiovascular: The heart size is normal. No substantial pericardial effusion. Coronary artery calcification is evident. Atherosclerotic calcification is noted in the wall of the thoracic aorta. Mediastinum/Nodes: No mediastinal lymphadenopathy. There is no hilar lymphadenopathy. There is no axillary lymphadenopathy. The esophagus has normal imaging features. Lungs/Pleura: There is a new 12 mm nodule in the posterior left upper lobe (35/3). 8 mm nodule in the left apex (24/3) is stable. 8 mm left upper lobe nodule on 41/3 is stable. 4 mm posterior right upper lobe nodule (67/3) is unchanged. 11 mm focus of architectural distortion in the right lower lobe (74/3) is stable. No focal airspace consolidation.  No pleural effusion. Upper Abdomen: Tiny hypodensity in the lateral segment left liver is unchanged, suggesting benign etiology. 3 mm nonobstructing stone identified upper pole right kidney. Diffuse parenchymal calcification in the pancreas suggest history of chronic pancreatitis. Musculoskeletal: No worrisome lytic or sclerotic osseous abnormality. IMPRESSION: 1. Interval development of a new 12 mm nodule in the posterior left upper lobe. Primary bronchogenic neoplasm or metastatic disease a concern. PET-CT may be warranted to further evaluate. 2. Multiple additional bilateral pulmonary nodules are stable in the interval. 3.  Aortic Atherosclerois (ICD10-170.0) 4.  Emphysema. [id number]) Electronically Signed   By: Misty Stanley M.D.   On: 06/17/2019 13:03     ASSESSMENT: Iron deficiency anemia, locally advanced basal cell carcinoma of the right leg.  PLAN:    1. Iron deficiency anemia: Resolved.  Patient last received Feraheme on Jan 28, 2017.     2. Basal cell carcinoma of the right leg: Resolved without evidence of recurrence.  Patient stated this lesion grew for 9 years and she did not seek medical attention. Diagnosis confirmed by biopsy from surgery.  Patient underwent embolization as well  as to neoadjuvant treatment with vismodegib (Erivedge)150 mg daily, a hedgehog inhibitor. Patient underwent complete resection of residual malignancy on March 29, 2017.  Previously, adjuvant chemotherapy and XRT was discussed, but given widely clear margins on surgical resections this was determined not to be necessary.  No further interventions are needed at this time.   3. Hypertension: Chronic and unchanged.  Patient's blood pressure remains significantly elevated.  Continue monitoring and treatment per primary care. 4.  Pulmonary nodules: CT scan results from June 17, 2019 reviewed independently and reported as above with previously noted pulmonary nodules to be stable, but patient had interval development of a new 12 mm nodule in the left upper lobe.  Case was discussed at tumor board and recommendation was to pursue PET scan followed by likely navigational bronchoscopy with pulmonary.  Patient will have video assisted telemedicine visit 1 to 2 days after her PET scan to discuss the results.    I spent a total of 30 minutes face-to-face with the patient of which greater than 50% of the visit was spent in counseling and coordination of care as detailed above.  Patient expressed understanding and was in agreement with this plan. She also understands that She can call clinic at any time with any questions, concerns, or complaints.   Cancer Staging Basal cell carcinoma, leg, right Staging form: Melanoma of the Skin, AJCC 7th Edition - Clinical: No stage  assigned - Unsigned   Lloyd Huger, MD   06/22/2019 7:08 AM

## 2019-06-19 ENCOUNTER — Telehealth: Payer: Self-pay

## 2019-06-19 NOTE — Telephone Encounter (Signed)
Pre-visit assessment call was attempted prior to [place] appointment with Dr. Grayland Ormond on 06/20/2019. No answer / left a msg / no call back necessary.

## 2019-06-20 ENCOUNTER — Other Ambulatory Visit: Payer: Self-pay

## 2019-06-20 ENCOUNTER — Encounter: Payer: Self-pay | Admitting: Oncology

## 2019-06-20 ENCOUNTER — Other Ambulatory Visit: Payer: Self-pay | Admitting: *Deleted

## 2019-06-20 ENCOUNTER — Inpatient Hospital Stay: Payer: Medicare HMO | Attending: Oncology | Admitting: Oncology

## 2019-06-20 ENCOUNTER — Inpatient Hospital Stay: Payer: Medicare HMO

## 2019-06-20 VITALS — BP 195/88 | HR 94 | Temp 96.1°F | Wt 124.7 lb

## 2019-06-20 DIAGNOSIS — C44712 Basal cell carcinoma of skin of right lower limb, including hip: Secondary | ICD-10-CM

## 2019-06-20 DIAGNOSIS — Z85828 Personal history of other malignant neoplasm of skin: Secondary | ICD-10-CM | POA: Insufficient documentation

## 2019-06-20 DIAGNOSIS — R911 Solitary pulmonary nodule: Secondary | ICD-10-CM

## 2019-06-20 DIAGNOSIS — I10 Essential (primary) hypertension: Secondary | ICD-10-CM | POA: Diagnosis not present

## 2019-06-20 DIAGNOSIS — D500 Iron deficiency anemia secondary to blood loss (chronic): Secondary | ICD-10-CM

## 2019-06-20 DIAGNOSIS — D509 Iron deficiency anemia, unspecified: Secondary | ICD-10-CM | POA: Diagnosis not present

## 2019-06-20 NOTE — Progress Notes (Signed)
Tumor Board Documentation  Mikhaila Bigby was presented by Dr Grayland Ormond at our Tumor Board on 06/20/2019, which included representatives from medical oncology, radiation oncology, pathology, radiology, surgical, navigation, internal medicine, pharmacy, genetics, pulmonology, palliative care, research.  Patricia Galloway currently presents as a current patient, for discussion with history of the following treatments: active survellience, neoadjuvant chemotherapy.  Additionally, we reviewed previous medical and familial history, history of present illness, and recent lab results along with all available histopathologic and imaging studies. The tumor board considered available treatment options and made the following recommendations: Additional screening PET scan and if positive navigational Bronchoscopy for biopsy  The following procedures/referrals were also placed: No orders of the defined types were placed in this encounter.   Clinical Trial Status: not discussed   Staging used:    National site-specific guidelines   were discussed with respect to the case.  Tumor board is a meeting of clinicians from various specialty areas who evaluate and discuss patients for whom a multidisciplinary approach is being considered. Final determinations in the plan of care are those of the provider(s). The responsibility for follow up of recommendations given during tumor board is that of the provider.   Today's extended care, comprehensive team conference, Arlette was not present for the discussion and was not examined.   Multidisciplinary Tumor Board is a multidisciplinary case peer review process.  Decisions discussed in the Multidisciplinary Tumor Board reflect the opinions of the specialists present at the conference without having examined the patient.  Ultimately, treatment and diagnostic decisions rest with the primary provider(s) and the patient.

## 2019-07-02 ENCOUNTER — Ambulatory Visit (INDEPENDENT_AMBULATORY_CARE_PROVIDER_SITE_OTHER): Payer: Medicare HMO

## 2019-07-02 ENCOUNTER — Other Ambulatory Visit: Payer: Self-pay

## 2019-07-02 VITALS — BP 152/92 | HR 88 | Temp 97.1°F | Ht 63.0 in | Wt 124.8 lb

## 2019-07-02 DIAGNOSIS — Z1231 Encounter for screening mammogram for malignant neoplasm of breast: Secondary | ICD-10-CM

## 2019-07-02 DIAGNOSIS — Z0000 Encounter for general adult medical examination without abnormal findings: Secondary | ICD-10-CM | POA: Diagnosis not present

## 2019-07-02 DIAGNOSIS — M810 Age-related osteoporosis without current pathological fracture: Secondary | ICD-10-CM | POA: Diagnosis not present

## 2019-07-02 NOTE — Progress Notes (Signed)
Subjective:   Patricia Galloway is a 68 y.o. female who presents for Medicare Annual (Subsequent) preventive examination.   Review of Systems:   Cardiac Risk Factors include: advanced age (>73mn, >>103women);hypertension     Objective:     Vitals: BP (!) 152/92 (BP Location: Left Arm, Patient Position: Sitting, Cuff Size: Normal)   Pulse 88   Temp (!) 97.1 F (36.2 C) (Temporal)   Ht 5' 3"  (1.6 m)   Wt 124 lb 12.8 oz (56.6 kg)   SpO2 99%   BMI 22.11 kg/m   Body mass index is 22.11 kg/m.  Advanced Directives 07/02/2019 06/20/2019 02/11/2019 06/28/2018 03/15/2018 12/26/2017 04/25/2017  Does Patient Have a Medical Advance Directive? Yes Yes Yes Yes Yes Yes Yes  Type of AParamedicof [place]psonvilleLiving will HThe VillageLiving will Living will;Healthcare Power of [place]Living will HAm[place]Living will H[place]bineLiving will HMar[place]Living will  Does patient want to make changes to medical advance directive? - - No - Patient declined No - Patient declined - - -  Copy of HSoldier [place] Chart? Yes - validated most recent copy scanned in chart (See row information) - No - copy requested No - copy requested Yes Yes -  Would patient like information on creating a medical advance directive? - - - - - - -    Tobacco Social History   Tobacco Use  Smoking Status Former Smoker  . Packs/day: 0.50  . Years: 25.00  . Pack years: 12.50  . Types: Cigarettes  . Start date: 09/05/1972  . Quit date: 08/03/1998  . Years since quitting: 20.9  Smokeless Tobacco Never Used  Tobacco Comment   smoking cessation materials not required     Counseling given: Not Answered Comment: smoking cessation materials not required   Clinical Intake:  Pre-visit preparation completed: Yes  Pain : No/denies pain     BMI - recorded: 22.11 Nutritional Status: BMI of  19-24  Normal Nutritional Risks: None Diabetes: No  How often do you need to have someone help you when you read instructions, pamphlets, or other written materials from your doctor or pharmacy?: 1 - Never  Interpreter Needed?: No  Information entered by :: KClemetine MarkerLPN  Past Medical History:  Diagnosis Date  . Anemia    has had iron infusion  . Anxiety    complicated grief  . [place]ortic [place]    found [place] in December, 2017  . Asthma    as child , but no episodes in over 30 years  . Basal cell carcinoma   . Cancer ([place] 08/2016   basal cell carcinoma - right leg  . History of transfusion of packed red blood cells    Past Surgical History:  Procedure Laterality Date  . BASAL CELL CARCINOMA EXCISION Right 03/29/2017   Procedure: EXCISION OF RIGHT LEG BASEL CELL CARCINOMA;  Surgeon: DWallace Going DO;  [facility] [facility]  Service: Plastics;  Laterality: Right;  . COLON SURGERY  2002   colostomy for 10 months after a perfurated sigmoid   . COLOSTOMY REVERSAL  2003  . EMBOLIZATION Right 10/12/2016   Procedure: Embolization;  Surgeon: SKatha Cabal MD;  [facility];  Service: Cardiovascular;  Laterality: Right;  . etopic  11610,9604 . INCISION AND DRAINAGE OF WOUND Right 03/30/2017   Procedure: IRRIGATION AND DEBRIDEMENT WOUND; VAC PLACEMENT;  Surgeon: DMarla Roe  Loel Lofty, DO;  Location: [facility];  Service: Plastics;  Laterality: Right;  . TONSILLECTOMY     Family History  Problem Relation Age of Onset  . Dementia Mother   . Aortic stenosis Mother   . Osteoporosis Mother   . Dementia Father   . Diabetes Father   . Hyperlipidemia Father   . Hypertension Father   . Aneurysm Sister   . Hyperlipidemia Sister   . Hypertension Sister   . Diabetes Sister   . Breast cancer Neg Hx    Social History   Socioeconomic History  . Marital status: Married    Spouse name: [name]  . Number of children: 0  . Years of education: Not on file  .  Highest education level: Bachelor's degree (e.g., BA, AB, BS)  Occupational History  . Occupation: Retired  Scientific laboratory technician  . Financial resource strain: Not hard at all  . Food insecurity    Worry: Never true    Inability: Never true  . Transportation needs    Medical: No    Non-medical: No  Tobacco Use  . Smoking status: Former Smoker    Packs/day: 0.50    Years: 25.00    Pack years: 12.50    Types: Cigarettes    Start date: 09/05/1972    Quit date: 08/03/1998    Years since quitting: 20.9  . Smokeless tobacco: Never Used  . Tobacco comment: smoking cessation materials not required  Substance and Sexual Activity  . Alcohol use: No    Alcohol/week: 0.0 standard drinks  . Drug use: No  . Sexual activity: Not Currently    Birth control/protection: Post-menopausal  Lifestyle  . Physical activity    Days per week: 2 days    Minutes per session: 20 min  . Stress: Only a little  Relationships  . Social Herbalist on phone: Patient refused    Gets together: Patient refused    Attends religious service: Patient refused    Active member of club or organization: Patient refused    Attends meetings of clubs or organizations: Patient refused    Relationship status: Married  Other Topics Concern  . Not on file  Social History Narrative  . Not on file    Outpatient Encounter Medications as of 07/02/2019  Medication Sig  . albuterol (VENTOLIN HFA) 108 (90 Base) MCG/ACT inhaler Inhale 2 puffs into the lungs every 6 (six) hours as needed for wheezing or shortness of breath.  . cholecalciferol (VITAMIN D) 1000 units tablet Take 1,000 Units by mouth 2 (two) times daily.  . Coenzyme Q10 (COQ10) 400 MG CAPS Take 400 mg by mouth 2 (two) times daily.   . diazepam (VALIUM) 5 MG tablet Take 1 tablet (5 mg total) by mouth daily as needed for anxiety. 30 minutes before doctor's visit  . Digestive Enzyme CAPS Take 1 capsule by mouth 3 (three) times daily after meals.   Marland Kitchen ELDERBERRY PO  Take by mouth. Every Winter  . fluticasone furoate-vilanterol (BREO ELLIPTA) 200-25 MCG/INH AEPB Inhale 1 puff into the lungs daily.  . Ginkgo Biloba Extract (GNP GINGKO BILOBA EXTRACT) 60 MG CAPS Take 60 mg by mouth as needed.   . hydrochlorothiazide (HYDRODIURIL) 12.5 MG tablet Take 1 tablet (12.5 mg total) by mouth daily.  . magnesium 30 MG tablet Take 60 mg by mouth daily.   . Multiple Vitamin (MULTIVITAMIN) capsule Take 2 capsules by mouth daily.   Marland Kitchen Spirulina 500 MG TABS Take 500 mg  by mouth daily.   Marland Kitchen UNABLE TO FIND Med Name: Alinda Dooms  Only with radiation  . acidophilus (RISAQUAD) CAPS capsule Take 3 capsules by mouth at bedtime.  Marland Kitchen OVER THE COUNTER MEDICATION Take 1 tablet by mouth 2 (two) times daily. Nutri-Calm  . UNABLE TO FIND Med Name: liver care   No facility-administered encounter medications on file as of 07/02/2019.     Activities of Daily Living In your present state of health, do you have any difficulty performing the following activities: 07/02/2019 03/05/2019  Hearing? N N  Comment pt declines hearing aids -  Vision? N N  Difficulty concentrating or making decisions? N N  Walking or climbing stairs? N N  Dressing or bathing? N N  Doing errands, shopping? N N  Preparing Food and eating ? N -  Using the Toilet? N -  In the past six months, have you accidently leaked urine? N -  Do you have problems with loss of bowel control? N -  Managing your Medications? N -  Managing your Finances? N -  Housekeeping or managing your Housekeeping? N -  Some recent data might be hidden    Patient Care Team: [name], MD as PCP - General (Family Medicine) Bary Castilla, Forest Gleason, MD (General Surgery) Lloyd Huger, MD as Consulting Physician (Oncology) Dillingham, Loel Lofty, DO as Attending Physician (Plastic Surgery) Cathi Roan, [facility] (Pharmacist) Benedetto Goad, RN as Case Manager    Assessment:   This is a routine wellness examination for Patricia Galloway.   Exercise Activities and Dietary recommendations Current Exercise Habits: Home exercise routine, Type of exercise: walking, Time (Minutes): 20, Frequency (Times/Week): 2, Weekly Exercise (Minutes/Week): 40, Intensity: Mild, Exercise limited by: None identified  Goals    . "I am having a hard time paying for my medications because property tax is due" (pt-stated)     Nurse Case Manager Clinical Goal(s): Over the next 30 days patient will report completing application for property tax reduction  Interventions:  . Provided patient with application for [place] property tax reduction as patient qualifies related to age and income.         Marland Kitchen DIET - INCREASE WATER INTAKE     Recommend to drink at least 6-8 8oz glasses of water per day.    . I need to get my breathing improved (pt-stated)     Nurse Case Manager Clinical Goal(s): Over the next 30 days patient will report implementing stratagies discussed today to improve breathing  Interventions:  . Basic COPD/Asthma education provided, discussed strategies to prevent bronchospasms . Reviewed pursed lip breathing . Provided patient with smoking cessation information including Quit Smart Classes    Patient Self Care Activities: ex. Takes medications (independent), administers insulin (in progress), checks cbg daily (independent) . Patient will take all medications as prescribed . Patient will continue to exercise weekly to increase stamina and strength and decrease shortness of breath . Patient will utilize pursed lip breathing for shortness of breath  Please see past updates related to this goal by clicking on the "Past Updates" button in the selected goal      . I want to keep exercising at [employer] (pt-stated)     Nurse Case Manager Clinical Goal(s): Over the next 30 days, patient will reengage with [employer]  Interventions:  . Active listening, Provided emotional support and reassurance . Discussed effects of exercise on mental and  physical wellbeing   Patient Self Care Activities: ex. Takes  medications (independent), administers insulin (in progress), checks cbg daily (independent) . Patient will exercise independently at [employer]   Please see past updates related to this goal by clicking on the "Past Updates" button in the selected goal      . I want to know that I have clean air in my home (pt-stated)     Nurse Case Manager Clinical Goal(s): Over the next 30 days patient will verbalize utilizing resource for mold detection  Interventions:  . Provided patient with resource for mold detection   Headquarters: [place], [place],  [address] Serving all of [place] and [place] Phone: [phone] Business Hours: 9:00am-5:00pm M-F          Fall Risk Fall Risk  07/02/2019 03/05/2019 11/16/2018 08/16/2018 12/26/2017  Falls in the past year? 0 0 0 0 Yes  Comment - - - - fell on ice  Number falls in past yr: 0 0 0 - 1  Injury with Fall? 0 0 0 - No  Follow up Falls prevention discussed - - - Falls evaluation completed;Education provided;Falls prevention discussed   FALL RISK PREVENTION PERTAINING TO THE HOME:  Any stairs in or around the home? Yes  If so, do they handrails? Yes   Home free of loose throw rugs in walkways, pet beds, electrical cords, etc? Yes  Adequate lighting in your home to reduce risk of falls? Yes   ASSISTIVE DEVICES UTILIZED TO PREVENT FALLS:  Life alert? No  Use of a cane, walker or w/c? No  Grab bars in the bathroom? Yes  Shower chair or bench in shower? Yes Elevated toilet seat or a handicapped toilet? Yes   DME ORDERS:  DME order needed?  No   TIMED UP AND GO:  Was the test performed? Yes .  Length of time to ambulate 10 feet: 5 sec.   GAIT:  Appearance of gait: Gait stead-fast and without the use of an assistive device.   Education: Fall risk prevention has been discussed.  Intervention(s) required? No    Depression Screen PHQ 2/9  Scores 07/02/2019 03/05/2019 11/16/2018 08/16/2018  PHQ - 2 Score 0 0 0 0  PHQ- 9 Score - 2 2 1      Cognitive Function - 6CIT deferred for 2020 AWV      6CIT Screen 12/26/2017  What Year? 0 points  What month? 0 points  What time? 0 points  Count back from 20 0 points  Months in reverse 0 points  Repeat phrase 0 points  Total Score 0    Immunization History  Administered Date(s) Administered  . Tdap 08/08/2016    Qualifies for Shingles Vaccine? Yes . Due for Shingrix. Education has been provided regarding the importance of this vaccine. Pt has been advised to call insurance company to determine out of pocket expense. Advised may also receive vaccine at local pharmacy or Health Dept. Verbalized acceptance and understanding.  Tdap: Up to date  Flu Vaccine: Due for Flu vaccine. Does the patient want to receive this vaccine today?  No . Education has been provided regarding the importance of this vaccine but still declined. Advised may receive this vaccine at local pharmacy or Health Dept. Aware to provide a copy of the vaccination record if obtained from local pharmacy or Health Dept. Verbalized acceptance and understanding.  Pneumococcal Vaccine: Due for Pneumococcal vaccine. Does the patient want to receive this vaccine today?  No . Education has been provided regarding the importance of this vaccine  but still declined. Advised may receive this vaccine at local pharmacy or Health Dept. Aware to provide a copy of the vaccination record if obtained from local pharmacy or Health Dept. Verbalized acceptance and understanding.   Screening Tests Health Maintenance  Topic Date Due  . COLONOSCOPY  07/27/2001  . PNA vac Low Risk Adult (1 of 2 - PCV13) 03/04/2020 (Originally 07/27/2016)  . MAMMOGRAM  08/03/2019  . TETANUS/TDAP  08/08/2026  . DEXA SCAN  Completed  . Hepatitis C Screening  Completed  . INFLUENZA VACCINE  Discontinued    Cancer Screenings:  Colorectal Screening: Not  completed. Pt declines due to scar tissue at sigmoid colon from previous colostomy. Pt does have cologuard kit at home to complete.   Mammogram: Completed 08/02/17. Repeat every year. Ordered today. Pt provided with contact information and advised to call to schedule appt.   Bone Density: Completed 08/02/17. Results reflect  OSTEOPOROSIS. Repeat every 2 years. Ordered today. Pt provided with contact information and advised to call to schedule appt.   Lung Cancer Screening: (Low Dose CT Chest recommended if Age 31-80 years, 30 pack-year currently smoking OR have quit w/in 15years.) does not qualify.   Additional Screening:  Hepatitis C Screening: does qualify; Completed 12/26/17  Vision Screening: Recommended annual ophthalmology exams for early detection of glaucoma and other disorders of the eye. Is the patient up to date with their annual eye exam?  No  Who is the provider or what is the name of the office in which the pt attends annual eye exams? Not established, plans to establish at [facility].   Dental Screening: Recommended annual dental exams for proper oral hygiene  Community Resource Referral:  CRR required this visit?  No      Plan:     I have personally reviewed and addressed the Medicare Annual Wellness questionnaire and have noted the following in the patient's chart:  A. Medical and social history B. Use of alcohol, tobacco or illicit drugs  C. Current medications and supplements D. Functional ability and status E.  Nutritional status F.  Physical activity G. Advance directives H. List of other physicians I.  Hospitalizations, surgeries, and ER visits in previous 12 months J.  [place] such as hearing and vision if needed, cognitive and depression L. Referrals and appointments   In addition, I have reviewed and discussed with patient certain preventive protocols, quality metrics, and best practice recommendations. A written personalized care  plan for preventive services as well as general preventive health recommendations were provided to patient.   Signed,  [name], LPN Nurse Health Advisor   Nurse Notes: pt c/o tingling sensation in left wrist/hand and plans to see a chiropractor to help address this issue along with adjustments for asthma and osteoporosis. Pt has seen Dr. Freddi Che in the past and plans to seek care with him again. Pt also c/o HCTZ dries her out and makes her feel fatigued. She states she has a stress response to blood pressure cuff. BP elevated today at 152/92. Pt denies headaches, blurred vision or chest pain. Pt advised to contact our office if sxs worsen or persist. Pt has declined all blood pressure medication.

## 2019-07-02 NOTE — Patient Instructions (Signed)
[name] , Thank you for taking time to come for your Medicare Wellness Visit. I appreciate your ongoing commitment to your health goals. Please review the following plan we discussed and let me know if I can assist you in the future.   Screening recommendations/referrals: Colonoscopy: Please complete Cologuard kit for colon cancer screening.  Mammogram: done 08/02/17. Please call [phone] to schedule your mammogram and bone density screening.   Bone Density: done 08/02/17. Recommended yearly ophthalmology/optometry visit for glaucoma screening and checkup Recommended yearly dental visit for hygiene and checkup  Vaccinations: Influenza vaccine: postponed Pneumococcal vaccine: postponed Tdap vaccine: done 08/08/16 Shingles vaccine: Shingrix discussed. Postponed.  Conditions/risks identified: Recommend increasing physical activity as tolerated.   Next appointment: Please follow up in one year for your Medicare Annual Wellness visit.     Preventive Care 65 Years and Older, Female Preventive care refers to lifestyle choices and visits with your health care provider that can promote health and wellness. What does preventive care include?  A yearly physical exam. This is also called an annual well check.  Dental exams once or twice a year.  Routine eye exams. Ask your health care provider how often you should have your eyes checked.  Personal lifestyle choices, including:  Daily care of your teeth and gums.  Regular physical activity.  Eating a healthy diet.  Avoiding tobacco and drug use.  Limiting alcohol use.  Practicing safe sex.  Taking low-dose aspirin every day.  Taking vitamin and mineral supplements as recommended by your health care provider. What happens during an annual well check? The services and screenings done by your health care provider during your annual well check will depend on your age, overall health, lifestyle risk factors, and family history of  disease. Counseling  Your health care provider may ask you questions about your:  Alcohol use.  Tobacco use.  Drug use.  Emotional well-being.  Home and relationship well-being.  Sexual activity.  Eating habits.  History of falls.  Memory and ability to understand (cognition).  Work and work Statistician.  Reproductive health. Screening  You may have the following tests or measurements:  Height, weight, and BMI.  Blood pressure.  Lipid and cholesterol levels. These may be checked every 5 years, or more frequently if you are over 76 years old.  Skin check.  Lung cancer screening. You may have this screening every year starting at age 58 if you have a 30-pack-year history of smoking and currently smoke or have quit within the past 15 years.  Fecal occult blood test (FOBT) of the stool. You may have this test every year starting at age 32.  Flexible sigmoidoscopy or colonoscopy. You may have a sigmoidoscopy every 5 years or a colonoscopy every 10 years starting at age 71.  Hepatitis C blood test.  Hepatitis B blood test.  Sexually transmitted disease (STD) testing.  Diabetes screening. This is done by checking your blood sugar (glucose) after you have not eaten for a while (fasting). You may have this done every 1-3 years.  Bone density scan. This is done to screen for osteoporosis. You may have this done starting at age 45.  Mammogram. This may be done every 1-2 years. Talk to your health care provider about how often you should have regular mammograms. Talk with your health care provider about your test results, treatment options, and if necessary, the need for more tests. Vaccines  Your health care provider may recommend certain vaccines, such as:  Influenza vaccine. This  is recommended every year.  Tetanus, diphtheria, and acellular pertussis (Tdap, Td) vaccine. You may need a Td booster every 10 years.  Zoster vaccine. You may need this after age 46.   Pneumococcal 13-valent conjugate (PCV13) vaccine. One dose is recommended after age 62.  Pneumococcal polysaccharide (PPSV23) vaccine. One dose is recommended after age 17. Talk to your health care provider about which screenings and vaccines you need and how often you need them. This information is not intended to replace advice given to you by your health care provider. Make sure you discuss any questions you have with your health care provider. Document Released: 09/18/2015 Document Revised: 05/11/2016 Document Reviewed: 06/23/2015 Elsevier Interactive Patient Education  2017 [place] [address] Prevention in the Home Falls can cause injuries. They can happen to people of all ages. There are many things you can do to make your home safe and to help prevent falls. What can I do on the outside of my home?  Regularly fix the edges of walkways and driveways and fix any cracks.  Remove anything that might make you trip as you walk through a door, such as a raised step or threshold.  Trim any bushes or trees on the path to your home.  Use bright outdoor lighting.  Clear any walking paths of anything that might make someone trip, such as rocks or tools.  Regularly check to see if handrails are loose or broken. Make sure that both sides of any steps have handrails.  Any raised decks and porches should have guardrails on the edges.  Have any leaves, snow, or ice cleared regularly.  Use sand or salt on walking paths during winter.  Clean up any spills in your garage right away. This includes oil or grease spills. What can I do in the bathroom?  Use night lights.  Install grab bars by the toilet and in the tub and shower. Do not use towel bars as grab bars.  Use non-skid mats or decals in the tub or shower.  If you need to sit down in the shower, use a plastic, non-slip stool.  Keep the floor dry. Clean up any water that spills on the floor as soon as it happens.  Remove soap  buildup in the tub or shower regularly.  Attach bath mats securely with double-sided non-slip rug tape.  Do not have throw rugs and other things on the floor that can make you trip. What can I do in the bedroom?  Use night lights.  Make sure that you have a light by your bed that is easy to reach.  Do not use any sheets or blankets that are too big for your bed. They should not hang down onto the floor.  Have a firm chair that has side arms. You can use this for support while you get dressed.  Do not have throw rugs and other things on the floor that can make you trip. What can I do in the kitchen?  Clean up any spills right away.  Avoid walking on wet floors.  Keep items that you use a lot in easy-to-reach places.  If you need to reach something above you, use a strong step stool that has a grab bar.  Keep electrical cords out of the way.  Do not use floor polish or wax that makes floors slippery. If you must use wax, use non-skid floor wax.  Do not have throw rugs and other things on the floor that can make you  trip. What can I do with my stairs?  Do not leave any items on the stairs.  Make sure that there are handrails on both sides of the stairs and use them. Fix handrails that are broken or loose. Make sure that handrails are as long as the stairways.  Check any carpeting to make sure that it is firmly attached to the stairs. Fix any carpet that is loose or worn.  Avoid having throw rugs at the top or bottom of the stairs. If you do have throw rugs, attach them to the floor with carpet tape.  Make sure that you have a light switch at the top of the stairs and the bottom of the stairs. If you do not have them, ask someone to add them for you. What else can I do to help prevent falls?  Wear shoes that:  Do not have high heels.  Have rubber bottoms.  Are comfortable and fit you well.  Are closed at the toe. Do not wear sandals.  If you use a stepladder:  Make  sure that it is fully opened. Do not climb a closed stepladder.  Make sure that both sides of the stepladder are locked into place.  Ask someone to hold it for you, if possible.  Clearly mark and make sure that you can see:  Any grab bars or handrails.  First and last steps.  Where the edge of each step is.  Use tools that help you move around (mobility aids) if they are needed. These include:  Canes.  Walkers.  Scooters.  Crutches.  Turn on the lights when you go into a dark area. Replace any light bulbs as soon as they burn out.  Set up your furniture so you have a clear path. Avoid moving your furniture around.  If any of your floors are uneven, fix them.  If there are any pets around you, be aware of where they are.  Review your medicines with your doctor. Some medicines can make you feel dizzy. This can increase your chance of falling. Ask your doctor what other things that you can do to help prevent falls. This information is not intended to replace advice given to you by your health care provider. Make sure you discuss any questions you have with your health care provider. Document Released: 06/18/2009 Document Revised: 01/28/2016 Document Reviewed: 09/26/2014 Elsevier Interactive Patient Education  2017 [employer].

## 2019-08-23 ENCOUNTER — Other Ambulatory Visit: Payer: Self-pay | Admitting: Family Medicine

## 2019-08-23 DIAGNOSIS — J449 Chronic obstructive pulmonary disease, unspecified: Secondary | ICD-10-CM

## 2019-08-23 DIAGNOSIS — J4540 Moderate persistent asthma, uncomplicated: Secondary | ICD-10-CM

## 2019-08-24 ENCOUNTER — Other Ambulatory Visit: Payer: Self-pay | Admitting: Family Medicine

## 2019-08-24 DIAGNOSIS — I10 Essential (primary) hypertension: Secondary | ICD-10-CM

## 2019-09-19 ENCOUNTER — Other Ambulatory Visit: Payer: Medicare HMO

## 2019-09-20 NOTE — Progress Notes (Deleted)
[place]  Telephone:([phone] [phone]) [phone]  ID: Patricia Galloway OB: 05/23/1951  MR#: [id number]  [id number]  Patient Care Team: [name], MD as PCP - General (Family Medicine) Bary Castilla, Forest Gleason, MD (General Surgery) Lloyd Huger, MD as Consulting Physician (Oncology) Dillingham, Loel Lofty, DO as Attending Physician (Plastic Surgery) Cathi Roan, [facility] (Pharmacist) Benedetto Goad, RN as Case Manager  I connected with Patricia Galloway on 09/20/19 at  2:30 PM EST by {Blank single:19197::"video enabled telemedicine visit","telephone visit"} and verified that I am speaking with the correct person using two identifiers.   I discussed the limitations, risks, security and privacy concerns of performing an evaluation and management service by telemedicine and the availability of in-person appointments. I also discussed with the patient that there may be a patient responsible charge related to this service. The patient expressed understanding and agreed to proceed.   Other persons participating in the visit and their role in the encounter: Patient, MD.  Patient's location: Home. Provider's location: Clinic.  CHIEF COMPLAINT: Iron deficiency anemia, locally advanced basal cell carcinoma of the right leg, pulmonary nodules..  INTERVAL HISTORY: Patient returns to clinic today for routine 13-month evaluation and discussion of her imaging results.  She continues to feel well and remains asymptomatic. She has no neurologic complaints. She denies any recent fevers or illnesses.  She has a good appetite and denies weight loss.  She denies any chest pain, shortness of breath, cough, or hemoptysis.  She denies any nausea, vomiting, constipation, or diarrhea.  She has no melena or hematochezia.  She has no urinary complaints.  Patient feels at her baseline offers no specific complaints today.    REVIEW OF SYSTEMS:   Review of Systems  Constitutional:  Negative for fever, malaise/fatigue and weight loss.  Respiratory: Negative.  Negative for cough and shortness of breath.   Cardiovascular: Negative.  Negative for chest pain and leg swelling.  Gastrointestinal: Negative.  Negative for abdominal pain, nausea and vomiting.  Genitourinary: Negative.  Negative for dysuria.  Musculoskeletal: Negative.  Negative for myalgias.  Skin: Negative.  Negative for rash.  Neurological: Negative.  Negative for sensory change, focal weakness and weakness.  Psychiatric/Behavioral: Negative.  The patient is not nervous/anxious.     As per HPI. Otherwise, a complete review of systems is negative.  PAST MEDICAL HISTORY: Past Medical History:  Diagnosis Date  . Anemia    has had iron infusion  . Anxiety    complicated grief  . Aortic aneurysm ([place])    found on CT in December, 2017  . Asthma    as child , but no episodes in over 30 years  . Basal cell carcinoma   . Cancer ([place]) 08/2016   basal cell carcinoma - right leg  . History of transfusion of packed red blood cells     PAST SURGICAL HISTORY: Past Surgical History:  Procedure Laterality Date  . BASAL CELL CARCINOMA EXCISION Right 03/29/2017   Procedure: EXCISION OF RIGHT LEG BASEL CELL CARCINOMA;  Surgeon: [name], DO;  Location: [facility];  Service: Plastics;  Laterality: Right;  . COLON SURGERY  2002   colostomy for 10 months after a perfurated sigmoid   . COLOSTOMY REVERSAL  2003  . EMBOLIZATION Right 10/12/2016   Procedure: Embolization;  Surgeon: [name], MD;  Location: [facility];  Service: Cardiovascular;  Laterality: Right;  . etopic  [id number]  . INCISION AND DRAINAGE OF WOUND Right 03/30/2017  Procedure: IRRIGATION AND DEBRIDEMENT WOUND; VAC PLACEMENT;  Surgeon: [name], DO;  Location: [facility];  Service: Plastics;  Laterality: Right;  . TONSILLECTOMY      FAMILY HISTORY: Family History  Problem Relation Age of Onset  . Dementia Mother     . Aortic stenosis Mother   . Osteoporosis Mother   . Dementia Father   . Diabetes Father   . Hyperlipidemia Father   . Hypertension Father   . Aneurysm Sister   . Hyperlipidemia Sister   . Hypertension Sister   . Diabetes Sister   . Breast cancer Neg Hx     ADVANCED DIRECTIVES (Y/N):  N  HEALTH MAINTENANCE: Social History   Tobacco Use  . Smoking status: Former Smoker    Packs/day: 0.50    Years: 25.00    Pack years: 12.50    Types: Cigarettes    Start date: 09/05/1972    Quit date: 08/03/1998    Years since quitting: 21.1  . Smokeless tobacco: Never Used  . Tobacco comment: smoking cessation materials not required  Substance Use Topics  . Alcohol use: No    Alcohol/week: 0.0 standard drinks  . Drug use: No     Colonoscopy:  PAP:  Bone density:  Lipid panel:  Allergies  Allergen Reactions  . Penicillins Anaphylaxis    Has patient had a PCN reaction causing immediate rash, facial/tongue/throat swelling, SOB or lightheadedness with hypotension: Yes Has patient had a PCN reaction causing severe rash involving mucus membranes or skin necrosis: No Has patient had a PCN reaction that required hospitalization: No Has patient had a PCN reaction occurring within the last 10 years: No If all of the above answers are "NO", then may proceed with Cephalosporin use.   . Codeine Nausea And Vomiting  . Latex Itching  . Sulfa Antibiotics Other (See Comments)    TOPICAL-SULFA CAUSED BLISTERS  . Tape Other (See Comments)    Reaction:  Blisters and burning  Pt states that paper tape is okay.    [address] Hcl] Anxiety    Current Outpatient Medications  Medication Sig Dispense Refill  . acidophilus (RISAQUAD) CAPS capsule Take 3 capsules by mouth at bedtime.    Marland Kitchen albuterol (VENTOLIN HFA) 108 (90 Base) MCG/ACT inhaler Inhale 2 puffs into the lungs every 6 (six) hours as needed for wheezing or shortness of breath. 18 g 0  . BREO ELLIPTA 200-25 MCG/INH AEPB TAKE 1  PUFF BY MOUTH EVERY DAY 60 each 5  . cholecalciferol (VITAMIN D) 1000 units tablet Take 1,000 Units by mouth 2 (two) times daily.    . Coenzyme Q10 (COQ10) 400 MG CAPS Take 400 mg by mouth 2 (two) times daily.     . diazepam (VALIUM) 5 MG tablet Take 1 tablet (5 mg total) by mouth daily as needed for anxiety. 30 minutes before doctor's visit 30 tablet 0  . Digestive Enzyme CAPS Take 1 capsule by mouth 3 (three) times daily after meals.     Marland Kitchen ELDERBERRY PO Take by mouth. Every Winter    . Ginkgo Biloba Extract (GNP GINGKO BILOBA EXTRACT) 60 MG CAPS Take 60 mg by mouth as needed.     . hydrochlorothiazide (HYDRODIURIL) 12.5 MG tablet TAKE 1 TABLET BY MOUTH EVERY DAY 90 tablet 0  . magnesium 30 MG tablet Take 60 mg by mouth daily.     . Multiple Vitamin (MULTIVITAMIN) capsule Take 2 capsules by mouth daily.     Marland Kitchen OVER THE COUNTER  MEDICATION Take 1 tablet by mouth 2 (two) times daily. Nutri-Calm    . Spirulina 500 MG TABS Take 500 mg by mouth daily.     Marland Kitchen UNABLE TO FIND Med Name: Alinda Dooms  Only with radiation    . UNABLE TO FIND Med Name: liver care     No current facility-administered medications for this visit.    OBJECTIVE: There were no vitals filed for this visit.   There is no height or weight on file to calculate BMI.    ECOG FS:0 - Asymptomatic  General: Well-developed, well-nourished, no acute distress. Eyes: Pink conjunctiva, anicteric sclera. HEENT: Normocephalic, moist mucous membranes. Lungs: Clear to auscultation bilaterally. Heart: Regular rate and rhythm. No rubs, murmurs, or gallops. Abdomen: Soft, nontender, nondistended. No organomegaly noted, normoactive bowel sounds. Musculoskeletal: No edema, cyanosis, or clubbing.  Well-healed surgical scar on right leg with significant deformity. Neuro: Alert, answering all questions appropriately. Cranial nerves grossly intact. Skin: No rashes or petechiae noted. Psych: Normal affect.  LAB RESULTS:  Lab Results  Component  Value Date   NA 136 02/12/2019   K 4.3 02/12/2019   CL 101 02/12/2019   CO2 24 02/12/2019   GLUCOSE 96 02/12/2019   BUN 18 02/12/2019   CREATININE 0.80 06/17/2019   CALCIUM 9.6 02/12/2019   PROT 7.6 02/11/2019   ALBUMIN 4.4 02/11/2019   AST 19 02/11/2019   ALT 16 02/11/2019   ALKPHOS 62 02/11/2019   BILITOT 0.8 02/11/2019   GFRNONAA 82 02/12/2019   GFRAA 96 02/12/2019    Lab Results  Component Value Date   WBC 8.8 02/11/2019   NEUTROABS 5.5 02/11/2019   HGB 14.7 02/11/2019   HCT 43.3 02/11/2019   MCV 87.8 02/11/2019   PLT 269 02/11/2019   Lab Results  Component Value Date   IRON 71 01/09/2017   TIBC 225 (L) 01/09/2017   IRONPCTSAT 32 (H) 01/09/2017   Lab Results  Component Value Date   FERRITIN 58 01/09/2017    STUDIES: No results found.  ASSESSMENT: Iron deficiency anemia, locally advanced basal cell carcinoma of the right leg.  PLAN:    1. Iron deficiency anemia: Resolved.  Patient last received Feraheme on Jan 28, 2017.     2. Basal cell carcinoma of the right leg: Resolved without evidence of recurrence.  Patient stated this lesion grew for 9 years and she did not seek medical attention. Diagnosis confirmed by biopsy from surgery.  Patient underwent embolization as well as to neoadjuvant treatment with vismodegib (Erivedge)150 mg daily, a hedgehog inhibitor. Patient underwent complete resection of residual malignancy on March 29, 2017.  Previously, adjuvant chemotherapy and XRT was discussed, but given widely clear margins on surgical resections this was determined not to be necessary.  No further interventions are needed at this time.   3. Hypertension: Chronic and unchanged.  Patient's blood pressure remains significantly elevated.  Continue monitoring and treatment per primary care. 4.  Pulmonary nodules: CT scan results from June 17, 2019 reviewed independently and reported as above with previously noted pulmonary nodules to be stable, but patient had  interval development of a new 12 mm nodule in the left upper lobe.  Case was discussed at tumor board and recommendation was to pursue PET scan followed by likely navigational bronchoscopy with pulmonary.  Patient will have video assisted telemedicine visit 1 to 2 days after her PET scan to discuss the results.    I provided *** minutes of {Blank single:19197::"face-to-face video visit time","non face-to-face telephone  visit time"} during this encounter which included chart review. Greater than 50% was spent counseling as documented under my assessment & plan.   Patient expressed understanding and was in agreement with this plan. She also understands that She can call clinic at any time with any questions, concerns, or complaints.   Cancer Staging Basal cell carcinoma, leg, right Staging form: Melanoma of the Skin, AJCC 7th Edition - Clinical: No stage assigned - Unsigned   Lloyd Huger, MD   09/20/2019 6:40 AM

## 2019-09-24 ENCOUNTER — Inpatient Hospital Stay: Payer: Medicare HMO | Admitting: Oncology

## 2019-10-21 ENCOUNTER — Ambulatory Visit
Admission: RE | Admit: 2019-10-21 | Discharge: 2019-10-21 | Disposition: A | Discharge status: Home / Self Care | Payer: Medicare HMO | Source: Ambulatory Visit | Attending: Family Medicine | Admitting: Family Medicine

## 2019-10-21 DIAGNOSIS — Z780 Asymptomatic menopausal state: Secondary | ICD-10-CM | POA: Diagnosis not present

## 2019-10-21 DIAGNOSIS — Z1231 Encounter for screening mammogram for malignant neoplasm of breast: Secondary | ICD-10-CM

## 2019-10-21 DIAGNOSIS — M810 Age-related osteoporosis without current pathological fracture: Secondary | ICD-10-CM | POA: Diagnosis not present

## 2019-11-02 NOTE — Progress Notes (Signed)
[place]  [phone] [phone]  ID: Patricia Galloway OB: 1951-08-14  MR#: [id number]  [id number]  Patient Care Team: [name], MD as PCP - General (Family Medicine) Bary Castilla, Forest Gleason, MD (General Surgery) Lloyd Huger, MD as Consulting Physician (Oncology) Dillingham, Loel Lofty, DO as Attending Physician (Plastic Surgery) Cathi Roan, [facility] (Pharmacist)  CHIEF COMPLAINT: Iron deficiency anemia, locally advanced basal cell carcinoma of the right leg, pulmonary nodules..  INTERVAL HISTORY: Patient returns to clinic today for further evaluation and discussion of her imaging results.  She continues to feel well remains asymptomatic.  She has no neurologic complaints. She denies any recent fevers or illnesses.  She has a good appetite and denies weight loss.  She denies any chest pain, shortness of breath, cough, or hemoptysis.  She denies any nausea, vomiting, constipation, or diarrhea.  She has no melena or hematochezia.  She has no urinary complaints.  Patient offers no specific complaints today.  REVIEW OF SYSTEMS:   Review of Systems  Constitutional: Negative for fever, malaise/fatigue and weight loss.  Respiratory: Negative.  Negative for cough and shortness of breath.   Cardiovascular: Negative.  Negative for chest pain and leg swelling.  Gastrointestinal: Negative.  Negative for abdominal pain, nausea and vomiting.  Genitourinary: Negative.  Negative for dysuria.  Musculoskeletal: Negative.  Negative for myalgias.  Skin: Negative.  Negative for rash.  Neurological: Negative.  Negative for sensory change, focal weakness and weakness.  Psychiatric/Behavioral: Negative.  The patient is not nervous/anxious.     As per HPI. Otherwise, a complete review of systems is negative.  PAST MEDICAL HISTORY: Past Medical History:  Diagnosis Date  . Anemia    has had iron infusion  . Anxiety    complicated grief  . Aortic  aneurysm ([place])    found on CT in December, 2017  . Asthma    as child , but no episodes in over 30 years  . Basal cell carcinoma   . Cancer ([place]) 08/2016   basal cell carcinoma - right leg  . History of transfusion of packed red blood cells   . Personal history of chemotherapy 2018   basal cell     PAST SURGICAL HISTORY: Past Surgical History:  Procedure Laterality Date  . BASAL CELL CARCINOMA EXCISION Right 03/29/2017   Procedure: EXCISION OF RIGHT LEG BASEL CELL CARCINOMA;  Surgeon: [name], DO;  Location: [facility];  Service: Plastics;  Laterality: Right;  . COLON SURGERY  2002   colostomy for 10 months after a perfurated sigmoid   . COLOSTOMY REVERSAL  2003  . EMBOLIZATION Right 10/12/2016   Procedure: Embolization;  Surgeon: [name], MD;  Location: [facility] [facility];  Service: Cardiovascular;  Laterality: Right;  . etopic  [id number]  . INCISION AND DRAINAGE OF WOUND Right 03/30/2017   Procedure: IRRIGATION AND DEBRIDEMENT WOUND; VAC PLACEMENT;  Surgeon: [name], DO;  Location: [facility];  Service: Plastics;  Laterality: Right;  . TONSILLECTOMY      FAMILY HISTORY: Family History  Problem Relation Age of Onset  . Dementia Mother   . Aortic stenosis Mother   . Osteoporosis Mother   . Dementia Father   . Diabetes Father   . Hyperlipidemia Father   . Hypertension Father   . Aneurysm Sister   . Hyperlipidemia Sister   . Hypertension Sister   . Diabetes Sister   . Breast cancer Neg Hx     ADVANCED DIRECTIVES (Y/N):  N  HEALTH MAINTENANCE: Social History   Tobacco Use  . Smoking status: Former Smoker    Packs/day: 0.50    Years: 25.00    Pack years: 12.50    Types: Cigarettes    Start date: 09/05/1972    Quit date: 08/03/1998    Years since quitting: 21.2  . Smokeless tobacco: Never Used  . Tobacco comment: smoking cessation materials not required  Substance Use Topics  . Alcohol use: No    Alcohol/week: 0.0 standard drinks  .  Drug use: No     Colonoscopy:  PAP:  Bone density:  Lipid panel:  Allergies  Allergen Reactions  . Penicillins Anaphylaxis    Has patient had a PCN reaction causing immediate rash, facial/tongue/throat swelling, SOB or lightheadedness with hypotension: Yes Has patient had a PCN reaction causing severe rash involving mucus membranes or skin necrosis: No Has patient had a PCN reaction that required hospitalization: No Has patient had a PCN reaction occurring within the last 10 years: No If all of the above answers are "NO", then may proceed with Cephalosporin use.   . Codeine Nausea And Vomiting  . Latex Itching  . Sulfa Antibiotics Other (See Comments)    TOPICAL-SULFA CAUSED BLISTERS  . Tape Other (See Comments)    Reaction:  Blisters and burning  Pt states that paper tape is okay.    [address] Hcl] Anxiety    Current Outpatient Medications  Medication Sig Dispense Refill  . acidophilus (RISAQUAD) CAPS capsule Take 3 capsules by mouth at bedtime.    Marland Kitchen albuterol (VENTOLIN HFA) 108 (90 Base) MCG/ACT inhaler Inhale 2 puffs into the lungs every 6 (six) hours as needed for wheezing or shortness of breath. 18 g 0  . BREO ELLIPTA 200-25 MCG/INH AEPB TAKE 1 PUFF BY MOUTH EVERY DAY 60 each 5  . cholecalciferol (VITAMIN D) 1000 units tablet Take 1,000 Units by mouth 2 (two) times daily.    . Coenzyme Q10 (COQ10) 400 MG CAPS Take 400 mg by mouth 2 (two) times daily.     . diazepam (VALIUM) 5 MG tablet Take 1 tablet (5 mg total) by mouth daily as needed for anxiety. 30 minutes before doctor's visit 30 tablet 0  . Digestive Enzyme CAPS Take 1 capsule by mouth 3 (three) times daily after meals.     Marland Kitchen ELDERBERRY PO Take by mouth. Every Winter    . Ginkgo Biloba Extract (GNP GINGKO BILOBA EXTRACT) 60 MG CAPS Take 60 mg by mouth as needed.     . hydrochlorothiazide (HYDRODIURIL) 12.5 MG tablet TAKE 1 TABLET BY MOUTH EVERY DAY 90 tablet 0  . magnesium 30 MG tablet Take 60 mg by  mouth daily.     . Multiple Vitamin (MULTIVITAMIN) capsule Take 2 capsules by mouth daily.     Marland Kitchen OVER THE COUNTER MEDICATION Take 1 tablet by mouth 2 (two) times daily. Nutri-Calm    . Spirulina 500 MG TABS Take 500 mg by mouth daily.     Marland Kitchen UNABLE TO FIND Med Name: Alinda Dooms  Only with radiation    . UNABLE TO FIND Med Name: liver care     No current facility-administered medications for this visit.    OBJECTIVE: Vitals:   11/08/19 1421  BP: (!) 232/113  Pulse: (!) 108  Temp: 98.3 F (36.8 C)  SpO2: 97%     Body mass index is 21.91 kg/m.    ECOG FS:0 - Asymptomatic  General: Well-developed, well-nourished, no acute  distress. Eyes: Pink conjunctiva, anicteric sclera. HEENT: Normocephalic, moist mucous membranes. Lungs: No audible wheezing or coughing. Heart: Regular rate and rhythm. Abdomen: Soft, nontender, no obvious distention. Musculoskeletal: No edema, cyanosis, or clubbing.  Well-healed surgical scar on right leg with significant surgical deformity. Neuro: Alert, answering all questions appropriately. Cranial nerves grossly intact. Skin: No rashes or petechiae noted. Psych: Normal affect.   LAB RESULTS:  Lab Results  Component Value Date   NA 136 02/12/2019   K 4.3 02/12/2019   CL 101 02/12/2019   CO2 24 02/12/2019   GLUCOSE 96 02/12/2019   BUN 18 02/12/2019   CREATININE 0.80 06/17/2019   CALCIUM 9.6 02/12/2019   PROT 7.6 02/11/2019   ALBUMIN 4.4 02/11/2019   AST 19 02/11/2019   ALT 16 02/11/2019   ALKPHOS 62 02/11/2019   BILITOT 0.8 02/11/2019   GFRNONAA 82 02/12/2019   GFRAA 96 02/12/2019    Lab Results  Component Value Date   WBC 8.8 02/11/2019   NEUTROABS 5.5 02/11/2019   HGB 14.7 02/11/2019   HCT 43.3 02/11/2019   MCV 87.8 02/11/2019   PLT 269 02/11/2019   Lab Results  Component Value Date   IRON 71 01/09/2017   TIBC 225 (L) 01/09/2017   IRONPCTSAT 32 (H) 01/09/2017   Lab Results  Component Value Date   FERRITIN 58 01/09/2017     STUDIES: NM PET Image Initial (PI) Skull Base To Thigh  Result Date: 11/06/2019 CLINICAL DATA:  Initial treatment strategy for basal cell carcinoma the RIGHT leg. Pulmonary nodule. EXAM: NUCLEAR MEDICINE PET SKULL BASE TO THIGH TECHNIQUE: 6.6 mCi F-18 FDG was injected intravenously. Full-ring PET imaging was performed from the skull base to thigh after the radiotracer. CT data was obtained and used for attenuation correction and anatomic localization. Fasting blood glucose: 103 mg/dl COMPARISON:  None. FINDINGS: Mediastinal blood pool activity: SUV max 1.7 Liver activity: SUV max NA NECK: No hypermetabolic lymph nodes in the neck. Incidental CT findings: none CHEST: Within the LEFT upper lobe rounded nodule measures 11 mm (image 62/4) which compares to 12 mm on comparison exam. This nodule has very low metabolic activity (SUV max equal 1.3). This activity is less than background blood pool activity The second irregular nodule in the more superior LEFT upper lobe measuring 6 mm compares to 6 mm (image 55/4) and does not associated metabolic activity. Centrilobular emphysema noted in the upper lobes. Incidental CT findings: none ABDOMEN/PELVIS: No abnormal hypermetabolic activity within the liver, pancreas, adrenal glands, or spleen. No hypermetabolic lymph nodes in the abdomen or pelvis. Incidental CT findings: Chronic pancreatic calcifications. Atherosclerotic calcification of the aorta. SKELETON: No focal hypermetabolic activity to suggest skeletal metastasis. Incidental CT findings: none IMPRESSION: 1. No evidence metastatic basal cell carcinoma. 2. Two stable LEFT upper lobe nodules compared to CT 06/17/2019. Larger nodule has very low metabolic activity. Findings most consistent with benign pulmonary nodule. As nodules are new from CT 08/25/2016, recommend follow-up CT of the thorax in 6-12 months. These results will be called to the ordering clinician or representative by the Radiologist Assistant, and  communication documented in the PACS or zVision Dashboard. Electronically Signed   By: Suzy Bouchard M.D.   On: 11/06/2019 13:46   DG Bone Density  Result Date: 10/21/2019 EXAM: DUAL X-RAY ABSORPTIOMETRY (DXA) FOR BONE MINERAL DENSITY IMPRESSION: Your patient Patricia Galloway completed a BMD test on 10/21/2019 using the [place] (software version: 14.10) manufactured by [employer]. The following summarizes the  results of our evaluation. Technologist: MTB PATIENT BIOGRAPHICAL: Name: Patricia Galloway, Patricia Galloway Patient ID: [id number] Birth Date: [birth year] Height: 63.0 in. Gender: Female Exam Date: 10/21/2019 Weight: 124.8 lbs. Indications: Asthma, Caucasian, COPD, Family Hx of Osteoporosis, Parent Hip Fracture, Postmenopausal, Previous Chemo and Radiation Fractures: Treatments: Albuterol, Multi-Vitamin, Vitamin D DENSITOMETRY RESULTS: Site         Region     Measured Date Measured Age WHO Classification Young Adult T-score BMD         %Change vs. Previous Significant Change (*) AP Spine L1-L3 10/21/2019 68.2 Osteoporosis -3.9 0.701 g/cm2 -5.8% Yes AP Spine L1-L3 08/02/2017 66.0 Osteoporosis -3.6 0.744 g/cm2 - - DualFemur Neck Left 10/21/2019 68.2 Osteoporosis -3.8 0.507 g/cm2 -2.1% - DualFemur Neck Left 08/02/2017 66.0 Osteoporosis -3.7 0.518 g/cm2 - - DualFemur Total Mean 10/21/2019 68.2 Osteoporosis -3.4 0.579 g/cm2 0.9% - DualFemur Total Mean 08/02/2017 66.0 Osteoporosis -3.4 0.574 g/cm2 - - Left Forearm Radius 33% 10/21/2019 68.2 Osteoporosis -3.6 0.560 g/cm2 -6.4% Yes Left Forearm Radius 33% 08/02/2017 66.0 Osteoporosis -3.2 0.598 g/cm2 - - ASSESSMENT: The BMD measured at AP Spine L1-L3 is 0.701 g/cm2 with a T-score of -3.9. This patient is considered osteoporotic according to [facility]) criteria. The scan quality is good. L-4 was excluded due to degenerative changes. Compared with prior study, there has been significant decrease in the spine. Compared with prior study,  there has been no significant change in the total hip. [facility]) criteria for post-menopausal, Caucasian Women: Normal:       T-score at or above -1 SD Osteopenia:   T-score between -1 and -2.5 SD Osteoporosis: T-score at or below -2.5 SD RECOMMENDATIONS: 1. All patients should optimize calcium and vitamin D intake. 2. Consider FDA-approved medical therapies in postmenopausal women and men aged 63 years and older, based on the following: a. A hip or vertebral(clinical or morphometric) fracture b. T-score < -2.5 at the femoral neck or spine after appropriate evaluation to exclude secondary causes c. Low bone mass (T-score between -1.0 and -2.5 at the femoral neck or spine) and a 10-year probability of a hip fracture > 3% or a 10-year probability of a major osteoporosis-related fracture > 20% based on the [place]-adapted WHO algorithm 3. Clinician judgment and/or patient preferences may indicate treatment for people with 10-year fracture probabilities above or below these levels FOLLOW-UP: People with diagnosed cases of osteoporosis or at high risk for fracture should have regular bone mineral density tests. For patients eligible for Medicare, routine testing is allowed once every 2 years. The testing frequency can be increased to one year for patients who have rapidly progressing disease, those who are receiving or discontinuing medical therapy to restore bone mass, or have additional risk factors. I have reviewed this report, and agree with the above findings. [facility] Electronically Signed   By: Lowella Grip III M.D.   On: 10/21/2019 14:08   MM 3D SCREEN BREAST BILATERAL  Result Date: 10/21/2019 CLINICAL DATA:  Screening. EXAM: DIGITAL SCREENING BILATERAL MAMMOGRAM WITH TOMO AND CAD COMPARISON:  Previous exam(s). ACR Breast Density Category c: The breast tissue is heterogeneously dense, which may obscure small masses. FINDINGS: There are no findings suspicious for  malignancy. Images were processed with CAD. IMPRESSION: No mammographic evidence of malignancy. A result letter of this screening mammogram will be mailed directly to the patient. RECOMMENDATION: Screening mammogram in one year. (Code:SM-B-01Y) BI-RADS CATEGORY  1: Negative. Electronically Signed   By: Claudie Revering M.D.   On: 10/21/2019  17:00    ASSESSMENT: Iron deficiency anemia, locally advanced basal cell carcinoma of the right leg.  PLAN:    1. Iron deficiency anemia: Resolved.  Patient last received Feraheme on Jan 28, 2017.     2. Basal cell carcinoma of the right leg: Resolved without evidence of recurrence.  Patient stated this lesion grew for 9 years and she did not seek medical attention. Diagnosis confirmed by biopsy from surgery.  Patient underwent embolization as well as to neoadjuvant treatment with vismodegib (Erivedge)150 mg daily, a hedgehog inhibitor. Patient underwent complete resection of residual malignancy on March 29, 2017.  Previously, adjuvant chemotherapy and XRT was discussed, but given widely clear margins on surgical resections this was determined not to be necessary.  No further interventions are needed at this time.  No obvious evidence of recurrence on PET scan as above. 3. Hypertension: Chronic and unchanged.  Patient's blood pressure remains significantly elevated, but she is asymptomatic.  Continue monitoring and treatment per primary care. 4.  Pulmonary nodules: PET scan results from November 06, 2019 reviewed independently and reported as above CT scan results from June 17, 2019 reviewed independently and reported as above with low level metabolic activity of left upper lobe lesion.  Other pulmonary nodules not reveal any hypermetabolic activity.  No intervention is needed at this time.  Repeat CT scan in 1 year to assess for any interval change.  Patient will then follow-up 1 to 2 days later to discuss the results.    Patient expressed understanding and was in  agreement with this plan. She also understands that She can call clinic at any time with any questions, concerns, or complaints.   Cancer Staging Basal cell carcinoma, leg, right Staging form: Melanoma of the Skin, AJCC 7th Edition - Clinical: No stage assigned - Unsigned   Lloyd Huger, MD   11/10/2019 7:50 AM

## 2019-11-06 ENCOUNTER — Telehealth: Payer: Self-pay | Admitting: *Deleted

## 2019-11-06 ENCOUNTER — Other Ambulatory Visit: Payer: Self-pay

## 2019-11-06 ENCOUNTER — Encounter
Admission: RE | Admit: 2019-11-06 | Discharge: 2019-11-06 | Disposition: A | Discharge status: Home / Self Care | Payer: Medicare HMO | Source: Ambulatory Visit | Attending: Oncology | Admitting: Oncology

## 2019-11-06 DIAGNOSIS — R918 Other nonspecific abnormal finding of lung field: Secondary | ICD-10-CM | POA: Insufficient documentation

## 2019-11-06 DIAGNOSIS — C44712 Basal cell carcinoma of skin of right lower limb, including hip: Secondary | ICD-10-CM

## 2019-11-06 DIAGNOSIS — R911 Solitary pulmonary nodule: Secondary | ICD-10-CM

## 2019-11-06 DIAGNOSIS — Z79899 Other long term (current) drug therapy: Secondary | ICD-10-CM | POA: Diagnosis not present

## 2019-11-06 DIAGNOSIS — C4491 Basal cell carcinoma of skin, unspecified: Secondary | ICD-10-CM | POA: Diagnosis not present

## 2019-11-06 LAB — GLUCOSE, CAPILLARY: Glucose-Capillary: 103 mg/dL — ABNORMAL HIGH (ref 70–99)

## 2019-11-06 MED ORDER — FLUDEOXYGLUCOSE F - 18 (FDG) INJECTION
6.6100 | Freq: Once | INTRAVENOUS | Status: AC | PRN
  Administered 2019-11-06: 6.61 via INTRAVENOUS

## 2019-11-06 NOTE — Telephone Encounter (Signed)
Call report of PET Scan  1. No metastatic Basal Cell Carcinoma  2. 2 Stable LUL nodules larger one with low metabolic activity consistent with lung nodules new from 08/25/16. Recommend follow up Thorax CT 6-12 months

## 2019-11-07 NOTE — Progress Notes (Signed)
Patient is coming in for results of her scan. Can do telephone visit, or in person. Does not have capabilities for video visits.

## 2019-11-08 ENCOUNTER — Other Ambulatory Visit: Payer: Self-pay

## 2019-11-08 ENCOUNTER — Inpatient Hospital Stay: Payer: Medicare HMO | Attending: Oncology | Admitting: Oncology

## 2019-11-08 VITALS — BP 232/113 | HR 108 | Temp 98.3°F | Wt 123.7 lb

## 2019-11-08 DIAGNOSIS — C44712 Basal cell carcinoma of skin of right lower limb, including hip: Secondary | ICD-10-CM

## 2019-11-08 DIAGNOSIS — R918 Other nonspecific abnormal finding of lung field: Secondary | ICD-10-CM | POA: Insufficient documentation

## 2019-11-08 DIAGNOSIS — Z85828 Personal history of other malignant neoplasm of skin: Secondary | ICD-10-CM | POA: Insufficient documentation

## 2019-11-08 DIAGNOSIS — Z862 Personal history of diseases of the blood and blood-forming organs and certain disorders involving the immune mechanism: Secondary | ICD-10-CM | POA: Insufficient documentation

## 2019-11-08 DIAGNOSIS — I10 Essential (primary) hypertension: Secondary | ICD-10-CM | POA: Insufficient documentation

## 2019-11-11 DIAGNOSIS — M810 Age-related osteoporosis without current pathological fracture: Secondary | ICD-10-CM | POA: Diagnosis not present

## 2019-11-25 IMAGING — CT CT CHEST W/ CM
2 of 3 series · 15 of 36 positions shown, 18 images · IV contrast (omnipaque)
Comparison: Chest CT 03/12/2018.

CLINICAL DATA: 66-year-old female with history of constant
congestion since carbon monoxide exposure. Shortness of breath.

EXAM:
CT CHEST WITH CONTRAST
TECHNIQUE: Multidetector CT imaging of the chest was performed during
intravenous contrast administration.
CONTRAST:  75mL OMNIPAQUE IOHEXOL 300 MG/ML  SOLN

[Series 2: axial st · axial · 0.54mm/px · z∈[-555,-313]mm · 12 of 143 slices shown, 15 images]
[im 11/143  mediastinal]
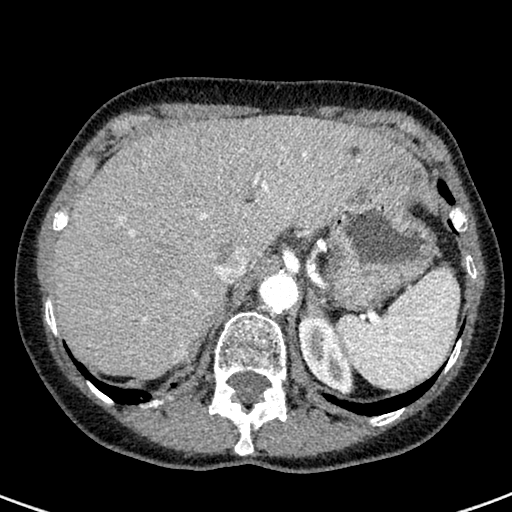
[im 11/143  lung]
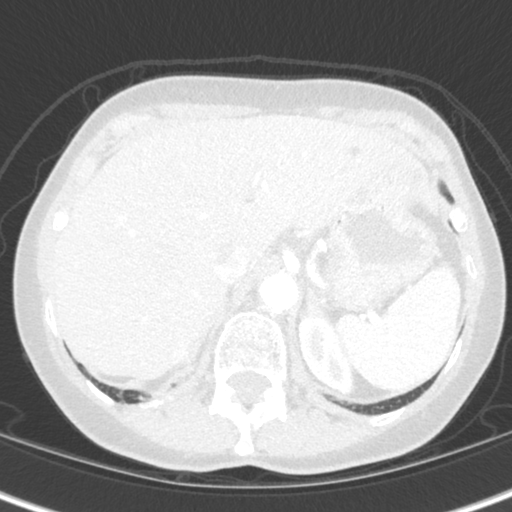
[im 22/143  lung]
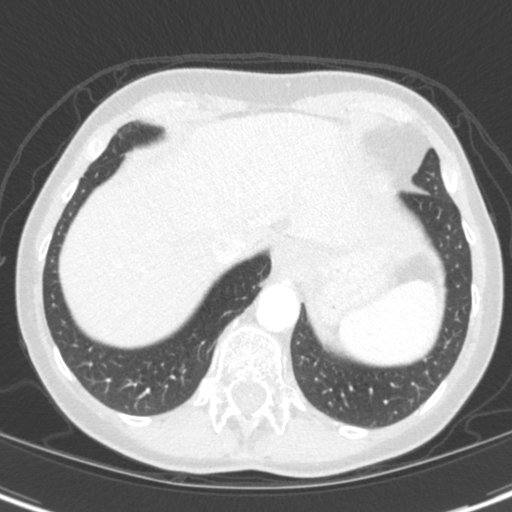
[im 32/143  lung]
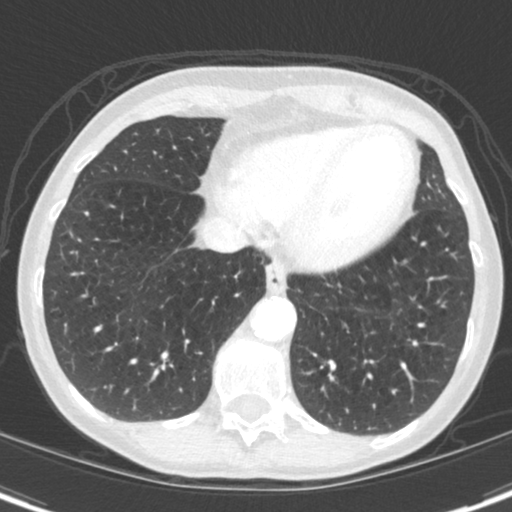
[im 43/143  lung]
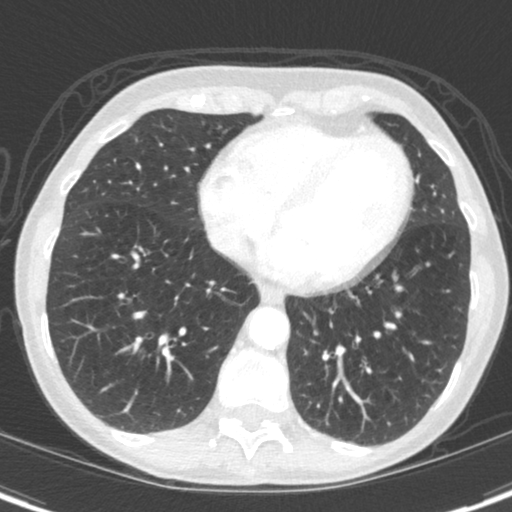
[im 53/143  mediastinal]
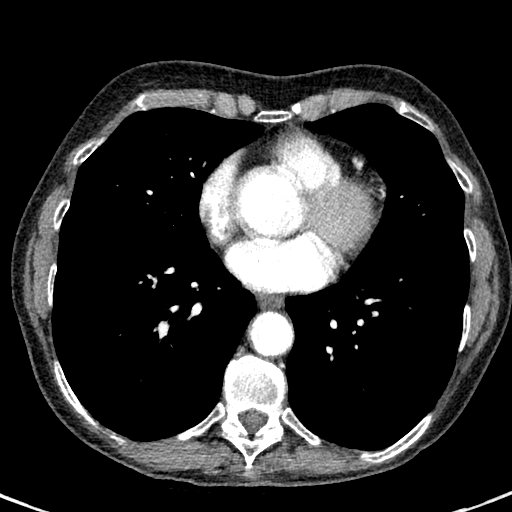
[im 53/143  lung]
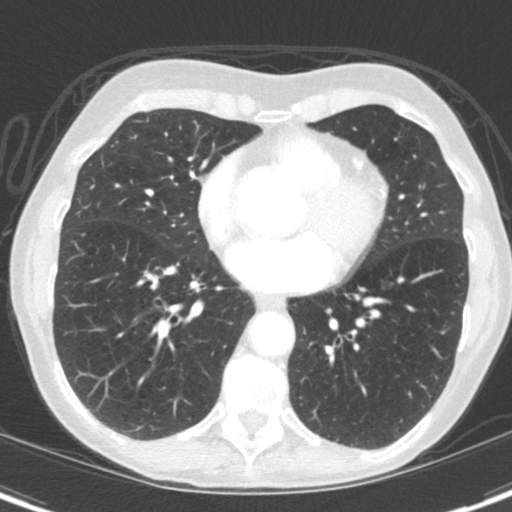
[im 64/143  lung]
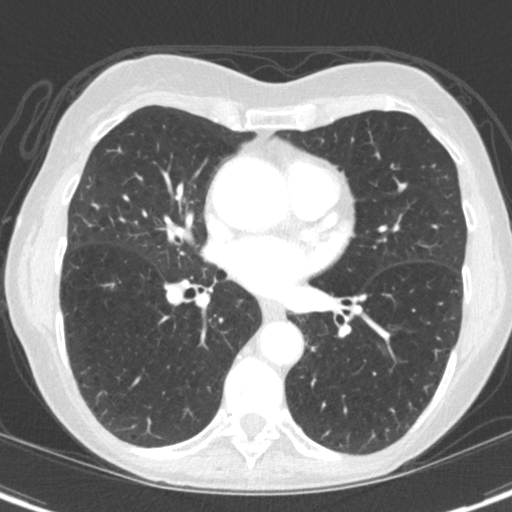
[im 79/143  lung]
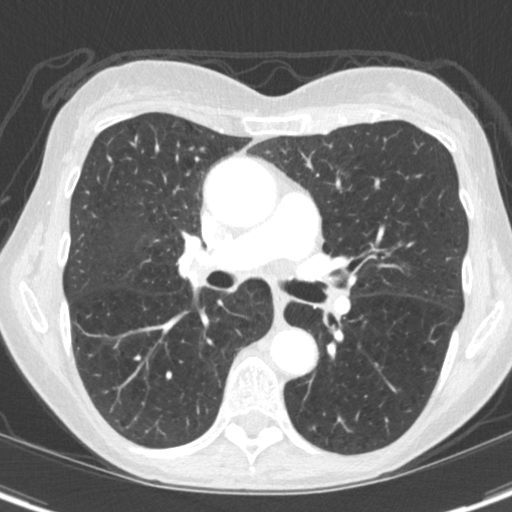
[im 90/143  lung]
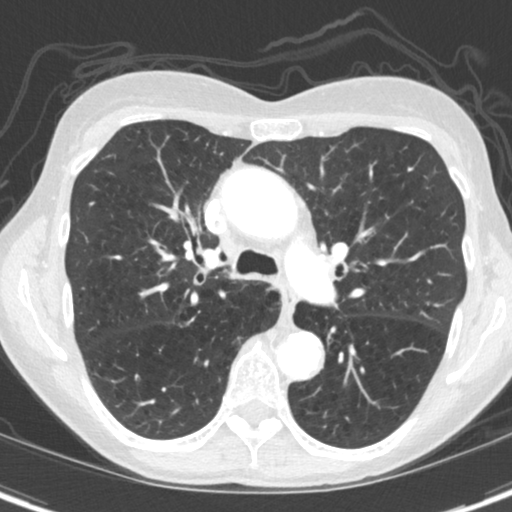
[im 100/143  mediastinal]
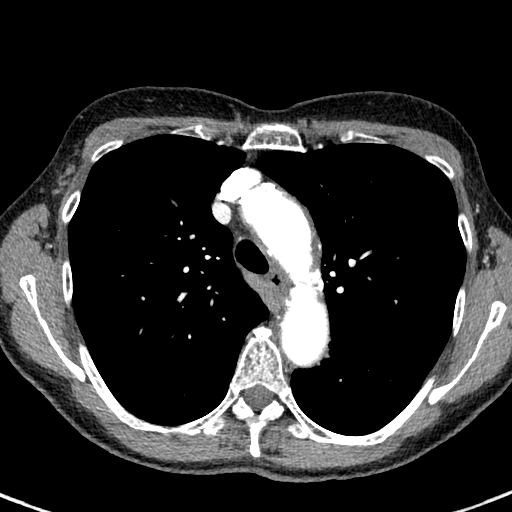
[im 100/143  lung]
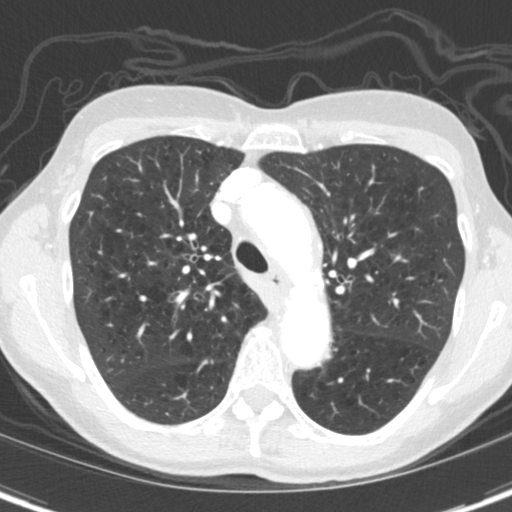
[im 111/143  lung]
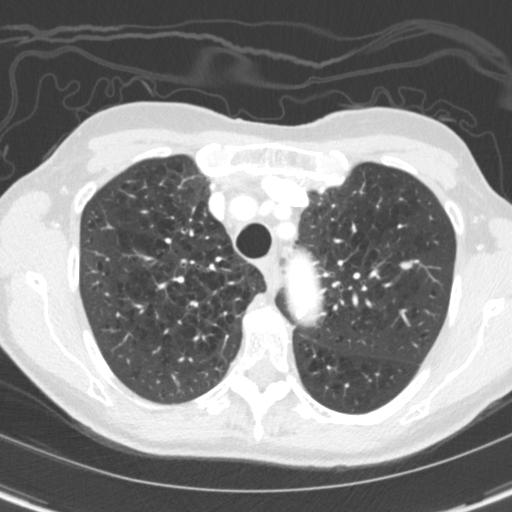
[im 121/143  lung]
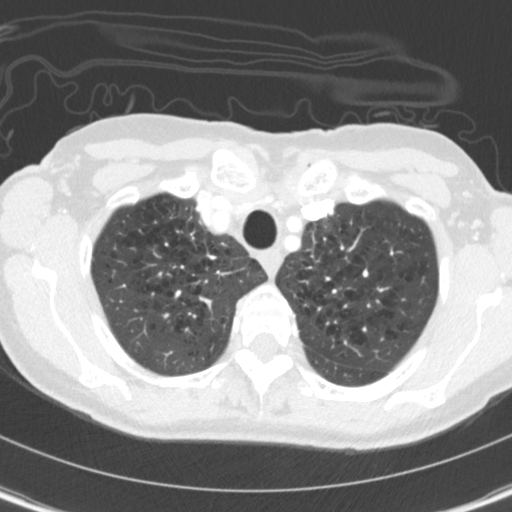
[im 132/143  lung]
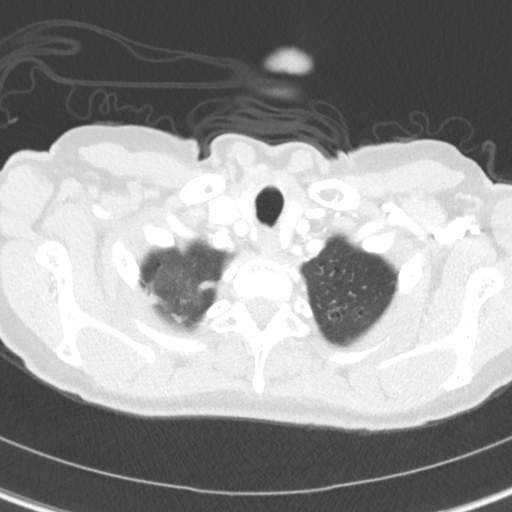

[Series 5: coronal · coronal · 0.58mm/px · 3 of 110 slices shown]
[im 22/110  lung]
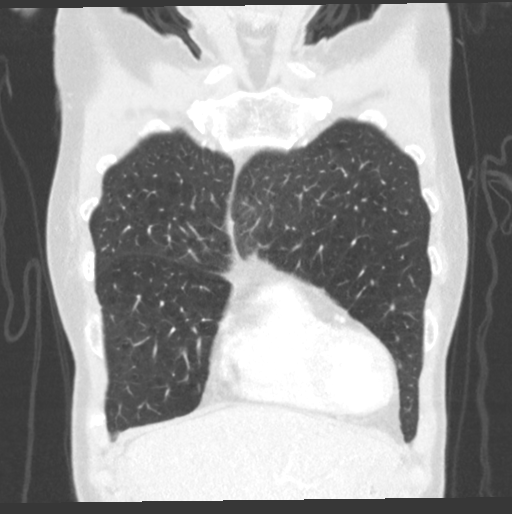
[im 44/110  lung]
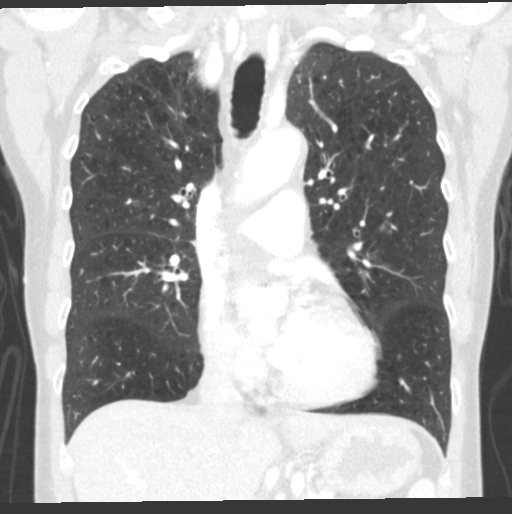
[im 66/110  lung]
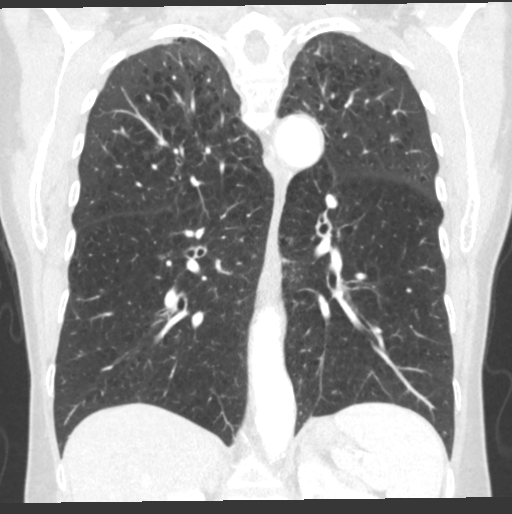

[15 of 36 positions shown; findings below may reference images not displayed]

FINDINGS: Cardiovascular: Heart size is normal. There is no significant
pericardial fluid, thickening or pericardial calcification. There is
aortic atherosclerosis, as well as atherosclerosis of the great
vessels of the mediastinum and the coronary arteries, including
calcified atherosclerotic plaque in the left main, left anterior
descending and right coronary arteries. Thickening calcification of
the aortic valve.

Mediastinum/Nodes: No pathologically enlarged mediastinal or hilar
lymph nodes. Esophagus is unremarkable in appearance. No axillary
lymphadenopathy.

Lungs/Pleura: 8 x 5 mm nodule in the medial aspect of the left upper
lobe near the apex (axial image 14 of series 3), stable compared to
the recent prior chest CT 03/12/2014. 8 x 5 mm left upper lobe
nodule (axial image 34 of series 3), also unchanged. 4 mm right
upper lobe nodule (axial image 57 of series 3), stable. 11 x 9 mm
sub solid nodular area of architectural distortion with internal
lucencies in the right lower lobe (axial image 62 of series 3),
unchanged. No other suspicious appearing pulmonary nodules or masses
are noted. No acute consolidative airspace disease. No pleural
effusions. Diffuse bronchial wall thickening with moderate
centrilobular emphysema.

Upper Abdomen: Subcentimeter low-attenuation lesion in segment 2 of
the liver, incompletely characterized on today's noncontrast CT
examination, but stable compared to the prior study, statistically
likely a cyst. Aortic atherosclerosis.

Musculoskeletal: There are no aggressive appearing lytic or blastic
lesions noted in the visualized portions of the skeleton.
IMPRESSION: 1. All previously noted pulmonary nodules appear stable in size and
number compared to the recent prior examination. This is reassuring,
however, these warrant continued attention on follow-up studies.
Repeat chest CT is now recommended in 12 months. This recommendation
follows the consensus statement: Guidelines for Management of
Incidental Pulmonary Nodules Detected on CT Images: From the
2. Diffuse bronchial wall thickening with moderate centrilobular
emphysema; imaging findings suggestive of underlying COPD.
3. Aortic atherosclerosis, in addition to left main and 2 vessel
coronary artery disease. Please note that although the presence of
coronary artery calcium documents the presence of coronary artery
disease, the severity of this disease and any potential stenosis
cannot be assessed on this non-gated CT examination. Assessment for
potential risk factor modification, dietary therapy or pharmacologic
therapy may be warranted, if clinically indicated.
4. There is thickening calcification of the aortic valve.
Echocardiographic correlation for evaluation of potential valvular
dysfunction may be warranted if clinically indicated.

Aortic Atherosclerosis (1XY49-H9R.R) and Emphysema (1XY49-GOR.7).

## 2020-02-13 ENCOUNTER — Ambulatory Visit (INDEPENDENT_AMBULATORY_CARE_PROVIDER_SITE_OTHER): Payer: Medicare HMO | Admitting: Family Medicine

## 2020-02-13 ENCOUNTER — Other Ambulatory Visit: Payer: Self-pay

## 2020-02-13 ENCOUNTER — Encounter: Payer: Self-pay | Admitting: Family Medicine

## 2020-02-13 VITALS — BP 220/110 | HR 95 | Temp 96.9°F | Resp 16 | Ht 63.0 in | Wt 116.9 lb

## 2020-02-13 DIAGNOSIS — K861 Other chronic pancreatitis: Secondary | ICD-10-CM

## 2020-02-13 DIAGNOSIS — I712 Thoracic aortic aneurysm, without rupture, unspecified: Secondary | ICD-10-CM

## 2020-02-13 DIAGNOSIS — J3089 Other allergic rhinitis: Secondary | ICD-10-CM | POA: Diagnosis not present

## 2020-02-13 DIAGNOSIS — I10 Essential (primary) hypertension: Secondary | ICD-10-CM | POA: Diagnosis not present

## 2020-02-13 DIAGNOSIS — J432 Centrilobular emphysema: Secondary | ICD-10-CM

## 2020-02-13 DIAGNOSIS — J4489 Other specified chronic obstructive pulmonary disease: Secondary | ICD-10-CM

## 2020-02-13 DIAGNOSIS — Z9889 Other specified postprocedural states: Secondary | ICD-10-CM | POA: Diagnosis not present

## 2020-02-13 DIAGNOSIS — M810 Age-related osteoporosis without current pathological fracture: Secondary | ICD-10-CM | POA: Diagnosis not present

## 2020-02-13 DIAGNOSIS — I700 Atherosclerosis of aorta: Secondary | ICD-10-CM

## 2020-02-13 DIAGNOSIS — R69 Illness, unspecified: Secondary | ICD-10-CM | POA: Diagnosis not present

## 2020-02-13 DIAGNOSIS — R739 Hyperglycemia, unspecified: Secondary | ICD-10-CM | POA: Diagnosis not present

## 2020-02-13 DIAGNOSIS — J449 Chronic obstructive pulmonary disease, unspecified: Secondary | ICD-10-CM | POA: Diagnosis not present

## 2020-02-13 DIAGNOSIS — J4540 Moderate persistent asthma, uncomplicated: Secondary | ICD-10-CM | POA: Diagnosis not present

## 2020-02-13 DIAGNOSIS — E785 Hyperlipidemia, unspecified: Secondary | ICD-10-CM | POA: Diagnosis not present

## 2020-02-13 DIAGNOSIS — J302 Other seasonal allergic rhinitis: Secondary | ICD-10-CM

## 2020-02-13 DIAGNOSIS — Z85828 Personal history of other malignant neoplasm of skin: Secondary | ICD-10-CM

## 2020-02-13 DIAGNOSIS — F325 Major depressive disorder, single episode, in full remission: Secondary | ICD-10-CM

## 2020-02-13 MED ORDER — MONTELUKAST SODIUM 10 MG PO TABS: 10.0000 mg | ORAL_TABLET | Freq: Every day | ORAL | 1 refills | Status: DC

## 2020-02-13 MED ORDER — DIAZEPAM 5 MG PO TABS: 5.0000 mg | ORAL_TABLET | Freq: Every day | ORAL | 0 refills | Status: DC | PRN

## 2020-02-13 MED ORDER — IPRATROPIUM-ALBUTEROL 20-100 MCG/ACT IN AERS: 1.0000 | INHALATION_SPRAY | Freq: Four times a day (QID) | RESPIRATORY_TRACT | 5 refills | Status: DC

## 2020-02-13 MED ORDER — BREO ELLIPTA 200-25 MCG/INH IN AEPB: 1.0000 | INHALATION_SPRAY | Freq: Every day | RESPIRATORY_TRACT | 5 refills | Status: DC

## 2020-02-13 MED ORDER — AMLODIPINE BESYLATE-VALSARTAN 5-160 MG PO TABS: 1.0000 | ORAL_TABLET | Freq: Every day | ORAL | 0 refills | Status: DC

## 2020-02-13 NOTE — Progress Notes (Signed)
Name: Patricia Galloway   MRN: [id number]    DOB: 03-Jan-1951   Date:02/13/2020       Progress Note  Subjective  Chief Complaint  Chief Complaint  Patient presents with  . Asthma  . White coat syndrome  . Hyperglycemia  . Depression  . Panic Attack    HPI  Basal cell carcinoma right leg:she is doing well, s/p removal and chemotherapy.Her gait is back to normal , full rom of motion. Under the care of Dr. Grayland Ormond , recent PET scan negative   Lung nodules: monitored by Dr. Grayland Ormond , one new nodule on PET scan done 11/06/2019 and advised to follow up CT lung 6-12 months.   Hyperlipidemia: The ASCVD Risk score [name] DC Jr., et al., 2013) failed to calculate for the following reasons:   The valid systolic blood pressure range is 90 to 200 mmHg  She refuses statin therapy, last ASCVD was over 17%, explained risk of heart attacks and strokes   HTN with white coat: she started HCTZ Feb 2020, however she feels tired daily since she started medication, discussed 24 hours bp monitor and referral to cardiologist and pulmonologist, however she states even checking her bp at home it makes her nervous, and bp spikes, she has been taking Valium prior to doctor's visits but does not seem to decrease bp reading. BP today was higher than usual for her, she has not been taking HCTZ because is causing urinary frequency, discussed importance of bp control, we will try Exforge start with half dose, come by for bp check in one week and if tolerating it well we will adjust dose   Major Depression in remission:she is still doing well, phq 9 is back to normal, however she was tearful during her visit. She has been taking Valerian Root . Her husband has cancer, she has been the caregiver, she has been doing all the house chores and is feeling very tired. Also has difficulty sleeping at night. Denies any recent panic attacks but seems to still feel anxious. She refuses medications   Asthma/COPD: she was  switched to Trelegy from [place]. She is concerned about cost, she also states Trelegy did not help as much, so she asked to go back on Breo without Incruse on her last visit. She states she has daily cough with white sputum, also has SOB with activity and wheezing daily. She uses albuterol and it helps her within 5 minutes. She does not want to go back to Trelegy but would like to try combivent instead since she used Ipratropium many years ago and it worked well for her. She does not want to see pulmonologist   Atherosclerosis aorta: refuses statin therapy also not taking aspirin. Reviewed PET scan from 11/06/2019   Osteoporosis: discussed options, she was seen by Dr Gabriel Carina 03/2018 and was given Alendronate but she states never started medication, she states she has a high calcium diet and some otc supplementations. She is afraid of side effects   Chronic Pancreatitis: found on CT abdomen and also on PET scan done on 11/06/2019 , denies nausea, vomiting or indigestion. No abdominal pain    Patient Active Problem List   Diagnosis Date Noted  . Pain in limb 03/29/2017  . Basal cell carcinoma 03/29/2017  . Thoracic aortic aneurysm without rupture ([place]) 10/31/2016  . Atherosclerosis of aorta ([place]) 10/31/2016  . Chronic pancreatitis ([place]) 10/31/2016  . Pulmonary nodule 10/31/2016  . Centrilobular emphysema ([place]) 10/31/2016  . Iron deficiency anemia due  to chronic blood loss 10/24/2016  . Basal cell carcinoma, leg, right 08/24/2016  . Hyperglycemia 08/01/2016  . Chronic thoracic back pain 08/04/2015  . Panic attack 08/04/2015  . White coat syndrome without diagnosis of hypertension 08/04/2015  . Major depression, recurrent ([place]) 08/04/2015  . Asthma, moderate persistent, poorly-controlled 08/04/2015    Past Surgical History:  Procedure Laterality Date  . BASAL CELL CARCINOMA EXCISION Right 03/29/2017   Procedure: EXCISION OF RIGHT LEG BASEL CELL CARCINOMA;  Surgeon: [name], DO;  Location: [facility];  Service: Plastics;  Laterality: Right;  . COLON SURGERY  2002   colostomy for 10 months after a perfurated sigmoid   . COLOSTOMY REVERSAL  2003  . EMBOLIZATION Right 10/12/2016   Procedure: Embolization;  Surgeon: [name], MD;  Location: [facility];  Service: Cardiovascular;  Laterality: Right;  . etopic  8182,9937  . INCISION AND DRAINAGE OF WOUND Right 03/30/2017   Procedure: IRRIGATION AND DEBRIDEMENT WOUND; VAC PLACEMENT;  Surgeon: [name], DO;  Location: [facility];  Service: Plastics;  Laterality: Right;  . TONSILLECTOMY      Family History  Problem Relation Age of Onset  . Dementia Mother   . Aortic stenosis Mother   . Osteoporosis Mother   . Dementia Father   . Diabetes Father   . Hyperlipidemia Father   . Hypertension Father   . Aneurysm Sister   . Hyperlipidemia Sister   . Hypertension Sister   . Diabetes Sister   . Breast cancer Neg Hx     Social History   Tobacco Use  . Smoking status: Former Smoker    Packs/day: 0.50    Years: 25.00    Pack years: 12.50    Types: Cigarettes    Start date: 09/05/1972    Quit date: 08/03/1998    Years since quitting: 21.5  . Smokeless tobacco: Never Used  . Tobacco comment: smoking cessation materials not required  Substance Use Topics  . Alcohol use: No    Alcohol/week: 0.0 standard drinks     Current Outpatient Medications:  .  acidophilus (RISAQUAD) CAPS capsule, Take 3 capsules by mouth at bedtime., Disp: , Rfl:  .  albuterol (VENTOLIN HFA) 108 (90 Base) MCG/ACT inhaler, Inhale 2 puffs into the lungs every 6 (six) hours as needed for wheezing or shortness of breath., Disp: 18 g, Rfl: 0 .  BREO ELLIPTA 200-25 MCG/INH AEPB, TAKE 1 PUFF BY MOUTH EVERY DAY, Disp: 60 each, Rfl: 5 .  calcium carbonate (OS-CAL - DOSED IN MG OF ELEMENTAL CALCIUM) 1250 (500 Ca) MG tablet, Take by mouth., Disp: , Rfl:  .  cholecalciferol (VITAMIN D) 1000 units tablet, Take 1,000 Units  by mouth 2 (two) times daily., Disp: , Rfl:  .  Coenzyme Q10 (COQ10) 400 MG CAPS, Take 400 mg by mouth 2 (two) times daily. , Disp: , Rfl:  .  diazepam (VALIUM) 5 MG tablet, Take 1 tablet (5 mg total) by mouth daily as needed for anxiety. 30 minutes before doctor's visit, Disp: 30 tablet, Rfl: 0 .  Digestive Enzyme CAPS, Take 1 capsule by mouth 3 (three) times daily after meals. , Disp: , Rfl:  .  ELDERBERRY PO, Take by mouth. Every Winter, Disp: , Rfl:  .  Ginkgo Biloba Extract (GNP GINGKO BILOBA EXTRACT) 60 MG CAPS, Take 60 mg by mouth as needed. , Disp: , Rfl:  .  hydrochlorothiazide (HYDRODIURIL) 12.5 MG tablet, TAKE 1 TABLET BY MOUTH EVERY DAY, Disp:  90 tablet, Rfl: 0 .  magnesium 30 MG tablet, Take 60 mg by mouth daily. , Disp: , Rfl:  .  Multiple Vitamin (MULTIVITAMIN) capsule, Take 2 capsules by mouth daily. , Disp: , Rfl:  .  OVER THE COUNTER MEDICATION, Take 1 tablet by mouth 2 (two) times daily. Nutri-Calm, Disp: , Rfl:  .  Spirulina 500 MG TABS, Take 500 mg by mouth daily. , Disp: , Rfl:  .  UNABLE TO FIND, Med Name: Koren Bound Balm  Only with radiation, Disp: , Rfl:  .  UNABLE TO FIND, Med Name: liver care, Disp: , Rfl:   Allergies  Allergen Reactions  . Penicillins Anaphylaxis    Has patient had a PCN reaction causing immediate rash, facial/tongue/throat swelling, SOB or lightheadedness with hypotension: Yes Has patient had a PCN reaction causing severe rash involving mucus membranes or skin necrosis: No Has patient had a PCN reaction that required hospitalization: No Has patient had a PCN reaction occurring within the last 10 years: No If all of the above answers are "NO", then may proceed with Cephalosporin use.   . Codeine Nausea And Vomiting  . Latex Itching  . Sulfa Antibiotics Other (See Comments)    TOPICAL-SULFA CAUSED BLISTERS  . Tape Other (See Comments)    Reaction:  Blisters and burning  Pt states that paper tape is okay.    [address] [facility]] Anxiety     I personally reviewed active problem list, medication list, allergies, family history, social history, health maintenance with the patient/caregiver today.   ROS  Constitutional: Negative for fever or weight change.  Respiratory: Positive  for cough and shortness of breath.   Cardiovascular: Negative for chest pain or palpitations.  Gastrointestinal: Negative for abdominal pain, no bowel changes.  Musculoskeletal: Negative for gait problem or joint swelling.  Skin: Negative for rash.  Neurological: Negative for dizziness or headache.  No other specific complaints in a complete review of systems (except as listed in HPI above).  Objective  Vitals:   02/13/20 1005  BP: (!) 220/120  Pulse: 95  Resp: 16  Temp: (!) 96.9 F (36.1 C)  TempSrc: Temporal  SpO2: 97%  Weight: 116 lb 14.4 oz (53 kg)  Height: 5\' 3"  (1.6 m)    Body mass index is 20.71 kg/m.  Physical Exam  Constitutional: Patient appears well-developed and well-nourished.No distress.  HEENT: head atraumatic, normocephalic, pupils equal and reactive to light, neck supple Cardiovascular: Normal rate, regular rhythm and normal heart sounds.  No murmur heard. No BLE edema. Pulmonary/Chest: Effort normal and breath sounds normal. No respiratory distress. Abdominal: Soft.  There is no tenderness. Muscular skeletal: atrophic area on right inner thigh, area of resection of basal cell  Psychiatric: Patient has a normal mood and affect. behavior is normal. Judgment and thought content normal.  PHQ2/9: Depression screen [facility] 2/9 02/13/2020 07/02/2019 03/05/2019 11/16/2018 08/16/2018  Decreased Interest 0 0 0 0 0  Down, Depressed, Hopeless 0 0 0 0 0  PHQ - 2 Score 0 0 0 0 0  Altered sleeping 1 - 0 1 0  Tired, decreased energy 1 - 2 1 1   Change in appetite 0 - 0 0 0  Feeling bad or failure about yourself  0 - 0 0 0  Trouble concentrating 0 - 0 0 0  Moving slowly or fidgety/restless 0 - 0 0 0  Suicidal thoughts 0 - 0 0 0   PHQ-9 Score 2 - 2 2 1   Difficult doing work/chores Not  difficult at all - Not difficult at all Not difficult at all Somewhat difficult    phq 9 is negative   Fall Risk: Fall Risk  02/13/2020 07/02/2019 03/05/2019 11/16/2018 08/16/2018  Falls in the past year? 0 0 0 0 0  Comment - - - - -  Number falls in past yr: 0 0 0 0 -  Injury with Fall? 0 0 0 0 -  Follow up - Falls prevention discussed - - -     Functional Status Survey: Is the patient deaf or have difficulty hearing?: No Does the patient have difficulty seeing, even when wearing glasses/contacts?: No Does the patient have difficulty concentrating, remembering, or making decisions?: No Does the patient have difficulty walking or climbing stairs?: No Does the patient have difficulty dressing or bathing?: No Does the patient have difficulty doing errands alone such as visiting a doctor's office or shopping?: No    Assessment & Plan  1. Centrilobular emphysema ([place])  - Ipratropium-Albuterol (COMBIVENT) 20-100 MCG/ACT AERS respimat; Inhale 1 puff into the lungs every 6 (six) hours.  Dispense: 4 g; Refill: 5  2. Thoracic aortic aneurysm without rupture ([facility])  Refuses statin therapy   3. History of basal cell carcinoma (BCC) excision  Still has some pain on incisional site  4. Dyslipidemia  - Lipid panel  5. Uncontrolled hypertension  Start at half dose and move it up after a few days, discussed possible side effects of medication - amLODipine-valsartan (EXFORGE) 5-160 MG tablet; Take 1 tablet by mouth daily.  Dispense: 90 tablet; Refill: 0 - TSH - Microalbumin / creatinine urine ratio - COMPLETE METABOLIC PANEL WITH GFR - CBC with Differential/Platelet  6. Age-related osteoporosis without current pathological fracture  Refuses medication, seen by Dr. Gabriel Carina, afraid of side effects  7. Perennial allergic rhinitis with seasonal variation  She cannot tolerate anti-histamines   8. Asthma, moderate persistent,  poorly-controlled  - fluticasone furoate-vilanterol (BREO ELLIPTA) 200-25 MCG/INH AEPB; Inhale 1 puff into the lungs daily.  Dispense: 60 each; Refill: 5 - montelukast (SINGULAIR) 10 MG tablet; Take 1 tablet (10 mg total) by mouth at bedtime.  Dispense: 90 tablet; Refill: 1  9. COPD with asthma ([place])  - fluticasone furoate-vilanterol (BREO ELLIPTA) 200-25 MCG/INH AEPB; Inhale 1 puff into the lungs daily.  Dispense: 60 each; Refill: 5 - montelukast (SINGULAIR) 10 MG tablet; Take 1 tablet (10 mg total) by mouth at bedtime.  Dispense: 90 tablet; Refill: 1 - Ipratropium-Albuterol (COMBIVENT) 20-100 MCG/ACT AERS respimat; Inhale 1 puff into the lungs every 6 (six) hours.  Dispense: 4 g; Refill: 5  10. Depression, major, in remission [facility])  Doing well, she is stressed but doing well   11. Chronic pancreatitis, unspecified pancreatitis type ([place])  She is doing well, no symptoms   12. Elevated blood pressure reading in office with white coat syndrome, with diagnosis of hypertension  She has not been taking HCTZ because it causes urinary frequency , she is wiling to try exforge  13. Atherosclerosis of aorta (HCC)  Refuses statin therapy, she states takes supplements on and off   14. White coat syndrome with hypertension  - diazepam (VALIUM) 5 MG tablet; Take 1 tablet (5 mg total) by mouth daily as needed for anxiety. 30 minutes before doctor's visit  Dispense: 30 tablet; Refill: 0  15. Hyperglycemia  - Hemoglobin A1c

## 2020-02-14 LAB — CBC WITH DIFFERENTIAL/PLATELET
Absolute Monocytes: 1017 cells/uL — ABNORMAL HIGH (ref 200–950)
Basophils Absolute: 66 cells/uL (ref 0–200)
Basophils Relative: 0.8 %
Eosinophils Absolute: 189 cells/uL (ref 15–500)
Eosinophils Relative: 2.3 %
HCT: 44.1 % (ref 35.0–45.0)
Hemoglobin: 14.8 g/dL (ref 11.7–15.5)
Lymphs Abs: 2329 cells/uL (ref 850–3900)
MCH: 30.2 pg (ref 27.0–33.0)
MCHC: 33.6 g/dL (ref 32.0–36.0)
MCV: 90 fL (ref 80.0–100.0)
MPV: 11.2 fL (ref 7.5–12.5)
Monocytes Relative: 12.4 %
Neutro Abs: 4600 cells/uL (ref 1500–7800)
Neutrophils Relative %: 56.1 %
Platelets: 268 10*3/uL (ref 140–400)
RBC: 4.9 10*6/uL (ref 3.80–5.10)
RDW: 13.4 % (ref 11.0–15.0)
Total Lymphocyte: 28.4 %
WBC: 8.2 10*3/uL (ref 3.8–10.8)

## 2020-02-14 LAB — COMPLETE METABOLIC PANEL WITH GFR
AG Ratio: 2 (calc) (ref 1.0–2.5)
ALT: 14 U/L (ref 6–29)
AST: 19 U/L (ref 10–35)
Albumin: 4.8 g/dL (ref 3.6–5.1)
Alkaline phosphatase (APISO): 63 U/L (ref 37–153)
BUN: 14 mg/dL (ref 7–25)
CO2: 29 mmol/L (ref 20–32)
Calcium: 9.9 mg/dL (ref 8.6–10.4)
Chloride: 99 mmol/L (ref 98–110)
Creat: 0.76 mg/dL (ref 0.50–0.99)
GFR, Est African American: 93 mL/min/{1.73_m2} (ref >=60)
GFR, Est Non African American: 81 mL/min/{1.73_m2} (ref >=60)
Globulin: 2.4 g/dL (calc) (ref 1.9–3.7)
Glucose, Bld: 94 mg/dL (ref 65–99)
Potassium: 4.1 mmol/L (ref 3.5–5.3)
Sodium: 137 mmol/L (ref 135–146)
Total Bilirubin: 0.7 mg/dL (ref 0.2–1.2)
Total Protein: 7.2 g/dL (ref 6.1–8.1)

## 2020-02-14 LAB — LIPID PANEL
Cholesterol: 274 mg/dL — ABNORMAL HIGH (ref <200)
HDL: 97 mg/dL (ref >=50)
LDL Cholesterol (Calc): 161 mg/dL (calc) — ABNORMAL HIGH
Non-HDL Cholesterol (Calc): 177 mg/dL (calc) — ABNORMAL HIGH (ref <130)
Total CHOL/HDL Ratio: 2.8 (calc) (ref <5.0)
Triglycerides: 67 mg/dL (ref <150)

## 2020-02-14 LAB — MICROALBUMIN / CREATININE URINE RATIO
Creatinine, Urine: 32 mg/dL (ref 20–275)
Microalb Creat Ratio: 22 mcg/mg creat (ref <30)
Microalb, Ur: 0.7 mg/dL

## 2020-02-14 LAB — HEMOGLOBIN A1C
Hgb A1c MFr Bld: 5.2 % of total Hgb (ref <5.7)
Mean Plasma Glucose: 103 (calc)
eAG (mmol/L): 5.7 (calc)

## 2020-02-14 LAB — TSH: TSH: 1.98 mIU/L (ref 0.40–4.50)

## 2020-02-18 ENCOUNTER — Telehealth: Payer: Self-pay | Admitting: Family Medicine

## 2020-02-18 NOTE — Chronic Care Management (AMB) (Signed)
  Chronic Care Management   Note  02/18/2020 Name: Patricia Galloway MRN: [id number] DOB: August 01, 1951  Patricia Galloway is a 69 y.o. year old female who is a primary care patient of [name], MD. I reached out to Jacqlyn Krauss by phone today in response to a referral sent by Patricia Galloway's health plan.     Patricia Galloway was given information about Chronic Care Management services today including:  1. CCM service includes personalized support from designated clinical staff supervised by her physician, including individualized plan of care and coordination with other care providers 2. 24/7 contact phone numbers for assistance for urgent and routine care needs. 3. Service will only be billed when office clinical staff spend 20 minutes or more in a month to coordinate care. 4. Only one practitioner may furnish and bill the service in a calendar month. 5. The patient may stop CCM services at any time (effective at the end of the month) by phone call to the office staff. 6. The patient will be responsible for cost sharing (co-pay) of up to 20% of the service fee (after annual deductible is met).  Patient agreed to services and verbal consent obtained.   Follow up plan: Telephone appointment with care management team member scheduled for: 03/16/2020.  [place], [place] [address] Direct Dial: [phone] [email or web address] Website: [email or web address]

## 2020-03-16 ENCOUNTER — Telehealth: Payer: Medicare HMO

## 2020-03-19 ENCOUNTER — Encounter: Payer: Self-pay | Admitting: Family Medicine

## 2020-03-20 ENCOUNTER — Other Ambulatory Visit: Payer: Self-pay | Admitting: Family Medicine

## 2020-03-20 MED ORDER — ALBUTEROL SULFATE HFA 108 (90 BASE) MCG/ACT IN AERS: 2.0000 | INHALATION_SPRAY | Freq: Four times a day (QID) | RESPIRATORY_TRACT | 0 refills | Status: DC | PRN

## 2020-04-03 ENCOUNTER — Other Ambulatory Visit: Payer: Self-pay

## 2020-04-03 ENCOUNTER — Ambulatory Visit (INDEPENDENT_AMBULATORY_CARE_PROVIDER_SITE_OTHER): Payer: Medicare HMO

## 2020-04-03 DIAGNOSIS — I10 Essential (primary) hypertension: Secondary | ICD-10-CM | POA: Diagnosis not present

## 2020-04-03 DIAGNOSIS — J432 Centrilobular emphysema: Secondary | ICD-10-CM

## 2020-04-03 DIAGNOSIS — E785 Hyperlipidemia, unspecified: Secondary | ICD-10-CM

## 2020-04-06 ENCOUNTER — Telehealth: Payer: Self-pay | Admitting: *Deleted

## 2020-04-06 NOTE — Chronic Care Management (AMB) (Signed)
Chronic Care Management   Initial Visit Note   Name: Patricia Galloway MRN: [id number] DOB: July 27, 1951  Primary Care Provider: [name], MD Reason for referral : Chronic Care Management   Patricia Galloway is a 69 y.o. year old female who is a primary care patient of [name], MD. The CCM team was consulted for assistance with chronic disease management and care coordination. A telephonic assessment was conducted today.  Review of Patricia Galloway status, including review of consultants reports, relevant labs and test results was conducted today. Collaboration with appropriate care team members was performed as part of the comprehensive evaluation and provision of chronic care management services.    SDOH (Social Determinants of Health) assessments performed: Yes See Care Plan activities for detailed interventions related to SDOH  SDOH Interventions     Most Recent Value  SDOH Interventions  Food Insecurity Interventions Intervention Not Indicated  Housing Interventions Intervention Not Indicated  Transportation Interventions Intervention Not Indicated       Medications: Outpatient Encounter Medications as of 04/03/2020  Medication Sig Note  . acidophilus (RISAQUAD) CAPS capsule Take 3 capsules by mouth at bedtime.   Marland Kitchen albuterol (VENTOLIN HFA) 108 (90 Base) MCG/ACT inhaler Inhale 2 puffs into the lungs every 6 (six) hours as needed for wheezing or shortness of breath.   Marland Kitchen amLODipine-valsartan (EXFORGE) 5-160 MG tablet Take 1 tablet by mouth daily.   . calcium carbonate (OS-CAL - DOSED IN MG OF ELEMENTAL CALCIUM) 1250 (500 Ca) MG tablet Take by mouth.   . cholecalciferol (VITAMIN D) 1000 units tablet Take 1,000 Units by mouth 2 (two) times daily. 09/11/2018: Takes 2000iu every morning  . Coenzyme Q10 (COQ10) 400 MG CAPS Take 400 mg by mouth 2 (two) times daily.    . diazepam (VALIUM) 5 MG tablet Take 1 tablet (5 mg total) by mouth daily as needed for anxiety. 30 minutes before  doctor's visit   . Digestive Enzyme CAPS Take 1 capsule by mouth 3 (three) times daily after meals.    Marland Kitchen ELDERBERRY PO Take by mouth. Every Winter   . fluticasone furoate-vilanterol (BREO ELLIPTA) 200-25 MCG/INH AEPB Inhale 1 puff into the lungs daily.   . Ginkgo Biloba Extract (GNP GINGKO BILOBA EXTRACT) 60 MG CAPS Take 60 mg by mouth as needed.  09/11/2018: Only with radiation exposure  . Ipratropium-Albuterol (COMBIVENT) 20-100 MCG/ACT AERS respimat Inhale 1 puff into the lungs every 6 (six) hours.   . magnesium 30 MG tablet Take 60 mg by mouth daily.    . montelukast (SINGULAIR) 10 MG tablet Take 1 tablet (10 mg total) by mouth at bedtime.   . Multiple Vitamin (MULTIVITAMIN) capsule Take 2 capsules by mouth daily.    Marland Kitchen OVER THE COUNTER MEDICATION Take 1 tablet by mouth 2 (two) times daily. Nutri-Calm 09/11/2018: Only with radiation  . Spirulina 500 MG TABS Take 500 mg by mouth daily.  09/11/2018: Only with radiation  . UNABLE TO FIND Med Name: Alinda Dooms  Only with radiation   . UNABLE TO FIND Med Name: liver care    No facility-administered encounter medications on file as of 04/03/2020.     Objective:  BP Readings from Last 3 Encounters:  02/13/20 (!) 220/110  11/08/19 (!) 232/113  07/02/19 (!) 152/92    Lab Results  Component Value Date   CHOL 274 (H) 02/13/2020   HDL 97 02/13/2020   LDLCALC 161 (H) 02/13/2020   TRIG 67 02/13/2020   CHOLHDL 2.8 02/13/2020  Goals Addressed            This Visit's Progress   . Chronic Disease Management       CARE PLAN ENTRY (see longitudinal plan of care for additional care plan information)  Current Barriers:  . Chronic Disease Management support and education needs related to Dyslipidemia, Emphysema, and White Coat Syndrome with HTN. (Hx of Basal Cell Cancer)  Case Manager Clinical Goal(s):  Over the next 120 days, patient will: . Take all medications as prescribed. . Attend all medical appointments as scheduled. . Monitor  blood pressure and record readings. . Follow treatment recommendations to prevent emphysema/COPD exacerbations. . Follow recommended safety measures to prevent falls and injuries. Over the next 45 days, patient will: . Outreach with CCM Pharmacist.  Interventions:  . Inter-disciplinary care team collaboration (see longitudinal plan of care) . Reviewed medications and plans for emphysema management. Advised to continue taking medications as prescribed. Encouraged to keep primary care provider informed of ability to tolerate prescribed regimen. Reports taking medications as prescribed. Indicates having a sensitivity to albuterol. Reports using Isuprel inhalers with good symptom management several years ago. She is interested in discussing options for a similar inhaler that will be covered by her insurance provider.  Agreeable to outreach with the CCM Pharmacist. She does not require supplemental oxygen. Denies chronic cough or recent exacerbations. Reports occasional wheezing when exposed to allergens but otherwise doing well.  Reports monitoring oxygen saturations regularly with readings from 97-99%.  . Discussed history of white coat syndrome. Reviewed established blood pressure parameters when monitoring her BP in the home along with indications for notifying her provider.  Encouraged to monitor routinely if unable to monitor daily and record readings.  . Reviewed scheduled/upcoming appointments. Encouraged to continue attending medical appointments as scheduled to prevent delays in care. Encouraged to notify the care management team with concerns regarding transportation.  . Discussed plans for ongoing care management and follow up. Provided direct contact information for care management team. Currently serving as the primary caregiver for her spouse. Discussed importance of using safety measures in the home when providing assisting. Encouraged to notify care management team if additional assistance is  needed in the home.  Patient Self Care Activities:  . Self administers medications . Attends scheduled provider appointments . Calls pharmacy for medication refills . Performs ADL's independently . Performs IADL's independently . Calls provider office for new concerns or questions   Initial goal documentation         Patricia Galloway was given information about Chronic Care Management services  including:  1. CCM service includes personalized support from designated clinical staff supervised by her physician, including individualized plan of care and coordination with other care providers 2. 24/7 contact phone numbers for assistance for urgent and routine care needs. 3. Service will only be billed when office clinical staff spend 20 minutes or more in a month to coordinate care. 4. Only one practitioner may furnish and bill the service in a calendar month. 5. The patient may stop CCM services at any time (effective at the end of the month) by phone call to the office staff. 6. The patient will be responsible for cost sharing (co-pay) of up to 20% of the service fee (after annual deductible is met).  Patient agreed to services and verbal consent obtained.    PLAN -Will engage CCM Pharmacist. -Will follow-up with Patricia Galloway next month.    [facility] Care Management [phone]

## 2020-04-06 NOTE — Chronic Care Management (AMB) (Signed)
  Chronic Care Management   Note  04/06/2020 Name: Patricia Galloway MRN: [id number] DOB: Feb 21, 1951  Patricia Galloway is a 69 y.o. year old female who is a primary care patient of [name], MD and is actively engaged with the care management team. I reached out to Jacqlyn Krauss by phone today to assist with scheduling an initial visit with the Pharmacist  Follow up plan: Unsuccessful telephone outreach attempt made. A HIPPA compliant phone message was left for the patient providing contact information and requesting a return call. The care management team will reach out to the patient again over the next 7 days. If patient returns call to provider office, please advise to call [facility] at [phone].  [place], [place] [address] Direct Dial: [phone] [email or web address]: [email or web address]

## 2020-04-06 NOTE — Patient Instructions (Addendum)
Thank you for allowing the Chronic Care Management team to participate in your care.   Goals Addressed            This Visit's Progress   . Chronic Disease Management       CARE PLAN ENTRY (see longitudinal plan of care for additional care plan information)  Current Barriers:  . Chronic Disease Management support and education needs related to Dyslipidemia, Emphysema, and White Coat Syndrome with HTN. (Hx of Basal Cell Cancer)  Case Manager Clinical Goal(s):  Over the next 120 days, patient will: . Take all medications as prescribed. . Attend all medical appointments as scheduled. . Monitor blood pressure and record readings. . Follow treatment recommendations to prevent emphysema/COPD exacerbations. . Follow recommended safety measures to prevent falls and injuries. Over the next 45 days, patient will: . Outreach with CCM Pharmacist.  Interventions:  . Inter-disciplinary care team collaboration (see longitudinal plan of care) . Reviewed medications and plans for emphysema management. Advised to continue taking medications as prescribed. Encouraged to keep primary care provider informed of ability to tolerate prescribed regimen. Reports taking medications as prescribed. Indicates having a sensitivity to albuterol. Reports using Isuprel inhalers with good symptom management several years ago. She is interested in discussing options for a similar inhaler that will be covered by her insurance provider.  Agreeable to outreach with the CCM Pharmacist. She does not require supplemental oxygen. Denies chronic cough or recent exacerbations. Reports occasional wheezing when exposed to allergens but otherwise doing well.  Reports monitoring oxygen saturations regularly with readings from 97-99%.  . Discussed history of white coat syndrome. Reviewed established blood pressure parameters when monitoring her BP in the home along with indications for notifying her provider.  Encouraged to monitor  routinely if unable to monitor daily and record readings.  . Reviewed scheduled/upcoming appointments. Encouraged to continue attending medical appointments as scheduled to prevent delays in care. Encouraged to notify the care management team with concerns regarding transportation.  . Discussed plans for ongoing care management and follow up. Provided direct contact information for care management team. Currently serving as the primary caregiver for her spouse. Discussed importance of using safety measures in the home when providing assisting. Encouraged to notify care management team if additional assistance is needed in the home.  Patient Self Care Activities:  . Self administers medications . Attends scheduled provider appointments . Calls pharmacy for medication refills . Performs ADL's independently . Performs IADL's independently . Calls provider office for new concerns or questions   Initial goal documentation        Ms. Polio was given information about Chronic Care Management services  including:  1. CCM service includes personalized support from designated clinical staff supervised by her physician, including individualized plan of care and coordination with other care providers 2. 24/7 contact phone numbers for assistance for urgent and routine care needs. 3. Service will only be billed when office clinical staff spend 20 minutes or more in a month to coordinate care. 4. Only one practitioner may furnish and bill the service in a calendar month. 5. The patient may stop CCM services at any time (effective at the end of the month) by phone call to the office staff. 6. The patient will be responsible for cost sharing (co-pay) of up to 20% of the service fee (after annual deductible is met).  Patient agreed to services and verbal consent obtained.     Mrs. Mcferran verbalized understanding of the information discussed during the telephonic  outreach today. Declined need for a  mailed/printed copy of the instructions.   PLAN -Will engage CCM Pharmacist. -Will follow-up with Mrs. Slee next month.   [facility] [phone]

## 2020-04-13 ENCOUNTER — Other Ambulatory Visit: Payer: Self-pay | Admitting: Family Medicine

## 2020-04-13 NOTE — Telephone Encounter (Signed)
Requested medication (s) are due for refill today: no  Requested medication (s) are on the active medication list: yes  Last refill: 03/20/2020  Future visit scheduled: yes  Notes to clinic:  One inhaler should last at least one month. If the patient is requesting refills earlier, contact the patient to check for uncontrolled symptoms   Requested Prescriptions  Pending Prescriptions Disp Refills   albuterol (VENTOLIN HFA) 108 (90 Base) MCG/ACT inhaler [Pharmacy Med Name: ALBUTEROL HFA (VENTOLIN) INH]      Sig: TAKE 2 PUFFS BY MOUTH EVERY 6 HOURS AS NEEDED FOR WHEEZE OR SHORTNESS OF BREATH      Pulmonology:  Beta Agonists Failed - 04/13/2020  1:33 AM      Failed - One inhaler should last at least one month. If the patient is requesting refills earlier, contact the patient to check for uncontrolled symptoms.      Passed - Valid encounter within last 12 months    Recent Outpatient Visits           2 months ago Centrilobular emphysema [facility]   [facility] [name], MD   1 year ago Thoracic aortic aneurysm without rupture [facility])   [facility] [name], MD   1 year ago Elevated blood pressure reading in office with white coat syndrome, with diagnosis of hypertension   [facility] [name], MD   1 year ago COPD with asthma [facility])   [facility] [name], MD   2 years ago Asthma, moderate persistent, poorly-controlled   [facility] [name], MD       Future Appointments             In 1 month Ancil Boozer, Drue Stager, MD [facility], [place]   In 2 months  [facility], [place]   In 7 months Grayland Ormond, Kathlene November, MD [facility]

## 2020-04-15 ENCOUNTER — Ambulatory Visit: Payer: Self-pay

## 2020-04-15 NOTE — Chronic Care Management (AMB) (Signed)
  Chronic Care Management   Note  04/15/2020 Name: [name] MRN: [id number] DOB: 29-Dec-1950  Patricia Galloway is a 69 y.o. year old female who is a primary care patient of [name], MD and is actively engaged with the care management team. I reached out to Jacqlyn Krauss by phone today to assist with re-scheduling an initial visit with the Pharmacist.  Follow up plan: Telephone appointment with care management team member scheduled for: 04/16/2020  [place], [place] [facility]  [place], [place] [phone] Direct Dial: [email or web address] Website: [email or web address]

## 2020-04-15 NOTE — Chronic Care Management (AMB) (Signed)
  Chronic Care Management   Note  04/15/2020 Name: Patricia Galloway MRN: [id number] DOB: 06-17-1951   Care Coordination Only: Documents and resources submitted.    Follow up plan: The care management team will follow-up with Patricia Galloway as scheduled.   [facility] [phone]

## 2020-04-16 ENCOUNTER — Other Ambulatory Visit: Payer: Self-pay

## 2020-04-16 ENCOUNTER — Ambulatory Visit (INDEPENDENT_AMBULATORY_CARE_PROVIDER_SITE_OTHER): Payer: Medicare HMO | Admitting: Pharmacist

## 2020-04-16 DIAGNOSIS — I10 Essential (primary) hypertension: Secondary | ICD-10-CM | POA: Diagnosis not present

## 2020-04-16 DIAGNOSIS — J4540 Moderate persistent asthma, uncomplicated: Secondary | ICD-10-CM

## 2020-04-16 DIAGNOSIS — R030 Elevated blood-pressure reading, without diagnosis of hypertension: Secondary | ICD-10-CM

## 2020-04-16 NOTE — Chronic Care Management (AMB) (Signed)
Chronic Care Management Pharmacy  Name: Florella Mcneese  MRN: [id number] DOB: [birth year]  Chief Complaint/ HPI  Jacqlyn Krauss,  69 y.o. , female presents for their Initial CCM visit with the clinical pharmacist via telephone due to COVID-19 Pandemic.  PCP : [name], MD  Their chronic conditions include: asthma, depression, hyperglycemia  Office Visits: 6/10 COPD, [place], BP 220/110 P 95 Wt 117 BMI 20.7, R leg cancer, HCTZ non-adherent, start ExForge, tearful, Valerian Root, insomnia, Trelegy too expensive, start Breo Ellipta/Combivent, Fosamax non-adherent, refuses statin/ASA  Consult Visit: 3/8 osteoporosis, [place], BP 186/100 P 78 Wt 124 BMI 21.28, menopause age 69, former smoker,   Medications: Outpatient Encounter Medications as of 04/16/2020  Medication Sig Note  . albuterol (VENTOLIN HFA) 108 (90 Base) MCG/ACT inhaler TAKE 2 PUFFS BY MOUTH EVERY 6 HOURS AS NEEDED FOR WHEEZE OR SHORTNESS OF BREATH   . amLODipine-valsartan (EXFORGE) 5-160 MG tablet Take 1 tablet by mouth daily. (Patient taking differently: Take 0.5 tablets by mouth daily. )   . calcium carbonate (OS-CAL - DOSED IN MG OF ELEMENTAL CALCIUM) 1250 (500 Ca) MG tablet Take by mouth.   . cholecalciferol (VITAMIN D) 1000 units tablet Take 10,000 Units by mouth 2 (two) times a week.  09/11/2018: Takes 2000iu every morning  . Cholecalciferol (VITAMIN D3) 250 MCG (10000 UT) TABS Take 1 tablet by mouth 2 (two) times a week.   . diazepam (VALIUM) 5 MG tablet Take 1 tablet (5 mg total) by mouth daily as needed for anxiety. 30 minutes before doctor's visit   . ELDERBERRY PO Take by mouth. Every Winter   . fluticasone furoate-vilanterol (BREO ELLIPTA) 200-25 MCG/INH AEPB Inhale 1 puff into the lungs daily.   . Ginkgo Biloba Extract (GNP GINGKO BILOBA EXTRACT) 60 MG CAPS Take 60 mg by mouth as needed.  09/11/2018: Only with radiation exposure  . magnesium 30 MG tablet Take 60 mg by mouth daily.    . montelukast (SINGULAIR) 10  MG tablet Take 1 tablet (10 mg total) by mouth at bedtime.   . Multiple Vitamin (MULTIVITAMIN) capsule Take 2 capsules by mouth daily.    Marland Kitchen OVER THE COUNTER MEDICATION Take 1 tablet by mouth 2 (two) times daily. Nutri-Calm 09/11/2018: Only with radiation  . Spirulina 500 MG TABS Take 500 mg by mouth daily.  09/11/2018: Only with radiation  . UNABLE TO FIND Med Name: Alinda Dooms  Only with radiation   . acidophilus (RISAQUAD) CAPS capsule Take 3 capsules by mouth at bedtime. (Patient not taking: Reported on 04/16/2020)   . Coenzyme Q10 (COQ10) 400 MG CAPS Take 400 mg by mouth 2 (two) times daily.  (Patient not taking: Reported on 04/16/2020)   . Digestive Enzyme CAPS Take 1 capsule by mouth 3 (three) times daily after meals.  (Patient not taking: Reported on 04/16/2020)   . Ipratropium-Albuterol (COMBIVENT) 20-100 MCG/ACT AERS respimat Inhale 1 puff into the lungs every 6 (six) hours. (Patient not taking: Reported on 04/16/2020)   . UNABLE TO FIND Med Name: liver care (Patient not taking: Reported on 04/16/2020)    No facility-administered encounter medications on file as of 04/16/2020.     Financial Resource Strain: Low Risk   . Difficulty of Paying Living Expenses: Not very hard     Current Diagnosis/Assessment:  Goals Addressed            This Visit's Progress   . Chronic Care Management       CARE PLAN ENTRY (see longitudinal plan of care  for additional care plan information)  Current Barriers:  . Chronic Disease Management support, education, and care coordination needs related to Hypertension, Hyperlipidemia, Asthma, and Osteoporosis   Hypertension BP Readings from Last 3 Encounters:  02/13/20 (!) 220/110  11/08/19 (!) 232/113  07/02/19 (!) 152/92   . Pharmacist Clinical Goal(s): o Over the next 90 days, patient will work with PharmD and providers to achieve BP goal <140/90 . Current regimen:  o Exforge 5/13m 1/2 tab daily (patient report) . Interventions: o Take full dose  Exforge as directed o Restart CoQ10 4053mtwice daily . Patient self care activities - Over the next 90 days, patient will: o Check BP daily, document, and provide at future appointments o Ensure daily salt intake < 2300 mg/day  Hyperlipidemia Lab Results  Component Value Date/Time   LDLCALC 161 (H) 02/13/2020 11:45 AM   . Pharmacist Clinical Goal(s): o Over the next 90 days, patient will work with PharmD and providers to achieve LDL goal < 100 . Current regimen:  o None . Interventions: o Try lifestyle modifications before starting statin . Patient self care activities - Over the next 90 days, patient will: o Reduce consumption of saturated fat  Asthma . Pharmacist Clinical Goal(s) o Over the next 90 days, patient will work with PharmD and providers to improve breathing . Current regimen:  o Breo Ellipta 200-25 mcg 1 puff daily o Singulair 1032maily . Interventions: o Recommend Intal Cromolyn inhaler 2 puffs as needed . Patient self care activities - Over the next 90 days, patient will: o Avoid asthma triggers whenever possible o Use new inhaler as rescue and report results to PharmD  Osteoporosis . Pharmacist Clinical Goal(s) o Over the next 90 days, patient will work with PharmD and providers to improve bone health . Current regimen:  o Life Extension Ultimate Bone Health twice daily o Vitamin D3 10000 iu twice weekly  . Interventions: o Ensure to take no more than 600m34mlcium at a time . Patient self care activities - Over the next 90 days, patient will: o Split calcium up to twice daily with lower mg o Consider Fosamax to increase bone density  Medication management . Pharmacist Clinical Goal(s): o Over the next 90 days, patient will work with PharmD and providers to maintain optimal medication adherence . [facility] . Interventions o Comprehensive medication review performed. o Continue current medication management strategy . Patient self care  activities - Over the next 90 days, patient will: o Focus on medication adherence by taking blood pressure medications as directed o Report any questions or concerns to PharmD and/or provider(s)  Initial goal documentation       Hypertension   BP goal is:  <140/90  Office blood pressures are  BP Readings from Last 3 Encounters:  02/13/20 (!) 220/110  11/08/19 (!) 232/113  07/02/19 (!) 152/92   Patient checks BP at home infrequently Patient home BP readings are ranging: NA  Patient has failed these meds in the past: HCTZ Patient is currently uncontrolled on the following medications:  . Exforge  We discussed: Occasionally dizzy Taking half dose of Exforge When cuff tightens, BP elevates Not taking CoQ10  Plan  Patient to take full dose of Exforge Patient to restart CoQ10  COPD / Asthma / Tobacco    Eosinophil count:   Lab Results  Component Value Date/Time   EOSPCT 2.3 02/13/2020 11:45 AM  %  Eos (Absolute):  Lab Results  Component Value Date/Time   EOSABS 189 02/13/2020 11:45 AM   EOSABS 0.4 11/05/2015 09:23 AM    Tobacco Status:  Social History   Tobacco Use  Smoking Status Former Smoker  . Packs/day: 0.50  . Years: 25.00  . Pack years: 12.50  . Types: Cigarettes  . Start date: 09/05/1972  . Quit date: 08/03/1998  . Years since quitting: 21.7  Smokeless Tobacco Never Used  Tobacco Comment   smoking cessation materials not required    Patient has failed these meds in past: Trelegy too expensive Patient is currently controlled on the following medications: Breo Ellipta, Singulair Using maintenance inhaler regularly? Yes Frequency of rescue inhaler use:  infrequently  We discussed:   Breo $47 OK Asthma stopped at age 12 Didn't get Combivent Has history of SOB when using drying agents (failed Spiriva) Albuterol makes her nervous, wants more mild inhaler Singulair effective against mucus, mouth dry 60mn  after Failed Mucinex, too dry Cromolyn?  Plan  Recommend Intal cromolyn inhaler 2 puffs as needed for SOB/wheezing  Hyperlipidemia   LDL goal < 70  Lipid Panel     Component Value Date/Time   CHOL 274 (H) 02/13/2020 1145   TRIG 67 02/13/2020 1145   HDL 97 02/13/2020 1145   LDLCALC 161 (H) 02/13/2020 1145    Hepatic Function Latest Ref Rng & Units 02/13/2020 02/11/2019 06/28/2018  Total Protein 6.1 - 8.1 g/dL 7.2 7.6 7.1  Albumin 3.5 - 5.0 g/dL - 4.4 4.3  AST 10 - 35 U/L 19 19 23   ALT 6 - 29 U/L 14 16 16   Alk Phosphatase 38 - 126 U/L - 62 61  Total Bilirubin 0.2 - 1.2 mg/dL 0.7 0.8 0.6     The ASCVD Risk score (GSabina, et al., 2013) failed to calculate for the following reasons:   The valid systolic blood pressure range is 90 to 200 mmHg   Patient has failed these meds in past: NA Patient is currently uncontrolled on the following medications:  . None  We discussed:   Wants to handle lipids "naturally"  Plan  Continue current medications  Osteopenia / Osteoporosis   Last DEXA Scan: 10/21/19   T-Score femoral neck: -3.8  T-Score lumbar spine: -3.9  T-Score forearm radius: -3.6  No results found for: VD25OH   Patient is a candidate for pharmacologic treatment due to T-Score < -2.5 in femoral neck  Patient has failed these meds in past: Fosamax nonadherent Patient is currently uncontrolled on the following medications:  . Life Extension Ultimate Bone Strength . Vitamin D 10000u twice weekly  We discussed:  Life extension Ultimate Bone Strength  Plan  Continue current medications  Medication Management   [facility] Uses pill box? Yes Pt endorses 100% compliance  We discussed:  Takes mostly Life Extension brand OTC products. Pill pak would not help.  Plan  Continue current medication management strategy  Follow up: 3 month phone visit  TMilus Height PharmD, [phone]

## 2020-04-21 NOTE — Patient Instructions (Addendum)
Visit Information  Goals Addressed            This Visit's Progress   . Chronic Care Management       CARE PLAN ENTRY (see longitudinal plan of care for additional care plan information)  Current Barriers:  . Chronic Disease Management support, education, and care coordination needs related to Hypertension, Hyperlipidemia, Asthma, and Osteoporosis   Hypertension BP Readings from Last 3 Encounters:  02/13/20 (!) 220/110  11/08/19 (!) 232/113  07/02/19 (!) 152/92   . Pharmacist Clinical Goal(s): o Over the next 90 days, patient will work with PharmD and providers to achieve BP goal <140/90 . Current regimen:  o Exforge 5/160mg  1/2 tab daily (patient report) . Interventions: o Take full dose Exforge as directed o Restart CoQ10 400mg  twice daily . Patient self care activities - Over the next 90 days, patient will: o Check BP daily, document, and provide at future appointments o Ensure daily salt intake < 2300 mg/day  Hyperlipidemia Lab Results  Component Value Date/Time   LDLCALC 161 (H) 02/13/2020 11:45 AM   . Pharmacist Clinical Goal(s): o Over the next 90 days, patient will work with PharmD and providers to achieve LDL goal < 100 . Current regimen:  o None . Interventions: o Try lifestyle modifications before starting statin . Patient self care activities - Over the next 90 days, patient will: o Reduce consumption of saturated fat  Asthma . Pharmacist Clinical Goal(s) o Over the next 90 days, patient will work with PharmD and providers to improve breathing . Current regimen:  o Breo Ellipta 200-25 mcg 1 puff daily o Singulair 10mg  daily . Interventions: o Recommend Intal Cromolyn inhaler 2 puffs as needed . Patient self care activities - Over the next 90 days, patient will: o Avoid asthma triggers whenever possible o Use new inhaler as rescue and report results to PharmD  Osteoporosis . Pharmacist Clinical Goal(s) o Over the next 90 days, patient will work  with PharmD and providers to improve bone health . Current regimen:  o Life Extension Ultimate Bone Health twice daily o Vitamin D3 10000 iu twice weekly  . Interventions: o Ensure to take no more than 600mg  calcium at a time . Patient self care activities - Over the next 90 days, patient will: o Split calcium up to twice daily with lower mg o Consider Fosamax to increase bone density  Medication management . Pharmacist Clinical Goal(s): o Over the next 90 days, patient will work with PharmD and providers to maintain optimal medication adherence . Current pharmacy: [facility] . Interventions o Comprehensive medication review performed. o Continue current medication management strategy . Patient self care activities - Over the next 90 days, patient will: o Focus on medication adherence by taking blood pressure medications as directed o Report any questions or concerns to PharmD and/or provider(s)  Initial goal documentation        Patricia Galloway was given information about Chronic Care Management services today including:  1. CCM service includes personalized support from designated clinical staff supervised by her physician, including individualized plan of care and coordination with other care providers 2. 24/7 contact phone numbers for assistance for urgent and routine care needs. 3. Standard insurance, coinsurance, copays and deductibles apply for chronic care management only during months in which we provide at least 20 minutes of these services. Most insurances cover these services at 100%, however patients may be responsible for any copay, coinsurance and/or deductible if applicable. This service may help you  avoid the need for more expensive face-to-face services. 4. Only one practitioner may furnish and bill the service in a calendar month. 5. The patient may stop CCM services at any time (effective at the end of the month) by phone call to the office staff.  Patient agreed to services  and verbal consent obtained.   Print copy of patient instructions provided.  Telephone follow up appointment with pharmacy team member scheduled for: 3 months  Milus Height, PharmD, [place], [facility] [phone]  Asthma Attack Prevention, Adult Although you may not be able to control the fact that you have asthma, you can take actions to prevent episodes of asthma (asthma attacks). These actions include:  Creating a written plan for managing and treating your asthma attacks (asthma action plan).  Monitoring your asthma.  Avoiding things that can irritate your airways or make your asthma symptoms worse (asthma triggers).  Taking your medicines as directed.  Acting quickly if you have signs or symptoms of an asthma attack. What are some ways to prevent an asthma attack? Create a plan Work with your health care provider to create an asthma action plan. This plan should include:  A list of your asthma triggers and how to avoid them.  A list of symptoms that you experience during an asthma attack.  Information about when to take medicine and how much medicine to take.  Information to help you understand your peak flow measurements.  Contact information for your health care providers.  Daily actions that you can take to control asthma. Monitor your asthma To monitor your asthma:  Use your peak flow meter every morning and every evening for 2-3 weeks. Record the results in a journal. A drop in your peak flow numbers on one or more days may mean that you are starting to have an asthma attack, even if you are not having symptoms.  When you have asthma symptoms, write them down in a journal.  Avoid asthma triggers Work with your health care provider to find out what your asthma triggers are. This can be done by:  Being tested for allergies.  Keeping a journal that notes when asthma attacks occur and what may have contributed to  them.  Asking your health care provider whether other medical conditions make your asthma worse. Common asthma triggers include:  Dust.  Smoke. This includes campfire smoke and secondhand smoke from tobacco products.  Pet dander.  Trees, grasses or pollens.  Very cold, dry, or humid air.  Mold.  Foods that contain high amounts of sulfites.  Strong smells.  Engine exhaust and air pollution.  Aerosol sprays and fumes from household cleaners.  Household pests and their droppings, including dust mites and cockroaches.  Certain medicines, including NSAIDs. Once you have determined your asthma triggers, take steps to avoid them. Depending on your triggers, you may be able to reduce the chance of an asthma attack by:  Keeping your home clean. Have someone dust and vacuum your home for you 1 or 2 times a week. If possible, have them use a high-efficiency particulate arrestance (HEPA) vacuum.  Washing your sheets weekly in hot water.  Using allergy-proof mattress covers and casings on your bed.  Keeping pets out of your home.  Taking care of mold and water problems in your home.  Avoiding areas where people smoke.  Avoiding using strong perfumes or odor sprays.  Avoid spending a lot of time outdoors when pollen counts are high and on very  windy days.  Talking with your health care provider before stopping or starting any new medicines. Medicines Take over-the-counter and prescription medicines only as told by your health care provider. Many asthma attacks can be prevented by carefully following your medicine schedule. Taking your medicines correctly is especially important when you cannot avoid certain asthma triggers. Even if you are doing well, do not stop taking your medicine and do not take less medicine. Act quickly If an asthma attack happens, acting quickly can decrease how severe it is and how long it lasts. Take these actions:  Pay attention to your symptoms. If  you are coughing, wheezing, or having difficulty breathing, do not wait to see if your symptoms go away on their own. Follow your asthma action plan.  If you have followed your asthma action plan and your symptoms are not improving, call your health care provider or seek immediate medical care at the nearest hospital. It is important to write down how often you need to use your fast-acting rescue inhaler. You can track how often you use an inhaler in your journal. If you are using your rescue inhaler more often, it may mean that your asthma is not under control. Adjusting your asthma treatment plan may help you to prevent future asthma attacks and help you to gain better control of your condition. How can I prevent an asthma attack when I exercise? Exercise is a common asthma trigger. To prevent asthma attacks during exercise:  Follow advice from your health care provider about whether you should use your fast-acting inhaler before exercising. Many people with asthma experience exercise-induced bronchoconstriction (EIB). This condition often worsens during vigorous exercise in cold, humid, or dry environments. Usually, people with EIB can stay very active by using a fast-acting inhaler before exercising.  Avoid exercising outdoors in very cold or humid weather.  Avoid exercising outdoors when pollen counts are high.  Warm up and cool down when exercising.  Stop exercising right away if asthma symptoms start. Consider taking part in exercises that are less likely to cause asthma symptoms such as:  Indoor swimming.  Biking.  Walking.  Hiking.  Playing football. This information is not intended to replace advice given to you by your health care provider. Make sure you discuss any questions you have with your health care provider. Document Revised: 08/04/2017 Document Reviewed: 02/06/2016 Elsevier Patient Education  2020 [employer].

## 2020-04-22 ENCOUNTER — Other Ambulatory Visit: Payer: Self-pay | Admitting: Family Medicine

## 2020-04-22 MED ORDER — LEVALBUTEROL TARTRATE 45 MCG/ACT IN AERO: 2.0000 | INHALATION_SPRAY | RESPIRATORY_TRACT | 0 refills | Status: DC | PRN

## 2020-04-29 ENCOUNTER — Ambulatory Visit: Payer: Self-pay

## 2020-04-29 DIAGNOSIS — J4540 Moderate persistent asthma, uncomplicated: Secondary | ICD-10-CM

## 2020-04-29 DIAGNOSIS — I10 Essential (primary) hypertension: Secondary | ICD-10-CM | POA: Diagnosis not present

## 2020-04-29 NOTE — Chronic Care Management (AMB) (Signed)
Chronic Care Management   Follow Up Note   04/29/2020 Name: Patricia Galloway MRN: [id number] DOB: [birth year]  Primary Care Provider: [name], MD Reason for referral : Chronic Care Management   Patricia Galloway is a 69 y.o. year old female who is a primary care patient of [name], MD. She is currently engaged with the chronic care management team. A routine telephonic outreach was conducted today.   Review of Patricia Galloway status, including review of consultants reports, relevant laboratory and other test results, and collaboration with appropriate care team members and the patient's provider was performed as part of comprehensive patient evaluation and provision of chronic care management services.    SDOH (Social Determinants of Health) assessments performed: No  Outpatient Encounter Medications as of 04/29/2020  Medication Sig Note  . acidophilus (RISAQUAD) CAPS capsule Take 3 capsules by mouth at bedtime. (Patient not taking: Reported on 04/16/2020)   . albuterol (VENTOLIN HFA) 108 (90 Base) MCG/ACT inhaler TAKE 2 PUFFS BY MOUTH EVERY 6 HOURS AS NEEDED FOR WHEEZE OR SHORTNESS OF BREATH   . amLODipine-valsartan (EXFORGE) 5-160 MG tablet Take 1 tablet by mouth daily. (Patient taking differently: Take 0.5 tablets by mouth daily. )   . calcium carbonate (OS-CAL - DOSED IN MG OF ELEMENTAL CALCIUM) 1250 (500 Ca) MG tablet Take by mouth.   . cholecalciferol (VITAMIN D) 1000 units tablet Take 10,000 Units by mouth 2 (two) times a week.  09/11/2018: Takes 2000iu every morning  . Cholecalciferol (VITAMIN D3) 250 MCG (10000 UT) TABS Take 1 tablet by mouth 2 (two) times a week.   . Coenzyme Q10 (COQ10) 400 MG CAPS Take 400 mg by mouth 2 (two) times daily.  (Patient not taking: Reported on 04/16/2020)   . diazepam (VALIUM) 5 MG tablet Take 1 tablet (5 mg total) by mouth daily as needed for anxiety. 30 minutes before doctor's visit   . Digestive Enzyme CAPS Take 1 capsule by mouth 3 (three)  times daily after meals.  (Patient not taking: Reported on 04/16/2020)   . ELDERBERRY PO Take by mouth. Every Winter   . fluticasone furoate-vilanterol (BREO ELLIPTA) 200-25 MCG/INH AEPB Inhale 1 puff into the lungs daily.   . Ginkgo Biloba Extract (GNP GINGKO BILOBA EXTRACT) 60 MG CAPS Take 60 mg by mouth as needed.  09/11/2018: Only with radiation exposure  . Ipratropium-Albuterol (COMBIVENT) 20-100 MCG/ACT AERS respimat Inhale 1 puff into the lungs every 6 (six) hours. (Patient not taking: Reported on 04/16/2020)   . levalbuterol (XOPENEX HFA) 45 MCG/ACT inhaler Inhale 2 puffs into the lungs every 4 (four) hours as needed for wheezing.   . magnesium 30 MG tablet Take 60 mg by mouth daily.    . montelukast (SINGULAIR) 10 MG tablet Take 1 tablet (10 mg total) by mouth at bedtime.   . Multiple Vitamin (MULTIVITAMIN) capsule Take 2 capsules by mouth daily.    Marland Kitchen OVER THE COUNTER MEDICATION Take 1 tablet by mouth 2 (two) times daily. Nutri-Calm 09/11/2018: Only with radiation  . Spirulina 500 MG TABS Take 500 mg by mouth daily.  09/11/2018: Only with radiation  . UNABLE TO FIND Med Name: Alinda Dooms  Only with radiation   . UNABLE TO FIND Med Name: liver care (Patient not taking: Reported on 04/16/2020)    No facility-administered encounter medications on file as of 04/29/2020.       Goals Addressed            This Visit's Progress   . Chronic Disease  Management       CARE PLAN ENTRY (see longitudinal plan of care for additional care plan information)  Current Barriers:  . Chronic Disease Management support and education needs related to Dyslipidemia, Emphysema, and White Coat Syndrome with HTN. (Hx of Basal Cell Cancer)  Case Manager Clinical Goal(s):  Over the next 120 days, patient will: . Take all medications as prescribed. . Attend all medical appointments as scheduled. . Monitor blood pressure and record readings. . Follow treatment recommendations to prevent emphysema/COPD  exacerbations. . Follow recommended safety measures to prevent falls and injuries. Over the next 45 days, patient will: . Outreach with CCM Pharmacist.-Complete  Interventions:  . Inter-disciplinary care team collaboration (see longitudinal plan of care) . Discussed medications and current treatment plan. Completed outreach with the CCM Pharmacist to discuss inhaler options. Denies shortness of breath or exacerbations. Oxygen saturations remain in the high 90's. Continues to take medications as prescribed. Reviewed home BP readings and discussed indications for contacting her provider. She is aware that clinical BP readings are significantly higher d/t her hx of White Coat Syndrome. She does not monitor her BP daily at home but attempts to monitor routinely. Reports recent reading of 134/88. Denies any concerning symptoms over the past few weeks. No changes or decline in activity tolerance.  . Reviewed scheduled/upcoming appointments. No concerns regarding transportation. She is scheduled for a PCP visit on 05/19/20. Marland Kitchen Discussed plans for ongoing care management. 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 Denies current need for additional assistance but agreed to update the team if needs change. She is doing well and remains very motivated to improve her overall health. No urgent concerns or changes in care management needs. The care management team will outreach again in October.   Patient Self Care Activities:  . Self administers medications . Attends scheduled provider appointments . Calls pharmacy for medication refills . Performs ADL's independently . Performs IADL's independently . Calls provider office for new concerns or questions   Please see past updates related to this goal by clicking on the "Past Updates" button in the selected goal            PLAN A member of the care management team will follow up with Patricia Galloway in October.    Cristy [facility]lander [facility] Care Management [facility] [phone]

## 2020-05-06 NOTE — Patient Instructions (Addendum)
  Thank you for allowing the Chronic Care Management team to participate in your care.  Goals Addressed            This Visit's Progress   . Chronic Disease Management       CARE PLAN ENTRY (see longitudinal plan of care for additional care plan information)  Current Barriers:  . Chronic Disease Management support and education needs related to Dyslipidemia, Emphysema, and White Coat Syndrome with HTN. (Hx of Basal Cell Cancer)  Case Manager Clinical Goal(s):  Over the next 120 days, patient will: . Take all medications as prescribed. . Attend all medical appointments as scheduled. . Monitor blood pressure and record readings. . Follow treatment recommendations to prevent emphysema/COPD exacerbations. . Follow recommended safety measures to prevent falls and injuries. Over the next 45 days, patient will: . Outreach with CCM Pharmacist.-Complete  Interventions:  . Inter-disciplinary care team collaboration (see longitudinal plan of care) . Discussed medications and current treatment plan. Completed outreach with the CCM Pharmacist to discuss inhaler options. Denies shortness of breath or exacerbations. Oxygen saturations remain in the high 90's. Continues to take medications as prescribed. Reviewed home BP readings and discussed indications for contacting her provider. She is aware that clinical BP readings are significantly higher d/t her hx of White Coat Syndrome. She does not monitor her BP daily at home but attempts to monitor routinely. Reports recent reading of 134/88. Denies any concerning symptoms over the past few weeks. No changes or decline in activity tolerance.  . Reviewed scheduled/upcoming appointments. No concerns regarding transportation. She is scheduled for a PCP visit on 05/19/20. Marland Kitchen Discussed plans for ongoing care management. 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 Denies current need for additional assistance but agreed to update the team if  needs change. She is doing well and remains very motivated to improve her overall health. No urgent concerns or changes in care management needs. The care management team will outreach again in October.   Patient Self Care Activities:  . Self administers medications . Attends scheduled provider appointments . Calls pharmacy for medication refills . Performs ADL's independently . Performs IADL's independently . Calls provider office for new concerns or questions   Please see past updates related to this goal by clicking on the "Past Updates" button in the selected goal         [name] verbalized understanding of the information discussed during the telephonic outreach today. Declined need for a mailed/printed copy of the instructions.   A member of the care management team will follow up with [name] in October.    Patricia Galloway [facility] Care Management [facility] [phone]

## 2020-05-07 ENCOUNTER — Other Ambulatory Visit: Payer: Self-pay | Admitting: Family Medicine

## 2020-05-07 DIAGNOSIS — I10 Essential (primary) hypertension: Secondary | ICD-10-CM

## 2020-05-18 NOTE — Progress Notes (Signed)
Name: Patricia Galloway   MRN: [id number]    DOB: November 14, 1950   Date:05/19/2020       Progress Note  Subjective  Chief Complaint  Chief Complaint  Patient presents with  . Asthma  . COPD  . Hypertension  . Hyperglycemia  . Depression  . Anxiety    HPI  Basal cell carcinoma right leg:she is doing well, s/p removal and chemotherapy.Her gait is back to normal , full rom of motion. Under the care of Dr. Grayland Ormond , recent PET scan negative   Lung nodules: monitored by Dr. Grayland Ormond , one new nodule on PET scan done 11/06/2019 and advised to follow up CT lung 6-12 months, it was already ordered for next March.   Hyperlipidemia:she refuses statin therapy   HTN with white coat: shestarted HCTZ Feb 2020, however she feels tired daily since she started medication, discussed 24 hours bp monitor and referral to cardiologist and pulmonologist, however she states even checking her bp at home it makes her nervous, and bp spikes, she has been taking Valium prior to doctor's visits but does not seem to decrease bp reading. BP today was higher than usual for her, she has not been taking HCTZ because is causing urinary frequency, discussed importance of bp control, last visit back in June we started her to Exforge 5/160 mg , she has been very compliant but stayed on half dose because she has noticed mild episode of dizziness 4 hours after taking medication. It last about 15 seconds and resolves , she is usually in her kitchen. She checked bp twice since started on this medication, but only once it was at goal at 134/88 on 04/28/2020   Major Depression in remission:she is still doing well, phq 9 is back to normal, however she was tearful during her visit. She has been taking Valerian Root . Her husband has cancer, she has been the caregiver, she has been doing all the house chores and is feeling very tired, but appreciates when he is able to do some dishes or start his own laundry  Also has difficulty  sleeping at night. Denies any recent panic attacks but seems to still feel anxious. We discussed last time for her to take time for herself, but she has been unable to do it as suggested.  Asthma/COPD:she was switched to Trelegy from [place]. She is concerned about cost, she also states Trelegy did not help as much, so she asked to go back on Breo without Incruse on her last visit. She states she has daily cough with white sputum, also has SOB with activity and wheezing daily She uses albuterol and it helps her within 5 minutes. She does not want to go back to Trelegy ( it caused her to feel worse )  but would like to continue Breo and Combivent instead since she used Ipratropium many years ago and it worked well for her. She states unable to afford Breo at this time because of out of pocket expense is too high   Atherosclerosis aorta: refuses statin therapyalso not taking aspirin. Reviewed PET scan from 11/06/2019 Unchanged   Osteoporosis: discussed options, shewas seen by Dr Gabriel Carina 03/2018 and was given Alendronate but she states never started medication, she states she has a high calcium diet and some otc supplementations. She is afraid of side effects , does not like adding medications  Chronic Pancreatitis: found on CT abdomen and also on PET scan done on 11/06/2019 , denies nausea, vomiting or indigestion. No  abdominal pan at this time   Aneurysm of ascending aorta: found on CT 2019, but not on CT 2020 and 2021, discussed CTA or MRA, but she is not interested in having it done   Patient Active Problem List   Diagnosis Date Noted  . Pain in limb 03/29/2017  . Basal cell carcinoma 03/29/2017  . Thoracic aortic aneurysm without rupture ([place]) 10/31/2016  . Atherosclerosis of aorta ([place]) 10/31/2016  . Chronic pancreatitis ([place]) 10/31/2016  . Pulmonary nodule 10/31/2016  . Centrilobular emphysema ([place]) 10/31/2016  . Iron deficiency anemia due to chronic blood loss 10/24/2016  .  Basal cell carcinoma, leg, right 08/24/2016  . Hyperglycemia 08/01/2016  . Chronic thoracic back pain 08/04/2015  . Panic attack 08/04/2015  . White coat syndrome without diagnosis of hypertension 08/04/2015  . Major depression, recurrent ([place]) 08/04/2015  . Asthma, moderate persistent, poorly-controlled 08/04/2015    Past Surgical History:  Procedure Laterality Date  . BASAL CELL CARCINOMA EXCISION Right 03/29/2017   Procedure: EXCISION OF RIGHT LEG BASEL CELL CARCINOMA;  Surgeon: [name], DO;  Location: [facility];  Service: Plastics;  Laterality: Right;  . COLON SURGERY  2002   colostomy for 10 months after a perfurated sigmoid   . COLOSTOMY REVERSAL  2003  . EMBOLIZATION Right 10/12/2016   Procedure: Embolization;  Surgeon: [name], MD;  Location: [facility];  Service: Cardiovascular;  Laterality: Right;  . etopic  [id number]  . INCISION AND DRAINAGE OF WOUND Right 03/30/2017   Procedure: IRRIGATION AND DEBRIDEMENT WOUND; VAC PLACEMENT;  Surgeon: [name], DO;  Location: [facility];  Service: Plastics;  Laterality: Right;  . TONSILLECTOMY      Family History  Problem Relation Age of Onset  . Dementia Mother   . Aortic stenosis Mother   . Osteoporosis Mother   . Dementia Father   . Diabetes Father   . Hyperlipidemia Father   . Hypertension Father   . Aneurysm Sister   . Hyperlipidemia Sister   . Hypertension Sister   . Diabetes Sister   . Breast cancer Neg Hx     Social History   Tobacco Use  . Smoking status: Former Smoker    Packs/day: 0.50    Years: 25.00    Pack years: 12.50    Types: Cigarettes    Start date: 09/05/1972    Quit date: 08/03/1998    Years since quitting: 21.8  . Smokeless tobacco: Never Used  . Tobacco comment: smoking cessation materials not required  Substance Use Topics  . Alcohol use: No    Alcohol/week: 0.0 standard drinks     Current Outpatient Medications:  .  acidophilus (RISAQUAD) CAPS capsule,  Take 3 capsules by mouth at bedtime. , Disp: , Rfl:  .  albuterol (VENTOLIN HFA) 108 (90 Base) MCG/ACT inhaler, TAKE 2 PUFFS BY MOUTH EVERY 6 HOURS AS NEEDED FOR WHEEZE OR SHORTNESS OF BREATH, Disp: 18 g, Rfl: 0 .  amLODipine-valsartan (EXFORGE) 5-160 MG tablet, TAKE 1 TABLET BY MOUTH EVERY DAY, Disp: 90 tablet, Rfl: 0 .  Cholecalciferol (VITAMIN D3) 250 MCG (10000 UT) TABS, Take 1 tablet by mouth 2 (two) times a week., Disp: , Rfl:  .  Coenzyme Q10 (COQ10) 400 MG CAPS, Take 400 mg by mouth 2 (two) times daily. , Disp: , Rfl:  .  diazepam (VALIUM) 5 MG tablet, Take 1 tablet (5 mg total) by mouth daily as needed for anxiety. 30 minutes before doctor's visit, Disp: 30 tablet,  Rfl: 0 .  Digestive Enzyme CAPS, Take 1 capsule by mouth 3 (three) times daily after meals. , Disp: , Rfl:  .  ELDERBERRY PO, Take by mouth. Every Winter, Disp: , Rfl:  .  fluticasone furoate-vilanterol (BREO ELLIPTA) 200-25 MCG/INH AEPB, Inhale 1 puff into the lungs daily., Disp: 60 each, Rfl: 5 .  Ginkgo Biloba Extract (GNP GINGKO BILOBA EXTRACT) 60 MG CAPS, Take 60 mg by mouth as needed. , Disp: , Rfl:  .  levalbuterol (XOPENEX HFA) 45 MCG/ACT inhaler, Inhale 2 puffs into the lungs every 4 (four) hours as needed for wheezing., Disp: 1 each, Rfl: 0 .  montelukast (SINGULAIR) 10 MG tablet, Take 1 tablet (10 mg total) by mouth at bedtime., Disp: 90 tablet, Rfl: 1 .  Multiple Vitamin (MULTIVITAMIN) capsule, Take 2 capsules by mouth daily. , Disp: , Rfl:  .  OVER THE COUNTER MEDICATION, Take 1 tablet by mouth 2 (two) times daily. Nutri-Calm, Disp: , Rfl:  .  cholecalciferol (VITAMIN D) 1000 units tablet, Take 10,000 Units by mouth 2 (two) times a week.  (Patient not taking: Reported on 05/19/2020), Disp: , Rfl:   Allergies  Allergen Reactions  . Penicillins Anaphylaxis    Has patient had a PCN reaction causing immediate rash, facial/tongue/throat swelling, SOB or lightheadedness with hypotension: Yes Has patient had a PCN  reaction causing severe rash involving mucus membranes or skin necrosis: No Has patient had a PCN reaction that required hospitalization: No Has patient had a PCN reaction occurring within the last 10 years: No If all of the above answers are "NO", then may proceed with Cephalosporin use.   . Codeine Nausea And Vomiting  . Latex Itching  . Sulfa Antibiotics Other (See Comments)    TOPICAL-SULFA CAUSED BLISTERS  . Tape Other (See Comments)    Reaction:  Blisters and burning  Pt states that paper tape is okay.    [address] [facility]] Anxiety    I personally reviewed active problem list, medication list, allergies, family history, social history, health maintenance with the patient/caregiver today.   ROS  Constitutional: Negative for fever or weight change.  Respiratory: Negative for cough and shortness of breath.   Cardiovascular: Negative for chest pain or palpitations.  Gastrointestinal: Negative for abdominal pain, no bowel changes.  Musculoskeletal: Negative for gait problem or joint swelling.  Skin: Negative for rash.  Neurological: positive for intermittent dizziness but no  headache.  No other specific complaints in a complete review of systems (except as listed in HPI above).  Objective  Vitals:   05/19/20 1013  BP: (!) 210/120  Pulse: 97  Resp: 16  Temp: 98 F (36.7 C)  TempSrc: Oral  SpO2: 97%  Weight: 116 lb 9.6 oz (52.9 kg)  Height: 5\' 3"  (1.6 m)    Body mass index is 20.65 kg/m.  Physical Exam  Constitutional: Patient appears well-developed and well-nourished.  No distress.  HEENT: head atraumatic, normocephalic, pupils equal and reactive to light,neck supple Cardiovascular: Normal rate, regular rhythm and normal heart sounds.  No murmur heard. No BLE edema. Pulmonary/Chest: Effort normal and breath sounds normal. No respiratory distress. Abdominal: Soft.  There is no tenderness. Psychiatric: Patient has a normal mood and affect. behavior is  normal. Judgment and thought content normal.  PHQ2/9: Depression screen [facility] 2/9 04/03/2020 02/13/2020 07/02/2019 03/05/2019 11/16/2018  Decreased Interest 0 0 0 0 0  Down, Depressed, Hopeless 0 0 0 0 0  PHQ - 2 Score 0 0 0 0 0  Altered sleeping - 1 - 0 1  Tired, decreased energy - 1 - 2 1  Change in appetite - 0 - 0 0  Feeling bad or failure about yourself  - 0 - 0 0  Trouble concentrating - 0 - 0 0  Moving slowly or fidgety/restless - 0 - 0 0  Suicidal thoughts - 0 - 0 0  PHQ-9 Score - 2 - 2 2  Difficult doing work/chores - Not difficult at all - Not difficult at all Not difficult at all    phq 9 is negative   Fall Risk: Fall Risk  05/19/2020 02/13/2020 07/02/2019 03/05/2019 11/16/2018  Falls in the past year? 0 0 0 0 0  Comment - - - - -  Number falls in past yr: 0 0 0 0 0  Injury with Fall? 0 0 0 0 0  Follow up - - Falls prevention discussed - -     Assessment & Plan  1. Thoracic aortic aneurysm without rupture ([place])  She does not want to recheck study  2. Asthma, moderate persistent, poorly-controlled  - montelukast (SINGULAIR) 10 MG tablet; Take 1 tablet (10 mg total) by mouth at bedtime.  Dispense: 90 tablet; Refill: 1  3. White coat syndrome with diagnosis of hypertension  She will try to get a wrist monitor, also advised to try taking half pill twice daily and if tolerated can take entire pill at night, return sooner for bp check only   4. Dyslipidemia   5. Centrilobular emphysema ([place])   6. History of basal cell carcinoma (BCC) excision  Keep follow up with hematologist   7. Age-related osteoporosis without current pathological fracture  Refuses therapy   8. Perennial allergic rhinitis with seasonal variation   9. COPD with asthma ([place])  - montelukast (SINGULAIR) 10 MG tablet; Take 1 tablet (10 mg total) by mouth at bedtime.  Dispense: 90 tablet; Refill: 1  10. Chronic pancreatitis, unspecified pancreatitis type ([place])

## 2020-05-19 ENCOUNTER — Encounter: Payer: Self-pay | Admitting: Family Medicine

## 2020-05-19 ENCOUNTER — Other Ambulatory Visit: Payer: Self-pay

## 2020-05-19 ENCOUNTER — Ambulatory Visit (INDEPENDENT_AMBULATORY_CARE_PROVIDER_SITE_OTHER): Payer: Medicare HMO | Admitting: Family Medicine

## 2020-05-19 VITALS — BP 210/120 | HR 97 | Temp 98.0°F | Resp 16 | Ht 63.0 in | Wt 116.6 lb

## 2020-05-19 DIAGNOSIS — J432 Centrilobular emphysema: Secondary | ICD-10-CM | POA: Diagnosis not present

## 2020-05-19 DIAGNOSIS — Z85828 Personal history of other malignant neoplasm of skin: Secondary | ICD-10-CM

## 2020-05-19 DIAGNOSIS — I712 Thoracic aortic aneurysm, without rupture, unspecified: Secondary | ICD-10-CM

## 2020-05-19 DIAGNOSIS — K861 Other chronic pancreatitis: Secondary | ICD-10-CM

## 2020-05-19 DIAGNOSIS — Z9889 Other specified postprocedural states: Secondary | ICD-10-CM | POA: Diagnosis not present

## 2020-05-19 DIAGNOSIS — M810 Age-related osteoporosis without current pathological fracture: Secondary | ICD-10-CM | POA: Diagnosis not present

## 2020-05-19 DIAGNOSIS — J449 Chronic obstructive pulmonary disease, unspecified: Secondary | ICD-10-CM | POA: Diagnosis not present

## 2020-05-19 DIAGNOSIS — E785 Hyperlipidemia, unspecified: Secondary | ICD-10-CM | POA: Diagnosis not present

## 2020-05-19 DIAGNOSIS — J4489 Other specified chronic obstructive pulmonary disease: Secondary | ICD-10-CM

## 2020-05-19 DIAGNOSIS — J4540 Moderate persistent asthma, uncomplicated: Secondary | ICD-10-CM | POA: Diagnosis not present

## 2020-05-19 DIAGNOSIS — J3089 Other allergic rhinitis: Secondary | ICD-10-CM

## 2020-05-19 DIAGNOSIS — I10 Essential (primary) hypertension: Secondary | ICD-10-CM | POA: Diagnosis not present

## 2020-05-19 DIAGNOSIS — J302 Other seasonal allergic rhinitis: Secondary | ICD-10-CM

## 2020-05-19 MED ORDER — MONTELUKAST SODIUM 10 MG PO TABS: 10.0000 mg | ORAL_TABLET | Freq: Every day | ORAL | 1 refills | Status: DC

## 2020-06-29 ENCOUNTER — Telehealth: Payer: Self-pay

## 2020-07-07 ENCOUNTER — Ambulatory Visit: Payer: Medicare HMO

## 2020-07-21 ENCOUNTER — Ambulatory Visit: Payer: Self-pay

## 2020-07-21 DIAGNOSIS — E785 Hyperlipidemia, unspecified: Secondary | ICD-10-CM

## 2020-07-21 DIAGNOSIS — I10 Essential (primary) hypertension: Secondary | ICD-10-CM

## 2020-07-21 NOTE — Chronic Care Management (AMB) (Signed)
Chronic Care Management Pharmacy  Name: Patricia Galloway  MRN: [id number] DOB: Feb 13, 1951  Chief Complaint/ HPI  Patricia Galloway,  69 y.o. , female presents for their Follow-Up CCM visit with the clinical pharmacist via telephone.  PCP : [name], MD  Their chronic conditions include: Hypertension, Hyperlipidemia, Asthma, and Osteoporosis  Office Visits: 05/19/20: Patient presented to Dr. Ancil Boozer for follow-up. Calcium, combivent, magnesium, Spirulina, lemon balm, liver care stopped. Patient encouraged to increase Exforge to 1/2 tablet twice daily  6/10 COPD, [place], BP 220/110 P 95 Wt 117 BMI 20.7, R leg cancer, HCTZ non-adherent, start ExForge, tearful, Valerian Root, insomnia, Trelegy too expensive, start Breo Ellipta/Combivent, Fosamax non-adherent, refuses statin/ASA  Consult Visit: 3/8 osteoporosis, [place], BP 186/100 P 78 Wt 124 BMI 21.28, menopause age 14, former smoker,   Medications: Outpatient Encounter Medications as of 07/21/2020  Medication Sig Note  . amLODipine-valsartan (EXFORGE) 5-160 MG tablet TAKE 1 TABLET BY MOUTH EVERY DAY (Patient taking differently: Take 0.5 tablets by mouth 2 (two) times daily. )   . fluticasone furoate-vilanterol (BREO ELLIPTA) 200-25 MCG/INH AEPB Inhale 1 puff into the lungs daily.   Marland Kitchen acidophilus (RISAQUAD) CAPS capsule Take 3 capsules by mouth at bedtime.    Marland Kitchen albuterol (VENTOLIN HFA) 108 (90 Base) MCG/ACT inhaler TAKE 2 PUFFS BY MOUTH EVERY 6 HOURS AS NEEDED FOR WHEEZE OR SHORTNESS OF BREATH   . cholecalciferol (VITAMIN D) 1000 units tablet Take 10,000 Units by mouth 2 (two) times a week.  (Patient not taking: Reported on 05/19/2020) 09/11/2018: Takes 2000iu every morning  . Cholecalciferol (VITAMIN D3) 250 MCG (10000 UT) TABS Take 1 tablet by mouth 2 (two) times a week.   . Coenzyme Q10 (COQ10) 400 MG CAPS Take 400 mg by mouth 2 (two) times daily.    . diazepam (VALIUM) 5 MG tablet Take 1 tablet (5 mg total) by mouth daily as needed for  anxiety. 30 minutes before doctor's visit   . Digestive Enzyme CAPS Take 1 capsule by mouth 3 (three) times daily after meals.    Marland Kitchen ELDERBERRY PO Take by mouth. Every Winter   . Ginkgo Biloba Extract (GNP GINGKO BILOBA EXTRACT) 60 MG CAPS Take 60 mg by mouth as needed.  09/11/2018: Only with radiation exposure  . levalbuterol (XOPENEX HFA) 45 MCG/ACT inhaler Inhale 2 puffs into the lungs every 4 (four) hours as needed for wheezing.   . montelukast (SINGULAIR) 10 MG tablet Take 1 tablet (10 mg total) by mouth at bedtime.   . Multiple Vitamin (MULTIVITAMIN) capsule Take 2 capsules by mouth daily.    Marland Kitchen OVER THE COUNTER MEDICATION Take 1 tablet by mouth 2 (two) times daily. Nutri-Calm 09/11/2018: Only with radiation   No facility-administered encounter medications on file as of 07/21/2020.   Current Diagnosis/Assessment:  SDOH Interventions     Most Recent Value  SDOH Interventions  Financial Strain Interventions Intervention Not Indicated  Transportation Interventions Intervention Not Indicated     Goals Addressed            This Visit's Progress   . Chronic Care Management       CARE PLAN ENTRY (see longitudinal plan of care for additional care plan information)  Current Barriers:  . Chronic Disease Management support, education, and care coordination needs related to Hypertension, Hyperlipidemia, Asthma, and Osteoporosis   Hypertension BP Readings from Last 3 Encounters:  02/13/20 (!) 220/110  11/08/19 (!) 232/113  07/02/19 (!) 152/92   . Pharmacist Clinical Goal(s): o Over the next 90  days, patient will work with PharmD and providers to achieve BP goal <140/90 . Current regimen:  o Amlodipine-Valsartan 5/149m 1/2 tablet twice daily  . Patient self care activities - Over the next 90 days, patient will: o Check Blood Pressure 3-4 times weekly daily, document, and provide at future appointments o Ensure daily salt intake < 2300 mg/day  Hyperlipidemia Lab Results  Component  Value Date/Time   LDLCALC 161 (H) 02/13/2020 11:45 AM   . Pharmacist Clinical Goal(s): o Over the next 90 days, patient will work with PharmD and providers to achieve LDL goal < 100 . Current regimen:  o None . Interventions: o Try lifestyle modifications before starting statin . Patient self care activities - Over the next 90 days, patient will: o Reduce consumption of saturated fat  Asthma . Pharmacist Clinical Goal(s) o Over the next 90 days, patient will work with PharmD and providers to improve breathing . Current regimen:  o Breo Ellipta 200-25 mcg 1 puff daily o Singulair 162mdaily . Interventions: o Recommend Intal Cromolyn inhaler 2 puffs as needed . Patient self care activities - Over the next 90 days, patient will: o Avoid asthma triggers whenever possible o Use new inhaler as rescue and report results to PharmD  Osteoporosis . Pharmacist Clinical Goal(s) o Over the next 90 days, patient will work with PharmD and providers to improve bone health . Current regimen:  o Life Extension Ultimate Bone Health twice daily o Vitamin D3 10000 iu twice weekly  . Interventions: o Ensure [phone] units of vitamin D daily.  o Ensure 1200 mg of calcium daily from dietary and supplemental sources. . Patient self care activities - Over the next 90 days, patient will: o Consider Fosamax to increase bone density  Medication management . Pharmacist Clinical Goal(s): o Over the next 90 days, patient will work with PharmD and providers to maintain optimal medication adherence . [facility] . Interventions o Comprehensive medication review performed. o Continue current medication management strategy . Patient self care activities - Over the next 90 days, patient will: o Focus on medication adherence by taking blood pressure medications as directed o Report any questions or concerns to PharmD and/or provider(s)  Initial goal documentation       Hypertension   BP  goal is:  <140/90  Office blood pressures are  BP Readings from Last 3 Encounters:  05/19/20 (!) 210/120  02/13/20 (!) 220/110  11/08/19 (!) 232/113   Patient checks BP at home infrequently Patient home BP readings are ranging: NA  Patient has failed these meds in the past: HCTZ Patient is currently uncontrolled on the following medications:  . Amlodipine- Valsartan 5-160 mg daily (taking 1/2 tablet daily)   We discussed: Patient Increased to 1/2 tablet twice daily on 07/18/20. So far patient has not noticed any worsening dizziness.    Plan  Continue current medications  Recommend wrist monitor as it will likely be more accurate for estimating blood pressure given the severity of her white coat response when checking blood pressure at the office or at home  Asthma   Eosinophil count:   Lab Results  Component Value Date/Time   EOSPCT 2.3 02/13/2020 11:45 AM  %                               Eos (Absolute):  Lab Results  Component Value Date/Time   EOSABS 189 02/13/2020 11:45 AM   EOSABS  0.4 11/05/2015 09:23 AM    Tobacco Status:  Social History   Tobacco Use  Smoking Status Former Smoker  . Packs/day: 0.50  . Years: 25.00  . Pack years: 12.50  . Types: Cigarettes  . Start date: 09/05/1972  . Quit date: 08/03/1998  . Years since quitting: 21.9  Smokeless Tobacco Never Used  Tobacco Comment   smoking cessation materials not required    Patient has failed these meds in past: Trelegy too expensive Patient is currently controlled on the following medications:   Breo Ellipta 1 puff daily   Montelukast 10 mg nightly  Using maintenance inhaler regularly? Yes Frequency of rescue inhaler use:  infrequently  We discussed:  Breathing symptoms stable at this time.   Plan  Continue current medications  Hyperlipidemia   LDL goal < 70  Lipid Panel     Component Value Date/Time   CHOL 274 (H) 02/13/2020 1145   TRIG 67 02/13/2020 1145   HDL 97 02/13/2020 1145    LDLCALC 161 (H) 02/13/2020 1145    Hepatic Function Latest Ref Rng & Units 02/13/2020 02/11/2019 06/28/2018  Total Protein 6.1 - 8.1 g/dL 7.2 7.6 7.1  Albumin 3.5 - 5.0 g/dL - 4.4 4.3  AST 10 - 35 U/L 19 19 23   ALT 6 - 29 U/L 14 16 16   Alk Phosphatase 38 - 126 U/L - 62 61  Total Bilirubin 0.2 - 1.2 mg/dL 0.7 0.8 0.6     The ASCVD Risk score (Marsing., et al., 2013) failed to calculate for the following reasons:   The valid systolic blood pressure range is 90 to 200 mmHg   Patient has failed these meds in past: NA Patient is currently uncontrolled on the following medications:  . None  We discussed:  Patient's mother experienced significant myalgias with statin therapy, so patient is very hesitant to start statin therapy. We discussed in detail the patient's cardiac risk factors and the importance of reducing risk of heart attack/stroke. Patient verbalized her understanding but was not ready at this time to start any medication for her cholesterol.   Plan  Continue current medications  Osteoporosis   Last DEXA Scan: 10/21/19   T-Score femoral neck: -3.8  T-Score lumbar spine: -3.9  T-Score forearm radius: -3.6  No results found for: VD25OH   Patient is a candidate for pharmacologic treatment due to T-Score < -2.5 in femoral neck and T-Score < -2.5 in lumbar spine  Patient has failed these meds in past: Fosamax nonadherent Patient is currently uncontrolled on the following medications:  . Life Extension Ultimate Bone Strength twice daily  . Vitamin D 10000u twice weekly  We discussed: Recommend [phone] units of vitamin D daily. Recommend 1200 mg of calcium daily from dietary and supplemental sources. Never started alendronate due to fear of side effects.   Plan  Continue current medications  Will address bisphosphonate retrial at next visit.   Medication Management   [facility] Uses pill box? Yes Pt endorses 100%  compliance  Plan  Continue current medication management strategy  Follow up: 3 month phone visit  [facility] [phone]

## 2020-08-10 ENCOUNTER — Telehealth: Payer: Self-pay

## 2020-08-10 NOTE — Progress Notes (Signed)
Chronic Care Management Pharmacy Assistant   Name: Patricia Galloway  MRN: [id number] DOB: 26-Mar-1951  Reason for Encounter: Disease State  Patient Questions:  1.  Have you seen any other providers since your last visit? Donald Siva RN (CCM) [name] MD (PCP)  2.  Any changes in your medicines or health? Yes, Stopped Calcium Carbonate (pt preference), Ipratropium-Albuterol 20-143mg/act (cost), magnesium 60 mg (course complete), Spirulina (pt choice), Lemon Balm (pt choice) and Liver care supplement (pt choice)    PCP : SSteele Sizer MD  Allergies:   Allergies  Allergen Reactions  . Penicillins Anaphylaxis    Has patient had a PCN reaction causing immediate rash, facial/tongue/throat swelling, SOB or lightheadedness with hypotension: Yes Has patient had a PCN reaction causing severe rash involving mucus membranes or skin necrosis: No Has patient had a PCN reaction that required hospitalization: No Has patient had a PCN reaction occurring within the last 10 years: No If all of the above answers are "NO", then may proceed with Cephalosporin use.   . Codeine Nausea And Vomiting  . Latex Itching  . Sulfa Antibiotics Other (See Comments)    TOPICAL-SULFA CAUSED BLISTERS  . Tape Other (See Comments)    Reaction:  Blisters and burning  Pt states that paper tape is okay.    .[place]otty CourtHcl] Anxiety    Medications: Outpatient Encounter Medications as of 08/10/2020  Medication Sig Note  . acidophilus (RISAQUAD) CAPS capsule Take 3 capsules by mouth at bedtime.    .Marland Kitchenalbuterol (VENTOLIN HFA) 108 (90 Base) MCG/ACT inhaler TAKE 2 PUFFS BY MOUTH EVERY 6 HOURS AS NEEDED FOR WHEEZE OR SHORTNESS OF BREATH   . amLODipine-valsartan (EXFORGE) 5-160 MG tablet TAKE 1 TABLET BY MOUTH EVERY DAY (Patient taking differently: Take 0.5 tablets by mouth 2 (two) times daily. )   . cholecalciferol (VITAMIN D) 1000 units tablet Take 10,000 Units by mouth 2 (two) times a week.   (Patient not taking: Reported on 05/19/2020) 09/11/2018: Takes 2000iu every morning  . Cholecalciferol (VITAMIN D3) 250 MCG (10000 UT) TABS Take 1 tablet by mouth 2 (two) times a week.   . Coenzyme Q10 (COQ10) 400 MG CAPS Take 400 mg by mouth 2 (two) times daily.    . diazepam (VALIUM) 5 MG tablet Take 1 tablet (5 mg total) by mouth daily as needed for anxiety. 30 minutes before doctor's visit   . Digestive Enzyme CAPS Take 1 capsule by mouth 3 (three) times daily after meals.    .Marland KitchenELDERBERRY PO Take by mouth. Every Winter   . fluticasone furoate-vilanterol (BREO ELLIPTA) 200-25 MCG/INH AEPB Inhale 1 puff into the lungs daily.   . Ginkgo Biloba Extract (GNP GINGKO BILOBA EXTRACT) 60 MG CAPS Take 60 mg by mouth as needed.  09/11/2018: Only with radiation exposure  . levalbuterol (XOPENEX HFA) 45 MCG/ACT inhaler Inhale 2 puffs into the lungs every 4 (four) hours as needed for wheezing.   . montelukast (SINGULAIR) 10 MG tablet Take 1 tablet (10 mg total) by mouth at bedtime.   . Multiple Vitamin (MULTIVITAMIN) capsule Take 2 capsules by mouth daily.    .Marland KitchenOVER THE COUNTER MEDICATION Take 1 tablet by mouth 2 (two) times daily. Nutri-Calm 09/11/2018: Only with radiation   No facility-administered encounter medications on file as of 08/10/2020.    Current Diagnosis: Patient Active Problem List   Diagnosis Date Noted  . Pain in limb 03/29/2017  . Basal cell carcinoma 03/29/2017  . Thoracic aortic aneurysm without  rupture ([place]) 10/31/2016  . Atherosclerosis of aorta ([place]) 10/31/2016  . Chronic pancreatitis ([place]) 10/31/2016  . Pulmonary nodule 10/31/2016  . Centrilobular emphysema ([place]) 10/31/2016  . Iron deficiency anemia due to chronic blood loss 10/24/2016  . Basal cell carcinoma, leg, right 08/24/2016  . Hyperglycemia 08/01/2016  . Chronic thoracic back pain 08/04/2015  . Panic attack 08/04/2015  . White coat syndrome without diagnosis of hypertension 08/04/2015  . Major depression, recurrent ([place])  08/04/2015  . Asthma, moderate persistent, poorly-controlled 08/04/2015    Goals Addressed   None     Follow-Up:  Coordination of Enhanced Pharmacy Services, Pharmacist Review and Provide Educational Material to Patient12/02/2020 LVM @ 11:32 08/11/2020 LVM @ 01:07 08/12/2020 LVM @ 10:15  Reviewed chart prior to disease state call. Spoke with patient regarding BP  Recent Office Vitals: BP Readings from Last 3 Encounters:  05/19/20 (!) 210/120  02/13/20 (!) 220/110  11/08/19 (!) 232/113   Pulse Readings from Last 3 Encounters:  05/19/20 97  02/13/20 95  11/08/19 (!) 108    Wt Readings from Last 3 Encounters:  05/19/20 116 lb 9.6 oz (52.9 kg)  02/13/20 116 lb 14.4 oz (53 kg)  11/08/19 123 lb 11.2 oz (56.1 kg)     Kidney Function Lab Results  Component Value Date/Time   CREATININE 0.76 02/13/2020 11:45 AM   CREATININE 0.80 06/17/2019 11:00 AM   CREATININE 0.75 02/12/2019 10:08 AM   GFRNONAA 81 02/13/2020 11:45 AM   GFRAA 93 02/13/2020 11:45 AM    BMP Latest Ref Rng & Units 02/13/2020 06/17/2019 02/12/2019  Glucose 65 - 99 mg/dL 94 - 96  BUN 7 - 25 mg/dL 14 - 18  Creatinine 0.50 - 0.99 mg/dL 0.76 0.80 0.75  BUN/Creat Ratio 6 - 22 (calc) NOT APPLICABLE - NOT APPLICABLE  Sodium 498 - 146 mmol/L 137 - 136  Potassium 3.5 - 5.3 mmol/L 4.1 - 4.3  Chloride 98 - 110 mmol/L 99 - 101  CO2 20 - 32 mmol/L 29 - 24  Calcium 8.6 - 10.4 mg/dL 9.9 - 9.6    . Current antihypertensive regimen:  o Amlodipine-valsartan 5-160 mg tablet 0.5 tab daily . How often are you checking your Blood Pressure? never . Current home BP readings: see above; pt feels what she describes as a panicky ,flight response anytime a blood pressure cuff tightens on arm.  Reviewed blood pressure techniques, alternative blood pressure sites.  Educated patient that if she is using alternative sites to record site along with reading in log. Strongly encouraged patient to find a way to record blood pressure at home on  a regular basis as directed. Pt is agreeable to try obtaining readings from alternative sites/ purchase a wrist blood pressure cuff.  . What recent interventions/DTPs have been made by any provider to improve Blood Pressure control since last CPP Visit: none pt has not met any goal. 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 . Any recent hospitalizations or ED visits since last visit with CPP? No . What diet changes have been made to improve Blood Pressure Control?  o Monitoring salt intake, but "you have to have some salt" . What exercise is being done to improve your Blood Pressure Control?  o Pt is active, takes care of husband, [name] and does household chores. She does not have a formal exercise routine.   Adherence Review: Is the patient currently on ACE/ARB medication? Yes Does the patient have >5 day gap between  last estimated fill dates? No denies.   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 Prior to her husband being sick, she cared for her mom for seven years. Pt encouraged to find 15 to 30 minutes of time for herself two to three times a week in order to keep herself healthy. Reviewed multiple ways to be a caregiver while caring for self (gardening, baking, taking a drive, etc) that did not take her away from her "duties". Reminded pt that she can not care for others if she is sick.  Pt loves animals but is not ready to get another pet after loosing hers a couple of years ago. She is also afraid of Covid/Covid vaccine in relation to her husband. Suggested outdoor time with a friend/neighbor's pet as a way to relieve her burden.   Pt prefers holistic approach to health versus medication. She only wants to take medication as a last resort. She is very open to suggestions from community about "treatments" with herbs/supplements.       Hollie W [employer]

## 2020-08-18 ENCOUNTER — Encounter: Payer: Self-pay | Admitting: Family Medicine

## 2020-08-18 ENCOUNTER — Other Ambulatory Visit: Payer: Self-pay

## 2020-08-18 ENCOUNTER — Ambulatory Visit (INDEPENDENT_AMBULATORY_CARE_PROVIDER_SITE_OTHER): Payer: Medicare HMO

## 2020-08-18 VITALS — Temp 98.0°F | Resp 16 | Ht 63.0 in | Wt 121.6 lb

## 2020-08-18 DIAGNOSIS — I10 Essential (primary) hypertension: Secondary | ICD-10-CM

## 2020-08-18 DIAGNOSIS — J449 Chronic obstructive pulmonary disease, unspecified: Secondary | ICD-10-CM

## 2020-08-18 DIAGNOSIS — Z0000 Encounter for general adult medical examination without abnormal findings: Secondary | ICD-10-CM | POA: Diagnosis not present

## 2020-08-18 DIAGNOSIS — J4489 Other specified chronic obstructive pulmonary disease: Secondary | ICD-10-CM

## 2020-08-18 DIAGNOSIS — J4540 Moderate persistent asthma, uncomplicated: Secondary | ICD-10-CM

## 2020-08-18 MED ORDER — MONTELUKAST SODIUM 10 MG PO TABS: 10.0000 mg | ORAL_TABLET | Freq: Every day | ORAL | 1 refills | Status: DC

## 2020-08-18 MED ORDER — AMLODIPINE BESYLATE-VALSARTAN 5-160 MG PO TABS: 1.0000 | ORAL_TABLET | Freq: Every day | ORAL | 0 refills | Status: DC

## 2020-08-18 NOTE — Progress Notes (Signed)
Subjective:   Patricia Galloway is a 69 y.o. female who presents for Medicare Annual (Subsequent) preventive examination.  Review of Systems     Cardiac Risk Factors include: advanced age (>80mn, >>43women);hypertension     Objective:    Today's Vitals   08/18/20 1331  Resp: 16  Temp: 98 F (36.7 C)  TempSrc: Oral  SpO2: 98%  Weight: 121 lb 9.6 oz (55.2 kg)  Height: 5' 3" (1.6 m)   Body mass index is 21.54 kg/m.  Advanced Directives 08/18/2020 11/07/2019 07/02/2019 06/20/2019 02/11/2019 06/28/2018 03/15/2018  Does Patient Have a Medical Advance Directive? _0  Yes Yes  Type of AParamedicof ATazewellLiving will Living will;Healthcare Power of [place]Walnut CreekLiving will H[place]avenworthLiving will Living will;Healthcare Power of AMorrisLiving will HCanal Lewis[place]Living will  Does patient want to make changes to medical advance directive? - - - - No - Patient declined No - Patient declined -  Copy of H[place]in Chart? Yes - validated most recent copy scanned in chart (See row information) - Yes - validated most recent copy scanned in chart (See row information) - No - copy requested No - copy requested Yes  Would patient like information on creating a medical advance directive? - - - - - - -    Current Medications (verified) Outpatient Encounter Medications as of 08/18/2020  Medication Sig   albuterol (VENTOLIN HFA) 108 (90 Base) MCG/ACT inhaler TAKE 2 PUFFS BY MOUTH EVERY 6 HOURS AS NEEDED FOR WHEEZE OR SHORTNESS OF BREATH   amLODipine-valsartan (EXFORGE) 5-160 MG tablet TAKE 1 TABLET BY MOUTH EVERY DAY (Patient taking differently: Take 0.5 tablets by mouth 2 (two) times daily.)   Cholecalciferol (VITAMIN D3) 250 MCG (10000 UT) TABS Take 1 tablet by mouth 2 (two) times a week.   Coenzyme Q10 (COQ10) 400 MG CAPS Take 400 mg by mouth 2 (two)  times daily.    fluticasone furoate-vilanterol (BREO ELLIPTA) 200-25 MCG/INH AEPB Inhale 1 puff into the lungs daily.   Ginkgo Biloba Extract 60 MG CAPS Take 60 mg by mouth as needed.    montelukast (SINGULAIR) 10 MG tablet Take 1 tablet (10 mg total) by mouth at bedtime.   OVER THE COUNTER MEDICATION Take 1 tablet by mouth 2 (two) times daily. Nutri-Calm   OVER THE COUNTER MEDICATION Life extension bone strength / life extension skin restore ceramide / life extension super bio curcumin   diazepam (VALIUM) 5 MG tablet Take 1 tablet (5 mg total) by mouth daily as needed for anxiety. 30 minutes before doctor's visit (Patient not taking: Reported on 08/18/2020)   ELDERBERRY PO Take by mouth. Every Winter (Patient not taking: Reported on 08/18/2020)   levalbuterol (XOPENEX HFA) 45 MCG/ACT inhaler Inhale 2 puffs into the lungs every 4 (four) hours as needed for wheezing. (Patient not taking: Reported on 08/18/2020)   Multiple Vitamin (MULTIVITAMIN) capsule Take 2 capsules by mouth daily.  (Patient not taking: Reported on 08/18/2020)   [DISCONTINUED] acidophilus (RISAQUAD) CAPS capsule Take 3 capsules by mouth at bedtime.    [DISCONTINUED] cholecalciferol (VITAMIN D) 1000 units tablet Take 10,000 Units by mouth 2 (two) times a week.   [DISCONTINUED] Digestive Enzyme CAPS Take 1 capsule by mouth 3 (three) times daily after meals.    No facility-administered encounter medications on file as of 08/18/2020.    Allergies (verified) Penicillins, Codeine, Latex, Sulfa antibiotics, Tape, and  Xopenex [levalbuterol hcl]   History: Past Medical History:  Diagnosis Date   Anemia    has had iron infusion   Anxiety    complicated grief   Aortic aneurysm ([place])    found on CT in December, 2017   Asthma    as child , but no episodes in over 30 years   Basal cell carcinoma    Cancer ([place]) 08/2016   basal cell carcinoma - right leg   History of transfusion of packed red blood cells     Personal history of chemotherapy 2018   basal cell    Past Surgical History:  Procedure Laterality Date   BASAL CELL CARCINOMA EXCISION Right 03/29/2017   Procedure: EXCISION OF RIGHT LEG BASEL CELL CARCINOMA;  Surgeon: [name], DO;  Location: [facility];  Service: Plastics;  Laterality: Right;   COLON SURGERY  2002   colostomy for 10 months after a perfurated sigmoid    COLOSTOMY REVERSAL  2003   EMBOLIZATION Right 10/12/2016   Procedure: Embolization;  Surgeon: [name], MD;  Location: [facility];  Service: Cardiovascular;  Laterality: Right;   etopic  9784,7841   INCISION AND DRAINAGE OF WOUND Right 03/30/2017   Procedure: IRRIGATION AND DEBRIDEMENT WOUND; VAC PLACEMENT;  Surgeon: [name], DO;  Location: [facility];  Service: Plastics;  Laterality: Right;   TONSILLECTOMY     Family History  Problem Relation Age of Onset   Dementia Mother    Aortic stenosis Mother    Osteoporosis Mother    Dementia Father    Diabetes Father    Hyperlipidemia Father    Hypertension Father    Aneurysm Sister    Hyperlipidemia Sister    Hypertension Sister    Diabetes Sister    Breast cancer Neg Hx    Social History   Socioeconomic History   Marital status: Married    Spouse name: John   Number of children: 0   Years of education: Not on file   Highest education level: Bachelor's degree (e.g., BA, AB, BS)  Occupational History   Occupation: Retired  Tobacco Use   Smoking status: Former Smoker    Packs/day: 0.50    Years: 25.00    Pack years: 12.50    Types: Cigarettes    Start date: 09/05/1972    Quit date: 08/03/1998    Years since quitting: 22.0   Smokeless tobacco: Never Used   Tobacco comment: smoking cessation materials not required  Vaping Use   Vaping Use: Never used  Substance and Sexual Activity   Alcohol use: No    Alcohol/week: 0.0 standard drinks   Drug use: No   Sexual activity: Not Currently    Birth  control/protection: Post-menopausal  Other Topics Concern   Not on file  Social History Narrative   Pt is primary caregiver for her husband who has stage 4 renal cell carcinoma    Social Determinants of Health   Financial Resource Strain: Low Risk    Difficulty of Paying Living Expenses: Not very hard  Food Insecurity: No Food Insecurity   Worried About Charity fundraiser in the Last Year: Never true   Ran Out of Food in the Last Year: Never true  Transportation Needs: No Transportation Needs   Lack of Transportation (Medical): No   Lack of Transportation (Non-Medical): No  Physical Activity: Inactive   Days of Exercise per Week: 0 days   Minutes of Exercise per Session: 0 min  Stress: No Stress Concern Present   Feeling of Stress : Only a little  Social Connections: Unknown   Frequency of Communication with Friends and Family: Not on file   Frequency of Social Gatherings with Friends and Family: Not on file   Attends Religious Services: Not on Electrical engineer or Organizations: Not on file   Attends Archivist Meetings: Not on file   Marital Status: Married    Tobacco Counseling Counseling given: Not Answered Comment: smoking cessation materials not required   Clinical Intake:  Pre-visit preparation completed: Yes  Pain : No/denies pain     BMI - recorded: 21.54 Nutritional Risks: None Diabetes: No  How often do you need to have someone help you when you read instructions, pamphlets, or other written materials from your doctor or pharmacy?: 1 - Never    Interpreter Needed?: No  Information entered by :: [name] LPN   Activities of Daily Living In your present state of health, do you have any difficulty performing the following activities: 08/18/2020 02/13/2020  Hearing? N N  Comment declines hearing aids -  Vision? N N  Difficulty concentrating or making decisions? N N  Walking or climbing stairs? N N  Dressing  or bathing? N N  Doing errands, shopping? N N  Preparing Food and eating ? N -  Using the Toilet? N -  In the past six months, have you accidently leaked urine? N -  Do you have problems with loss of bowel control? N -  Managing your Medications? N -  Managing your Finances? N -  Housekeeping or managing your Housekeeping? N -  Some recent data might be hidden    Patient Care Team: [name], MD as PCP - General (Family Medicine) Lloyd Huger, MD as Consulting Physician (Oncology) Dilling[place], Loel Lofty, DO as Attending Physician (Plastic Surgery) Neldon Labella, RN as Case Manager Germaine Pomfret, [facility] as Pharmacist (Pharmacist)  Indicate any recent Medical Services you may have received from other than [facility] providers in the past year (date may be approximate).     Assessment:   This is a routine wellness examination for Sheril.  Hearing/Vision screen  Hearing Screening   125Hz 250Hz 500Hz 1000Hz 2000Hz 3000Hz 4000Hz 6000Hz 8000Hz  Right ear:           Left ear:           Comments: Pt denies hearing difficulty  Vision Screening Comments: Past due for eye exam; not established with a  provider  Dietary issues and exercise activities discussed: Current Exercise Habits: The patient does not participate in regular exercise at present, Exercise limited by: respiratory conditions(s)  Goals      "I am having a hard time paying for my medications because property tax is due" (pt-stated)      Nurse Case Manager Clinical Goal(s): Over the next 30 days patient will report completing application for property tax reduction  Interventions:   Provided patient with application for [place] property tax reduction as patient qualifies related to age and income.           Chronic Care Management      CARE PLAN ENTRY (see longitudinal plan of care for additional care plan information)  Current Barriers:   Chronic Disease Management support, education, and care  coordination needs related to Hypertension, Hyperlipidemia, Asthma, and Osteoporosis   Hypertension BP Readings from Last 3 Encounters:  02/13/20 (!) 220/110  11/08/19 (!) 232/113  07/02/19 (!) 152/92    Pharmacist Clinical Goal(s): o Over the next 90 days, patient will work with PharmD and providers to achieve BP goal <140/90  Current regimen:  o Amlodipine-Valsartan 5/174m 1/2 tablet twice daily   Patient self care activities - Over the next 90 days, patient will: o Check Blood Pressure 3-4 times weekly daily, document, and provide at future appointments o Ensure daily salt intake < 2300 mg/day  Hyperlipidemia Lab Results  Component Value Date/Time   LDLCALC 161 (H) 02/13/2020 11:45 AM    Pharmacist Clinical Goal(s): o Over the next 90 days, patient will work with PharmD and providers to achieve LDL goal < 100  Current regimen:  o None  Interventions: o Try lifestyle modifications before starting statin  Patient self care activities - Over the next 90 days, patient will: o Reduce consumption of saturated fat  Asthma  Pharmacist Clinical Goal(s) o Over the next 90 days, patient will work with PharmD and providers to improve breathing  Current regimen:  o Breo Ellipta 200-25 mcg 1 puff daily o Singulair 177mdaily  Interventions: o Recommend Intal Cromolyn inhaler 2 puffs as needed  Patient self care activities - Over the next 90 days, patient will: o Avoid asthma triggers whenever possible o Use new inhaler as rescue and report results to PharmD  Osteoporosis  Pharmacist Clinical Goal(s) o Over the next 90 days, patient will work with PharmD and providers to improve bone health  Current regimen:  o Life Extension Ultimate Bone Health twice daily o Vitamin D3 10000 iu twice weekly   Interventions: o Ensure [id number] units of vitamin D daily.  o Ensure 1200 mg of calcium daily from dietary and supplemental sources.  Patient self care activities - Over  the next 90 days, patient will: o Consider Fosamax to increase bone density  Medication management  Pharmacist Clinical Goal(s): o Over the next 90 days, patient will work with PharmD and providers to maintain optimal medication adherence  Current [facility]  Interventions o Comprehensive medication review performed. o Continue current medication management strategy  Patient self care activities - Over the next 90 days, patient will: o Focus on medication adherence by taking blood pressure medications as directed o Report any questions or concerns to PharmD and/or provider(s)  Initial goal documentation       Chronic Disease Management      CARE PLAN ENTRY (see longitudinal plan of care for additional care plan information)  Current Barriers:   Chronic Disease Management support and education needs related to Dyslipidemia, Emphysema, and White Coat Syndrome with HTN. (Hx of Basal Cell Cancer)  Case Manager Clinical Goal(s):  Over the next 120 days, patient will:  Take all medications as prescribed.  Attend all medical appointments as scheduled.  Monitor blood pressure and record readings.  Follow treatment recommendations to prevent emphysema/COPD exacerbations.  Follow recommended safety measures to prevent falls and injuries. Over the next 45 days, patient will:  Outreach with CCM Pharmacist.-Complete  Interventions:   Inter-disciplinary care team collaboration (see longitudinal plan of care)  Discussed medications and current treatment plan. Completed outreach with the CCM Pharmacist to discuss inhaler options. Denies shortness of breath or exacerbations. Oxygen saturations remain in the high 90's. Continues to take medications as prescribed. Reviewed home BP readings and discussed indications for contacting her provider. She is aware that clinical BP readings are significantly higher d/t her hx of White Coat Syndrome. She does not monitor her BP daily at home  but attempts to monitor routinely. Reports recent reading of 134/88. Denies any concerning symptoms over the past few weeks. No changes or decline in activity tolerance.   Reviewed scheduled/upcoming appointments. No concerns regarding transportation. She is scheduled for a PCP visit on 05/19/20.  Discussed plans for ongoing care management. 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 Denies current need for additional assistance but agreed to update the team if needs change. She is doing well and remains very motivated to improve her overall health. No urgent concerns or changes in care management needs. The care management team will outreach again in October.   Patient Self Care Activities:   Self administers medications  Attends scheduled provider appointments  Calls pharmacy for medication refills  Performs ADL's independently  Performs IADL's independently  Calls provider office for new concerns or questions   Please see past updates related to this goal by clicking on the "Past Updates" button in the selected goal        DIET - INCREASE WATER INTAKE      Recommend to drink at least 6-8 8oz glasses of water per day.      I need to get my breathing improved (pt-stated)      Nurse Case Manager Clinical Goal(s): Over the next 30 days patient will report implementing stratagies discussed today to improve breathing  Interventions:   Basic COPD/Asthma education provided, discussed strategies to prevent bronchospasms  Reviewed pursed lip breathing  Provided patient with smoking cessation information including Quit Smart Classes    Patient Self Care Activities: ex. Takes medications (independent), administers insulin (in progress), checks cbg daily (independent)  Patient will take all medications as prescribed  Patient will continue to exercise weekly to increase stamina and strength and decrease shortness of breath  Patient will utilize pursed lip  breathing for shortness of breath  Please see past updates related to this goal by clicking on the "Past Updates" button in the selected goal        I want to know that I have clean air in my home (pt-stated)      Nurse Case Manager Clinical Goal(s): Over the next 30 days patient will verbalize utilizing resource for mold detection  Interventions:   Provided patient with resource for mold detection   Headquarters: [address], [place], [place] [address] Serving all of [place] and [place] Phone: [phone] Business Hours: 9:00am-5:00pm M-F         Increase physical activity      Patient would like to implement a physical activity routine and "feel alive again"      Depression Screen PHQ 2/9 Scores 08/18/2020 04/03/2020 02/13/2020 07/02/2019 03/05/2019 11/16/2018 08/16/2018  PHQ - 2 Score 0 0 0 0 0 0 0  PHQ- 9 Score - - 2 - _0 Fall Risk Fall Risk  08/18/2020 05/19/2020 02/13/2020 07/02/2019 03/05/2019  Falls in the past year? 0 0 0 0 0  Comment - - - - -  Number falls in past yr: 0 0 0 0 0  Injury with Fall? 0 0 0 0 0  Risk for fall due to : No Fall Risks - - - -  Follow up Falls prevention discussed - - Falls prevention discussed -    FALL RISK PREVENTION PERTAINING TO THE HOME:  Any stairs in or around the home? No  If so, are there any without handrails? No  Home free of loose throw rugs  in walkways, pet beds, electrical cords, etc? Yes  Adequate lighting in your home to reduce risk of falls? Yes   ASSISTIVE DEVICES UTILIZED TO PREVENT FALLS:  Life alert? No  Use of a cane, walker or w/c? No  Grab bars in the bathroom? Yes  Shower chair or bench in shower? Yes  Elevated toilet seat or a handicapped toilet? Yes   TIMED UP AND GO:  Was the test performed? Yes .  Length of time to ambulate 10 feet: 5 sec.   Gait steady and fast without use of assistive device  Cognitive Function: Normal cognitive status assessed by direct observation  by this Nurse Health Advisor. No abnormalities found.       6CIT Screen 12/26/2017  What Year? 0 points  What month? 0 points  What time? 0 points  Count back from 20 0 points  Months in reverse 0 points  Repeat phrase 0 points  Total Score 0    Immunizations Immunization History  Administered Date(s) Administered   Tdap 08/08/2016    TDAP status: Up to date  Flu Vaccine status: Declined, Education has been provided regarding the importance of this vaccine but patient still declined. Advised may receive this vaccine at local pharmacy or Health Dept. Aware to provide a copy of the vaccination record if obtained from local pharmacy or Health Dept. Verbalized acceptance and understanding.  Pneumococcal vaccine status: Declined,  Education has been provided regarding the importance of this vaccine but patient still declined. Advised may receive this vaccine at local pharmacy or Health Dept. Aware to provide a copy of the vaccination record if obtained from local pharmacy or Health Dept. Verbalized acceptance and understanding.   Covid-19 vaccine status: Declined, Education has been provided regarding the importance of this vaccine but patient still declined. Advised may receive this vaccine at local pharmacy or Health Dept.or vaccine clinic. Aware to provide a copy of the vaccination record if obtained from local pharmacy or Health Dept. Verbalized acceptance and understanding.  Qualifies for Shingles Vaccine? Yes   Zostavax completed No   Shingrix Completed?: No.    Education has been provided regarding the importance of this vaccine. Patient has been advised to call insurance company to determine out of pocket expense if they have not yet received this vaccine. Advised may also receive vaccine at local pharmacy or Health Dept. Verbalized acceptance and understanding.  Screening Tests Health Maintenance  Topic Date Due   COVID-19 Vaccine (1) 09/03/2020 (Originally 07/28/1963)    COLONOSCOPY  02/12/2021 (Originally 07/27/2001)   PNA vac Low Risk Adult (1 of 2 - PCV13) 05/19/2021 (Originally 07/27/2016)   MAMMOGRAM  10/20/2021   TETANUS/TDAP  08/08/2026   DEXA SCAN  Completed   Hepatitis C Screening  Completed   INFLUENZA VACCINE  Discontinued    Health Maintenance  There are no preventive care reminders to display for this patient. Colorectal cancer screening: pt declines colonoscopy; pt has Cologuard kit and [employer] kit at home but has not completed either yet.   Mammogram status: Completed 10/21/19. Repeat every year  Bone Density status: Completed 10/21/19. Results reflect: Bone density results: OSTEOPOROSIS. Repeat every 2 years.  Lung Cancer Screening: (Low Dose CT Chest recommended if Age 13-80 years, 30 pack-year currently smoking OR have quit w/in 15years.) does not qualify.   Additional Screening:  Hepatitis C Screening: does qualify; Completed 12/26/17  Vision Screening: Recommended annual ophthalmology exams for early detection of glaucoma and other disorders of the eye. Is the patient up  to date with their annual eye exam?  No  Who is the provider or what is the name of the office in which the patient attends annual eye exams? Not established If pt is not established with a provider, would they like to be referred to a provider to establish care? No .   Dental Screening: Recommended annual dental exams for proper oral hygiene  Community Resource Referral / Chronic Care Management: CRR required this visit?  No   CCM required this visit?  No      Plan:     I have personally reviewed and noted the following in the patients chart:    Medical and social history  Use of alcohol, tobacco or illicit drugs   Current medications and supplements  Functional ability and status  Nutritional status  Physical activity  Advanced directives  List of other physicians  Hospitalizations, surgeries, and ER visits in previous 12  months  Vitals  Screenings to include cognitive, depression, and falls  Referrals and appointments  In addition, I have reviewed and discussed with patient certain preventive protocols, quality metrics, and best practice recommendations. A written personalized care plan for preventive services as well as general preventive health recommendations were provided to patient.     [name], LPN   [id number]   Nurse Notes: pt states she has sever white coat syndrome; did not take valium prior to visit today because she does not like how it makes her feel. Pt's BP in office today 172/98. Pt plans to get an at home wrist monitor to check for range of BP as directed by CCM pharmacist.   Pt requests refill for amlodipine-valsartan 5-160 mg. She is currently only taking 1/2 tablet by mouth daily in am. She discontinued PM dose due to several occasions in the evening of feeling like she was going to pass out. Pt advised will notify Dr. Ancil Boozer for refill request. Pt's next follow up with Dr. Ancil Boozer scheduled for 11/23/20.

## 2020-08-18 NOTE — Patient Instructions (Signed)
Patricia Galloway , Thank you for taking time to come for your Medicare Wellness Visit. I appreciate your ongoing commitment to your health goals. Please review the following plan we discussed and let me know if I can assist you in the future.   Screening recommendations/referrals: Colonoscopy: Please complete Cologuard kit at home.  Mammogram: done 10/21/19 Bone Density: done 10/21/19 Recommended yearly ophthalmology/optometry visit for glaucoma screening and checkup Recommended yearly dental visit for hygiene and checkup  Vaccinations: Influenza vaccine: declined Pneumococcal vaccine: declined Tdap vaccine: done 08/08/16 Shingles vaccine: declined   Covid-19:declined  Conditions/risks identified: Recommend increasing physical activity to at least 3 days per week   Next appointment: Follow up in one year for your annual wellness visit    Preventive Care 65 Years and Older, Female Preventive care refers to lifestyle choices and visits with your health care provider that can promote health and wellness. What does preventive care include?  A yearly physical exam. This is also called an annual well check.  Dental exams once or twice a year.  Routine eye exams. Ask your health care provider how often you should have your eyes checked.  Personal lifestyle choices, including:  Daily care of your teeth and gums.  Regular physical activity.  Eating a healthy diet.  Avoiding tobacco and drug use.  Limiting alcohol use.  Practicing safe sex.  Taking low-dose aspirin every day.  Taking vitamin and mineral supplements as recommended by your health care provider. What happens during an annual well check? The services and screenings done by your health care provider during your annual well check will depend on your age, overall health, lifestyle risk factors, and family history of disease. Counseling  Your health care provider may ask you questions about your:  Alcohol use.  Tobacco  use.  Drug use.  Emotional well-being.  Home and relationship well-being.  Sexual activity.  Eating habits.  History of falls.  Memory and ability to understand (cognition).  Work and work Statistician.  Reproductive health. Screening  You may have the following tests or measurements:  Height, weight, and BMI.  Blood pressure.  Lipid and cholesterol levels. These may be checked every 5 years, or more frequently if you are over 21 years old.  Skin check.  Lung cancer screening. You may have this screening every year starting at age 39 if you have a 30-pack-year history of smoking and currently smoke or have quit within the past 15 years.  Fecal occult blood test (FOBT) of the stool. You may have this test every year starting at age 8.  Flexible sigmoidoscopy or colonoscopy. You may have a sigmoidoscopy every 5 years or a colonoscopy every 10 years starting at age 78.  Hepatitis C blood test.  Hepatitis B blood test.  Sexually transmitted disease (STD) testing.  Diabetes screening. This is done by checking your blood sugar (glucose) after you have not eaten for a while (fasting). You may have this done every 1-3 years.  Bone density scan. This is done to screen for osteoporosis. You may have this done starting at age 73.  Mammogram. This may be done every 1-2 years. Talk to your health care provider about how often you should have regular mammograms. Talk with your health care provider about your test results, treatment options, and if necessary, the need for more tests. Vaccines  Your health care provider may recommend certain vaccines, such as:  Influenza vaccine. This is recommended every year.  Tetanus, diphtheria, and acellular pertussis (Tdap, Td)  vaccine. You may need a Td booster every 10 years.  Zoster vaccine. You may need this after age 29.  Pneumococcal 13-valent conjugate (PCV13) vaccine. One dose is recommended after age 72.  Pneumococcal  polysaccharide (PPSV23) vaccine. One dose is recommended after age 30. Talk to your health care provider about which screenings and vaccines you need and how often you need them. This information is not intended to replace advice given to you by your health care provider. Make sure you discuss any questions you have with your health care provider. Document Released: 09/18/2015 Document Revised: 05/11/2016 Document Reviewed: 06/23/2015 Elsevier Interactive Patient Education  2017 [employer] Prevention in the Home Falls can cause injuries. They can happen to people of all ages. There are many things you can do to make your home safe and to help prevent falls. What can I do on the outside of my home?  Regularly fix the edges of walkways and driveways and fix any cracks.  Remove anything that might make you trip as you walk through a door, such as a raised step or threshold.  Trim any bushes or trees on the path to your home.  Use bright outdoor lighting.  Clear any walking paths of anything that might make someone trip, such as rocks or tools.  Regularly check to see if handrails are loose or broken. Make sure that both sides of any steps have handrails.  Any raised decks and porches should have guardrails on the edges.  Have any leaves, snow, or ice cleared regularly.  Use sand or salt on walking paths during winter.  Clean up any spills in your garage right away. This includes oil or grease spills. What can I do in the bathroom?  Use night lights.  Install grab bars by the toilet and in the tub and shower. Do not use towel bars as grab bars.  Use non-skid mats or decals in the tub or shower.  If you need to sit down in the shower, use a plastic, non-slip stool.  Keep the floor dry. Clean up any water that spills on the floor as soon as it happens.  Remove soap buildup in the tub or shower regularly.  Attach bath mats securely with double-sided non-slip rug  tape.  Do not have throw rugs and other things on the floor that can make you trip. What can I do in the bedroom?  Use night lights.  Make sure that you have a light by your bed that is easy to reach.  Do not use any sheets or blankets that are too big for your bed. They should not hang down onto the floor.  Have a firm chair that has side arms. You can use this for support while you get dressed.  Do not have throw rugs and other things on the floor that can make you trip. What can I do in the kitchen?  Clean up any spills right away.  Avoid walking on wet floors.  Keep items that you use a lot in easy-to-reach places.  If you need to reach something above you, use a strong step stool that has a grab bar.  Keep electrical cords out of the way.  Do not use floor polish or wax that makes floors slippery. If you must use wax, use non-skid floor wax.  Do not have throw rugs and other things on the floor that can make you trip. What can I do with my stairs?  Do not leave  any items on the stairs.  Make sure that there are handrails on both sides of the stairs and use them. Fix handrails that are broken or loose. Make sure that handrails are as long as the stairways.  Check any carpeting to make sure that it is firmly attached to the stairs. Fix any carpet that is loose or worn.  Avoid having throw rugs at the top or bottom of the stairs. If you do have throw rugs, attach them to the floor with carpet tape.  Make sure that you have a light switch at the top of the stairs and the bottom of the stairs. If you do not have them, ask someone to add them for you. What else can I do to help prevent falls?  Wear shoes that:  Do not have high heels.  Have rubber bottoms.  Are comfortable and fit you well.  Are closed at the toe. Do not wear sandals.  If you use a stepladder:  Make sure that it is fully opened. Do not climb a closed stepladder.  Make sure that both sides of the  stepladder are locked into place.  Ask someone to hold it for you, if possible.  Clearly mark and make sure that you can see:  Any grab bars or handrails.  First and last steps.  Where the edge of each step is.  Use tools that help you move around (mobility aids) if they are needed. These include:  Canes.  Walkers.  Scooters.  Crutches.  Turn on the lights when you go into a dark area. Replace any light bulbs as soon as they burn out.  Set up your furniture so you have a clear path. Avoid moving your furniture around.  If any of your floors are uneven, fix them.  If there are any pets around you, be aware of where they are.  Review your medicines with your doctor. Some medicines can make you feel dizzy. This can increase your chance of falling. Ask your doctor what other things that you can do to help prevent falls. This information is not intended to replace advice given to you by your health care provider. Make sure you discuss any questions you have with your health care provider. Document Released: 06/18/2009 Document Revised: 01/28/2016 Document Reviewed: 09/26/2014 Elsevier Interactive Patient Education  2017 [employer].

## 2020-09-02 ENCOUNTER — Other Ambulatory Visit: Payer: Self-pay | Admitting: Family Medicine

## 2020-09-02 DIAGNOSIS — J449 Chronic obstructive pulmonary disease, unspecified: Secondary | ICD-10-CM

## 2020-09-02 DIAGNOSIS — J4489 Other specified chronic obstructive pulmonary disease: Secondary | ICD-10-CM

## 2020-09-02 DIAGNOSIS — J4540 Moderate persistent asthma, uncomplicated: Secondary | ICD-10-CM

## 2020-09-15 ENCOUNTER — Telehealth: Payer: Self-pay

## 2020-09-15 NOTE — Chronic Care Management (AMB) (Signed)
Chronic Care Management Pharmacy Assistant   Name: Patricia Galloway  MRN: [id number] DOB: Jul 18, 1951  Reason for Encounter: Hyperlipidemia Adherence Call  Patient Questions:  1.  Have you seen any other providers since your last visit? Yes, 08/18/20-Uthus, Kasey, LPN ([facility])  2.  Any changes in your medicines or health? Yes, 08/18/20-Discontinued Digestive Enzymes, Discontinued Probiotic Product.    PCP : [name], MD  Allergies:   Allergies  Allergen Reactions  . Penicillins Anaphylaxis    Has patient had a PCN reaction causing immediate rash, facial/tongue/throat swelling, SOB or lightheadedness with hypotension: Yes Has patient had a PCN reaction causing severe rash involving mucus membranes or skin necrosis: No Has patient had a PCN reaction that required hospitalization: No Has patient had a PCN reaction occurring within the last 10 years: No If all of the above answers are "NO", then may proceed with Cephalosporin use.   . Codeine Nausea And Vomiting  . Latex Itching  . Sulfa Antibiotics Other (See Comments)    TOPICAL-SULFA CAUSED BLISTERS  . Tape Other (See Comments)    Reaction:  Blisters and burning  Pt states that paper tape is okay.    [address] Hcl] Anxiety    Medications: Outpatient Encounter Medications as of 09/15/2020  Medication Sig Note  . albuterol (VENTOLIN HFA) 108 (90 Base) MCG/ACT inhaler TAKE 2 PUFFS BY MOUTH EVERY 6 HOURS AS NEEDED FOR WHEEZE OR SHORTNESS OF BREATH   . amLODipine-valsartan (EXFORGE) 5-160 MG tablet Take 1 tablet by mouth daily.   Marland Kitchen BREO ELLIPTA 200-25 MCG/INH AEPB TAKE 1 PUFF BY MOUTH EVERY DAY   . Cholecalciferol (VITAMIN D3) 250 MCG (10000 UT) TABS Take 1 tablet by mouth 2 (two) times a week.   . Coenzyme Q10 (COQ10) 400 MG CAPS Take 400 mg by mouth 2 (two) times daily.    . diazepam (VALIUM) 5 MG tablet Take 1 tablet (5 mg total) by mouth daily as needed for anxiety. 30 minutes before doctor's visit (Patient  not taking: Reported on 08/18/2020)   . ELDERBERRY PO Take by mouth. Every Winter (Patient not taking: Reported on 08/18/2020)   . Ginkgo Biloba Extract 60 MG CAPS Take 60 mg by mouth as needed.  09/11/2018: Only with radiation exposure  . levalbuterol (XOPENEX HFA) 45 MCG/ACT inhaler Inhale 2 puffs into the lungs every 4 (four) hours as needed for wheezing. (Patient not taking: Reported on 08/18/2020)   . montelukast (SINGULAIR) 10 MG tablet Take 1 tablet (10 mg total) by mouth at bedtime.   . Multiple Vitamin (MULTIVITAMIN) capsule Take 2 capsules by mouth daily.  (Patient not taking: Reported on 08/18/2020)   . OVER THE COUNTER MEDICATION Take 1 tablet by mouth 2 (two) times daily. Nutri-Calm 09/11/2018: Only with radiation  . OVER THE COUNTER MEDICATION Life extension bone strength / life extension skin restore ceramide / life extension super bio curcumin    No facility-administered encounter medications on file as of 09/15/2020.    Current Diagnosis: Patient Active Problem List   Diagnosis Date Noted  . Pain in limb 03/29/2017  . Basal cell carcinoma 03/29/2017  . Thoracic aortic aneurysm without rupture ([place]) 10/31/2016  . Atherosclerosis of aorta ([place]) 10/31/2016  . Chronic pancreatitis ([place]) 10/31/2016  . Pulmonary nodule 10/31/2016  . Centrilobular emphysema ([place]) 10/31/2016  . Iron deficiency anemia due to chronic blood loss 10/24/2016  . Basal cell carcinoma, leg, right 08/24/2016  . Hyperglycemia 08/01/2016  . Chronic thoracic back pain 08/04/2015  .  Panic attack 08/04/2015  . White coat syndrome without diagnosis of hypertension 08/04/2015  . Major depression, recurrent ([place]) 08/04/2015  . Asthma, moderate persistent, poorly-controlled 08/04/2015   09/15/2020 Name: Patricia Galloway MRN: [id number] DOB: Apr 03, 1951 Patricia Galloway is a 70 y.o. year old female who is a primary care patient of [name], MD.  Comprehensive medication review performed; Spoke to patient regarding  cholesterol  Lipid Panel    Component Value Date/Time   CHOL 274 (H) 02/13/2020 1145   TRIG 67 02/13/2020 1145   HDL 97 02/13/2020 1145   LDLCALC 161 (H) 02/13/2020 1145    10-year ASCVD risk score: The ASCVD Risk score [name] DC Jr., et al., 2013) failed to calculate for the following reasons:   The valid systolic blood pressure range is 90 to 200 mmHg  . Current antihyperlipidemic regimen:  o None . Previous antihyperlipidemic medications tried: None  . ASCVD risk enhancing conditions: age >62 and HTN   . What recent interventions/DTPs have been made by any provider to improve Cholesterol control since last CPP Visit: Recent interventions was to reduce consumption of saturated fat.  . Any recent hospitalizations or ED visits since last visit with CPP? No   . What diet changes have been made to improve Cholesterol?  o Patient reports she has cut back from eating all fish because it was raising her cholesterol. Per patient; she is eating very little red meat with mostly rice and veggies.  . What exercise is being done to improve Cholesterol?  o Patient reports she is taking care of her husband who is ill with stage 4 kidney cancer. Per patient; her plate is pretty full and she is focused with cooking, cleaning, and caring for husband.  Adherence Review: Does the patient have >5 day gap between last estimated fill dates? No   Follow-Up:  Pharmacist Review   Raynelle High[place], [place] Pharmacist Assistant [phone]

## 2020-10-06 ENCOUNTER — Ambulatory Visit (INDEPENDENT_AMBULATORY_CARE_PROVIDER_SITE_OTHER): Payer: Medicare HMO | Admitting: Family Medicine

## 2020-10-06 ENCOUNTER — Other Ambulatory Visit: Payer: Self-pay

## 2020-10-06 ENCOUNTER — Encounter: Payer: Self-pay | Admitting: Family Medicine

## 2020-10-06 VITALS — BP 160/98 | HR 108 | Temp 98.2°F | Resp 16 | Ht 63.0 in | Wt 118.9 lb

## 2020-10-06 DIAGNOSIS — J441 Chronic obstructive pulmonary disease with (acute) exacerbation: Secondary | ICD-10-CM

## 2020-10-06 DIAGNOSIS — R918 Other nonspecific abnormal finding of lung field: Secondary | ICD-10-CM

## 2020-10-06 DIAGNOSIS — E785 Hyperlipidemia, unspecified: Secondary | ICD-10-CM | POA: Diagnosis not present

## 2020-10-06 DIAGNOSIS — Z85828 Personal history of other malignant neoplasm of skin: Secondary | ICD-10-CM

## 2020-10-06 DIAGNOSIS — J4540 Moderate persistent asthma, uncomplicated: Secondary | ICD-10-CM | POA: Diagnosis not present

## 2020-10-06 DIAGNOSIS — I10 Essential (primary) hypertension: Secondary | ICD-10-CM

## 2020-10-06 DIAGNOSIS — J432 Centrilobular emphysema: Secondary | ICD-10-CM | POA: Diagnosis not present

## 2020-10-06 DIAGNOSIS — I712 Thoracic aortic aneurysm, without rupture, unspecified: Secondary | ICD-10-CM

## 2020-10-06 DIAGNOSIS — Z9889 Other specified postprocedural states: Secondary | ICD-10-CM | POA: Diagnosis not present

## 2020-10-06 DIAGNOSIS — J449 Chronic obstructive pulmonary disease, unspecified: Secondary | ICD-10-CM

## 2020-10-06 DIAGNOSIS — M810 Age-related osteoporosis without current pathological fracture: Secondary | ICD-10-CM

## 2020-10-06 MED ORDER — VALSARTAN 160 MG PO TABS: 160.0000 mg | ORAL_TABLET | Freq: Every day | ORAL | 1 refills | Status: DC

## 2020-10-06 MED ORDER — BENZONATATE 100 MG PO CAPS: 100.0000 mg | ORAL_CAPSULE | Freq: Two times a day (BID) | ORAL | 0 refills | Status: DC | PRN

## 2020-10-06 MED ORDER — AMLODIPINE BESYLATE 5 MG PO TABS: 5.0000 mg | ORAL_TABLET | Freq: Every evening | ORAL | 1 refills | Status: DC

## 2020-10-06 MED ORDER — ALBUTEROL SULFATE HFA 108 (90 BASE) MCG/ACT IN AERS: 2.0000 | INHALATION_SPRAY | Freq: Four times a day (QID) | RESPIRATORY_TRACT | 0 refills | Status: DC | PRN

## 2020-10-06 NOTE — Progress Notes (Signed)
Name: Patricia Galloway   MRN: [id number]    DOB: [birth year]   Date:10/06/2020       Progress Note  Subjective  Chief Complaint  Follow up  HPI  Basal cell carcinoma right leg:she is doing well, s/p removal and chemotherapy.Her gait is back to normal , full rom of motion. Under the care of Dr. Grayland Ormond . She states she has intermittent aching at the scar area. She is wondering about having PET scan and or CT. She will discuss it with Dr. Grayland Ormond   Lung nodules: monitored by Dr. Grayland Ormond , one new nodule on PET scan done 11/06/2019 and advised to follow up CT lung 6-12 months, it is schedule for March 2022. She asked about having CT of her leg, advised to contact Dr. Grayland Ormond   Hyperlipidemia:she refuses statin therapy   HTN with white coat: shestarted HCTZ Feb 2020, however she feels tired daily since she started medication, discussed 24 hours bp monitor and referral to cardiologist and pulmonologist, however she states even checking her bp at home it makes her nervous, and bp spikes, she has been taking Valium prior to doctor's visits but does not seem to decrease bp reading. BP today was higher than usual for her, she has not been taking HCTZ because is causing urinary frequency, discussed importance of bp control, last visit back in June we started her to Exforge 5/160 mg , she has been very compliant but stayed on half dose because she has noticed mild episode of dizziness sometimes takes another half at night. She also states medication no longer free, we will change from exforge to diovan 160 mg in am and norvasc 5 mg at night, explained importance of taking it daily as prescribed   Major Depression in remission:she is still doing well, phq 9 is back to normal, however she was tearful during her visit. She has been taking Valerian Root . Her husband has cancer, she has been the caregiver, she has been doing all the house chores and is feeling very tired, but appreciates when he is able  to do some dishes or start his own laundry  Also has difficulty sleeping at night. Denies any recent panic attacks but seems to still feel anxious. We discussed last time for her to take time for herself, but she has been unable to do it as suggested.  Asthma/COPD:she was switched to Trelegy from [place]. She is concerned about cost, she also states Trelegy did not help as much, so she asked to go back on Breo without Incruse on her last visit. She states she has daily cough with white sputum, also has SOB with activity and wheezing daily She uses albuterol and it helps her within 5 minutes. She does not want to go back to Trelegy ( it caused her to feel worse )  but would like to continue Breo . She noticed increase in cough that is wet and sputum is clear or light white. No fever or chills. Discussed flare and prednisone but she prefers holding off on that. Discussed Mucinex. She is taking some natural supplements only  Atherosclerosis aorta: refuses statin therapyalso not taking aspirin. Reviewed PET scan from 11/06/2019 Unchanged   Osteoporosis: discussed options, shewas seen by Dr Gabriel Carina 03/2018 and was given Alendronate but she states never started medication, she states she has a high calcium diet and some otc supplementations. She is afraid of side effects , does not like adding medications. Unchanged   Chronic Pancreatitis: found on  CT abdomen and also on PET scan done on 11/06/2019 , denies nausea, vomiting or indigestion. No problems   Aneurysm of ascending aorta: found on CT 2019, but not on CT 2020 and 2021, discussed CTA or MRA, but she is not interested on follow up    Patient Active Problem List   Diagnosis Date Noted  . Pain in limb 03/29/2017  . Basal cell carcinoma 03/29/2017  . Thoracic aortic aneurysm without rupture ([place]) 10/31/2016  . Atherosclerosis of aorta ([place]) 10/31/2016  . Chronic pancreatitis ([place]) 10/31/2016  . Pulmonary nodule 10/31/2016  .  Centrilobular emphysema ([place]) 10/31/2016  . Iron deficiency anemia due to chronic blood loss 10/24/2016  . Basal cell carcinoma, leg, right 08/24/2016  . Hyperglycemia 08/01/2016  . Chronic thoracic back pain 08/04/2015  . Panic attack 08/04/2015  . White coat syndrome without diagnosis of hypertension 08/04/2015  . Major depression, recurrent ([place]) 08/04/2015  . Asthma, moderate persistent, poorly-controlled 08/04/2015    Past Surgical History:  Procedure Laterality Date  . BASAL CELL CARCINOMA EXCISION Right 03/29/2017   Procedure: EXCISION OF RIGHT LEG BASEL CELL CARCINOMA;  Surgeon: [name], DO;  Location: [facility];  Service: Plastics;  Laterality: Right;  . COLON SURGERY  2002   colostomy for 10 months after a perfurated sigmoid   . COLOSTOMY REVERSAL  2003  . EMBOLIZATION Right 10/12/2016   Procedure: Embolization;  Surgeon: [name], MD;  Location: [facility];  Service: Cardiovascular;  Laterality: Right;  . etopic  4081,4481  . INCISION AND DRAINAGE OF WOUND Right 03/30/2017   Procedure: IRRIGATION AND DEBRIDEMENT WOUND; VAC PLACEMENT;  Surgeon: [name], DO;  Location: [facility];  Service: Plastics;  Laterality: Right;  . TONSILLECTOMY      Family History  Problem Relation Age of Onset  . Dementia Mother   . Aortic stenosis Mother   . Osteoporosis Mother   . Dementia Father   . Diabetes Father   . Hyperlipidemia Father   . Hypertension Father   . Aneurysm Sister   . Hyperlipidemia Sister   . Hypertension Sister   . Diabetes Sister   . Breast cancer Neg Hx     Social History   Tobacco Use  . Smoking status: Former Smoker    Packs/day: 0.50    Years: 25.00    Pack years: 12.50    Types: Cigarettes    Start date: 09/05/1972    Quit date: 08/03/1998    Years since quitting: 22.1  . Smokeless tobacco: Never Used  . Tobacco comment: smoking cessation materials not required  Substance Use Topics  . Alcohol use: No     Alcohol/week: 0.0 standard drinks     Current Outpatient Medications:  .  albuterol (VENTOLIN HFA) 108 (90 Base) MCG/ACT inhaler, TAKE 2 PUFFS BY MOUTH EVERY 6 HOURS AS NEEDED FOR WHEEZE OR SHORTNESS OF BREATH, Disp: 18 g, Rfl: 0 .  amLODipine-valsartan (EXFORGE) 5-160 MG tablet, Take 1 tablet by mouth daily., Disp: 90 tablet, Rfl: 0 .  BREO ELLIPTA 200-25 MCG/INH AEPB, TAKE 1 PUFF BY MOUTH EVERY DAY, Disp: 60 each, Rfl: 2 .  Cholecalciferol (VITAMIN D3) 250 MCG (10000 UT) TABS, Take 1 tablet by mouth 2 (two) times a week., Disp: , Rfl:  .  Coenzyme Q10 (COQ10) 400 MG CAPS, Take 400 mg by mouth 2 (two) times daily. , Disp: , Rfl:  .  diazepam (VALIUM) 5 MG tablet, Take 1 tablet (5 mg total) by mouth  daily as needed for anxiety. 30 minutes before doctor's visit, Disp: 30 tablet, Rfl: 0 .  montelukast (SINGULAIR) 10 MG tablet, Take 1 tablet (10 mg total) by mouth at bedtime., Disp: 90 tablet, Rfl: 1 .  OVER THE COUNTER MEDICATION, Take 1 tablet by mouth 2 (two) times daily. Nutri-Calm, Disp: , Rfl:  .  OVER THE COUNTER MEDICATION, Life extension bone strength / life extension skin restore ceramide / life extension super bio curcumin, Disp: , Rfl:  .  Ginkgo Biloba Extract 60 MG CAPS, Take 60 mg by mouth as needed.  (Patient not taking: Reported on 10/06/2020), Disp: , Rfl:  .  levalbuterol (XOPENEX HFA) 45 MCG/ACT inhaler, Inhale 2 puffs into the lungs every 4 (four) hours as needed for wheezing. (Patient not taking: No sig reported), Disp: 1 each, Rfl: 0 .  Multiple Vitamin (MULTIVITAMIN) capsule, Take 2 capsules by mouth daily.  (Patient not taking: No sig reported), Disp: , Rfl:   Allergies  Allergen Reactions  . Penicillins Anaphylaxis    Has patient had a PCN reaction causing immediate rash, facial/tongue/throat swelling, SOB or lightheadedness with hypotension: Yes Has patient had a PCN reaction causing severe rash involving mucus membranes or skin necrosis: No Has patient had a PCN reaction  that required hospitalization: No Has patient had a PCN reaction occurring within the last 10 years: No If all of the above answers are "NO", then may proceed with Cephalosporin use.   . Codeine Nausea And Vomiting  . Latex Itching  . Sulfa Antibiotics Other (See Comments)    TOPICAL-SULFA CAUSED BLISTERS  . Tape Other (See Comments)    Reaction:  Blisters and burning  Pt states that paper tape is okay.    [address] [facility]] Anxiety    I personally reviewed active problem list, medication list, allergies, family history, social history, health maintenance with the patient/caregiver today.   ROS  Constitutional: Negative for fever or weight change.  Respiratory: positive  For productive  cough and shortness of breath.   Cardiovascular: Negative for chest pain or palpitations.  Gastrointestinal: Negative for abdominal pain, no bowel changes.  Musculoskeletal: Negative for gait problem or joint swelling.  Skin: Negative for rash.  Neurological: Negative for dizziness or headache.  No other specific complaints in a complete review of systems (except as listed in HPI above).  Objective  Vitals:   10/06/20 1036 10/06/20 1051  BP: (!) 158/92 (!) 160/98  Pulse: (!) 108   Resp: 16   Temp: 98.2 F (36.8 C)   TempSrc: Oral   SpO2: 98%   Weight: 118 lb 14.4 oz (53.9 kg)   Height: 5\' 3"  (1.6 m)     Body mass index is 21.06 kg/m.  Physical Exam  Constitutional: Patient appears well-developed and well-nourished. No distress.  HEENT: head atraumatic, normocephalic, pupils equal and reactive to light, neck supple Cardiovascular: Normal rate, regular rhythm and normal heart sounds.  No murmur heard. No BLE edema. Pulmonary/Chest: Some tachypnea, rhonchi on auscultation  Abdominal: Soft.  There is no tenderness. Psychiatric: Patient has a normal mood and affect. behavior is normal. Judgment and thought content normal.  PHQ2/9: Depression screen [facility] 2/9 10/06/2020  08/18/2020 04/03/2020 02/13/2020 07/02/2019  Decreased Interest 0 0 0 0 0  Down, Depressed, Hopeless 0 0 0 0 0  PHQ - 2 Score 0 0 0 0 0  Altered sleeping 0 - - 1 -  Tired, decreased energy 0 - - 1 -  Change in appetite 0 - -  0 -  Feeling bad or failure about yourself  0 - - 0 -  Trouble concentrating 0 - - 0 -  Moving slowly or fidgety/restless 0 - - 0 -  Suicidal thoughts 0 - - 0 -  PHQ-9 Score 0 - - 2 -  Difficult doing work/chores - - - Not difficult at all -    phq 9 is negative   Fall Risk: Fall Risk  10/06/2020 08/18/2020 05/19/2020 02/13/2020 07/02/2019  Falls in the past year? 0 0 0 0 0  Comment - - - - -  Number falls in past yr: 0 0 0 0 0  Injury with Fall? 0 0 0 0 0  Risk for fall due to : - No Fall Risks - - -  Follow up - Falls prevention discussed - - Falls prevention discussed      Functional Status Survey: Is the patient deaf or have difficulty hearing?: No Does the patient have difficulty seeing, even when wearing glasses/contacts?: No Does the patient have difficulty concentrating, remembering, or making decisions?: No Does the patient have difficulty walking or climbing stairs?: No Does the patient have difficulty dressing or bathing?: No Does the patient have difficulty doing errands alone such as visiting a doctor's office or shopping?: No    Assessment & Plan  1. Uncontrolled hypertension  - valsartan (DIOVAN) 160 MG tablet; Take 1 tablet (160 mg total) by mouth daily.  Dispense: 90 tablet; Refill: 1 - amLODipine (NORVASC) 5 MG tablet; Take 1 tablet (5 mg total) by mouth every evening.  Dispense: 90 tablet; Refill: 1  2. Thoracic aortic aneurysm without rupture ([place])   3. Asthma, moderate persistent, poorly-controlled   4. Dyslipidemia   5. Centrilobular emphysema (H[facility])  - albuterol (VENTOLIN HFA) 108 (90 Base) MCG/ACT inhaler; Inhale 2 puffs into the lungs every 6 (six) hours as needed for wheezing or shortness of breath. Patient requests  generic Ventolin  Dispense: 18 g; Refill: 0  6. History of basal cell carcinoma (BCC) excision   7. Age-related osteoporosis without current pathological fracture   8. COPD with asthma ([place])  - albuterol (VENTOLIN HFA) 108 (90 Base) MCG/ACT inhaler; Inhale 2 puffs into the lungs every 6 (six) hours as needed for wheezing or shortness of breath. Patient requests generic Ventolin  Dispense: 18 g; Refill: 0  9. White coat syndrome with hypertension   10. COPD exacerbation ([facility])  - benzonatate (TESSALON) 100 MG capsule; Take 1-2 capsules (100-200 mg total) by mouth 2 (two) times daily as needed.  Dispense: 40 capsule; Refill: 0  11. Pulmonary nodules  Having CT soon

## 2020-10-21 ENCOUNTER — Telehealth: Payer: Self-pay

## 2020-11-04 ENCOUNTER — Telehealth: Payer: Self-pay

## 2020-11-04 NOTE — Progress Notes (Signed)
Left voice message to confirmed patient telephone appointment on 11/05/2020 for CCM at 1:00 PM with [name] the Clinical pharmacist.   [place] Pharmacist Assistant [phone]

## 2020-11-05 ENCOUNTER — Ambulatory Visit (INDEPENDENT_AMBULATORY_CARE_PROVIDER_SITE_OTHER): Payer: Medicare HMO

## 2020-11-05 DIAGNOSIS — J4540 Moderate persistent asthma, uncomplicated: Secondary | ICD-10-CM | POA: Diagnosis not present

## 2020-11-05 DIAGNOSIS — E785 Hyperlipidemia, unspecified: Secondary | ICD-10-CM | POA: Diagnosis not present

## 2020-11-05 DIAGNOSIS — I10 Essential (primary) hypertension: Secondary | ICD-10-CM

## 2020-11-05 DIAGNOSIS — M810 Age-related osteoporosis without current pathological fracture: Secondary | ICD-10-CM

## 2020-11-05 NOTE — Progress Notes (Signed)
Chronic Care Management Pharmacy Note  11/10/2020 Name:  Patricia Galloway MRN:  [id number] DOB:  December 26, 1950  Subjective: Patricia Galloway is an 70 y.o. year old female who is a primary patient of [name], MD.  The CCM team was consulted for assistance with disease management and care coordination needs.    Engaged with patient by telephone for follow up visit in response to provider referral for pharmacy case management and/or care coordination services.   Consent to Services:  The patient was given the following information about Chronic Care Management services today, agreed to services, and gave verbal consent: 1. CCM service includes personalized support from designated clinical staff supervised by the primary care provider, including individualized plan of care and coordination with other care providers 2. 24/7 contact phone numbers for assistance for urgent and routine care needs. 3. Service will only be billed when office clinical staff spend 20 minutes or more in a month to coordinate care. 4. Only one practitioner may furnish and bill the service in a calendar month. 5.The patient may stop CCM services at any time (effective at the end of the month) by phone call to the office staff. 6. The patient will be responsible for cost sharing (co-pay) of up to 20% of the service fee (after annual deductible is met). Patient agreed to services and consent obtained.  Patient Care Team: [name], MD as PCP - General (Family Medicine) Lloyd Huger, MD as Consulting Physician (Oncology) Dillingham, Loel Lofty, DO as Attending Physician (Plastic Surgery) Neldon Labella, RN as Case Manager Germaine Pomfret, [facility] as Pharmacist (Pharmacist)  Recent office visits: 10/06/20: Patient presented to Dr. Ancil Boozer for follow-up. Adherence issues with exforge, switched to amlodipine 5 mg nightly, valsartan 160 mg AM due to dizziness.   Recent consult visits: None in previous 6  months  Hospital visits: None in previous 6 months  Objective:  Lab Results  Component Value Date   CREATININE 0.60 11/06/2020   BUN 14 02/13/2020   GFRNONAA 81 02/13/2020   GFRAA 93 02/13/2020   NA 137 02/13/2020   K 4.1 02/13/2020   CALCIUM 9.9 02/13/2020   CO2 29 02/13/2020    Lab Results  Component Value Date/Time   HGBA1C 5.2 02/13/2020 11:45 AM   HGBA1C 5.3 02/12/2019 10:08 AM   HGBA1C 5.9 08/18/2015 12:00 AM   MICROALBUR 0.7 02/13/2020 11:45 AM    Last diabetic Eye exam: No results found for: HMDIABEYEEXA  Last diabetic Foot exam: No results found for: HMDIABFOOTEX   Lab Results  Component Value Date   CHOL 274 (H) 02/13/2020   HDL 97 02/13/2020   LDLCALC 161 (H) 02/13/2020   TRIG 67 02/13/2020   CHOLHDL 2.8 02/13/2020    Hepatic Function Latest Ref Rng & Units 02/13/2020 02/11/2019 06/28/2018  Total Protein 6.1 - 8.1 g/dL 7.2 7.6 7.1  Albumin 3.5 - 5.0 g/dL - 4.4 4.3  AST 10 - 35 U/L $Remo'19 19 23  'PzGIh$ ALT 6 - 29 U/L $Remo'14 16 16  'ATQQM$ Alk Phosphatase 38 - 126 U/L - 62 61  Total Bilirubin 0.2 - 1.2 mg/dL 0.7 0.8 0.6    Lab Results  Component Value Date/Time   TSH 1.98 02/13/2020 11:45 AM   TSH 2.91 08/18/2015 12:00 AM    CBC Latest Ref Rng & Units 02/13/2020 02/11/2019 06/28/2018  WBC 3.8 - 10.8 Thousand/uL 8.2 8.8 8.1  Hemoglobin 11.7 - 15.5 g/dL 14.8 14.7 13.2  Hematocrit 35.0 - 45.0 % 44.1 43.3 39.1  Platelets  140 - 400 Thousand/uL 268 269 258    No results found for: VD25OH  Clinical ASCVD: No  The 10-year ASCVD risk score Denman George DC Jr., et al., 2013) is: 16.8%   Values used to calculate the score:     Age: 59 years     Sex: Female     Is Non-Hispanic African American: No     Diabetic: No     Tobacco smoker: No     Systolic Blood Pressure: 160 mmHg     Is BP treated: Yes     HDL Cholesterol: 97 mg/dL     Total Cholesterol: 274 mg/dL    Depression screen [facility] 2/9 10/06/2020 08/18/2020 04/03/2020  Decreased Interest 0 0 0  Down, Depressed, Hopeless 0 0 0   PHQ - 2 Score 0 0 0  Altered sleeping 0 - -  Tired, decreased energy 0 - -  Change in appetite 0 - -  Feeling bad or failure about yourself  0 - -  Trouble concentrating 0 - -  Moving slowly or fidgety/restless 0 - -  Suicidal thoughts 0 - -  PHQ-9 Score 0 - -  Difficult doing work/chores - - -    Social History   Tobacco Use  Smoking Status Former Smoker  . Packs/day: 0.50  . Years: 25.00  . Pack years: 12.50  . Types: Cigarettes  . Start date: 09/05/1972  . Quit date: 08/03/1998  . Years since quitting: 22.2  Smokeless Tobacco Never Used  Tobacco Comment   smoking cessation materials not required   BP Readings from Last 3 Encounters:  10/06/20 (!) 160/98  05/19/20 (!) 210/120  02/13/20 (!) 220/110   Pulse Readings from Last 3 Encounters:  10/06/20 (!) 108  05/19/20 97  02/13/20 95   Wt Readings from Last 3 Encounters:  10/06/20 118 lb 14.4 oz (53.9 kg)  08/18/20 121 lb 9.6 oz (55.2 kg)  05/19/20 116 lb 9.6 oz (52.9 kg)    Assessment/Interventions: Review of patient past medical history, allergies, medications, health status, including review of consultants reports, laboratory and other test data, was performed as part of comprehensive evaluation and provision of chronic care management services.   SDOH:  (Social Determinants of Health) assessments and interventions performed: Yes SDOH Interventions   Flowsheet Row Most Recent Value  SDOH Interventions   Financial Strain Interventions Intervention Not Indicated      CCM Care Plan  Allergies  Allergen Reactions  . Penicillins Anaphylaxis    Has patient had a PCN reaction causing immediate rash, facial/tongue/throat swelling, SOB or lightheadedness with hypotension: Yes Has patient had a PCN reaction causing severe rash involving mucus membranes or skin necrosis: No Has patient had a PCN reaction that required hospitalization: No Has patient had a PCN reaction occurring within the last 10 years: No If all  of the above answers are "NO", then may proceed with Cephalosporin use.   . Codeine Nausea And Vomiting  . Latex Itching  . Sulfa Antibiotics Other (See Comments)    TOPICAL-SULFA CAUSED BLISTERS  . Tape Other (See Comments)    Reaction:  Blisters and burning  Pt states that paper tape is okay.    Duncan Dull Hcl] Anxiety    Medications Reviewed Today    Reviewed by Alba Cory, MD (Physician) on 10/06/20 at 1127  Med List Status: <None>  Medication Order Taking? Sig Documenting Provider Last Dose Status Informant  albuterol (VENTOLIN HFA) 108 (90 Base) MCG/ACT inhaler [id number] Yes TAKE  2 PUFFS BY MOUTH EVERY 6 HOURS AS NEEDED FOR WHEEZE OR SHORTNESS OF Lonia Chimera, MD Taking Active         Discontinued 10/06/20 1121 (Discontinued by provider)   [place]air Patter 200-25 MCG/INH AEPB [id number] Yes TAKE 1 PUFF BY MOUTH EVERY DAY [name], MD Taking Active   Cholecalciferol (VITAMIN D3) 250 MCG (10000 UT) TABS [id number] Yes Take 1 tablet by mouth 2 (two) times a week. [provider] Taking Active         Discontinued 10/06/20 1123 (Cost of medication)   diazepam (VALIUM) 5 MG tablet [id number] Yes Take 1 tablet (5 mg total) by mouth daily as needed for anxiety. 30 minutes before doctor's visit [name], MD Taking Active        Patient not taking:      Discontinued 10/06/20 1029 (Error)        Patient not taking:      Discontinued 10/06/20 1121 (Patient Discharge)            Med Note (PAYNE, PORTIA E   Tue Sep 11, 2018 11:43 AM)  Only with radiation exposure       Patient not taking:      Discontinued 10/06/20 1124 (Cost of medication)   montelukast (SINGULAIR) 10 MG tablet [id number] Yes Take 1 tablet (10 mg total) by mouth at bedtime. [name], MD Taking Active   Multiple Vitamin (MULTIVITAMIN) capsule [id number] No Take 2 capsules by mouth daily.   Patient not taking: No sig reported   [provider] Not Taking Active    OVER THE [place] [id number] Yes Take 1 tablet by mouth 2 (two) times daily. Nutri-Calm [provider] Taking Active Self           Med Note Rollene Rotunda, Clois Dupes   Tue Sep 11, 2018 11:43 AM) Only with radiation  OVER THE COUNTER MEDICATION [id number] Yes Life extension bone strength / life extension skin restore ceramide / life extension super bio curcumin [provider] Taking Active           Patient Active Problem List   Diagnosis Date Noted  . Pain in limb 03/29/2017  . Basal cell carcinoma 03/29/2017  . Thoracic aortic aneurysm without rupture ([place]) 10/31/2016  . Atherosclerosis of aorta ([place]) 10/31/2016  . Chronic pancreatitis ([place]) 10/31/2016  . Pulmonary nodule 10/31/2016  . Centrilobular emphysema ([place]) 10/31/2016  . Iron deficiency anemia due to chronic blood loss 10/24/2016  . Basal cell carcinoma, leg, right 08/24/2016  . Hyperglycemia 08/01/2016  . Chronic thoracic back pain 08/04/2015  . Panic attack 08/04/2015  . White coat syndrome without diagnosis of hypertension 08/04/2015  . Major depression, recurrent ([place]) 08/04/2015  . Asthma, moderate persistent, poorly-controlled 08/04/2015    Immunization History  Administered Date(s) Administered  . Tdap 08/08/2016    Conditions to be addressed/monitored:  Hypertension, Hyperlipidemia, Asthma and Osteoporosis  Care Plan : General Pharmacy (Adult)  Updates made by Germaine Pomfret, RPH since 11/10/2020 12:00 AM    Problem: Hypertension, Hyperlipidemia, Asthma and Osteoporosis   Priority: High    Long-Range Goal: Patient-Specific Goal   Start Date: 11/05/2020  Expected End Date: 05/13/2021  This Visit's Progress: On track  Priority: High  Note:   Current Barriers:  . Unable to achieve control of blood pressure   Pharmacist Clinical Goal(s):  Marland Kitchen Over the next 90 days, patient will achieve control of blood pressure as evidenced by BP less than 140/90 through collaboration with  PharmD and  provider.   Interventions: . 1:1 collaboration with [name], MD regarding development and update of comprehensive plan of care as evidenced by provider attestation and co-signature . Inter-disciplinary care team collaboration (see longitudinal plan of care) . Comprehensive medication review performed; medication list updated in electronic medical record  Hypertension (BP goal <140/90) -Uncontrolled -Current treatment: . Amlodipine 5 mg daily  . Valsartan 160 mg daily  -Medications previously tried: NA  -Current home readings: NA -Current dietary habits: Patient does not follow any specific dietary regimen -Current exercise habits: Patient does not follow any specific exercise regimen, and has been busy trying to care for her husband.  -Patient with severe history of white coat hypertension.  -Denies hypotensive/hypertensive symptoms -Educated on Daily salt intake goal < 2300 mg; Exercise goal of 150 minutes per week; Importance of home blood pressure monitoring; -Counseled to monitor BP at home weekly with wrist monitor to see if white coat response improves, document, and provide log at future appointments -Recommended to continue current medication  Hyperlipidemia: (LDL goal < 70) -Uncontrolled -Current treatment: . None -Medications previously tried: None  -Educated on Benefits of statin for ASCVD risk reduction; patient still not willing to think about starting new treatment at this time.  -Counseled on diet and exercise extensively  Asthma(Goal: control symptoms and prevent exacerbations) -Controlled -Current treatment  . Albuterol HFA  . Benzonatate 100 mg 1-2 capsules twice daily  . Breo Ellipta 1 puff daily  . Montelukast 10 mg nightly  -Medications previously tried: NA  -Pulmonary function testing: NA -Exacerbations requiring treatment in last 6 months: None -Patient reports consistent use of maintenance inhaler -Frequency of rescue inhaler use:  sporadically -Counseled on Benefits of consistent maintenance inhaler use -Recommended to continue current medication  Osteoporosis / Osteopenia (Goal improve bone density and prevent fractures) -Uncontrolled -Last DEXA Scan: 10/21/19   T-Score femoral neck: -3.8  T-Score total hip: NA  T-Score lumbar spine: -3.9  T-Score forearm radius: -3.6  10-year probability of major osteoporotic fracture: NA  10-year probability of hip fracture: NA -Patient is a candidate for pharmacologic treatment due to T-Score < -2.5 in femoral neck and T-Score < -2.5 in lumbar spine -Current treatment  . Life extension Ultimate Bone Strength twice daily  . Vitamin D 10,000 units twice weekly  -Medications previously tried: NA  -Patient still hesitant to begin osteoporosis treatment. Discussed in details risks of fracture. -Recommend weight-bearing and muscle strengthening exercises for building and maintaining bone density.  -Recommended to continue current medication  Patient Goals/Self-Care Activities . Over the next 90 days, patient will:  - check blood pressure weekly, document, and provide at future appointments target a minimum of 150 minutes of moderate intensity exercise weekly  Follow Up Plan: Telephone follow up appointment with care management team member scheduled for: 05/12/2021 at 1:00 PM       Medication Assistance: None required.  Patient affirms current coverage meets needs.  Patient's preferred pharmacy is:  [id number] [address], [place] [address] Phone: [phone] Fax: [phone]  Uses pill box? Yes Pt endorses 100% compliance  We discussed: Current pharmacy is preferred with insurance plan and patient is satisfied with pharmacy services Patient decided to: Continue current medication management strategy  Care Plan and Follow Up Patient Decision:  Patient agrees to Care Plan and Follow-up.  Plan: Telephone follow up  appointment with care management team member scheduled for:  05/12/2021 at 1:00 PM  [facility] [phone]

## 2020-11-06 ENCOUNTER — Ambulatory Visit
Admission: RE | Admit: 2020-11-06 | Discharge: 2020-11-06 | Disposition: A | Discharge status: Home / Self Care | Payer: Medicare HMO | Source: Ambulatory Visit | Attending: Oncology | Admitting: Oncology

## 2020-11-06 ENCOUNTER — Other Ambulatory Visit: Payer: Self-pay

## 2020-11-06 DIAGNOSIS — Z85828 Personal history of other malignant neoplasm of skin: Secondary | ICD-10-CM | POA: Diagnosis not present

## 2020-11-06 DIAGNOSIS — C44712 Basal cell carcinoma of skin of right lower limb, including hip: Secondary | ICD-10-CM | POA: Insufficient documentation

## 2020-11-06 DIAGNOSIS — J432 Centrilobular emphysema: Secondary | ICD-10-CM | POA: Diagnosis not present

## 2020-11-06 DIAGNOSIS — J438 Other emphysema: Secondary | ICD-10-CM | POA: Diagnosis not present

## 2020-11-06 DIAGNOSIS — I2510 Atherosclerotic heart disease of native coronary artery without angina pectoris: Secondary | ICD-10-CM | POA: Diagnosis not present

## 2020-11-06 LAB — POCT I-STAT CREATININE: Creatinine, Ser: 0.6 mg/dL (ref 0.44–1.00)

## 2020-11-06 MED ORDER — IOHEXOL 300 MG/ML  SOLN
75.0000 mL | Freq: Once | INTRAMUSCULAR | Status: AC | PRN
  Administered 2020-11-06: 75 mL via INTRAVENOUS

## 2020-11-07 NOTE — Progress Notes (Unsigned)
[place]  [phone] [phone]  ID: Patricia Galloway OB: Jan 22, 1951  MR#: [id number]  UKG#:[id number]  Patient Care Team: [name], MD as PCP - General (Family Medicine) Lloyd Huger, MD as Consulting Physician (Oncology) Dillingham, Loel Lofty, DO as Attending Physician (Plastic Surgery) Neldon Labella, RN as Case Manager Germaine Pomfret, [facility] [place] as Pharmacist (Pharmacist)  I connected with Patricia Galloway on 11/11/20 at  2:00 PM EST by video enabled telemedicine visit and verified that I am speaking with the correct person using two identifiers.   I discussed the limitations, risks, security and privacy concerns of performing an evaluation and management service by telemedicine and the availability of in-person appointments. I also discussed with the patient that there may be a patient responsible charge related to this service. The patient expressed understanding and agreed to proceed.   Other persons participating in the visit and their role in the encounter: Patient, MD.  Patient's location: Home. Provider's location: Clinic.  CHIEF COMPLAINT: Iron deficiency anemia, locally advanced basal cell carcinoma of the right leg, pulmonary nodules..  INTERVAL HISTORY: Patient agreed to video assisted telemedicine visit for her routine yearly evaluation and discussion of her imaging results.  She has some new onset sensation and tenderness in her right leg at the site of her previous malignancy, but otherwise feels well.  She has no neurologic complaints. She denies any recent fevers or illnesses.  She has a good appetite and denies weight loss.  She denies any chest pain, shortness of breath, cough, or hemoptysis.  She denies any nausea, vomiting, constipation, or diarrhea.  She has no melena or hematochezia.  She has no urinary complaints.  Patient offers no further specific complaints today.  REVIEW OF SYSTEMS:   Review of Systems   Constitutional: Negative for fever, malaise/fatigue and weight loss.  Respiratory: Negative.  Negative for cough and shortness of breath.   Cardiovascular: Negative.  Negative for chest pain and leg swelling.  Gastrointestinal: Negative.  Negative for abdominal pain, nausea and vomiting.  Genitourinary: Negative.  Negative for dysuria.  Musculoskeletal: Negative.  Negative for myalgias.  Skin: Negative.  Negative for rash.  Neurological: Negative.  Negative for sensory change, focal weakness and weakness.  Psychiatric/Behavioral: Negative.  The patient is not nervous/anxious.     As per HPI. Otherwise, a complete review of systems is negative.  PAST MEDICAL HISTORY: Past Medical History:  Diagnosis Date  . Anemia    has had iron infusion  . Anxiety    complicated grief  . Aortic aneurysm ([place])    found on CT in December, 2017  . Asthma    as child , but no episodes in over 30 years  . Basal cell carcinoma   . Cancer ([place]) 08/2016   basal cell carcinoma - right leg  . History of transfusion of packed red blood cells   . Personal history of chemotherapy 2018   basal cell     PAST SURGICAL HISTORY: Past Surgical History:  Procedure Laterality Date  . BASAL CELL CARCINOMA EXCISION Right 03/29/2017   Procedure: EXCISION OF RIGHT LEG BASEL CELL CARCINOMA;  Surgeon: [name], DO;  Location: [place];  Service: Plastics;  Laterality: Right;  . COLON SURGERY  2002   colostomy for 10 months after a perfurated sigmoid   . COLOSTOMY REVERSAL  2003  . EMBOLIZATION Right 10/12/2016   Procedure: Embolization;  Surgeon: [name], MD;  Location: [place] [facility];  Service: Cardiovascular;  Laterality: Right;  . etopic  0347,4259  . INCISION AND DRAINAGE OF WOUND Right 03/30/2017   Procedure: IRRIGATION AND DEBRIDEMENT WOUND; VAC PLACEMENT;  Surgeon: [name], DO;  Location: [facility];  Service: Plastics;  Laterality: Right;  . TONSILLECTOMY      FAMILY  HISTORY: Family History  Problem Relation Age of Onset  . Dementia Mother   . Aortic stenosis Mother   . Osteoporosis Mother   . Dementia Father   . Diabetes Father   . Hyperlipidemia Father   . Hypertension Father   . Aneurysm Sister   . Hyperlipidemia Sister   . Hypertension Sister   . Diabetes Sister   . Breast cancer Neg Hx     ADVANCED DIRECTIVES (Y/N):  N  HEALTH MAINTENANCE: Social History   Tobacco Use  . Smoking status: Former Smoker    Packs/day: 0.50    Years: 25.00    Pack years: 12.50    Types: Cigarettes    Start date: 09/05/1972    Quit date: 08/03/1998    Years since quitting: 22.2  . Smokeless tobacco: Never Used  . Tobacco comment: smoking cessation materials not required  Vaping Use  . Vaping Use: Never used  Substance Use Topics  . Alcohol use: No    Alcohol/week: 0.0 standard drinks  . Drug use: No     Colonoscopy:  PAP:  Bone density:  Lipid panel:  Allergies  Allergen Reactions  . Penicillins Anaphylaxis    Has patient had a PCN reaction causing immediate rash, facial/tongue/throat swelling, SOB or lightheadedness with hypotension: Yes Has patient had a PCN reaction causing severe rash involving mucus membranes or skin necrosis: No Has patient had a PCN reaction that required hospitalization: No Has patient had a PCN reaction occurring within the last 10 years: No If all of the above answers are "NO", then may proceed with Cephalosporin use.   . Codeine Nausea And Vomiting  . Latex Itching  . Sulfa Antibiotics Other (See Comments)    TOPICAL-SULFA CAUSED BLISTERS  . Tape Other (See Comments)    Reaction:  Blisters and burning  Pt states that paper tape is okay.    [address] [facility]] Anxiety    Current Outpatient Medications  Medication Sig Dispense Refill  . albuterol (VENTOLIN HFA) 108 (90 Base) MCG/ACT inhaler Inhale 2 puffs into the lungs every 6 (six) hours as needed for wheezing or shortness of breath. Patient  requests generic Ventolin 18 g 0  . amLODipine (NORVASC) 5 MG tablet Take 1 tablet (5 mg total) by mouth every evening. 90 tablet 1  . BREO ELLIPTA 200-25 MCG/INH AEPB TAKE 1 PUFF BY MOUTH EVERY DAY 60 each 2  . Cholecalciferol (VITAMIN D3) 250 MCG (10000 UT) TABS Take 1 tablet by mouth 2 (two) times a week.    . montelukast (SINGULAIR) 10 MG tablet Take 1 tablet (10 mg total) by mouth at bedtime. 90 tablet 1  . OVER THE COUNTER MEDICATION Take 1 tablet by mouth 2 (two) times daily. Nutri-Calm    . OVER THE COUNTER MEDICATION Life extension bone strength / life extension skin restore ceramide / life extension super bio curcumin    . benzonatate (TESSALON) 100 MG capsule Take 1-2 capsules (100-200 mg total) by mouth 2 (two) times daily as needed. (Patient not taking: Reported on 11/10/2020) 40 capsule 0  . diazepam (VALIUM) 5 MG tablet Take 1 tablet (5 mg total) by mouth daily as needed for anxiety. 30 minutes  before doctor's visit (Patient not taking: Reported on 11/10/2020) 30 tablet 0  . Multiple Vitamin (MULTIVITAMIN) capsule Take 2 capsules by mouth daily.  (Patient not taking: No sig reported)    . valsartan (DIOVAN) 160 MG tablet Take 1 tablet (160 mg total) by mouth daily. (Patient not taking: Reported on 11/10/2020) 90 tablet 1   No current facility-administered medications for this visit.    OBJECTIVE: There were no vitals filed for this visit.   There is no height or weight on file to calculate BMI.    ECOG FS:0 - Asymptomatic  General: Well-developed, well-nourished, no acute distress. HEENT: Normocephalic. Neuro: Alert, answering all questions appropriately. Cranial nerves grossly intact. Psych: Normal affect.  LAB RESULTS:  Lab Results  Component Value Date   NA 137 02/13/2020   K 4.1 02/13/2020   CL 99 02/13/2020   CO2 29 02/13/2020   GLUCOSE 94 02/13/2020   BUN 14 02/13/2020   CREATININE 0.60 11/06/2020   CALCIUM 9.9 02/13/2020   PROT 7.2 02/13/2020   ALBUMIN 4.4  02/11/2019   AST 19 02/13/2020   ALT 14 02/13/2020   ALKPHOS 62 02/11/2019   BILITOT 0.7 02/13/2020   GFRNONAA 81 02/13/2020   GFRAA 93 02/13/2020    Lab Results  Component Value Date   WBC 8.2 02/13/2020   NEUTROABS 4,600 02/13/2020   HGB 14.8 02/13/2020   HCT 44.1 02/13/2020   MCV 90.0 02/13/2020   PLT 268 02/13/2020   Lab Results  Component Value Date   IRON 71 01/09/2017   TIBC 225 (L) 01/09/2017   IRONPCTSAT 32 (H) 01/09/2017   Lab Results  Component Value Date   FERRITIN 58 01/09/2017    STUDIES: CT Chest W Contrast  Result Date: 11/07/2020 CLINICAL DATA:  Follow-up lung nodules. History of basal cell carcinoma. EXAM: CT CHEST WITH CONTRAST TECHNIQUE: Multidetector CT imaging of the chest was performed during intravenous contrast administration. CONTRAST:  75mL OMNIPAQUE IOHEXOL 300 MG/ML  SOLN COMPARISON:  PET-CT 11/06/2019 FINDINGS: Cardiovascular: Coronary artery calcification and aortic atherosclerotic calcification. Mediastinum/Nodes: No axillary or supraclavicular adenopathy. No mediastinal or hilar adenopathy. No pericardial fluid. Esophagus normal. Lungs/Pleura: Again demonstrated 2 pulmonary nodules in the LEFT upper lobe. The larger nodule measures 9 mm (image 28/series 3) unchanged from 10 mm. This nodule has central calcification. Second nodule more superior in the LEFT upper lobe measures 6 mm which is unchanged from 7 mm. No new pulmonary nodules. Centrilobular emphysema the upper lobes. Upper Abdomen: Limited view of the liver, kidneys, pancreas are unremarkable. Normal adrenal glands. Musculoskeletal: No aggressive osseous lesion. IMPRESSION: 1. Two LEFT upper lobe pulmonary nodules are stable over 1 year interval. Additionally lesion had very low metabolic activity on comparison PET-CT scan. Findings are consistent with benign pulmonary nodules. 2. Aortic Atherosclerosis (ICD10-I70.0) and Emphysema (ICD10-J43.9). Electronically Signed   By: Suzy Bouchard M.D.    On: 11/07/2020 14:56    ASSESSMENT: Iron deficiency anemia, locally advanced basal cell carcinoma of the right leg.  PLAN:    1. Iron deficiency anemia: Resolved.  Patient last received Feraheme on Jan 28, 2017.     2. Basal cell carcinoma of the right leg: Resolved without evidence of recurrence.  Patient stated this lesion grew for 9 years and she did not seek medical attention. Diagnosis confirmed by biopsy from surgery.  Patient underwent embolization as well as to neoadjuvant treatment with vismodegib (Erivedge)150 mg daily, a hedgehog inhibitor. Patient underwent complete resection of residual malignancy on March 29, 2017.  Previously, adjuvant chemotherapy and XRT was discussed, but given widely clear margins on surgical resections this was determined not to be necessary.  Her most recent PET scan on November 06, 2019 revealed no evidence of recurrent or progressive disease.  Given her new onset symptoms at the site of her malignancy, will repeat PET in 6 weeks with video visit 1 to 2 days later. 3. Hypertension: Chronic and unchanged.  Continue monitoring and treatment per primary care. 4.  Pulmonary nodules: Likely benign.  CT scan results from November 06, 2020 reviewed independently and report as above with stable nodules.  Previously, PET scan results from November 06, 2019 reviewed independently with low level metabolic activity of left upper lobe lesion.  Other pulmonary nodules not reveal any hypermetabolic activity.  No further chest imaging is necessary on a routine basis.  I provided 20 minutes of face-to-face video visit time during this encounter which included chart review, counseling, and coordination of care as documented above.   Patient expressed understanding and was in agreement with this plan. She also understands that She can call clinic at any time with any questions, concerns, or complaints.   Cancer Staging Basal cell carcinoma, leg, right Staging form: Melanoma of the Skin,  AJCC 7th Edition - Clinical: No stage assigned - Unsigned   Lloyd Huger, MD   11/11/2020 6:28 AM

## 2020-11-10 ENCOUNTER — Encounter: Payer: Self-pay | Admitting: Oncology

## 2020-11-10 ENCOUNTER — Telehealth: Payer: Self-pay

## 2020-11-10 ENCOUNTER — Inpatient Hospital Stay: Payer: Medicare HMO | Attending: Oncology | Admitting: Oncology

## 2020-11-10 DIAGNOSIS — C44712 Basal cell carcinoma of skin of right lower limb, including hip: Secondary | ICD-10-CM | POA: Diagnosis not present

## 2020-11-10 NOTE — Progress Notes (Signed)
[facility] to [facility] if they have a prescription for valsartan 160 mg daily.  Per [facility] , prescription is on file and they will fill it and call patient when it is ready for pick up.Notifed Clinical Pharmacist.  Bessie Wineglass Pharmacist Assistant [phone]

## 2020-11-10 NOTE — Patient Instructions (Signed)
Visit Information It was great speaking with you today!  Please let me know if you have any questions about our visit.  Goals Addressed            This Visit's Progress   . Manage My Medicine       Timeframe:  Long-Range Goal Priority:  High Start Date:   11/05/2020                          Expected End Date:  05/13/2021                     Follow Up Date 02/01/2021    - call for medicine refill 2 or 3 days before it runs out - keep a list of all the medicines I take; vitamins and herbals too - use a pillbox to sort medicine    Why is this important?   . These steps will help you keep on track with your medicines.   Notes:        Patient Care Plan: General Pharmacy (Adult)    Problem Identified: Hypertension, Hyperlipidemia, Asthma and Osteoporosis   Priority: High    Long-Range Goal: Patient-Specific Goal   Start Date: 11/05/2020  Expected End Date: 05/13/2021  This Visit's Progress: On track  Priority: High  Note:   Current Barriers:  . Unable to achieve control of blood pressure   Pharmacist Clinical Goal(s):  Marland Kitchen Over the next 90 days, patient will achieve control of blood pressure as evidenced by BP less than 140/90 through collaboration with PharmD and provider.   Interventions: . 1:1 collaboration with [name], MD regarding development and update of comprehensive plan of care as evidenced by provider attestation and co-signature . Inter-disciplinary care team collaboration (see longitudinal plan of care) . Comprehensive medication review performed; medication list updated in electronic medical record  Hypertension (BP goal <140/90) -Uncontrolled -Current treatment: . Amlodipine 5 mg daily  -Medications previously tried: NA  - Patient has not been taking valsartan as pharmacy did not have it ready when she went for refills. [facility] -Current home readings: NA -Current dietary habits: Patient does not follow any  specific dietary regimen -Current exercise habits: Patient does not follow any specific exercise regimen, and has been busy trying to care for her husband.  -Patient with severe history of white coat hypertension.  -Denies hypotensive/hypertensive symptoms -Educated on Daily salt intake goal < 2300 mg; Exercise goal of 150 minutes per week; Importance of home blood pressure monitoring; -Counseled to monitor BP at home weekly with wrist monitor to see if white coat response improves, document, and provide log at future appointments -Recommended to continue current medication  Hyperlipidemia: (LDL goal < 70) -Uncontrolled -Current treatment: . None -Medications previously tried: None  -Educated on Benefits of statin for ASCVD risk reduction; patient still not willing to think about starting new treatment at this time.  -Counseled on diet and exercise extensively  Asthma(Goal: control symptoms and prevent exacerbations) -Controlled -Current treatment  . Albuterol HFA  . Benzonatate 100 mg 1-2 capsules twice daily  . Breo Ellipta 1 puff daily  . Montelukast 10 mg nightly  -Medications previously tried: NA  -Pulmonary function testing: NA -Exacerbations requiring treatment in last 6 months: None -Patient reports consistent use of maintenance inhaler -Frequency of rescue inhaler use: sporadically -Counseled on Benefits of consistent maintenance inhaler use -Recommended to continue current medication  Osteoporosis / Osteopenia (Goal improve bone density and prevent fractures) -Uncontrolled -Last DEXA Scan: 10/21/19   T-Score femoral neck: -3.8  T-Score total hip: NA  T-Score lumbar spine: -3.9  T-Score forearm radius: -3.6  10-year probability of major osteoporotic fracture: NA  10-year probability of hip fracture: NA -Patient is a candidate for pharmacologic treatment due to T-Score < -2.5 in femoral neck and T-Score < -2.5 in lumbar spine -Current treatment  . Life extension  Ultimate Bone Strength twice daily  . Vitamin D 10,000 units twice weekly  -Medications previously tried: NA  -Patient still hesitant to begin osteoporosis treatment. Discussed in details risks of fracture. -Recommend weight-bearing and muscle strengthening exercises for building and maintaining bone density.  -Recommended to continue current medication  Patient Goals/Self-Care Activities . Over the next 90 days, patient will:  - check blood pressure weekly, document, and provide at future appointments target a minimum of 150 minutes of moderate intensity exercise weekly  Follow Up Plan: Telephone follow up appointment with care management team member scheduled for: 05/12/2021 at 1:00 PM       Patient agreed to services and verbal consent obtained.   The patient verbalized understanding of instructions, educational materials, and care plan provided today and declined offer to receive copy of patient instructions, educational materials, and care plan.   [facility] [phone]

## 2020-11-11 ENCOUNTER — Telehealth: Payer: Self-pay

## 2020-11-11 NOTE — Telephone Encounter (Signed)
  Chronic Care Management   Outreach Note  11/11/2020 Name: Patricia Galloway MRN: [id number] DOB: [birth year]  Primary Care Provider: [name], MD Reason for referral : Chronic Care Management   An unsuccessful telephone outreach was attempted today. [name] is currently enrolled in the chronic care management program.     Follow Up Plan:  A HIPAA compliant voice message was left today requesting a return call.    Cristy [facility] Care [facility] [phone]

## 2020-11-23 ENCOUNTER — Ambulatory Visit: Payer: Medicare HMO | Admitting: Family Medicine

## 2020-12-03 ENCOUNTER — Other Ambulatory Visit: Payer: Self-pay | Admitting: Family Medicine

## 2020-12-03 DIAGNOSIS — J4540 Moderate persistent asthma, uncomplicated: Secondary | ICD-10-CM

## 2020-12-03 DIAGNOSIS — J449 Chronic obstructive pulmonary disease, unspecified: Secondary | ICD-10-CM

## 2020-12-03 NOTE — Telephone Encounter (Signed)
Requested Prescriptions  Pending Prescriptions Disp Refills  . BREO ELLIPTA 200-25 MCG/INH AEPB [Pharmacy Med Name: BREO ELLIPTA 200-25 MCG INH] 60 each 2    Sig: INHALE 1 PUFF BY MOUTH EVERY DAY     Pulmonology:  [employer] Passed - 12/03/2020  1:55 AM      Passed - Valid encounter within last 12 months    Recent Outpatient Visits          1 month ago Thoracic aortic aneurysm without rupture [facility])   [facility] [name], MD   6 months ago Thoracic aortic aneurysm without rupture [facility])   [facility] [name], MD   9 months ago Centrilobular emphysema [facility])   [facility] [name], MD   1 year ago Thoracic aortic aneurysm without rupture [facility])   [facility] [name], MD   2 years ago Elevated blood pressure reading in office with white coat syndrome, with diagnosis of hypertension   [facility] [name], MD      Future Appointments            In 2 weeks Grayland Ormond, Kathlene November, MD [facility]   In 2 months  [facility], [place]   In 4 months [name], MD [facility], [place]   In 8 months  [facility], [facility]

## 2020-12-09 ENCOUNTER — Telehealth: Payer: Self-pay

## 2020-12-09 NOTE — Progress Notes (Signed)
    Chronic Care Management Pharmacy Assistant   Name: Patricia Galloway  MRN: [id number] DOB: 05-05-1951  Reason for [place] Call.   Recent office visits:  No recent office visit  Recent consult visits:  11/10/2020 [facility] visits:  None in previous 6 months  Medications: Outpatient Encounter Medications as of 12/09/2020  Medication Sig Note  . albuterol (VENTOLIN HFA) 108 (90 Base) MCG/ACT inhaler Inhale 2 puffs into the lungs every 6 (six) hours as needed for wheezing or shortness of breath. Patient requests generic Ventolin   . amLODipine (NORVASC) 5 MG tablet Take 1 tablet (5 mg total) by mouth every evening.   . benzonatate (TESSALON) 100 MG capsule Take 1-2 capsules (100-200 mg total) by mouth 2 (two) times daily as needed. (Patient not taking: Reported on 11/10/2020)   . BREO ELLIPTA 200-25 MCG/INH AEPB INHALE 1 PUFF BY MOUTH EVERY DAY   . Cholecalciferol (VITAMIN D3) 250 MCG (10000 UT) TABS Take 1 tablet by mouth 2 (two) times a week.   . diazepam (VALIUM) 5 MG tablet Take 1 tablet (5 mg total) by mouth daily as needed for anxiety. 30 minutes before doctor's visit (Patient not taking: Reported on 11/10/2020)   . montelukast (SINGULAIR) 10 MG tablet Take 1 tablet (10 mg total) by mouth at bedtime.   . Multiple Vitamin (MULTIVITAMIN) capsule Take 2 capsules by mouth daily.  (Patient not taking: No sig reported)   . OVER THE COUNTER MEDICATION Take 1 tablet by mouth 2 (two) times daily. Nutri-Calm 09/11/2018: Only with radiation  . OVER THE COUNTER MEDICATION Life extension bone strength / life extension skin restore ceramide / life extension super bio curcumin   . valsartan (DIOVAN) 160 MG tablet Take 1 tablet (160 mg total) by mouth daily. (Patient not taking: Reported on 11/10/2020)    No facility-administered encounter medications on file as of 12/09/2020.    Star Rating Drugs: . Valsartan 160 mg last filled on 11/10/2020 [facility]   . Current Asthma regimen:   Albuterol HFA   Benzonatate 100 mg 1-2 capsules twice daily   Breo Ellipta 1 puff daily   Montelukast 10 mg nightly  . No flowsheet data found. . Any recent hospitalizations or ED visits since last visit with CPP? No . Denies Asthma symptoms, including Increased shortness of breath , Rescue medicine is not helping, Shortness of breath at rest, Symptoms worse with exercise, Symptoms worse at night and Wheezing . What recent interventions/DTPs have been made by any provider to improve breathing since last visit:None ID  . Have you had exacerbation/flare-up since last visit? No . What do you do when you are short of breath?  Rescue medication  Respiratory Devices/Equipment . Do you have a nebulizer? Yes  o Patient states she has never use her nebulizer. . Do you use a Peak Flow Meter? No . Do you use a maintenance inhaler? Yes . How often do you forget to use your daily inhaler? Never . Do you use a rescue inhaler? Yes . How often do you use your rescue inhaler?  prn . Do you use a spacer with your inhaler? No  Adherence Review: . Does the patient have >5 day gap between last estimated fill date for maintenance inhaler medications? No  Anderson [place] Clinical Production designer, theatre/television/film [phone]

## 2020-12-17 ENCOUNTER — Ambulatory Visit
Admission: RE | Admit: 2020-12-17 | Discharge: 2020-12-17 | Disposition: A | Discharge status: Home / Self Care | Payer: Medicare HMO | Source: Ambulatory Visit | Attending: Oncology | Admitting: Oncology

## 2020-12-17 ENCOUNTER — Other Ambulatory Visit: Payer: Self-pay

## 2020-12-17 DIAGNOSIS — I700 Atherosclerosis of aorta: Secondary | ICD-10-CM | POA: Diagnosis not present

## 2020-12-17 DIAGNOSIS — C44712 Basal cell carcinoma of skin of right lower limb, including hip: Secondary | ICD-10-CM | POA: Diagnosis not present

## 2020-12-17 DIAGNOSIS — K861 Other chronic pancreatitis: Secondary | ICD-10-CM | POA: Diagnosis not present

## 2020-12-17 DIAGNOSIS — I2510 Atherosclerotic heart disease of native coronary artery without angina pectoris: Secondary | ICD-10-CM | POA: Insufficient documentation

## 2020-12-17 DIAGNOSIS — J439 Emphysema, unspecified: Secondary | ICD-10-CM | POA: Insufficient documentation

## 2020-12-17 DIAGNOSIS — C4491 Basal cell carcinoma of skin, unspecified: Secondary | ICD-10-CM | POA: Diagnosis not present

## 2020-12-17 DIAGNOSIS — R918 Other nonspecific abnormal finding of lung field: Secondary | ICD-10-CM | POA: Diagnosis not present

## 2020-12-17 DIAGNOSIS — K859 Acute pancreatitis without necrosis or infection, unspecified: Secondary | ICD-10-CM | POA: Diagnosis not present

## 2020-12-17 LAB — GLUCOSE, CAPILLARY: Glucose-Capillary: 123 mg/dL — ABNORMAL HIGH (ref 70–99)

## 2020-12-17 MED ORDER — FLUDEOXYGLUCOSE F - 18 (FDG) INJECTION
6.1000 | Freq: Once | INTRAVENOUS | Status: AC | PRN
  Administered 2020-12-17: 6.262 via INTRAVENOUS

## 2020-12-20 NOTE — Progress Notes (Signed)
[facility]  [phone] [phone]) [phone]  ID: Patricia Galloway OB: [birth year]  MR#: [id number]  OIT#:[id number]  Patient Care Team: [name], MD as PCP - General (Family Medicine) Lloyd Huger, MD as Consulting Physician (Oncology) Dillingham, Loel Lofty, DO as Attending Physician (Plastic Surgery) Neldon Labella, RN as Case Manager Germaine Pomfret, [facility] as Pharmacist (Pharmacist)  I connected with Patricia Galloway on 12/23/20 at  3:30 PM EDT by video enabled telemedicine visit and verified that I am speaking with the correct person using two identifiers.   I discussed the limitations, risks, security and privacy concerns of performing an evaluation and management service by telemedicine and the availability of in-person appointments. I also discussed with the patient that there may be a patient responsible charge related to this service. The patient expressed understanding and agreed to proceed.   Other persons participating in the visit and their role in the encounter: Patient, MD.  Patient's location: Home. Provider's location: Clinic.   CHIEF COMPLAINT: Iron deficiency anemia, locally advanced basal cell carcinoma of the right leg, pulmonary nodules..  INTERVAL HISTORY: Patient agreed to video assisted telemedicine visit for further evaluation and discussion of her PET scan results.  Unfortunately, secondary to technical difficulties visit was completed via telephone.  She continues to have sensation and tenderness in her right leg at the site of her previous malignancy, but otherwise feels well.  She has no neurologic complaints. She denies any recent fevers or illnesses.  She has a good appetite and denies weight loss.  She denies any chest pain, shortness of breath, cough, or hemoptysis.  She denies any nausea, vomiting, constipation, or diarrhea.  She has no melena or hematochezia.  She has no urinary complaints.  Patient offers no  further specific complaints today.  REVIEW OF SYSTEMS:   Review of Systems  Constitutional: Negative for fever, malaise/fatigue and weight loss.  Respiratory: Negative.  Negative for cough and shortness of breath.   Cardiovascular: Negative.  Negative for chest pain and leg swelling.  Gastrointestinal: Negative.  Negative for abdominal pain, nausea and vomiting.  Genitourinary: Negative.  Negative for dysuria.  Musculoskeletal: Negative.  Negative for myalgias.  Skin: Negative.  Negative for rash.  Neurological: Negative.  Negative for sensory change, focal weakness and weakness.  Psychiatric/Behavioral: Negative.  The patient is not nervous/anxious.     As per HPI. Otherwise, a complete review of systems is negative.  PAST MEDICAL HISTORY: Past Medical History:  Diagnosis Date  . Anemia    has had iron infusion  . Anxiety    complicated grief  . Aortic aneurysm ([place])    found on CT in December, 2017  . Asthma    as child , but no episodes in over 30 years  . Basal cell carcinoma   . Cancer ([place]) 08/2016   basal cell carcinoma - right leg  . History of transfusion of packed red blood cells   . Personal history of chemotherapy 2018   basal cell     PAST SURGICAL HISTORY: Past Surgical History:  Procedure Laterality Date  . BASAL CELL CARCINOMA EXCISION Right 03/29/2017   Procedure: EXCISION OF RIGHT LEG BASEL CELL CARCINOMA;  Surgeon: [name], DO;  Location: [facility];  Service: Plastics;  Laterality: Right;  . COLON SURGERY  2002   colostomy for 10 months after a perfurated sigmoid   . COLOSTOMY REVERSAL  2003  . EMBOLIZATION Right 10/12/2016   Procedure: Embolization;  Surgeon: [name],  MD;  Location: [facility];  Service: Cardiovascular;  Laterality: Right;  . etopic  4098,1191  . INCISION AND DRAINAGE OF WOUND Right 03/30/2017   Procedure: IRRIGATION AND DEBRIDEMENT WOUND; VAC PLACEMENT;  Surgeon: [name], DO;  Location: [facility];  Service: Plastics;  Laterality: Right;  . TONSILLECTOMY      FAMILY HISTORY: Family History  Problem Relation Age of Onset  . Dementia Mother   . Aortic stenosis Mother   . Osteoporosis Mother   . Dementia Father   . Diabetes Father   . Hyperlipidemia Father   . Hypertension Father   . Aneurysm Sister   . Hyperlipidemia Sister   . Hypertension Sister   . Diabetes Sister   . Breast cancer Neg Hx     ADVANCED DIRECTIVES (Y/N):  N  HEALTH MAINTENANCE: Social History   Tobacco Use  . Smoking status: Former Smoker    Packs/day: 0.50    Years: 25.00    Pack years: 12.50    Types: Cigarettes    Start date: 09/05/1972    Quit date: 08/03/1998    Years since quitting: 22.4  . Smokeless tobacco: Never Used  . Tobacco comment: smoking cessation materials not required  Vaping Use  . Vaping Use: Never used  Substance Use Topics  . Alcohol use: No    Alcohol/week: 0.0 standard drinks  . Drug use: No     Colonoscopy:  PAP:  Bone density:  Lipid panel:  Allergies  Allergen Reactions  . Penicillins Anaphylaxis    Has patient had a PCN reaction causing immediate rash, facial/tongue/throat swelling, SOB or lightheadedness with hypotension: Yes Has patient had a PCN reaction causing severe rash involving mucus membranes or skin necrosis: No Has patient had a PCN reaction that required hospitalization: No Has patient had a PCN reaction occurring within the last 10 years: No If all of the above answers are "NO", then may proceed with Cephalosporin use.   . Codeine Nausea And Vomiting  . Latex Itching  . Sulfa Antibiotics Other (See Comments)    TOPICAL-SULFA CAUSED BLISTERS  . Tape Other (See Comments)    Reaction:  Blisters and burning  Pt states that paper tape is okay.    [address] [facility]] Anxiety    Current Outpatient Medications  Medication Sig Dispense Refill  . albuterol (VENTOLIN HFA) 108 (90 Base) MCG/ACT inhaler Inhale 2 puffs into the lungs  every 6 (six) hours as needed for wheezing or shortness of breath. Patient requests generic Ventolin 18 g 0  . amLODipine (NORVASC) 5 MG tablet Take 1 tablet (5 mg total) by mouth every evening. 90 tablet 1  . benzonatate (TESSALON) 100 MG capsule Take 1-2 capsules (100-200 mg total) by mouth 2 (two) times daily as needed. 40 capsule 0  . BREO ELLIPTA 200-25 MCG/INH AEPB INHALE 1 PUFF BY MOUTH EVERY DAY 60 each 2  . Cholecalciferol (VITAMIN D3) 250 MCG (10000 UT) TABS Take 1 tablet by mouth 2 (two) times a week.    . diazepam (VALIUM) 5 MG tablet Take 1 tablet (5 mg total) by mouth daily as needed for anxiety. 30 minutes before doctor's visit 30 tablet 0  . montelukast (SINGULAIR) 10 MG tablet Take 1 tablet (10 mg total) by mouth at bedtime. 90 tablet 1  . Multiple Vitamin (MULTIVITAMIN) capsule Take 2 capsules by mouth daily.    Marland Kitchen OVER THE COUNTER MEDICATION Take 1 tablet by mouth 2 (two) times daily. Nutri-Calm    .  OVER THE COUNTER MEDICATION Life extension bone strength / life extension skin restore ceramide / life extension super bio curcumin    . valsartan (DIOVAN) 160 MG tablet Take 1 tablet (160 mg total) by mouth daily. (Patient not taking: Reported on 11/10/2020) 90 tablet 1   No current facility-administered medications for this visit.    OBJECTIVE: There were no vitals filed for this visit.   There is no height or weight on file to calculate BMI.    ECOG FS:0 - Asymptomatic  LAB RESULTS:  Lab Results  Component Value Date   NA 137 02/13/2020   K 4.1 02/13/2020   CL 99 02/13/2020   CO2 29 02/13/2020   GLUCOSE 94 02/13/2020   BUN 14 02/13/2020   CREATININE 0.60 11/06/2020   CALCIUM 9.9 02/13/2020   PROT 7.2 02/13/2020   ALBUMIN 4.4 02/11/2019   AST 19 02/13/2020   ALT 14 02/13/2020   ALKPHOS 62 02/11/2019   BILITOT 0.7 02/13/2020   GFRNONAA 81 02/13/2020   GFRAA 93 02/13/2020    Lab Results  Component Value Date   WBC 8.2 02/13/2020   NEUTROABS 4,600 02/13/2020    HGB 14.8 02/13/2020   HCT 44.1 02/13/2020   MCV 90.0 02/13/2020   PLT 268 02/13/2020   Lab Results  Component Value Date   IRON 71 01/09/2017   TIBC 225 (L) 01/09/2017   IRONPCTSAT 32 (H) 01/09/2017   Lab Results  Component Value Date   FERRITIN 58 01/09/2017    STUDIES: NM PET Image Restage (PS) Whole Body  Result Date: 12/18/2020 CLINICAL DATA:  Subsequent treatment strategy for basal cell carcinoma along the right leg. EXAM: NUCLEAR MEDICINE PET WHOLE BODY TECHNIQUE: 6.3 mCi F-18 FDG was injected intravenously. Full-ring PET imaging was performed from the head to foot after the radiotracer. CT data was obtained and used for attenuation correction and anatomic localization. Fasting blood glucose: 123 mg/dl COMPARISON:  11/06/2019 and chest CT of 11/06/2020 FINDINGS: Mediastinal blood pool activity: SUV max 2.1 HEAD/NECK: No significant abnormal hypermetabolic activity in this region. Incidental CT findings: Bilateral common carotid atherosclerotic calcification. CHEST: Neither of the 2 small left upper lobe pulmonary nodules demonstrates significant hypermetabolic activity. No significant abnormal hypermetabolic activity in this region. Incidental CT findings: Coronary, aortic arch, and branch vessel atherosclerotic vascular disease. Centrilobular emphysema. ABDOMEN/PELVIS: No significant abnormal hypermetabolic activity in this region. Incidental CT findings: Aortoiliac atherosclerotic vascular disease. Chronic calcific pancreatitis. Postoperative findings in the distal colon. SKELETON: No significant abnormal hypermetabolic activity in this region. Incidental CT findings: none EXTREMITIES: Abnormal accentuated activity along the region of cutaneous thinning and subcutaneous density likely corresponding to the operative site along the anteromedial left thigh region. This region was not included on the prior PET-CT. There is a 5.1 by 1.4 by 5.9 cm band of density in this region on image 333 of  series 3 with maximum SUV 10.5. Although a local granulomatous response could cause a similar appearance, the appearance does raise some concern for the possibility of active malignancy. Incidental CT findings: none IMPRESSION: 1. In the postoperative region there is a 5.1 by 1.4 by 5.9 cm hypermetabolic bandlike region in the subcutaneous tissues, maximum SUV 87.5, which could certainly represent recurrent malignancy. Active care annual [place] thus response could cause a false-positive appearance. Please note this region was not included on the prior PET-CT of 11/06/2020. 2. No findings of distant active malignancy. 3. Other imaging findings of potential clinical significance: Aortic Atherosclerosis (ICD10-I70.0) and Emphysema (ICD10-J43.9). Coronary  atherosclerosis. Chronic calcific pancreatitis. Stable left upper lobe pulmonary nodules are not hypermetabolic. Electronically Signed   By: Van Clines M.D.   On: 12/18/2020 15:44    ASSESSMENT: Iron deficiency anemia, locally advanced basal cell carcinoma of the right leg.  PLAN:    1. Iron deficiency anemia: Resolved.  Patient last received Feraheme on Jan 28, 2017.     2. Basal cell carcinoma of the right leg: Resolved without evidence of recurrence.  Patient stated this lesion grew for 9 years and she did not seek medical attention. Diagnosis confirmed by biopsy from surgery.  Patient underwent embolization as well as to neoadjuvant treatment with vismodegib (Erivedge)150 mg daily, a hedgehog inhibitor. Patient underwent complete resection of residual malignancy on March 29, 2017.  Previously, adjuvant chemotherapy and XRT was discussed, but given widely clear margins on surgical resections this was determined not to be necessary.  Given new onset symptoms in the area of her malignancy, PET scan was ordered.  Results from December 18, 2020 reviewed independently and report as above with hypermetabolic bandlike region with SUV of 10.5.  Unclear if this is  inflammation or recurrent malignancy.  Plan to discuss case with her primary surgeon Dr. Windell Moment at [facility] to determine whether biopsy is necessary or continue simple observation.  Follow-up will be based on that conversation.   3. Hypertension: Chronic and unchanged.  Continue monitoring and treatment per primary care. 4.  Pulmonary nodules: Likely benign.  CT scan results from November 06, 2020 reviewed independently and report as above with stable nodules.  These are not hypermetabolic based on her most recent PET scan as above.  No further imaging is necessary.  I provided 20 minutes of face-to-face video visit time during this encounter which included chart review, counseling, and coordination of care as documented above.  Patient expressed understanding and was in agreement with this plan. She also understands that She can call clinic at any time with any questions, concerns, or complaints.   Cancer Staging Basal cell carcinoma, leg, right Staging form: Melanoma of the Skin, AJCC 7th Edition - Clinical: No stage assigned - Unsigned   Lloyd Huger, MD   12/23/2020 11:36 AM

## 2020-12-22 ENCOUNTER — Other Ambulatory Visit: Payer: Self-pay

## 2020-12-22 ENCOUNTER — Inpatient Hospital Stay: Payer: Medicare HMO | Attending: Oncology | Admitting: Oncology

## 2020-12-22 DIAGNOSIS — C44712 Basal cell carcinoma of skin of right lower limb, including hip: Secondary | ICD-10-CM

## 2020-12-23 ENCOUNTER — Telehealth: Payer: Self-pay

## 2020-12-23 NOTE — Telephone Encounter (Signed)
  Chronic Care Management   Outreach Note  12/23/2020 Name: Patricia Galloway MRN: [id number] DOB: 02-Jul-1951   Primary Care Provider: [name], MD Reason for referral : Chronic Care Management   An unsuccessful telephone outreach was attempted today. [name] is currently enrolled in the Chronic Care Management program.     Follow Up Plan:  A HIPAA compliant voice message was left today requesting a return call.    C[facility] Care Management [facility] [phone]

## 2020-12-31 ENCOUNTER — Other Ambulatory Visit: Payer: Self-pay | Admitting: *Deleted

## 2020-12-31 DIAGNOSIS — C44712 Basal cell carcinoma of skin of right lower limb, including hip: Secondary | ICD-10-CM

## 2021-01-04 ENCOUNTER — Telehealth: Payer: Self-pay | Admitting: *Deleted

## 2021-01-04 NOTE — Telephone Encounter (Signed)
Patient scheduled for CT guided biopsy on 5/24 at 9:30. Patient to arrive at 8:30 for procedure. Patient advised that she will need a driver for this procedure. Patient had requested biopsy be moved out to later in May upon discussion last week. Patient verbalized understanding of appointment details.

## 2021-01-11 ENCOUNTER — Telehealth: Payer: Self-pay

## 2021-01-11 NOTE — Progress Notes (Signed)
Chronic Care Management Pharmacy Assistant   Name: Patricia Galloway  MRN: [id number] DOB: 05-21-1951  Reason for [place] Call.   Recent office visits:  No recent Office Visit  Recent consult visits:  12/22/2020 Dr Grayland Ormond MD (Oncology)   Hospital visits:  None in previous 6 months  Medications: Outpatient Encounter Medications as of 01/11/2021  Medication Sig Note  . albuterol (VENTOLIN HFA) 108 (90 Base) MCG/ACT inhaler Inhale 2 puffs into the lungs every 6 (six) hours as needed for wheezing or shortness of breath. Patient requests generic Ventolin   . amLODipine (NORVASC) 5 MG tablet Take 1 tablet (5 mg total) by mouth every evening.   . benzonatate (TESSALON) 100 MG capsule Take 1-2 capsules (100-200 mg total) by mouth 2 (two) times daily as needed. 12/22/2020: Pt is taking PRN   . BREO ELLIPTA 200-25 MCG/INH AEPB INHALE 1 PUFF BY MOUTH EVERY DAY   . Cholecalciferol (VITAMIN D3) 250 MCG (10000 UT) TABS Take 1 tablet by mouth 2 (two) times a week.   . diazepam (VALIUM) 5 MG tablet Take 1 tablet (5 mg total) by mouth daily as needed for anxiety. 30 minutes before doctor's visit 12/22/2020: Pt is taking PRN   . montelukast (SINGULAIR) 10 MG tablet Take 1 tablet (10 mg total) by mouth at bedtime.   . Multiple Vitamin (MULTIVITAMIN) capsule Take 2 capsules by mouth daily. 12/22/2020: Pt is taking and just does not have any on hand right now   . OVER THE COUNTER MEDICATION Take 1 tablet by mouth 2 (two) times daily. Nutri-Calm 09/11/2018: Only with radiation  . OVER THE COUNTER MEDICATION Life extension bone strength / life extension skin restore ceramide / life extension super bio curcumin   . valsartan (DIOVAN) 160 MG tablet Take 1 tablet (160 mg total) by mouth daily. (Patient not taking: Reported on 11/10/2020)    No facility-administered encounter medications on file as of 01/11/2021.   Star Rating Drugs: Valsartan 160 mg last filled on 11/10/2020 for 90 day  supply at [facility].  Reviewed chart prior to disease state call. Spoke with patient regarding BP  Recent Office Vitals: BP Readings from Last 3 Encounters:  10/06/20 (!) 160/98  05/19/20 (!) 210/120  02/13/20 (!) 220/110   Pulse Readings from Last 3 Encounters:  10/06/20 (!) 108  05/19/20 97  02/13/20 95    Wt Readings from Last 3 Encounters:  10/06/20 118 lb 14.4 oz (53.9 kg)  08/18/20 121 lb 9.6 oz (55.2 kg)  05/19/20 116 lb 9.6 oz (52.9 kg)     Kidney Function Lab Results  Component Value Date/Time   CREATININE 0.60 11/06/2020 11:29 AM   CREATININE 0.76 02/13/2020 11:45 AM   CREATININE 0.80 06/17/2019 11:00 AM   CREATININE 0.75 02/12/2019 10:08 AM   GFRNONAA 81 02/13/2020 11:45 AM   GFRAA 93 02/13/2020 11:45 AM    BMP Latest Ref Rng & Units 11/06/2020 02/13/2020 06/17/2019  Glucose 65 - 99 mg/dL - 94 -  BUN 7 - 25 mg/dL - 14 -  Creatinine 0.44 - 1.00 mg/dL 0.60 0.76 0.80  BUN/Creat Ratio 6 - 22 (calc) - NOT APPLICABLE -  Sodium 825 - 146 mmol/L - 137 -  Potassium 3.5 - 5.3 mmol/L - 4.1 -  Chloride 98 - 110 mmol/L - 99 -  CO2 20 - 32 mmol/L - 29 -  Calcium 8.6 - 10.4 mg/dL - 9.9 -    . Current antihypertensive regimen:   Amlodipine 5 mg  daily   Valsartan 160 mg daily   Patient denies headaches,dizzines or lightheadedness. Patient states she just started taking Valsartan 160 mg about a week ago. Patient states she takes her Valsartan in the morning and Amlodipine at night.Patient states she use to take them together as a combine medication  and started having symptoms of dizziness.Patient reported her symptoms to her PCP ,and her PCP change Amlodpine-Valsartan to two separate tablets instead of being combine.Patient states her PCP ask her to take one in the morning and the other at night.Patient states her symptom has improved since this change.Patient reports she has a follow up with the nurse on 02/04/2021 for a blood pressure check.  . How often are you  checking your Blood Pressure? o  Patient states she has not check her blood pressure at home. . Current home BP readings: n/a . What recent interventions/DTPs have been made by any provider to improve Blood Pressure control since last CPP Visit: None ID . Any recent hospitalizations or ED visits since last visit with CPP? No . What diet changes have been made to improve Blood Pressure Control?  o No changes indicated  . What exercise is being done to improve your Blood Pressure Control?  o Patient reports she has not been exercise because she is having issues with her leg.Patient states she has a biopsy on 01/26/2021.  Adherence Review: Is the patient currently on ACE/ARB medication? Yes Does the patient have >5 day gap between last estimated fill dates? No  Anderson [place] Clinical Production designer, theatre/television/film [phone]

## 2021-01-22 NOTE — Progress Notes (Signed)
Patient schedule for Thigh biopsy time change on 01/26/2021, called and spoke with patient on phone and is in agreeance to come at 1230, NPO after 0630, and driver post procedure. Stated understanding.

## 2021-01-23 ENCOUNTER — Encounter: Payer: Self-pay | Admitting: Family Medicine

## 2021-01-25 ENCOUNTER — Other Ambulatory Visit: Payer: Self-pay

## 2021-01-25 ENCOUNTER — Other Ambulatory Visit: Payer: Self-pay | Admitting: Radiology

## 2021-01-25 DIAGNOSIS — J449 Chronic obstructive pulmonary disease, unspecified: Secondary | ICD-10-CM

## 2021-01-25 DIAGNOSIS — J432 Centrilobular emphysema: Secondary | ICD-10-CM

## 2021-01-25 MED ORDER — ALBUTEROL SULFATE HFA 108 (90 BASE) MCG/ACT IN AERS: 2.0000 | INHALATION_SPRAY | Freq: Four times a day (QID) | RESPIRATORY_TRACT | 0 refills | Status: DC | PRN

## 2021-01-26 ENCOUNTER — Other Ambulatory Visit: Payer: Self-pay | Admitting: Oncology

## 2021-01-26 ENCOUNTER — Ambulatory Visit: Admission: RE | Admit: 2021-01-26 | Payer: Medicare HMO | Source: Ambulatory Visit

## 2021-01-26 ENCOUNTER — Ambulatory Visit
Admission: RE | Admit: 2021-01-26 | Discharge: 2021-01-26 | Disposition: A | Discharge status: Home / Self Care | Payer: Medicare HMO | Source: Ambulatory Visit | Attending: Oncology | Admitting: Oncology

## 2021-01-26 ENCOUNTER — Other Ambulatory Visit: Payer: Self-pay

## 2021-01-26 ENCOUNTER — Ambulatory Visit
Admission: RE | Admit: 2021-01-26 | Discharge: 2021-01-26 | Disposition: A | Discharge status: Home / Self Care | Payer: Medicare HMO | Source: Ambulatory Visit | Attending: Interventional Radiology | Admitting: Interventional Radiology

## 2021-01-26 DIAGNOSIS — C44712 Basal cell carcinoma of skin of right lower limb, including hip: Secondary | ICD-10-CM

## 2021-01-26 DIAGNOSIS — Z7689 Persons encountering health services in other specified circumstances: Secondary | ICD-10-CM | POA: Diagnosis not present

## 2021-01-26 DIAGNOSIS — C44702 Unspecified malignant neoplasm of skin of right lower limb, including hip: Secondary | ICD-10-CM | POA: Insufficient documentation

## 2021-01-26 DIAGNOSIS — Z87891 Personal history of nicotine dependence: Secondary | ICD-10-CM | POA: Insufficient documentation

## 2021-01-26 DIAGNOSIS — M7989 Other specified soft tissue disorders: Secondary | ICD-10-CM | POA: Diagnosis not present

## 2021-01-26 DIAGNOSIS — Z79899 Other long term (current) drug therapy: Secondary | ICD-10-CM | POA: Insufficient documentation

## 2021-01-26 DIAGNOSIS — Z85828 Personal history of other malignant neoplasm of skin: Secondary | ICD-10-CM | POA: Insufficient documentation

## 2021-01-26 DIAGNOSIS — R2241 Localized swelling, mass and lump, right lower limb: Secondary | ICD-10-CM | POA: Diagnosis not present

## 2021-01-26 MED ORDER — SODIUM CHLORIDE 0.9 % IV SOLN: INTRAVENOUS | Status: DC

## 2021-01-26 MED ORDER — MIDAZOLAM HCL 2 MG/2ML IJ SOLN
INTRAMUSCULAR | Status: AC | PRN
  Administered 2021-01-26 (×2): 1 mg via INTRAVENOUS

## 2021-01-26 MED ORDER — FENTANYL CITRATE (PF) 100 MCG/2ML IJ SOLN
INTRAMUSCULAR | Status: AC
  Filled 2021-01-26: qty 2

## 2021-01-26 MED ORDER — MIDAZOLAM HCL 2 MG/2ML IJ SOLN
INTRAMUSCULAR | Status: AC
  Filled 2021-01-26: qty 2

## 2021-01-26 MED ORDER — FENTANYL CITRATE (PF) 100 MCG/2ML IJ SOLN
INTRAMUSCULAR | Status: AC | PRN
  Administered 2021-01-26 (×2): 50 ug via INTRAVENOUS

## 2021-01-26 NOTE — Procedures (Signed)
Pre Procedure Dx: Right thight soft tissue mass Post Procedural Dx: Same  Technically successful US guided biopsy of hypermetabolic right anterior thigh soft tissue mass.   EBL: None  No immediate complications.   Ronny Bacon, MD Pager #: [phone]

## 2021-01-26 NOTE — H&P (Signed)
Chief Complaint: Hypermetabolic right thigh lesion. Request is for right anterior thigh biopsy  Referring Physician(s): Finnegan,Timothy J  Supervising Physician: Sandi Mariscal  Patient Status: [facility] - Out-pt  History of Present Illness: Patricia Galloway is a 70 y.o. female History of asthma, aortic aneurysm, anemia, basal cell carcinoma of the right thigh s/p excision on 7.25.18 and I & D on 7.26.18. PET from 4.14.22 obtained for staging purposes reads In the postoperative region there is a 5.1 by 1.4 by 5.9 cm hypermetabolic bandlike region in the subcutaneous tissues, maximum SUV 33.4, which could certainly represent recurrent malignancy. Active care annual Oma thus response could cause a false-positive appearance. Please note this region was not included on the prior PET-CT of 11/06/2020. Team is requesting a biopsy of the hypermetabolic right anterior thigh lesion.   Patient alert and laying in bed, calm and comfortable. Endorses right thigh pain. Denies any fevers, headache, chest pain, SOB, cough, abdominal pain, nausea, vomiting or bleeding. Return precautions and treatment recommendations and follow-up discussed with the patient who is agreeable with the plan.   Past Medical History:  Diagnosis Date  . Anemia    has had iron infusion  . Anxiety    complicated grief  . Aortic aneurysm ([place])    found on CT in December, 2017  . Asthma    as child , but no episodes in over 30 years  . Basal cell carcinoma   . Cancer ([place]) 08/2016   basal cell carcinoma - right leg  . History of transfusion of packed red blood cells   . Personal history of chemotherapy 2018   basal cell     Past Surgical History:  Procedure Laterality Date  . BASAL CELL CARCINOMA EXCISION Right 03/29/2017   Procedure: EXCISION OF RIGHT LEG BASEL CELL CARCINOMA;  Surgeon: [name], DO;  Location: [place];  Service: Plastics;  Laterality: Right;  . COLON SURGERY  2002   colostomy for 10 months  after a perfurated sigmoid   . COLOSTOMY REVERSAL  2003  . EMBOLIZATION Right 10/12/2016   Procedure: Embolization;  Surgeon: [name], MD;  Location: [place] [facility];  Service: Cardiovascular;  Laterality: Right;  . etopic  3568,6168  . INCISION AND DRAINAGE OF WOUND Right 03/30/2017   Procedure: IRRIGATION AND DEBRIDEMENT WOUND; VAC PLACEMENT;  Surgeon: [name], DO;  Location: [facility];  Service: Plastics;  Laterality: Right;  . TONSILLECTOMY      Allergies: Penicillins, Codeine, Latex, Sulfa antibiotics, Tape, and Xopenex [levalbuterol hcl]  Medications: Prior to Admission medications   Medication Sig Start Date End Date Taking? Authorizing Provider  albuterol (VENTOLIN HFA) 108 (90 Base) MCG/ACT inhaler Inhale 2 puffs into the lungs every 6 (six) hours as needed for wheezing or shortness of breath. Patient requests generic Ventolin 01/25/21   [name], MD  amLODipine (NORVASC) 5 MG tablet Take 1 tablet (5 mg total) by mouth every evening. 10/06/20   [name], MD  benzonatate (TESSALON) 100 MG capsule Take 1-2 capsules (100-200 mg total) by mouth 2 (two) times daily as needed. 10/06/20   [name], MD  BREO ELLIPTA 200-25 MCG/INH AEPB INHALE 1 PUFF BY MOUTH EVERY DAY 12/03/20   [name], MD  Cholecalciferol (VITAMIN D3) 250 MCG (10000 UT) TABS Take 1 tablet by mouth 2 (two) times a week.    [provider]  diazepam (VALIUM) 5 MG tablet Take 1 tablet (5 mg total) by mouth daily as needed for anxiety.  30 minutes before doctor's visit 02/13/20   [name], MD  montelukast (SINGULAIR) 10 MG tablet Take 1 tablet (10 mg total) by mouth at bedtime. 08/18/20   [name], MD  Multiple Vitamin (MULTIVITAMIN) capsule Take 2 capsules by mouth daily.    [provider]  OVER THE COUNTER MEDICATION Take 1 tablet by mouth 2 (two) times daily. Nutri-Calm    [provider]  OVER THE COUNTER MEDICATION Life extension bone  strength / life extension skin restore ceramide / life extension super bio curcumin    [provider]  valsartan (DIOVAN) 160 MG tablet Take 1 tablet (160 mg total) by mouth daily. Patient not taking: Reported on 11/10/2020 10/06/20   [name], MD     Family History  Problem Relation Age of Onset  . Dementia Mother   . Aortic stenosis Mother   . Osteoporosis Mother   . Dementia Father   . Diabetes Father   . Hyperlipidemia Father   . Hypertension Father   . Aneurysm Sister   . Hyperlipidemia Sister   . Hypertension Sister   . Diabetes Sister   . Breast cancer Neg Hx     Social History   Socioeconomic History  . Marital status: Married    Spouse name: [name]  . Number of children: 0  . Years of education: Not on file  . Highest education level: Bachelor's degree (e.g., BA, AB, BS)  Occupational History  . Occupation: Retired  Tobacco Use  . Smoking status: Former Smoker    Packs/day: 0.50    Years: 25.00    Pack years: 12.50    Types: Cigarettes    Start date: 09/05/1972    Quit date: 08/03/1998    Years since quitting: 22.4  . Smokeless tobacco: Never Used  . Tobacco comment: smoking cessation materials not required  Vaping Use  . Vaping Use: Never used  Substance and Sexual Activity  . Alcohol use: No    Alcohol/week: 0.0 standard drinks  . Drug use: No  . Sexual activity: Not Currently    Birth control/protection: Post-menopausal  Other Topics Concern  . Not on file  Social History Narrative   Pt is primary caregiver for her husband who has stage 4 renal cell carcinoma    Social Determinants of Health   Financial Resource Strain: Low Risk   . Difficulty of Paying Living Expenses: Not hard at all  Food Insecurity: No Food Insecurity  . Worried About Charity fundraiser in the Last Year: Never true  . Ran Out of Food in the Last Year: Never true  Transportation Needs: No Transportation Needs  . Lack of Transportation (Medical): No  . Lack of  Transportation (Non-Medical): No  Physical Activity: Inactive  . Days of Exercise per Week: 0 days  . Minutes of Exercise per Session: 0 min  Stress: No Stress Concern Present  . Feeling of Stress : Only a little  Social Connections: Unknown  . Frequency of Communication with Friends and Family: Not on file  . Frequency of Social Gatherings with Friends and Family: Not on file  . Attends Religious Services: Not on file  . Active Member of Clubs or Organizations: Not on file  . Attends Archivist Meetings: Not on file  . Marital Status: Married     Review of Systems: A 12 point ROS discussed and pertinent positives are indicated in the HPI above.  All other systems are negative.  Review of Systems  Constitutional: Negative for fatigue and fever.  HENT: Negative for congestion.   Respiratory: Negative for cough and shortness of breath.   Gastrointestinal: Negative for abdominal pain, diarrhea, nausea and vomiting.  Musculoskeletal: Positive for myalgias ( right thigh pain.).    Vital Signs: There were no vitals taken for this visit.  Physical Exam Vitals and nursing note reviewed.  Constitutional:      Appearance: She is well-developed.  HENT:     Head: Normocephalic and atraumatic.  Eyes:     Conjunctiva/sclera: Conjunctivae normal.  Cardiovascular:     Rate and Rhythm: Normal rate and regular rhythm.     Heart sounds: Normal heart sounds.  Pulmonary:     Effort: Pulmonary effort is normal.     Breath sounds: Normal breath sounds.  Musculoskeletal:     Cervical back: Normal range of motion.     Comments: Post surgical medial right thigh.   Neurological:     Mental Status: She is alert and oriented to person, place, and time.     Imaging: No results found.  Labs:  CBC: Recent Labs    02/13/20 1145  WBC 8.2  HGB 14.8  HCT 44.1  PLT 268    COAGS: No results for input(s): INR, APTT in the last 8760 hours.  BMP: Recent Labs    02/13/20 1145  11/06/20 1129  NA 137  --   K 4.1  --   CL 99  --   CO2 29  --   GLUCOSE 94  --   BUN 14  --   CALCIUM 9.9  --   CREATININE 0.76 0.60  GFRNONAA 81  --   GFRAA 93  --     LIVER FUNCTION TESTS: Recent Labs    02/13/20 1145  BILITOT 0.7  AST 19  ALT 14  PROT 7.2     Assessment and Plan:  70 y.o. female outpatient. History of asthma, aortic aneurysm, anemia, basal cell carcinoma of the right thigh s/p excision on 7.25.18 and I & D on 7.26.18. PET from 4.14.22 obtained for staging purposes reads In the postoperative region there is a 5.1 by 1.4 by 5.9 cm hypermetabolic bandlike region in the subcutaneous tissues, maximum SUV 78.2, which could certainly represent recurrent malignancy. Active care annual Oma thus response could cause a false-positive appearance. Please note this region was not included on the prior PET-CT of 11/06/2020. Team is requesting a biopsy of the hypermetabolic right anterior thigh lesion.   All labs and medications are within acceptable parameters. Allergies include PCN, latex, Sulfa, tape and codeine. Patient has been NPO since midnight  Risks and benefits of right thigh biopsy was discussed with the patient and/or patient's family including, but not limited to bleeding, infection, damage to adjacent structures or low yield requiring additional tests.  All of the questions were answered and there is agreement to proceed.  Consent signed and in chart.   Thank you for this interesting consult.  I greatly enjoyed meeting Patricia Galloway and look forward to participating in their care.  A copy of this report was sent to the requesting provider on this date.  Electronically Signed: Jacqualine Mau, NP 01/26/2021, 12:41 PM   I spent a total of  30 Minutes   in face to face in clinical consultation, greater than 50% of which was counseling/coordinating care for right thigh biopsy

## 2021-01-26 NOTE — Progress Notes (Signed)
Patient clinically stable post Right thigh soft tissue biopsy per Dr Pascal Lux, tolerated well. Denies complaints at this time. Received Versed 2 mg along with Fentanyl 100 mcg IV for procedure. Awake/alert and oriented post procedure. Report given to Fransico Michael Rn post procedure in specials.

## 2021-01-27 LAB — SURGICAL PATHOLOGY

## 2021-01-28 NOTE — Progress Notes (Signed)
[facility]  [phone]) [phone] [phone]  ID: Patricia Galloway OB: November 16, 1950  MR#: [id number]  IHW#:[id number]  Patient Care Team: [name], MD as PCP - General (Family Medicine) Lloyd Huger, MD as Consulting Physician (Oncology) Dillingham, Loel Lofty, DO as Attending Physician (Plastic Surgery) Neldon Labella, RN as Case Manager Germaine Pomfret, [facility] as Pharmacist (Pharmacist)  CHIEF COMPLAINT: Recurrent basal cell carcinoma of the right leg.  INTERVAL HISTORY: Patient returns to clinic today for further evaluation and discussion of her biopsy results.  She continues to have sensation and tenderness in her right leg at the site of her previous malignancy, but otherwise feels well.  She has no neurologic complaints. She denies any recent fevers or illnesses.  She has a good appetite and denies weight loss.  She denies any chest pain, shortness of breath, cough, or hemoptysis.  She denies any nausea, vomiting, constipation, or diarrhea.  She has no melena or hematochezia.  She has no urinary complaints.  Patient offers no further specific complaints today.  REVIEW OF SYSTEMS:   Review of Systems  Constitutional: Negative for fever, malaise/fatigue and weight loss.  Respiratory: Negative.  Negative for cough and shortness of breath.   Cardiovascular: Negative.  Negative for chest pain and leg swelling.  Gastrointestinal: Negative.  Negative for abdominal pain, nausea and vomiting.  Genitourinary: Negative.  Negative for dysuria.  Musculoskeletal: Negative.  Negative for myalgias.  Skin: Negative.  Negative for rash.  Neurological: Negative.  Negative for sensory change, focal weakness and weakness.  Psychiatric/Behavioral: Negative.  The patient is not nervous/anxious.     As per HPI. Otherwise, a complete review of systems is negative.  PAST MEDICAL HISTORY: Past Medical History:  Diagnosis Date  . Anemia    has had iron infusion   . Anxiety    complicated grief  . Aortic aneurysm ([place])    found on CT in December, 2017  . Asthma    as child , but no episodes in over 30 years  . Basal cell carcinoma   . Cancer ([place]) 08/2016   basal cell carcinoma - right leg  . History of transfusion of packed red blood cells   . Personal history of chemotherapy 2018   basal cell     PAST SURGICAL HISTORY: Past Surgical History:  Procedure Laterality Date  . BASAL CELL CARCINOMA EXCISION Right 03/29/2017   Procedure: EXCISION OF RIGHT LEG BASEL CELL CARCINOMA;  Surgeon: [name], DO;  Location: [place];  Service: Plastics;  Laterality: Right;  . COLON SURGERY  2002   colostomy for 10 months after a perfurated sigmoid   . COLOSTOMY REVERSAL  2003  . EMBOLIZATION Right 10/12/2016   Procedure: Embolization;  Surgeon: [name], MD;  Location: [place] [facility];  Service: Cardiovascular;  Laterality: Right;  . etopic  4917,9150  . INCISION AND DRAINAGE OF WOUND Right 03/30/2017   Procedure: IRRIGATION AND DEBRIDEMENT WOUND; VAC PLACEMENT;  Surgeon: [name], DO;  [facility]: [facility];  Service: Plastics;  Laterality: Right;  . TONSILLECTOMY      FAMILY HISTORY: Family History  Problem Relation Age of Onset  . Dementia Mother   . Aortic stenosis Mother   . Osteoporosis Mother   . Dementia Father   . Diabetes Father   . Hyperlipidemia Father   . Hypertension Father   . Aneurysm Sister   . Hyperlipidemia Sister   . Hypertension Sister   . Diabetes Sister   .  Breast cancer Neg Hx     ADVANCED DIRECTIVES (Y/N):  N  HEALTH MAINTENANCE: Social History   Tobacco Use  . Smoking status: Former Smoker    Packs/day: 0.50    Years: 25.00    Pack years: 12.50    Types: Cigarettes    Start date: 09/05/1972    Quit date: 08/03/1998    Years since quitting: 22.5  . Smokeless tobacco: Never Used  . Tobacco comment: smoking cessation materials not required  Vaping Use  . Vaping Use: Never used   Substance Use Topics  . Alcohol use: No    Alcohol/week: 0.0 standard drinks  . Drug use: No     Colonoscopy:  PAP:  Bone density:  Lipid panel:  Allergies  Allergen Reactions  . Penicillins Anaphylaxis    Has patient had a PCN reaction causing immediate rash, facial/tongue/throat swelling, SOB or lightheadedness with hypotension: Yes Has patient had a PCN reaction causing severe rash involving mucus membranes or skin necrosis: No Has patient had a PCN reaction that required hospitalization: No Has patient had a PCN reaction occurring within the last 10 years: No If all of the above answers are "NO", then may proceed with Cephalosporin use.   . Codeine Nausea And Vomiting  . Latex Itching  . Sulfa Antibiotics Other (See Comments)    TOPICAL-SULFA CAUSED BLISTERS  . Tape Other (See Comments)    Reaction:  Blisters and burning  Pt states that paper tape is okay.    [address] Hcl] Anxiety    Current Outpatient Medications  Medication Sig Dispense Refill  . albuterol (VENTOLIN HFA) 108 (90 Base) MCG/ACT inhaler Inhale 2 puffs into the lungs every 6 (six) hours as needed for wheezing or shortness of breath. Patient requests generic Ventolin 18 g 0  . amLODipine (NORVASC) 5 MG tablet Take 1 tablet (5 mg total) by mouth every evening. 90 tablet 1  . benzonatate (TESSALON) 100 MG capsule Take 1-2 capsules (100-200 mg total) by mouth 2 (two) times daily as needed. 40 capsule 0  . BREO ELLIPTA 200-25 MCG/INH AEPB INHALE 1 PUFF BY MOUTH EVERY DAY 60 each 2  . Cholecalciferol (VITAMIN D3) 250 MCG (10000 UT) TABS Take 1 tablet by mouth 2 (two) times a week.    . diazepam (VALIUM) 5 MG tablet Take 1 tablet (5 mg total) by mouth daily as needed for anxiety. 30 minutes before doctor's visit 30 tablet 0  . montelukast (SINGULAIR) 10 MG tablet Take 1 tablet (10 mg total) by mouth at bedtime. 90 tablet 1  . Multiple Vitamin (MULTIVITAMIN) capsule Take 2 capsules by mouth daily.     Marland Kitchen OVER THE COUNTER MEDICATION Take 1 tablet by mouth 2 (two) times daily. Nutri-Calm    . OVER THE COUNTER MEDICATION Life extension bone strength / life extension skin restore ceramide / life extension super bio curcumin    . valsartan (DIOVAN) 160 MG tablet Take 1 tablet (160 mg total) by mouth daily. 90 tablet 1   No current facility-administered medications for this visit.    OBJECTIVE: Vitals:   02/03/21 1023  BP: (!) 198/107  Pulse: (!) 118  Resp: 17  Temp: 98.8 F (37.1 C)  SpO2: 98%     Body mass index is 20.27 kg/m.    ECOG FS:0 - Asymptomatic   General: Well-developed, well-nourished, no acute distress. Eyes: Pink conjunctiva, anicteric sclera. HEENT: Normocephalic, moist mucous membranes. Lungs: No audible wheezing or coughing. Heart: Regular rate and rhythm.  Abdomen: Soft, nontender, no obvious distention. Musculoskeletal: Right leg with well-healed large surgical wound, new nodules on the superior side of scar. Neuro: Alert, answering all questions appropriately. Cranial nerves grossly intact. Skin: No rashes or petechiae noted. Psych: Normal affect.    LAB RESULTS:  Lab Results  Component Value Date   NA 137 02/13/2020   K 4.1 02/13/2020   CL 99 02/13/2020   CO2 29 02/13/2020   GLUCOSE 94 02/13/2020   BUN 14 02/13/2020   CREATININE 0.60 11/06/2020   CALCIUM 9.9 02/13/2020   PROT 7.2 02/13/2020   ALBUMIN 4.4 02/11/2019   AST 19 02/13/2020   ALT 14 02/13/2020   ALKPHOS 62 02/11/2019   BILITOT 0.7 02/13/2020   GFRNONAA 81 02/13/2020   GFRAA 93 02/13/2020    Lab Results  Component Value Date   WBC 8.2 02/13/2020   NEUTROABS 4,600 02/13/2020   HGB 14.8 02/13/2020   HCT 44.1 02/13/2020   MCV 90.0 02/13/2020   PLT 268 02/13/2020   Lab Results  Component Value Date   IRON 71 01/09/2017   TIBC 225 (L) 01/09/2017   IRONPCTSAT 32 (H) 01/09/2017   Lab Results  Component Value Date   FERRITIN 58 01/09/2017    STUDIES: CT Biopsy  Result  Date: 01/26/2021 INDICATION: History of locally advanced of the cell carcinoma of the right thigh, post resection now with hypermetabolic nodular soft tissue at the resection site worrisome for locally recurrent disease. Please perform image guided biopsy for tissue diagnostic purposes. EXAM: ULTRASOUND AND CT-GUIDED BIOPSY OF HYPERMETABOLIC RIGHT ANTERIOR THIGH SUBCUTANEOUS MASS COMPARISON:  PET-CT-12/17/2020 MEDICATIONS: None. ANESTHESIA/SEDATION: Fentanyl 100 mcg IV; Versed 2 mg IV Sedation time: 10 minutes; The patient was continuously monitored during the procedure by the interventional radiology nurse under my direct supervision. CONTRAST:  None. COMPLICATIONS: None immediate. PROCEDURE: Informed consent was obtained from the patient following an explanation of the procedure, risks, benefits and alternatives. A time out was performed prior to the initiation of the procedure. The patient was positioned supine on the CT table and a limited CT was performed for procedural planning demonstrating unchanged macrolobulated mass involving the anterior aspect of the mid/distal thigh with dominant component measuring approximately 4.0 x 1.5 cm (image 13, series 2). The CT gantry table position was marked and the lesion was identified sonographically. The procedure was planned. The operative site was prepped and draped in the usual sterile fashion. Under direct ultrasound guidance , a 17 gauge coaxial needle was advanced into the peripheral aspect of the mass. Appropriate position was confirmed with CT imaging. Next, under direct ultrasound guidance, 6 core needle biopsies were obtained with an 18 gauge core needle biopsy device. The co-axial needle was removed and hemostasis was achieved with manual compression. A dressing was applied. The patient tolerated the procedure well without immediate postprocedural complication. IMPRESSION: Technically successful ultrasound and CT guided core needle biopsy of hypermetabolic  subcutaneous mass within the anterior mid/distal aspect of the right thigh. Electronically Signed   By: Sandi Mariscal M.D.   On: 01/26/2021 14:46   [place] CORE BIOPSY (SOFT TISSUE)  Result Date: 01/26/2021 INDICATION: History of locally advanced of the cell carcinoma of the right thigh, post resection now with hypermetabolic nodular soft tissue at the resection site worrisome for locally recurrent disease. Please perform image guided biopsy for tissue diagnostic purposes. EXAM: ULTRASOUND AND CT-GUIDED BIOPSY OF HYPERMETABOLIC RIGHT ANTERIOR THIGH SUBCUTANEOUS MASS COMPARISON:  PET-CT-12/17/2020 MEDICATIONS: None. ANESTHESIA/SEDATION: Fentanyl 100 mcg IV; Versed 2 mg IV  Sedation time: 10 minutes; The patient was continuously monitored during the procedure by the interventional radiology nurse under my direct supervision. CONTRAST:  None. COMPLICATIONS: None immediate. PROCEDURE: Informed consent was obtained from the patient following an explanation of the procedure, risks, benefits and alternatives. A time out was performed prior to the initiation of the procedure. The patient was positioned supine on the CT table and a limited CT was performed for procedural planning demonstrating unchanged macrolobulated mass involving the anterior aspect of the mid/distal thigh with dominant component measuring approximately 4.0 x 1.5 cm (image 13, series 2). The CT gantry table position was marked and the lesion was identified sonographically. The procedure was planned. The operative site was prepped and draped in the usual sterile fashion. Under direct ultrasound guidance , a 17 gauge coaxial needle was advanced into the peripheral aspect of the mass. Appropriate position was confirmed with CT imaging. Next, under direct ultrasound guidance, 6 core needle biopsies were obtained with an 18 gauge core needle biopsy device. The co-axial needle was removed and hemostasis was achieved with manual compression. A dressing was applied.  The patient tolerated the procedure well without immediate postprocedural complication. IMPRESSION: Technically successful ultrasound and CT guided core needle biopsy of hypermetabolic subcutaneous mass within the anterior mid/distal aspect of the right thigh. Electronically Signed   By: Sandi Mariscal M.D.   On: 01/26/2021 14:46   ONCOLOGY HISTORY:  Patient stated this lesion grew for 9 years and she did not seek medical attention. Diagnosis confirmed by biopsy from surgery.  Patient underwent embolization as well as to neoadjuvant treatment with vismodegib (Erivedge)150 mg daily, a hedgehog inhibitor. Patient underwent complete resection of residual malignancy on March 29, 2017.  Previously, adjuvant chemotherapy and XRT was discussed, but given widely clear margins on surgical resections this was determined not to be necessary.   ASSESSMENT: Recurrent basal cell carcinoma of the right leg.  PLAN:    1. Recurrent basal cell carcinoma of the right leg: PET scan results from December 18, 2020 reviewed independently with hypermetabolic bandlike region with SUV of 10.5.  Biopsy confirmed recurrent malignancy.  Case discussed with patient's primary surgeon, Dr. Audelia Hives at [facility] and was determined that patient should be evaluated by orthopedic oncologist, Dr. Junie Spencer.  Patient has appointment with him on March 03, 2021.  She delayed this appointment since she is the primary caretaker of her husband who has stage IV renal cell carcinoma.  Patient will have video assisted telemedicine visit after March 08, 2021 for further evaluation and discussion of her treatment plan.   2. Iron deficiency anemia: Resolved.  Patient last received Feraheme on Jan 28, 2017.     3. Hypertension: Chronic and unchanged.  Patient's blood pressure is significantly elevated today. 4.  Pulmonary nodules: Likely benign.  CT scan results from November 06, 2020 reviewed independently and report as above with stable nodules.  These are  not hypermetabolic based on her most recent PET scan as above.  No further imaging is necessary.  I spent a total of 30 minutes reviewing chart data, face-to-face evaluation with the patient, counseling and coordination of care as detailed above.   Patient expressed understanding and was in agreement with this plan. She also understands that She can call clinic at any time with any questions, concerns, or complaints.   Cancer Staging Basal cell carcinoma, leg, right Staging form: Melanoma of the Skin, AJCC 7th Edition - Clinical: No stage assigned - Unsigned   Kathlene November  Grayland Ormond, MD   02/03/2021 3:54 PM

## 2021-02-03 ENCOUNTER — Inpatient Hospital Stay: Payer: Medicare HMO | Attending: Oncology | Admitting: Oncology

## 2021-02-03 ENCOUNTER — Other Ambulatory Visit: Payer: Self-pay

## 2021-02-03 ENCOUNTER — Encounter: Payer: Self-pay | Admitting: Oncology

## 2021-02-03 VITALS — BP 198/107 | HR 118 | Temp 98.8°F | Resp 17 | Ht 63.0 in | Wt 114.4 lb

## 2021-02-03 DIAGNOSIS — I10 Essential (primary) hypertension: Secondary | ICD-10-CM | POA: Insufficient documentation

## 2021-02-03 DIAGNOSIS — Z85828 Personal history of other malignant neoplasm of skin: Secondary | ICD-10-CM | POA: Diagnosis not present

## 2021-02-03 DIAGNOSIS — R918 Other nonspecific abnormal finding of lung field: Secondary | ICD-10-CM | POA: Insufficient documentation

## 2021-02-03 DIAGNOSIS — Z87891 Personal history of nicotine dependence: Secondary | ICD-10-CM | POA: Diagnosis not present

## 2021-02-03 DIAGNOSIS — C44712 Basal cell carcinoma of skin of right lower limb, including hip: Secondary | ICD-10-CM

## 2021-02-03 DIAGNOSIS — Z79899 Other long term (current) drug therapy: Secondary | ICD-10-CM | POA: Diagnosis not present

## 2021-02-03 DIAGNOSIS — R69 Illness, unspecified: Secondary | ICD-10-CM | POA: Diagnosis not present

## 2021-02-03 DIAGNOSIS — F419 Anxiety disorder, unspecified: Secondary | ICD-10-CM | POA: Insufficient documentation

## 2021-02-04 ENCOUNTER — Ambulatory Visit (INDEPENDENT_AMBULATORY_CARE_PROVIDER_SITE_OTHER): Payer: Medicare HMO | Admitting: Family Medicine

## 2021-02-04 ENCOUNTER — Other Ambulatory Visit: Payer: Self-pay

## 2021-02-04 VITALS — BP 146/82

## 2021-02-04 DIAGNOSIS — Z0130 Encounter for examination of blood pressure without abnormal findings: Secondary | ICD-10-CM

## 2021-02-04 NOTE — Progress Notes (Signed)
Bp check only. Provider notified of result.

## 2021-02-18 ENCOUNTER — Telehealth: Payer: Self-pay

## 2021-02-18 NOTE — Telephone Encounter (Signed)
  Care Management   Follow Up Note   02/18/2021 Name: Patricia Galloway MRN: [id number] DOB: 03/30/1951   Primary Care Provider: [name], MD Reason for referral : Chronic Care Management   An unsuccessful telephone outreach was attempted today. [name] is currently enrolled in the Chronic Care Management program.     Follow Up Plan:  A HIPAA compliant voice message was left today requesting a return call.   [facility] [phone]

## 2021-02-26 ENCOUNTER — Ambulatory Visit: Payer: Self-pay

## 2021-02-26 DIAGNOSIS — J4540 Moderate persistent asthma, uncomplicated: Secondary | ICD-10-CM

## 2021-02-26 DIAGNOSIS — I10 Essential (primary) hypertension: Secondary | ICD-10-CM

## 2021-02-26 NOTE — Chronic Care Management (AMB) (Signed)
  Care Management   Follow Up Note   02/26/2021 Name: Patricia Galloway MRN: [id number] DOB: 1950-12-24   Primary Care Provider: [name], MD Reason for referral : Chronic Care Management    [name] was referred to the care management team for assistance with chronic care management and care coordination. Her primary care provider will be notified of our unsuccessful attempts to maintain nursing contact. The care management team will gladly outreach at any time in the future if she is interested in receiving nursing assistance.    PLAN The care management team will gladly follow up with [name] after the primary care provider has a conversation with her regarding recommendation for care management engagement and subsequent re-referral for nurse care management services.     [facility] Care [facility] [phone]

## 2021-02-28 ENCOUNTER — Other Ambulatory Visit: Payer: Self-pay | Admitting: Family Medicine

## 2021-02-28 DIAGNOSIS — J4540 Moderate persistent asthma, uncomplicated: Secondary | ICD-10-CM

## 2021-02-28 DIAGNOSIS — J449 Chronic obstructive pulmonary disease, unspecified: Secondary | ICD-10-CM

## 2021-02-28 NOTE — Telephone Encounter (Signed)
Requested Prescriptions  Pending Prescriptions Disp Refills  . BREO ELLIPTA 200-25 MCG/INH AEPB [Pharmacy Med Name: BREO ELLIPTA 200-25 MCG INH] 60 each 2    Sig: INHALE 1 PUFF BY MOUTH EVERY DAY     Pulmonology:  [employer] Passed - 02/28/2021  8:50 AM      Passed - Valid encounter within last 12 months    Recent Outpatient Visits          3 weeks ago Blood pressure check   [facility] [name], MD   4 months ago Thoracic aortic aneurysm without rupture [facility])   [facility] [name], MD   9 months ago Thoracic aortic aneurysm without rupture [facility])   [facility] [name], MD   1 year ago Centrilobular emphysema [facility])   [facility] [name], MD   1 year ago Thoracic aortic aneurysm without rupture [facility])   [facility] [name], MD      Future Appointments            In 1 week Grayland Ormond, Kathlene November, MD [facility]   In 1 month [name], MD [facility] [place]   In 5 months  [facility], [facility]

## 2021-03-01 ENCOUNTER — Encounter: Payer: Self-pay | Admitting: Oncology

## 2021-03-03 ENCOUNTER — Other Ambulatory Visit: Payer: Self-pay | Admitting: Orthopedic Surgery

## 2021-03-03 DIAGNOSIS — C44712 Basal cell carcinoma of skin of right lower limb, including hip: Secondary | ICD-10-CM

## 2021-03-06 NOTE — Progress Notes (Deleted)
[place]  [phone]) [phone] Fax:([phone]) [phone]  ID: Patricia Galloway OB: Mar 26, 1951  MR#: [id number]  NLG#:[id number]  Patient Care Team: [name], MD as PCP - General (Family Medicine) Lloyd Huger, MD as Consulting Physician (Oncology) Dillingham, Loel Lofty, DO as Attending Physician (Plastic Surgery) Germaine Pomfret, [facility] as Pharmacist (Pharmacist)  I connected with Patricia Galloway on 03/06/21 at  2:30 PM EDT by {Blank single:19197::"video enabled telemedicine visit","telephone visit"} and verified that I am speaking with the correct person using two identifiers.   I discussed the limitations, risks, security and privacy concerns of performing an evaluation and management service by telemedicine and the availability of in-person appointments. I also discussed with the patient that there may be a patient responsible charge related to this service. The patient expressed understanding and agreed to proceed.   Other persons participating in the visit and their role in the encounter: Patient, MD.  Patient's location: Home. Provider's location: Clinic.  CHIEF COMPLAINT: Recurrent basal cell carcinoma of the right leg.  INTERVAL HISTORY: Patient returns to clinic today for further evaluation and discussion of her biopsy results.  She continues to have sensation and tenderness in her right leg at the site of her previous malignancy, but otherwise feels well.  She has no neurologic complaints. She denies any recent fevers or illnesses.  She has a good appetite and denies weight loss.  She denies any chest pain, shortness of breath, cough, or hemoptysis.  She denies any nausea, vomiting, constipation, or diarrhea.  She has no melena or hematochezia.  She has no urinary complaints.  Patient offers no further specific complaints today.  REVIEW OF SYSTEMS:   Review of Systems  Constitutional:  Negative for fever, malaise/fatigue and weight loss.   Respiratory: Negative.  Negative for cough and shortness of breath.   Cardiovascular: Negative.  Negative for chest pain and leg swelling.  Gastrointestinal: Negative.  Negative for abdominal pain, nausea and vomiting.  Genitourinary: Negative.  Negative for dysuria.  Musculoskeletal: Negative.  Negative for myalgias.  Skin: Negative.  Negative for rash.  Neurological: Negative.  Negative for sensory change, focal weakness and weakness.  Psychiatric/Behavioral: Negative.  The patient is not nervous/anxious.    As per HPI. Otherwise, a complete review of systems is negative.  PAST MEDICAL HISTORY: Past Medical History:  Diagnosis Date   Anemia    has had iron infusion   Anxiety    complicated grief   Aortic aneurysm ([place])    found on CT in December, 2017   Asthma    as child , but no episodes in over 30 years   Basal cell carcinoma    Cancer ([place]) 08/2016   basal cell carcinoma - right leg   History of transfusion of packed red blood cells    Personal history of chemotherapy 2018   basal cell     PAST SURGICAL HISTORY: Past Surgical History:  Procedure Laterality Date   BASAL CELL CARCINOMA EXCISION Right 03/29/2017   Procedure: EXCISION OF RIGHT LEG BASEL CELL CARCINOMA;  Surgeon: [name], DO;  Location: [place];  Service: Plastics;  Laterality: Right;   COLON SURGERY  2002   colostomy for 10 months after a perfurated sigmoid    COLOSTOMY REVERSAL  2003   EMBOLIZATION Right 10/12/2016   Procedure: Embolization;  Surgeon: [name], MD;  Location: [place] [facility];  Service: Cardiovascular;  Laterality: Right;   etopic  0814,4818   INCISION AND DRAINAGE OF WOUND  Right 03/30/2017   Procedure: IRRIGATION AND DEBRIDEMENT WOUND; VAC PLACEMENT;  Surgeon: [name], DO;  Location: [facility];  Service: Plastics;  Laterality: Right;   TONSILLECTOMY      FAMILY HISTORY: Family History  Problem Relation Age of Onset   Dementia Mother    Aortic  stenosis Mother    Osteoporosis Mother    Dementia Father    Diabetes Father    Hyperlipidemia Father    Hypertension Father    Aneurysm Sister    Hyperlipidemia Sister    Hypertension Sister    Diabetes Sister    Breast cancer Neg Hx     ADVANCED DIRECTIVES (Y/N):  N  HEALTH MAINTENANCE: Social History   Tobacco Use   Smoking status: Former    Packs/day: 0.50    Years: 25.00    Pack years: 12.50    Types: Cigarettes    Start date: 09/05/1972    Quit date: 08/03/1998    Years since quitting: 22.6   Smokeless tobacco: Never   Tobacco comments:    smoking cessation materials not required  Vaping Use   Vaping Use: Never used  Substance Use Topics   Alcohol use: No    Alcohol/week: 0.0 standard drinks   Drug use: No     Colonoscopy:  PAP:  Bone density:  Lipid panel:  Allergies  Allergen Reactions   Penicillins Anaphylaxis    Has patient had a PCN reaction causing immediate rash, facial/tongue/throat swelling, SOB or lightheadedness with hypotension: Yes Has patient had a PCN reaction causing severe rash involving mucus membranes or skin necrosis: No Has patient had a PCN reaction that required hospitalization: No Has patient had a PCN reaction occurring within the last 10 years: No If all of the above answers are "NO", then may proceed with Cephalosporin use.    Codeine Nausea And Vomiting   Latex Itching   Sulfa Antibiotics Other (See Comments)    TOPICAL-SULFA CAUSED BLISTERS   Tape Other (See Comments)    Reaction:  Blisters and burning  Pt states that paper tape is okay.     Xopenex [Levalbuterol Hcl] Anxiety    Current Outpatient Medications  Medication Sig Dispense Refill   albuterol (VENTOLIN HFA) 108 (90 Base) MCG/ACT inhaler Inhale 2 puffs into the lungs every 6 (six) hours as needed for wheezing or shortness of breath. Patient requests generic Ventolin 18 g 0   amLODipine (NORVASC) 5 MG tablet Take 1 tablet (5 mg total) by mouth every evening. 90  tablet 1   benzonatate (TESSALON) 100 MG capsule Take 1-2 capsules (100-200 mg total) by mouth 2 (two) times daily as needed. 40 capsule 0   BREO ELLIPTA 200-25 MCG/INH AEPB INHALE 1 PUFF BY MOUTH EVERY DAY 60 each 2   Cholecalciferol (VITAMIN D3) 250 MCG (10000 UT) TABS Take 1 tablet by mouth 2 (two) times a week.     diazepam (VALIUM) 5 MG tablet Take 1 tablet (5 mg total) by mouth daily as needed for anxiety. 30 minutes before doctor's visit 30 tablet 0   montelukast (SINGULAIR) 10 MG tablet Take 1 tablet (10 mg total) by mouth at bedtime. 90 tablet 1   Multiple Vitamin (MULTIVITAMIN) capsule Take 2 capsules by mouth daily.     OVER THE COUNTER MEDICATION Take 1 tablet by mouth 2 (two) times daily. Nutri-Calm     OVER THE COUNTER MEDICATION Life extension bone strength / life extension skin restore ceramide / life extension super bio curcumin  valsartan (DIOVAN) 160 MG tablet Take 1 tablet (160 mg total) by mouth daily. 90 tablet 1   No current facility-administered medications for this visit.    OBJECTIVE: There were no vitals filed for this visit.    There is no height or weight on file to calculate BMI.    ECOG FS:0 - Asymptomatic   General: Well-developed, well-nourished, no acute distress. Eyes: Pink conjunctiva, anicteric sclera. HEENT: Normocephalic, moist mucous membranes. Lungs: No audible wheezing or coughing. Heart: Regular rate and rhythm. Abdomen: Soft, nontender, no obvious distention. Musculoskeletal: Right leg with well-healed large surgical wound, new nodules on the superior side of scar. Neuro: Alert, answering all questions appropriately. Cranial nerves grossly intact. Skin: No rashes or petechiae noted. Psych: Normal affect.    LAB RESULTS:  Lab Results  Component Value Date   NA 137 02/13/2020   K 4.1 02/13/2020   CL 99 02/13/2020   CO2 29 02/13/2020   GLUCOSE 94 02/13/2020   BUN 14 02/13/2020   CREATININE 0.60 11/06/2020   CALCIUM 9.9 02/13/2020    PROT 7.2 02/13/2020   ALBUMIN 4.4 02/11/2019   AST 19 02/13/2020   ALT 14 02/13/2020   ALKPHOS 62 02/11/2019   BILITOT 0.7 02/13/2020   GFRNONAA 81 02/13/2020   GFRAA 93 02/13/2020    Lab Results  Component Value Date   WBC 8.2 02/13/2020   NEUTROABS 4,600 02/13/2020   HGB 14.8 02/13/2020   HCT 44.1 02/13/2020   MCV 90.0 02/13/2020   PLT 268 02/13/2020   Lab Results  Component Value Date   IRON 71 01/09/2017   TIBC 225 (L) 01/09/2017   IRONPCTSAT 32 (H) 01/09/2017   Lab Results  Component Value Date   FERRITIN 58 01/09/2017    STUDIES: No results found.  ONCOLOGY HISTORY:  Patient stated this lesion grew for 9 years and she did not seek medical attention. Diagnosis confirmed by biopsy from surgery.  Patient underwent embolization as well as to neoadjuvant treatment with vismodegib (Erivedge)150 mg daily, a hedgehog inhibitor. Patient underwent complete resection of residual malignancy on March 29, 2017.  Previously, adjuvant chemotherapy and XRT was discussed, but given widely clear margins on surgical resections this was determined not to be necessary.   ASSESSMENT: Recurrent basal cell carcinoma of the right leg.  PLAN:    1. Recurrent basal cell carcinoma of the right leg: PET scan results from December 18, 2020 reviewed independently with hypermetabolic bandlike region with SUV of 10.5.  Biopsy confirmed recurrent malignancy.  Case discussed with patient's primary surgeon, Dr. Audelia Hives at [facility] and was determined that patient should be evaluated by orthopedic oncologist, Dr. Junie Spencer.  Patient has appointment with him on March 03, 2021.  She delayed this appointment since she is the primary caretaker of her husband who has stage IV renal cell carcinoma.  Patient will have video assisted telemedicine visit after March 08, 2021 for further evaluation and discussion of her treatment plan.   2. Iron deficiency anemia: Resolved.  Patient last received Feraheme on Jan 28, 2017.     3. Hypertension: Chronic and unchanged.  Patient's blood pressure is significantly elevated today. 4.  Pulmonary nodules: Likely benign.  CT scan results from November 06, 2020 reviewed independently and report as above with stable nodules.  These are not hypermetabolic based on her most recent PET scan as above.  No further imaging is necessary.   I provided *** minutes of {Blank single:19197::"face-to-face video visit time","non face-to-face telephone visit  time"} during this encounter which included chart review, counseling, and coordination of care as documented above.   Patient expressed understanding and was in agreement with this plan. She also understands that She can call clinic at any time with any questions, concerns, or complaints.   Cancer Staging Basal cell carcinoma, leg, right Staging form: Melanoma of the Skin, AJCC 7th Edition - Clinical: No stage assigned - Unsigned   Lloyd Huger, MD   03/06/2021 7:59 AM

## 2021-03-09 ENCOUNTER — Encounter: Payer: Self-pay | Admitting: Oncology

## 2021-03-09 ENCOUNTER — Inpatient Hospital Stay: Payer: Medicare HMO | Admitting: Oncology

## 2021-03-10 ENCOUNTER — Telehealth: Payer: Self-pay

## 2021-03-10 NOTE — Chronic Care Management (AMB) (Signed)
Chronic Care Management Pharmacy Assistant   Name: Ikeisha Blumberg  MRN: [id number] DOB: Dec 26, 1950  Reason for Encounter: Osteoporosis Disease State Call   Recent office visits:  02/04/2021 [name], MD (PCP)- for Blood Pressure Check- No medication changes indicated  Recent consult visits:  02/03/2021 [name], MD (Oncology) for Basal Cell Carcinoma, leg right- No Medication changes indicated.   Hospital visits:  None in previous 6 months  Medications: Outpatient Encounter Medications as of 03/10/2021  Medication Sig Note   albuterol (VENTOLIN HFA) 108 (90 Base) MCG/ACT inhaler Inhale 2 puffs into the lungs every 6 (six) hours as needed for wheezing or shortness of breath. Patient requests generic Ventolin    amLODipine (NORVASC) 5 MG tablet Take 1 tablet (5 mg total) by mouth every evening.    benzonatate (TESSALON) 100 MG capsule Take 1-2 capsules (100-200 mg total) by mouth 2 (two) times daily as needed. 12/22/2020: Pt is taking PRN    BREO ELLIPTA 200-25 MCG/INH AEPB INHALE 1 PUFF BY MOUTH EVERY DAY    Cholecalciferol (VITAMIN D3) 250 MCG (10000 UT) TABS Take 1 tablet by mouth 2 (two) times a week.    diazepam (VALIUM) 5 MG tablet Take 1 tablet (5 mg total) by mouth daily as needed for anxiety. 30 minutes before doctor's visit 12/22/2020: Pt is taking PRN    montelukast (SINGULAIR) 10 MG tablet Take 1 tablet (10 mg total) by mouth at bedtime.    Multiple Vitamin (MULTIVITAMIN) capsule Take 2 capsules by mouth daily. 12/22/2020: Pt is taking and just does not have any on hand right now    OVER THE COUNTER MEDICATION Take 1 tablet by mouth 2 (two) times daily. Nutri-Calm 09/11/2018: Only with radiation   OVER THE COUNTER MEDICATION Life extension bone strength / life extension skin restore ceramide / life extension super bio curcumin    valsartan (DIOVAN) 160 MG tablet Take 1 tablet (160 mg total) by mouth daily.    No facility-administered encounter medications on file  as of 03/10/2021.    Care Gaps: COVID-19, Shingrix (1 or 2), Colonoscopy  Star Rating Drugs: Valsartan 160 mg last filled on 02/08/2021 for a 90-Day supply at [facility]  Osteoporosis  Has the patient had any falls recently? No falls    Do you feel unsteady when standing or walking? Patient reports that she feels steady with walking and she does have a walker, cane and wheel chair available that can assist her if she has any issues especially if she has to have surgery on her leg.   Are you worried about falling? At this time the patient denies being worried about falling.   How many servings of dietary calcium (Milk, cheese, yogurt) do they get daily? Patient reports she probably doesn't get enough dairy during the week. Some yogurt, half and half in her coffee daily, 4 oz of Almond milk nightly, and she does take magnesium nightly as well that assist her with sleeping also.   Are they taking any OTC vitamin D or calcium supplements? She is taking Bone Restore  Spoke with the patient she reports that she don't think she has any issues as far as medication's are concerned. She reports that she does have a tumor under the skin of R thigh and this is close to where her last one was. It's the basal carcinoma. She does have a MRI later this afternoon and she will have surgery done at [facility] once she knows more. She does have  concern as she is the Primary caregiver for her husband and she is worried about how he will be taken care of. She denies any falls, but stated she has some sharp pains when walking in that area where the tumor is located every once in awhile. She reports being active as much as she can as she is homebound most of the time due to her being the caregiver for her spouse.  Patient reports that she was able to get out in the yard and plant tomato plants yesterday. Right now patient reports that her stress level due to her leg is a little high because if she has to have surgery  she is not sure what she will do about her spouse. She does have some neighbors who can check in on him, but unsure of what resources are available to her especially if she isn't able to drive. I informed her that I would contact her providers office to see if they could have a SW contact her to give her some resources in her area and answer some questions that she may have.   Called the providers office and spoke with [place] and she stated that she would send a message over with the request for a referral for Social Work to contact Mrs. Glennon Hamilton. Informed [place] that per patient if she doesn't answer to please leave a detailed message so she can contact them back as she really needs to speak with someone about her options.  Next appointment with [name], CPP 05/12/2021 Last AWV 08/18/2020 Next scheduled AWV 08/24/2021   [name], CPA/CMA Clinical Pharmacist Assistant Phone: [phone]

## 2021-03-15 ENCOUNTER — Ambulatory Visit
Admission: RE | Admit: 2021-03-15 | Discharge: 2021-03-15 | Disposition: A | Discharge status: Home / Self Care | Payer: Medicare HMO | Source: Ambulatory Visit | Attending: Orthopedic Surgery | Admitting: Orthopedic Surgery

## 2021-03-15 ENCOUNTER — Other Ambulatory Visit: Payer: Self-pay

## 2021-03-15 ENCOUNTER — Telehealth: Payer: Self-pay

## 2021-03-15 DIAGNOSIS — C4491 Basal cell carcinoma of skin, unspecified: Secondary | ICD-10-CM | POA: Diagnosis not present

## 2021-03-15 DIAGNOSIS — C44712 Basal cell carcinoma of skin of right lower limb, including hip: Secondary | ICD-10-CM | POA: Diagnosis not present

## 2021-03-15 MED ORDER — GADOBUTROL 1 MMOL/ML IV SOLN
5.0000 mL | Freq: Once | INTRAVENOUS | Status: AC | PRN
  Administered 2021-03-15: 5 mL via INTRAVENOUS

## 2021-03-15 NOTE — Telephone Encounter (Signed)
Copied from [place] [id number]. Topic: General - Other >> Mar 15, 2021  1:38 PM Bayard Beaver wrote: Reason for JWJ:XBJYN clincal asst for alex called for patient to see if she can get a Education officer, museum for assistance for her, because she is going to have surgery, but she is the primary caregiver for her husband so she is looking for asistance. Please call back

## 2021-03-16 ENCOUNTER — Other Ambulatory Visit: Payer: Self-pay | Admitting: Family Medicine

## 2021-03-16 DIAGNOSIS — C4491 Basal cell carcinoma of skin, unspecified: Secondary | ICD-10-CM

## 2021-03-16 NOTE — Telephone Encounter (Signed)
Good morning,  Patient was wondering if we could put in a referral for social work for assistance with her upcoming surgery? Would you be able to help place that referral?   Thanks, Doristine Section, [place] [facility] [phone]

## 2021-03-17 ENCOUNTER — Telehealth: Payer: Self-pay | Admitting: *Deleted

## 2021-03-17 NOTE — Chronic Care Management (AMB) (Signed)
  Chronic Care Management   Note  03/17/2021 Name: Patricia Galloway MRN: [id number] DOB: 04-21-1951  Patricia Galloway is a 70 y.o. year old female who is a primary care patient of [name], MD. Patricia Galloway is currently enrolled in care management services. An additional referral for Licensed Clinical SW  was placed.   Follow up plan: Telephone appointment with care management team member scheduled for:03/22/2021  Demetrick Eichenberger  Care Guide, Embedded Care Coordination [place]  Care Management

## 2021-03-18 DIAGNOSIS — D485 Neoplasm of uncertain behavior of skin: Secondary | ICD-10-CM | POA: Diagnosis not present

## 2021-03-22 ENCOUNTER — Ambulatory Visit: Payer: Self-pay | Admitting: *Deleted

## 2021-03-22 ENCOUNTER — Ambulatory Visit (INDEPENDENT_AMBULATORY_CARE_PROVIDER_SITE_OTHER): Payer: Medicare HMO | Admitting: *Deleted

## 2021-03-22 DIAGNOSIS — C44712 Basal cell carcinoma of skin of right lower limb, including hip: Secondary | ICD-10-CM

## 2021-03-22 DIAGNOSIS — F325 Major depressive disorder, single episode, in full remission: Secondary | ICD-10-CM

## 2021-03-22 DIAGNOSIS — R69 Illness, unspecified: Secondary | ICD-10-CM | POA: Diagnosis not present

## 2021-03-22 NOTE — Patient Instructions (Addendum)
Visit Information  Visit Information  PATIENT GOALS:   Goals Addressed             This Visit's Progress    Find Help in My Community       Timeframe:  Long-Range Goal Priority:  High Start Date:    03/22/21                         Expected End Date:   10/22/20                  Follow Up Date 04/06/2021    - begin a notebook of services in my neighborhood or community - follow-up on any referrals for help I am given - think ahead to make sure my need does not become an emergency - have a back-up plan - make a list of family or friends that I can call    Why is this important?   Knowing how and where to find help for yourself or family in your neighborhood and community is an important skill.  You will want to take some steps to learn how.    Notes:         Patricia Galloway was given information about Care Management services by the embedded care coordination team including:  Care Management services include personalized support from designated clinical staff supervised by her physician, including individualized plan of care and coordination with other care providers 24/7 contact phone numbers for assistance for urgent and routine care needs. The patient may stop CCM services at any time (effective at the end of the month) by phone call to the office staff.  Patient agreed to services and verbal consent obtained.   The patient verbalized understanding of instructions, educational materials, and care plan provided today and declined offer to receive copy of patient instructions, educational materials, and care plan.   Telephone follow up appointment with care management team member scheduled for: 04/06/21  Elliot Gurney, [place] Worker  [facility] Care Management [phone]

## 2021-03-22 NOTE — Chronic Care Management (AMB) (Addendum)
Chronic Care Management    Clinical Social Work Note  03/22/2021 Name: Patricia Galloway MRN: [id number] DOB: Mar 08, 1951  Patricia Galloway is a 70 y.o. year old female who is a primary care patient of [name], MD. The CCM team was consulted to assist the patient with chronic disease management and/or care coordination needs related to: [employer]  and Caregiver Stress.   Engaged with patient by telephone for initial visit in response to provider referral for social work chronic care management and care coordination services.   Consent to Services:  The patient was given information about Chronic Care Management services, agreed to services, and gave verbal consent prior to initiation of services.  Please see initial visit note for detailed documentation.   Patient agreed to services and consent obtained.   Assessment: Review of patient past medical history, allergies, medications, and health status, including review of relevant consultants reports was performed today as part of a comprehensive evaluation and provision of chronic care management and care coordination services.     SDOH (Social Determinants of Health) assessments and interventions performed:  SDOH Interventions    Flowsheet Row Most Recent Value  SDOH Interventions   SDOH Interventions for the Following Domains Stress  Stress Interventions Provide Counseling        Advanced Directives Status: Not addressed in this encounter.  CCM Care Plan  Allergies  Allergen Reactions   Penicillins Anaphylaxis    Has patient had a PCN reaction causing immediate rash, facial/tongue/throat swelling, SOB or lightheadedness with hypotension: Yes Has patient had a PCN reaction causing severe rash involving mucus membranes or skin necrosis: No Has patient had a PCN reaction that required hospitalization: No Has patient had a PCN reaction occurring within the last 10 years: No If all of the above answers are "NO", then  may proceed with Cephalosporin use.    Codeine Nausea And Vomiting   Latex Itching   Sulfa Antibiotics Other (See Comments)    TOPICAL-SULFA CAUSED BLISTERS   Tape Other (See Comments)    Reaction:  Blisters and burning  Pt states that paper tape is okay.     Xopenex [Levalbuterol Hcl] Anxiety    Outpatient Encounter Medications as of 03/22/2021  Medication Sig Note   albuterol (VENTOLIN HFA) 108 (90 Base) MCG/ACT inhaler Inhale 2 puffs into the lungs every 6 (six) hours as needed for wheezing or shortness of breath. Patient requests generic Ventolin    amLODipine (NORVASC) 5 MG tablet Take 1 tablet (5 mg total) by mouth every evening.    benzonatate (TESSALON) 100 MG capsule Take 1-2 capsules (100-200 mg total) by mouth 2 (two) times daily as needed. 12/22/2020: Pt is taking PRN    BREO ELLIPTA 200-25 MCG/INH AEPB INHALE 1 PUFF BY MOUTH EVERY DAY    Cholecalciferol (VITAMIN D3) 250 MCG (10000 UT) TABS Take 1 tablet by mouth 2 (two) times a week.    diazepam (VALIUM) 5 MG tablet Take 1 tablet (5 mg total) by mouth daily as needed for anxiety. 30 minutes before doctor's visit 12/22/2020: Pt is taking PRN    montelukast (SINGULAIR) 10 MG tablet Take 1 tablet (10 mg total) by mouth at bedtime.    Multiple Vitamin (MULTIVITAMIN) capsule Take 2 capsules by mouth daily. 12/22/2020: Pt is taking and just does not have any on hand right now    OVER THE COUNTER MEDICATION Take 1 tablet by mouth 2 (two) times daily. Nutri-Calm 09/11/2018: Only with radiation   OVER THE COUNTER MEDICATION  Life extension bone strength / life extension skin restore ceramide / life extension super bio curcumin    valsartan (DIOVAN) 160 MG tablet Take 1 tablet (160 mg total) by mouth daily.    No facility-administered encounter medications on file as of 03/22/2021.    Patient Active Problem List   Diagnosis Date Noted   Pain in limb 03/29/2017   Basal cell carcinoma 03/29/2017   Thoracic aortic aneurysm without rupture  ([place]) 10/31/2016   Atherosclerosis of aorta ([place]) 10/31/2016   Chronic pancreatitis ([place]) 10/31/2016   Pulmonary nodule 10/31/2016   Centrilobular emphysema ([place]) 10/31/2016   Iron deficiency anemia due to chronic blood loss 10/24/2016   Basal cell carcinoma, leg, right 08/24/2016   Hyperglycemia 08/01/2016   Chronic thoracic back pain 08/04/2015   Panic attack 08/04/2015   White coat syndrome without diagnosis of hypertension 08/04/2015   Major depression, recurrent ([place]) 08/04/2015   Asthma, moderate persistent, poorly-controlled 08/04/2015    Conditions to be addressed/monitored: Depression; Limited social support and Lacks knowledge of community resource: related to in home assistance for spouse  Care Plan : General Social Work (Adult)  Updates made by Vern Claude, LCSW since 03/22/2021 12:00 AM     Problem: Caregiver Stress      Goal: Caregiver Coping Optimized   Start Date: 03/22/2021  Expected End Date: 10/22/2021  This Visit's Progress: On track  Priority: High  Note:   Current Barriers:  Chronic Mental Health needs related to  upcoming surgery and possible need for in home care for herself and her spouse Financial constraints related to limited income, Limited social support, and Limited access to caregiver Suicidal Ideation/Homicidal Ideation: No  Clinical Social Work Goal(s):  Over the next 90 days, patient will work with SW bi-weekly by telephone or in person to reduce or management anxiety/stress related to arranging care for her spouse during her hospitalization  Interventions: Patient interviewed and appropriate assessments performed: PHQ 2 Confirmed that patient's spouse is currently receiving cancer treatments and requires in home assistance SDOH Interventions    Flowsheet Row Most Recent Value  SDOH Interventions   SDOH Interventions for the Following Domains Stress  Stress Interventions Provide Counseling     Confirmed that patient now has to have  a surgical procedure and has concerns about the daily care of her spouse Confirmed that patient would like to explore options for patient care in preparation for her absence.  Alternatives for care explored including family and friends, private pay options-possible support from the [place] explored as well Collaboration phone call to [employer] social worker -Elease Etienne for any possible assistance Patient current stress management skills explored-eating healthy, medication adherence, deep breathing Active listening / Reflection utilized Emotional Support Provided-positive coping strategies reinforced  Caregiver stress acknowledged   Patient Self Care Activities:  Performs ADL's independently Performs IADL's independently  Patient Coping Strengths:  Hopefulness Self Advocate Able to Communicate Effectively  Patient Self Care Deficits:  Knowledge deficit of available resource for in home care  Initial goal documentation       Task: Recognize and Manage Caregiver Stress        Follow Up Plan: SW will follow up with patient by phone over the next 14 business days      [place], [place] Worker  [facility] [phone]

## 2021-03-22 NOTE — Chronic Care Management (AMB) (Addendum)
Chronic Care Management    Clinical Social Work Note  03/22/2021 Name: Patricia Galloway MRN: [id number] DOB: [birth year]  Patricia Galloway is a 70 y.o. year old female who is a primary care patient of Patricia Sizer, Patricia Galloway. The CCM team was consulted to assist the patient with chronic disease management and/or care coordination needs related to: [employer] .   Collaboration with Patricia Galloway, [place] Worker  for  resources for in home care  in response to provider referral for social work chronic care management and care coordination services.   Consent to Services:  The patient was given information about Chronic Care Management services, agreed to services, and gave verbal consent prior to initiation of services.  Please see initial visit note for detailed documentation.   Patient agreed to services and consent obtained.   Assessment: Review of patient past medical history, allergies, medications, and health status, including review of relevant consultants reports was performed today as part of a comprehensive evaluation and provision of chronic care management and care coordination services.     SDOH (Social Determinants of Health) assessments and interventions performed:    Advanced Directives Status: Not addressed in this encounter.  CCM Care Plan  Allergies  Allergen Reactions   Penicillins Anaphylaxis    Has patient had a PCN reaction causing immediate rash, facial/tongue/throat swelling, SOB or lightheadedness with hypotension: Yes Has patient had a PCN reaction causing severe rash involving mucus membranes or skin necrosis: No Has patient had a PCN reaction that required hospitalization: No Has patient had a PCN reaction occurring within the last 10 years: No If all of the above answers are "NO", then may proceed with Cephalosporin use.    Codeine Nausea And Vomiting   Latex Itching   Sulfa Antibiotics Other (See Comments)    TOPICAL-SULFA CAUSED BLISTERS    Tape Other (See Comments)    Reaction:  Blisters and burning  Pt states that paper tape is okay.     Xopenex [Levalbuterol Hcl] Anxiety    Outpatient Encounter Medications as of 03/22/2021  Medication Sig Note   albuterol (VENTOLIN HFA) 108 (90 Base) MCG/ACT inhaler Inhale 2 puffs into the lungs every 6 (six) hours as needed for wheezing or shortness of breath. Patient requests generic Ventolin    amLODipine (NORVASC) 5 MG tablet Take 1 tablet (5 mg total) by mouth every evening.    benzonatate (TESSALON) 100 MG capsule Take 1-2 capsules (100-200 mg total) by mouth 2 (two) times daily as needed. 12/22/2020: Pt is taking PRN    BREO ELLIPTA 200-25 MCG/INH AEPB INHALE 1 PUFF BY MOUTH EVERY DAY    Cholecalciferol (VITAMIN D3) 250 MCG (10000 UT) TABS Take 1 tablet by mouth 2 (two) times a week.    diazepam (VALIUM) 5 MG tablet Take 1 tablet (5 mg total) by mouth daily as needed for anxiety. 30 minutes before doctor's visit 12/22/2020: Pt is taking PRN    montelukast (SINGULAIR) 10 MG tablet Take 1 tablet (10 mg total) by mouth at bedtime.    Multiple Vitamin (MULTIVITAMIN) capsule Take 2 capsules by mouth daily. 12/22/2020: Pt is taking and just does not have any on hand right now    OVER THE COUNTER MEDICATION Take 1 tablet by mouth 2 (two) times daily. Nutri-Calm 09/11/2018: Only with radiation   OVER THE COUNTER MEDICATION Life extension bone strength / life extension skin restore ceramide / life extension super bio curcumin    valsartan (DIOVAN) 160 MG tablet Take 1  tablet (160 mg total) by mouth daily.    No facility-administered encounter medications on file as of 03/22/2021.    Patient Active Problem List   Diagnosis Date Noted   Pain in limb 03/29/2017   Basal cell carcinoma 03/29/2017   Thoracic aortic aneurysm without rupture ([place]) 10/31/2016   Atherosclerosis of aorta ([place]) 10/31/2016   Chronic pancreatitis ([place]) 10/31/2016   Pulmonary nodule 10/31/2016   Centrilobular emphysema ([place])  10/31/2016   Iron deficiency anemia due to chronic blood loss 10/24/2016   Basal cell carcinoma, leg, right 08/24/2016   Hyperglycemia 08/01/2016   Chronic thoracic back pain 08/04/2015   Panic attack 08/04/2015   White coat syndrome without diagnosis of hypertension 08/04/2015   Major depression, recurrent ([place]) 08/04/2015   Asthma, moderate persistent, poorly-controlled 08/04/2015    Conditions to be addressed/monitored: Depression; Limited access to caregiver  Care Plan : General Social Work (Adult)  Updates made by Patricia Claude, Patricia Galloway since 03/22/2021 12:00 AM     Problem: Caregiver Stress      Goal: Caregiver Coping Optimized   Start Date: 03/22/2021  Expected End Date: 10/22/2021  Recent Progress: On track  Priority: High  Note:   Current Barriers:  Chronic Mental Health needs related to  upcoming surgery and possible need for in home care for herself and her spouse Financial constraints related to limited income, Limited social support, and Limited access to caregiver Suicidal Ideation/Homicidal Ideation: No  Clinical Social Work Goal(s):  Over the next 90 days, patient will work with SW bi-weekly by telephone or in person to reduce or management anxiety/stress related to arranging care for her spouse during her hospitalization  Interventions: Patient interviewed and appropriate assessments performed: PHQ 2 Confirmed that patient's spouse is currently receiving cancer treatments and requires in home assistance SDOH Interventions    Flowsheet Row Most Recent Value  SDOH Interventions   SDOH Interventions for the Following Domains Stress  Stress Interventions Provide Counseling     Confirmed that patient now has to have a surgical procedure and has concerns about the daily care of her spouse Confirmed that patient would like to explore options for patient care in preparation for her absence.  Alternatives for care explored including family and friends, private pay  options-possible support from the [place] explored as well Collaboration phone call to [employer] social worker -Patricia Galloway for any possible assistance Patient current stress management skills explored-eating healthy, medication adherence, deep breathing Active listening / Reflection utilized Emotional Support Provided-positive coping strategies reinforced  Caregiver stress acknowledged  4:16 pm Return call from the social worker at the [employer] to discuss any resources for in home care for patient's spouse-per Patricia Galloway no services are available outside of self pay options at this time.  Patient Self Care Activities:  Performs ADL's independently Performs IADL's independently  Patient Coping Strengths:  Hopefulness Self Advocate Able to Communicate Effectively  Patient Self Care Deficits:  Knowledge deficit of available resource for in home care  Initial goal documentation       Task: Recognize and Manage Caregiver Stress        Follow Up Plan: SW will follow up with patient by phone over the next 7-14 business days      [place], [place] Worker  [facility] [phone]

## 2021-03-23 ENCOUNTER — Ambulatory Visit: Payer: Medicare HMO | Admitting: *Deleted

## 2021-03-23 DIAGNOSIS — F325 Major depressive disorder, single episode, in full remission: Secondary | ICD-10-CM

## 2021-03-23 DIAGNOSIS — C44712 Basal cell carcinoma of skin of right lower limb, including hip: Secondary | ICD-10-CM

## 2021-03-23 NOTE — Patient Instructions (Signed)
Visit Information   Goals Addressed             This Visit's Progress    Find Help in My Community       Timeframe:  Long-Range Goal Priority:  High Start Date:    03/22/21                         Expected End Date:   10/22/20                  Follow Up Date 04/06/2021    - begin a notebook of services in my neighborhood or community - follow-up on any referrals for help I am given - think ahead to make sure my need does not become an emergency - have a back-up plan - make a list of family or friends that I can call  -contact your case management program through your insurance company for any possible resources   Why is this important?   Knowing how and where to find help for yourself or family in your neighborhood and community is an important skill.  You will want to take some steps to learn how.    Notes:         The patient verbalized understanding of instructions, educational materials, and care plan provided today and declined offer to receive copy of patient instructions, educational materials, and care plan.   Telephone follow up appointment with care management team member scheduled for: 04/16/21  Elliot Gurney, [place] Worker  [facility] [phone]

## 2021-03-23 NOTE — Chronic Care Management (AMB) (Addendum)
Chronic Care Management    Clinical Social Work Note  03/23/2021 Name: Patricia Galloway MRN: [id number] DOB: 1951/07/06  Patricia Galloway is a 70 y.o. year old female who is a primary care patient of [name], MD. The CCM team was consulted to assist the patient with chronic disease management and/or care coordination needs related to: [employer] .   Engaged with patient by telephone for follow up visit in response to provider referral for social work chronic care management and care coordination services.   Consent to Services:  The patient was given information about Chronic Care Management services, agreed to services, and gave verbal consent prior to initiation of services.  Please see initial visit note for detailed documentation.   Patient agreed to services and consent obtained.   Assessment: Review of patient past medical history, allergies, medications, and health status, including review of relevant consultants reports was performed today as part of a comprehensive evaluation and provision of chronic care management and care coordination services.     SDOH (Social Determinants of Health) assessments and interventions performed:    Advanced Directives Status: Not addressed in this encounter.  CCM Care Plan  Allergies  Allergen Reactions   Penicillins Anaphylaxis    Has patient had a PCN reaction causing immediate rash, facial/tongue/throat swelling, SOB or lightheadedness with hypotension: Yes Has patient had a PCN reaction causing severe rash involving mucus membranes or skin necrosis: No Has patient had a PCN reaction that required hospitalization: No Has patient had a PCN reaction occurring within the last 10 years: No If all of the above answers are "NO", then may proceed with Cephalosporin use.    Codeine Nausea And Vomiting   Latex Itching   Sulfa Antibiotics Other (See Comments)    TOPICAL-SULFA CAUSED BLISTERS   Tape Other (See Comments)    Reaction:   Blisters and burning  Pt states that paper tape is okay.     Xopenex [Levalbuterol Hcl] Anxiety    Outpatient Encounter Medications as of 03/23/2021  Medication Sig Note   albuterol (VENTOLIN HFA) 108 (90 Base) MCG/ACT inhaler Inhale 2 puffs into the lungs every 6 (six) hours as needed for wheezing or shortness of breath. Patient requests generic Ventolin    amLODipine (NORVASC) 5 MG tablet Take 1 tablet (5 mg total) by mouth every evening.    benzonatate (TESSALON) 100 MG capsule Take 1-2 capsules (100-200 mg total) by mouth 2 (two) times daily as needed. 12/22/2020: Pt is taking PRN    BREO ELLIPTA 200-25 MCG/INH AEPB INHALE 1 PUFF BY MOUTH EVERY DAY    Cholecalciferol (VITAMIN D3) 250 MCG (10000 UT) TABS Take 1 tablet by mouth 2 (two) times a week.    diazepam (VALIUM) 5 MG tablet Take 1 tablet (5 mg total) by mouth daily as needed for anxiety. 30 minutes before doctor's visit 12/22/2020: Pt is taking PRN    montelukast (SINGULAIR) 10 MG tablet Take 1 tablet (10 mg total) by mouth at bedtime.    Multiple Vitamin (MULTIVITAMIN) capsule Take 2 capsules by mouth daily. 12/22/2020: Pt is taking and just does not have any on hand right now    OVER THE COUNTER MEDICATION Take 1 tablet by mouth 2 (two) times daily. Nutri-Calm 09/11/2018: Only with radiation   OVER THE COUNTER MEDICATION Life extension bone strength / life extension skin restore ceramide / life extension super bio curcumin    valsartan (DIOVAN) 160 MG tablet Take 1 tablet (160 mg total) by mouth daily.  No facility-administered encounter medications on file as of 03/23/2021.    Patient Active Problem List   Diagnosis Date Noted   Pain in limb 03/29/2017   Basal cell carcinoma 03/29/2017   Thoracic aortic aneurysm without rupture ([place]) 10/31/2016   Atherosclerosis of aorta ([place]) 10/31/2016   Chronic pancreatitis ([place]) 10/31/2016   Pulmonary nodule 10/31/2016   Centrilobular emphysema ([place]) 10/31/2016   Iron deficiency anemia due  to chronic blood loss 10/24/2016   Basal cell carcinoma, leg, right 08/24/2016   Hyperglycemia 08/01/2016   Chronic thoracic back pain 08/04/2015   Panic attack 08/04/2015   White coat syndrome without diagnosis of hypertension 08/04/2015   Major depression, recurrent ([place]) 08/04/2015   Asthma, moderate persistent, poorly-controlled 08/04/2015    Conditions to be addressed/monitored: Depression; Limited social support  Care Plan : General Social Work (Adult)  Updates made by Vern Claude, LCSW since 03/23/2021 12:00 AM     Problem: Caregiver Stress      Goal: Caregiver Coping Optimized   Start Date: 03/22/2021  Expected End Date: 10/22/2021  Recent Progress: On track  Priority: High  Note:   Current Barriers:  Chronic Mental Health needs related to  upcoming surgery and possible need for in home care for herself and her spouse Financial constraints related to limited income, Limited social support, and Limited access to caregiver Suicidal Ideation/Homicidal Ideation: No  Clinical Social Work Goal(s):  Over the next 90 days, patient will work with SW bi-weekly by telephone or in person to reduce or management anxiety/stress related to arranging care for her spouse during her hospitalization  Interventions: Patient interviewed and appropriate assessments performed: PHQ 2 Confirmed that patient's spouse is currently receiving cancer treatments and requires in home assistance SDOH Interventions    Flowsheet Row Most Recent Value  SDOH Interventions   SDOH Interventions for the Following Domains Stress  Stress Interventions Provide Counseling     Confirmed that patient's surgery date remains pending, however she continues to be proactive regarding her spouse's care while she is hospitalized Confirmed return call from [place] Social Worker -Fransico Him stating no available assistance with in home care available, patient would have to arrange this privately Continued to  explore alternatives for care including family and friends, private pay options-(no availabilities at this time) Contacting her case management program through her insurance provider also recommended for any available assistance-patient agreeable Patient current self management skills reinforced-eating healthy, medication adherence, deep breathing, positive self talk, and relying on her spirituality Active listening / Reflection utilized Emotional Support Provided-positive coping strategies reinforced  Caregiver stress acknowledged   Patient Self Care Activities:  Performs ADL's independently Performs IADL's independently  Patient Coping Strengths:  Hopefulness Self Advocate Able to Communicate Effectively  Patient Self Care Deficits:  Knowledge deficit of available resource for in home care  Please see past updates related to this goal by clicking on the "Past Updates" button in the selected goal          Follow Up Plan: SW will follow up with patient by phone over the next 14 business days      [place], [place] Worker  [facility] [phone]

## 2021-03-31 ENCOUNTER — Other Ambulatory Visit: Payer: Self-pay | Admitting: Family Medicine

## 2021-03-31 DIAGNOSIS — I10 Essential (primary) hypertension: Secondary | ICD-10-CM

## 2021-03-31 NOTE — Telephone Encounter (Signed)
Requested Prescriptions  Pending Prescriptions Disp Refills  . amLODipine (NORVASC) 5 MG tablet [Pharmacy Med Name: AMLODIPINE BESYLATE 5 MG TAB] 90 tablet 0    Sig: TAKE 1 TABLET BY MOUTH EVERY DAY IN THE EVENING     Cardiovascular:  Calcium Channel Blockers Failed - 03/31/2021  3:01 AM      Failed - Last BP in normal range    BP Readings from Last 1 Encounters:  02/04/21 (!) 146/82         Passed - Valid encounter within last 6 months    Recent Outpatient Visits          1 month ago Blood pressure check   [facility] [name], MD   5 months ago Thoracic aortic aneurysm without rupture [facility])   [facility] [name], MD   10 months ago Thoracic aortic aneurysm without rupture [facility])   [facility] [name], MD   1 year ago Centrilobular emphysema [facility])   [facility] [name], MD   2 years ago Thoracic aortic aneurysm without rupture [facility])   [facility] [name], MD      Future Appointments            In 6 days [name], MD [facility], [place]   In 4 months  [place]

## 2021-04-05 NOTE — Progress Notes (Signed)
Name: Patricia Galloway   MRN: [id number]    DOB: [birth year]   Date:04/06/2021       Progress Note  Subjective  Chief Complaint  Follow Up  HPI  Basal cell carcinoma right leg: she is doing well, s/p removal and chemotherapy. Her gait is back to normal , full rom of motion.  Under the care of Dr. Grayland Ormond . She states she has intermittent aching at the scar area, after last visit with  me she saw a new nodule, had a PET scan , biopsy and MRI femur that showed recurrence of cancer, seen by Dr. Percival Spanish ortho oncologist and is getting referred to another surgeon in [facility], surgery not scheduled yet  Lung nodules: monitored by Dr. Grayland Ormond , one new nodule on PET scan done 11/2019 and last one stable.    Hyperlipidemia:she refuses statin therapy , unchanged    HTN with white coat: bp is finally controlled without side effects, taking valsartan in am's and norvasc in pm valium prior to office visits. No chest pain or palpitation    Major Depression in remission: she is more overwhelmed, but able to cope with stress, she speak to [employer]. She is only taking valium prn only    Asthma/COPD: she has been doing well on Breo daily, also uses ventolin prn, she states singulair and tessalon perles prn. She could not tolerate Trelegy.    Atherosclerosis aorta: refuses statin therapy also not taking aspirin. Reviewed PET scan from 12/15/2020   Osteoporosis: discussed options, she was seen by Dr Gabriel Carina 03/2018 and was given Alendronate but she states never started medication, she states she has a high calcium diet and some otc supplementations. She is afraid of side effects , does not like adding medications.    Chronic Pancreatitis: found on CT abdomen and also on PET scan done on 11/06/2019 and also on pet done 12/18/2020 , denies nausea, vomiting or indigestion.   Aneurysm of ascending aorta: found on CT 2019, but not on CT 2020 and 2021,and 2024  discussed CTA or MRA, but she is not  interested on follow up   Patient Active Problem List   Diagnosis Date Noted   Pain in limb 03/29/2017   Basal cell carcinoma 03/29/2017   Thoracic aortic aneurysm without rupture ([place]) 10/31/2016   Atherosclerosis of aorta ([place]) 10/31/2016   Chronic pancreatitis ([place]) 10/31/2016   Pulmonary nodule 10/31/2016   Centrilobular emphysema ([place]) 10/31/2016   Iron deficiency anemia due to chronic blood loss 10/24/2016   Basal cell carcinoma, leg, right 08/24/2016   Hyperglycemia 08/01/2016   Chronic thoracic back pain 08/04/2015   Panic attack 08/04/2015   White coat syndrome without diagnosis of hypertension 08/04/2015   Major depression, recurrent ([place]) 08/04/2015   Asthma, moderate persistent, poorly-controlled 08/04/2015    Past Surgical History:  Procedure Laterality Date   BASAL CELL CARCINOMA EXCISION Right 03/29/2017   Procedure: EXCISION OF RIGHT LEG BASEL CELL CARCINOMA;  Surgeon: [name], DO;  Location: [place];  Service: Plastics;  Laterality: Right;   COLON SURGERY  2002   colostomy for 10 months after a perfurated sigmoid    COLOSTOMY REVERSAL  2003   EMBOLIZATION Right 10/12/2016   Procedure: Embolization;  Surgeon: [name], MD;  Location: [facility];  Service: Cardiovascular;  Laterality: Right;   etopic  [id number]   INCISION AND DRAINAGE OF WOUND Right 03/30/2017   Procedure: IRRIGATION AND DEBRIDEMENT WOUND; VAC PLACEMENT;  Surgeon: Audelia Hives  S, DO;  Location: [facility];  Service: Plastics;  Laterality: Right;   TONSILLECTOMY      Family History  Problem Relation Age of Onset   Dementia Mother    Aortic stenosis Mother    Osteoporosis Mother    Dementia Father    Diabetes Father    Hyperlipidemia Father    Hypertension Father    Aneurysm Sister    Hyperlipidemia Sister    Hypertension Sister    Diabetes Sister    Breast cancer Neg Hx     Social History   Tobacco Use   Smoking status: Former    Packs/day: 0.50     Years: 25.00    Pack years: 12.50    Types: Cigarettes    Start date: 09/05/1972    Quit date: 08/03/1998    Years since quitting: 22.6   Smokeless tobacco: Never   Tobacco comments:    smoking cessation materials not required  Substance Use Topics   Alcohol use: No    Alcohol/week: 0.0 standard drinks     Current Outpatient Medications:    albuterol (VENTOLIN HFA) 108 (90 Base) MCG/ACT inhaler, Inhale 2 puffs into the lungs every 6 (six) hours as needed for wheezing or shortness of breath. Patient requests generic Ventolin, Disp: 18 g, Rfl: 0   amLODipine (NORVASC) 5 MG tablet, TAKE 1 TABLET BY MOUTH EVERY DAY IN THE EVENING, Disp: 90 tablet, Rfl: 0   benzonatate (TESSALON) 100 MG capsule, Take 1-2 capsules (100-200 mg total) by mouth 2 (two) times daily as needed., Disp: 40 capsule, Rfl: 0   BREO ELLIPTA 200-25 MCG/INH AEPB, INHALE 1 PUFF BY MOUTH EVERY DAY, Disp: 60 each, Rfl: 2   Cholecalciferol (VITAMIN D3) 250 MCG (10000 UT) TABS, Take 1 tablet by mouth 2 (two) times a week., Disp: , Rfl:    diazepam (VALIUM) 5 MG tablet, Take 1 tablet (5 mg total) by mouth daily as needed for anxiety. 30 minutes before doctor's visit, Disp: 30 tablet, Rfl: 0   montelukast (SINGULAIR) 10 MG tablet, Take 1 tablet (10 mg total) by mouth at bedtime., Disp: 90 tablet, Rfl: 1   Multiple Vitamin (MULTIVITAMIN) capsule, Take 2 capsules by mouth daily., Disp: , Rfl:    OVER THE COUNTER MEDICATION, Take 1 tablet by mouth 2 (two) times daily. Nutri-Calm, Disp: , Rfl:    OVER THE COUNTER MEDICATION, Life extension bone strength / life extension skin restore ceramide / life extension super bio curcumin, Disp: , Rfl:    valsartan (DIOVAN) 160 MG tablet, Take 1 tablet (160 mg total) by mouth daily., Disp: 90 tablet, Rfl: 1  Allergies  Allergen Reactions   Penicillins Anaphylaxis    Has patient had a PCN reaction causing immediate rash, facial/tongue/throat swelling, SOB or lightheadedness with hypotension:  Yes Has patient had a PCN reaction causing severe rash involving mucus membranes or skin necrosis: No Has patient had a PCN reaction that required hospitalization: No Has patient had a PCN reaction occurring within the last 10 years: No If all of the above answers are "NO", then may proceed with Cephalosporin use.    Codeine Nausea And Vomiting   Latex Itching   Sulfa Antibiotics Other (See Comments)    TOPICAL-SULFA CAUSED BLISTERS   Tape Other (See Comments)    Reaction:  Blisters and burning  Pt states that paper tape is okay.     Xopenex [Levalbuterol Hcl] Anxiety    I personally reviewed active problem list, medication list, allergies, family  history, social history, health maintenance with the patient/caregiver today.   ROS  Constitutional: Negative for fever or weight change.  Respiratory: positive for cough and intermittent shortness of breath.   Cardiovascular: Negative for chest pain or palpitations.  Gastrointestinal: Negative for abdominal pain, no bowel changes.  Musculoskeletal: Negative for gait problem or joint swelling.  Skin: positive growth on right leg  Neurological: Negative for dizziness or headache.  No other specific complaints in a complete review of systems (except as listed in HPI above).   Objective  Vitals:   04/06/21 1050  BP: 136/80  Pulse: 92  Resp: 16  Temp: 98.2 F (36.8 C)  SpO2: 98%  Weight: 115 lb (52.2 kg)  Height: '5\' 3"'$  (1.6 m)    Body mass index is 20.37 kg/m.  Physical Exam  Constitutional: Patient appears well-developed and well-nourished. No distress.  HEENT: head atraumatic, normocephalic, pupils equal and reactive to light,  neck supple Cardiovascular: Normal rate, regular rhythm and normal heart sounds.  No murmur heard. No BLE edema. Pulmonary/Chest: Effort normal and breath sounds normal. No respiratory distress. Abdominal: Soft.  There is no tenderness. Skin: scar from right, redness and growth on the edge of the  site  Psychiatric: Patient has a normal mood and affect. behavior is normal. Judgment and thought content normal.   Recent Results (from the past 2160 hour(s))  Surgical pathology     Status: None   Collection Time: 01/26/21  2:17 PM  Result Value Ref Range   SURGICAL PATHOLOGY      SURGICAL PATHOLOGY CASE: [id number] PATIENT: Jacqlyn Krauss Surgical Pathology Report     Specimen Submitted: A. Thigh, right  Clinical History: History of locally advanced basal cell carcinoma of the right thigh, post resection, now with hypermetabolic right anterior thigh soft tissue mass, post image guided bx      DIAGNOSIS: A. SOFT TISSUE, RIGHT THIGH; ULTRASOUND-GUIDED BIOPSY: - DIAGNOSTIC OF MALIGNANCY. - MORPHOLOGICALLY COMPATIBLE WITH RECURRENT BASAL CELL CARCINOMA.   GROSS DESCRIPTION: A. Labeled: Right thigh Received: Formalin Collection time: 1:17 PM on 01/26/2021 Placed into formalin time: 1:17 PM on 01/26/2021 Number of needle core biopsy(s): 8 Length: Range from 0.3 to 0.9 cm Diameter: 0.1 cm Description: Received are cores of white and red soft tissue. Ink: None Entirely submitted in cassettes 1-3 with 3 cores in cassette 1, 3 cores in cassette 2, and 2 cores in cassette 3.  RB 01/26/2021  Final Diagnosis performed by Marlise Eves e, MD.   Electronically signed 01/27/2021 11:48:42AM The electronic signature indicates that the named Attending Pathologist has evaluated the specimen Technical component performed at [facility], [address] [place], [place] [address] Lab: [phone] Dir: Rush Farmer, MD, MMM  Professional component performed at [facility], [facility], [place], [place], [place] [address] Lab: [phone] Dir: Dellia Nims. Rubinas, MD       PHQ2/9: Depression screen [facility] 2/9 04/06/2021 03/22/2021 10/06/2020 08/18/2020 04/03/2020  Decreased Interest 0 0 0 0 0  Down, Depressed, Hopeless 0 0 0 0 0  PHQ - 2 Score 0 0 0 0 0  Altered  sleeping 0 - 0 - -  Tired, decreased energy 3 - 0 - -  Change in appetite 0 - 0 - -  Feeling bad or failure about yourself  0 - 0 - -  Trouble concentrating 0 - 0 - -  Moving slowly or fidgety/restless 0 - 0 - -  Suicidal thoughts 0 - 0 - -  PHQ-9 Score 3 -  0 - -  Difficult doing work/chores - - - - -  Some recent data might be hidden    phq 9 is negative   Fall Risk: Fall Risk  04/06/2021 10/06/2020 08/18/2020 05/19/2020 02/13/2020  Falls in the past year? 0 0 0 0 0  Comment - - - - -  Number falls in past yr: 0 0 0 0 0  Injury with Fall? 0 0 0 0 0  Risk for fall due to : - - No Fall Risks - -  Follow up - - Falls prevention discussed - -      Functional Status Survey: Is the patient deaf or have difficulty hearing?: No Does the patient have difficulty seeing, even when wearing glasses/contacts?: No Does the patient have difficulty concentrating, remembering, or making decisions?: No Does the patient have difficulty walking or climbing stairs?: No Does the patient have difficulty dressing or bathing?: No Does the patient have difficulty doing errands alone such as visiting a doctor's office or shopping?: No    Assessment & Plan  1. Basal cell carcinoma, leg, right  Keep follow up with oncologist   2. White coat syndrome with diagnosis of hypertension  - amLODipine (NORVASC) 5 MG tablet; Take 1 tablet (5 mg total) by mouth daily.  Dispense: 90 tablet; Refill: 1 - diazepam (VALIUM) 5 MG tablet; Take 1 tablet (5 mg total) by mouth daily as needed for anxiety. 30 minutes before doctor's visit  Dispense: 30 tablet; Refill: 0  3. COPD with asthma ([place])  - benzonatate (TESSALON) 100 MG capsule; Take 1-2 capsules (100-200 mg total) by mouth 2 (two) times daily as needed.  Dispense: 40 capsule; Refill: 0 - montelukast (SINGULAIR) 10 MG tablet; Take 1 tablet (10 mg total) by mouth at bedtime.  Dispense: 90 tablet; Refill: 1  4. Osteoporosis without current pathological fracture,  unspecified osteoporosis type   5. Thoracic aortic aneurysm without rupture ([place])  Denies statin therapy   6. Centrilobular emphysema ([facility])   7. Other chronic pancreatitis ([facility])  Unchanged   8. Recurrent major depressive disorder, in full remission ([place])

## 2021-04-06 ENCOUNTER — Other Ambulatory Visit: Payer: Self-pay

## 2021-04-06 ENCOUNTER — Encounter: Payer: Self-pay | Admitting: Family Medicine

## 2021-04-06 ENCOUNTER — Ambulatory Visit (INDEPENDENT_AMBULATORY_CARE_PROVIDER_SITE_OTHER): Payer: Medicare HMO | Admitting: Family Medicine

## 2021-04-06 VITALS — BP 136/80 | HR 92 | Temp 98.2°F | Resp 16 | Ht 63.0 in | Wt 115.0 lb

## 2021-04-06 DIAGNOSIS — I712 Thoracic aortic aneurysm, without rupture, unspecified: Secondary | ICD-10-CM

## 2021-04-06 DIAGNOSIS — F3342 Major depressive disorder, recurrent, in full remission: Secondary | ICD-10-CM

## 2021-04-06 DIAGNOSIS — J449 Chronic obstructive pulmonary disease, unspecified: Secondary | ICD-10-CM | POA: Diagnosis not present

## 2021-04-06 DIAGNOSIS — J4489 Other specified chronic obstructive pulmonary disease: Secondary | ICD-10-CM

## 2021-04-06 DIAGNOSIS — I10 Essential (primary) hypertension: Secondary | ICD-10-CM | POA: Diagnosis not present

## 2021-04-06 DIAGNOSIS — R69 Illness, unspecified: Secondary | ICD-10-CM | POA: Diagnosis not present

## 2021-04-06 DIAGNOSIS — C44712 Basal cell carcinoma of skin of right lower limb, including hip: Secondary | ICD-10-CM

## 2021-04-06 DIAGNOSIS — K861 Other chronic pancreatitis: Secondary | ICD-10-CM

## 2021-04-06 DIAGNOSIS — M810 Age-related osteoporosis without current pathological fracture: Secondary | ICD-10-CM | POA: Diagnosis not present

## 2021-04-06 DIAGNOSIS — J432 Centrilobular emphysema: Secondary | ICD-10-CM

## 2021-04-06 MED ORDER — BENZONATATE 100 MG PO CAPS: 100.0000 mg | ORAL_CAPSULE | Freq: Two times a day (BID) | ORAL | 0 refills | Status: DC | PRN

## 2021-04-06 MED ORDER — DIAZEPAM 5 MG PO TABS: 5.0000 mg | ORAL_TABLET | Freq: Every day | ORAL | 0 refills | Status: DC | PRN

## 2021-04-06 MED ORDER — MONTELUKAST SODIUM 10 MG PO TABS: 10.0000 mg | ORAL_TABLET | Freq: Every day | ORAL | 1 refills | Status: DC

## 2021-04-06 MED ORDER — AMLODIPINE BESYLATE 5 MG PO TABS: 5.0000 mg | ORAL_TABLET | Freq: Every day | ORAL | 1 refills | Status: DC

## 2021-04-13 ENCOUNTER — Telehealth: Payer: Self-pay

## 2021-04-13 NOTE — Progress Notes (Signed)
Chronic Care Management Pharmacy Assistant   Name: Patricia Galloway  MRN: [id number] DOB: 17-May-1951  Reason for Encounter: Diabetes Disease State Call   Recent office visits:  04/06/2021 [name], MD (PCP Office Visit) for Follow-up- No medication changes noted; patient instructed to return in 6 months.  Recent consult visits:  No recent consult notes  Hospital visits:  None in previous 6 months  Medications: Outpatient Encounter Medications as of 04/13/2021  Medication Sig Note   albuterol (VENTOLIN HFA) 108 (90 Base) MCG/ACT inhaler Inhale 2 puffs into the lungs every 6 (six) hours as needed for wheezing or shortness of breath. Patient requests generic Ventolin    amLODipine (NORVASC) 5 MG tablet Take 1 tablet (5 mg total) by mouth daily.    benzonatate (TESSALON) 100 MG capsule Take 1-2 capsules (100-200 mg total) by mouth 2 (two) times daily as needed.    BREO ELLIPTA 200-25 MCG/INH AEPB INHALE 1 PUFF BY MOUTH EVERY DAY    Cholecalciferol (VITAMIN D3) 250 MCG (10000 UT) TABS Take 1 tablet by mouth 2 (two) times a week.    diazepam (VALIUM) 5 MG tablet Take 1 tablet (5 mg total) by mouth daily as needed for anxiety. 30 minutes before doctor's visit    montelukast (SINGULAIR) 10 MG tablet Take 1 tablet (10 mg total) by mouth at bedtime.    Multiple Vitamin (MULTIVITAMIN) capsule Take 2 capsules by mouth daily.    OVER THE COUNTER MEDICATION Take 1 tablet by mouth 2 (two) times daily. Nutri-Calm 09/11/2018: Only with radiation   OVER THE COUNTER MEDICATION Life extension bone strength / life extension skin restore ceramide / life extension super bio curcumin    valsartan (DIOVAN) 160 MG tablet Take 1 tablet (160 mg total) by mouth daily.    No facility-administered encounter medications on file as of 04/13/2021.   Care Gaps: Zoster Vaccines- Shingrix COLONOSCOPY  Star Rating Drugs: Valsartan 160 mg last filled on 02/08/2021 for a 90-Day supply with [facility]  Reviewed  chart prior to disease state call. Spoke with patient regarding BP  Recent Office Vitals: BP Readings from Last 3 Encounters:  04/06/21 136/80  02/04/21 (!) 146/82  02/03/21 (!) 198/107   Pulse Readings from Last 3 Encounters:  04/06/21 92  02/03/21 (!) 118  01/26/21 91    Wt Readings from Last 3 Encounters:  04/06/21 115 lb (52.2 kg)  02/03/21 114 lb 6.4 oz (51.9 kg)  01/26/21 118 lb (53.5 kg)     Kidney Function Lab Results  Component Value Date/Time   CREATININE 0.60 11/06/2020 11:29 AM   CREATININE 0.76 02/13/2020 11:45 AM   CREATININE 0.80 06/17/2019 11:00 AM   CREATININE 0.75 02/12/2019 10:08 AM   GFRNONAA 81 02/13/2020 11:45 AM   GFRAA 93 02/13/2020 11:45 AM    BMP Latest Ref Rng & Units 11/06/2020 02/13/2020 06/17/2019  Glucose 65 - 99 mg/dL - 94 -  BUN 7 - 25 mg/dL - 14 -  Creatinine 0.44 - 1.00 mg/dL 0.60 0.76 0.80  BUN/Creat Ratio 6 - 22 (calc) - NOT APPLICABLE -  Sodium A999333 - 146 mmol/L - 137 -  Potassium 3.5 - 5.3 mmol/L - 4.1 -  Chloride 98 - 110 mmol/L - 99 -  CO2 20 - 32 mmol/L - 29 -  Calcium 8.6 - 10.4 mg/dL - 9.9 -    Current antihypertensive regimen:  Amlodipine 5 mg 1 tablet daily Valsartan 160 mg 1 tablet daily  How often are you checking your  Blood Pressure?  Patient does not check her Blood Pressure at home but the last PCP Office visit her BP was 136/80  Current home BP readings: N/A  What recent interventions/DTPs have been made by any provider to improve Blood Pressure control since last CPP Visit: Patient has not had any interventions from her provider but patient did report that she is finally taking all her medications as directed which resulted in her normal blood pressure when she had her visit on 04/06/2021.  Any recent hospitalizations or ED visits since last visit with CPP? No  What diet changes have been made to improve Blood Pressure Control?  Patient doesn't eat a certain diet she just cooks meals for her and her husband as she  is his primary care giver.  What exercise is being done to improve your Blood Pressure Control?  Patient is the primary care giver for her husband so she is not able to get out of the house much to do much exercise but due to her being the primary care give she does move around a lot.   Patient reports that she still has not had surgery on her leg due to her Basal cell carcinoma as it has not been scheduled at this time. She stated as far as her discomfort with the area it is not much, but after being on her feet for many hours she does have a little throbbing in the area at the end of the night. She stated she had a little pain in the area on Sunday and she feels it was due to the clothing she wore that rubbed and irritated the area, but she was able to take 1 ibuprofen and that subsided her symptoms. Patient also reports she has been speaking with the social worker but she has not been able to find a solution for someone staying with her husband once the surgery is scheduled. Patient reports that everything is an out of pocket expense in which her and her husband just can not afford at this time. She is hoping when she does go in to have the surgery that it's an over night stay in which her husband has assured her he would be okay for one night and she does have family who can call and check on him. Patient reports that she has also been coming out of pocket with medications and copays for other testing and it is becoming a financial burden on them both. I did advise that patient that if she has not applied for LIS I could assist her. She stated that the social worker is sending them a packet with some information and she is not sure if that is one of the things that the social worker is helping her with. I advised the patient that if not she can give me a call directly at [phone] and I can assist her with getting the LIS application completed.  Adherence Review: Is the patient currently on ACE/ARB  medication? Yes Does the patient have >5 day gap between last estimated fill dates? No  Patient has an upcoming appointment with [name], CPP on 05/12/2021 @ 1300 Patient has AWV scheduled for 08/24/2021 @ [place], CPA/CMA Catering manager Phone: [phone]

## 2021-04-16 ENCOUNTER — Ambulatory Visit (INDEPENDENT_AMBULATORY_CARE_PROVIDER_SITE_OTHER): Payer: Medicare HMO | Admitting: *Deleted

## 2021-04-16 DIAGNOSIS — F325 Major depressive disorder, single episode, in full remission: Secondary | ICD-10-CM | POA: Diagnosis not present

## 2021-04-16 DIAGNOSIS — I10 Essential (primary) hypertension: Secondary | ICD-10-CM | POA: Diagnosis not present

## 2021-04-16 DIAGNOSIS — C44712 Basal cell carcinoma of skin of right lower limb, including hip: Secondary | ICD-10-CM

## 2021-04-16 DIAGNOSIS — R69 Illness, unspecified: Secondary | ICD-10-CM | POA: Diagnosis not present

## 2021-04-16 NOTE — Chronic Care Management (AMB) (Signed)
Chronic Care Management    Clinical Social Work Note  04/16/2021 Name: Patricia Galloway MRN: [id number] DOB: [birth year]  Patricia Galloway is a 70 y.o. year old female who is a primary care patient of [name], MD. The CCM team was consulted to assist the patient with chronic disease management and/or care coordination needs related to: [employer] .   Engaged with patient by telephone for follow up visit in response to provider referral for social work chronic care management and care coordination services.   Consent to Services:  The patient was given information about Chronic Care Management services, agreed to services, and gave verbal consent prior to initiation of services.  Please see initial visit note for detailed documentation.   Patient agreed to services and consent obtained.   Assessment: Review of patient past medical history, allergies, medications, and health status, including review of relevant consultants reports was performed today as part of a comprehensive evaluation and provision of chronic care management and care coordination services.     SDOH (Social Determinants of Health) assessments and interventions performed:    Advanced Directives Status: Not addressed in this encounter.  CCM Care Plan  Allergies  Allergen Reactions   Penicillins Anaphylaxis    Has patient had a PCN reaction causing immediate rash, facial/tongue/throat swelling, SOB or lightheadedness with hypotension: Yes Has patient had a PCN reaction causing severe rash involving mucus membranes or skin necrosis: No Has patient had a PCN reaction that required hospitalization: No Has patient had a PCN reaction occurring within the last 10 years: No If all of the above answers are "NO", then may proceed with Cephalosporin use.    Codeine Nausea And Vomiting   Latex Itching   Sulfa Antibiotics Other (See Comments)    TOPICAL-SULFA CAUSED BLISTERS   Tape Other (See Comments)    Reaction:   Blisters and burning  Pt states that paper tape is okay.     Xopenex [Levalbuterol Hcl] Anxiety    Outpatient Encounter Medications as of 04/16/2021  Medication Sig Note   albuterol (VENTOLIN HFA) 108 (90 Base) MCG/ACT inhaler Inhale 2 puffs into the lungs every 6 (six) hours as needed for wheezing or shortness of breath. Patient requests generic Ventolin    amLODipine (NORVASC) 5 MG tablet Take 1 tablet (5 mg total) by mouth daily.    benzonatate (TESSALON) 100 MG capsule Take 1-2 capsules (100-200 mg total) by mouth 2 (two) times daily as needed.    BREO ELLIPTA 200-25 MCG/INH AEPB INHALE 1 PUFF BY MOUTH EVERY DAY    Cholecalciferol (VITAMIN D3) 250 MCG (10000 UT) TABS Take 1 tablet by mouth 2 (two) times a week.    diazepam (VALIUM) 5 MG tablet Take 1 tablet (5 mg total) by mouth daily as needed for anxiety. 30 minutes before doctor's visit    montelukast (SINGULAIR) 10 MG tablet Take 1 tablet (10 mg total) by mouth at bedtime.    Multiple Vitamin (MULTIVITAMIN) capsule Take 2 capsules by mouth daily.    OVER THE COUNTER MEDICATION Take 1 tablet by mouth 2 (two) times daily. Nutri-Calm 09/11/2018: Only with radiation   OVER THE COUNTER MEDICATION Life extension bone strength / life extension skin restore ceramide / life extension super bio curcumin    valsartan (DIOVAN) 160 MG tablet Take 1 tablet (160 mg total) by mouth daily.    No facility-administered encounter medications on file as of 04/16/2021.    Patient Active Problem List   Diagnosis Date Noted  Basal cell carcinoma 03/29/2017   Thoracic aortic aneurysm without rupture (HCC) 10/31/2016   Atherosclerosis of aorta ([place]) 10/31/2016   Chronic pancreatitis ([place]) 10/31/2016   Pulmonary nodule 10/31/2016   Centrilobular emphysema ([place]) 10/31/2016   Iron deficiency anemia due to chronic blood loss 10/24/2016   Basal cell carcinoma, leg, right 08/24/2016   Hyperglycemia 08/01/2016   Chronic thoracic back pain 08/04/2015   Panic  attack 08/04/2015   White coat syndrome without diagnosis of hypertension 08/04/2015   Major depression, recurrent ([place]) 08/04/2015   Asthma, moderate persistent, poorly-controlled 08/04/2015    Conditions to be addressed/monitored:  [place] cell carcinoma right leg, depression ; Limited social support need for community resources  Care Plan : General Social Work (Adult)  Updates made by Vern Claude, LCSW since 04/16/2021 12:00 AM     Problem: Caregiver Stress      Goal: Caregiver Coping Optimized   Start Date: 03/22/2021  Expected End Date: 10/22/2021  This Visit's Progress: On track  Recent Progress: On track  Priority: High  Note:   Current Barriers:  Chronic Mental Health needs related to  upcoming surgery and possible need for in home care for herself and her spouse Financial constraints related to limited income, Limited social support, and Limited access to caregiver Suicidal Ideation/Homicidal Ideation: No  Clinical Social Work Goal(s):  Over the next 90 days, patient will work with SW bi-weekly by telephone or in person to reduce or management anxiety/stress related to arranging care for her spouse during her hospitalization  Interventions: Patient interviewed and appropriate assessments performed: PHQ 2 Confirmed that patient's spouse is currently receiving cancer treatments and requires in home assistance SDOH Interventions    Flowsheet Row Most Recent Value  SDOH Interventions   SDOH Interventions for the Following Domains Stress  Stress Interventions Provide Counseling     Confirmed that patient's surgery date has been put on hold which will allow more time to take care of herself and make pans for her spouse Patient confirmed that her medical condition has not interfered with her ability "to get things done" which she appreciates Patient confirmed that she has not contacted  her case management program through her insurance provider also recommended for any  available assistance to date but patient's spouse is agreeable to the case management program through his providers office-appointment scheduled for 04/19/21 Financial concerns discussed related to medical and medication costs-patient also [facility] to assess for any additional assistance Emotional Support and reassurance provided   Patient Self Care Activities:  Performs ADL's independently Performs IADL's independently  Patient Coping Strengths:  Hopefulness Self Advocate Able to Communicate Effectively  Patient Self Care Deficits:  Knowledge deficit of available resource for in home care  Please see past updates related to this goal by clicking on the "Past Updates" button in the selected goal          Follow Up Plan: SW will follow up with patient by phone over the next 30 business days      Elliot Gurney, [place] Worker  [facility] Care Management [phone]

## 2021-04-16 NOTE — Patient Instructions (Signed)
Visit Information   Goals Addressed             This Visit's Progress    Find Help in My Community       Timeframe:  Long-Range Goal Priority:  High Start Date:    03/22/21                         Expected End Date:   10/22/20                  Follow Up Date 09/16/202  - follow-up on any referrals for help I am given - think ahead to make sure my need does not become an emergency - have a back-up plan - make a list of family or friends that I can call     Why is this important?   Knowing how and where to find help for yourself or family in your neighborhood and community is an important skill.  You will want to take some steps to learn how.    Notes:         The patient verbalized understanding of instructions, educational materials, and care plan provided today and declined offer to receive copy of patient instructions, educational materials, and care plan.   Telephone follow up appointment with care management team member scheduled for:05/21/21  Elliot Gurney, [place] Worker  [facility] [phone]

## 2021-04-22 ENCOUNTER — Telehealth: Payer: Self-pay

## 2021-04-22 NOTE — Progress Notes (Signed)
    Chronic Care Management Pharmacy Assistant   Name: Patricia Galloway  MRN: [id number] DOB: 1950-12-29  Spoke with the patient today and during our conversation the patient mentioned she was thinking about switching to [facility] to have her medications delivered as it would be much easier for her.  I completed a cost analysis for the difference of her [facility] or [facility]. In the review of the cost comparison on Medicare.gov [facility] is a preferred pharmacy and she would be okay with switching if she chooses to.  I sent a message to [name], CPP informing him of the cost comparison and what the results were. I will reach out to the patient to advise her to see if she would like to move forward in making the switch to [place], [place] Pharmacist Assistant Phone: [phone]

## 2021-05-06 ENCOUNTER — Telehealth: Payer: Self-pay

## 2021-05-06 NOTE — Progress Notes (Signed)
    Chronic Care Management Pharmacy Assistant   Name: Eirini Mcclurkin  MRN: [id number] DOB: [birth year]  Patient Assistant Application for [employer]  I spoke with this patient on 05/04/2021 regarding her spouse when she advised me that she was taking Breo Ellipta 200-25 MCG and that this medication was a little expensive for the patient. I informed her at that time that I would complete a patient assistance application for her and have [name], CPP mail it to her home address. Patient informed once she receives the application in the mail that she would need to provider her income as well as her out of pocket expenses for the year regarding her medications. I informed the patient she could [facility] and have them print a financial statement for her as she does need to have spent at least $600.00 on prescription medicines through Medicare Part D. Patient verbalized understanding and also provided her with my direct number with any additional questions she may have.  Application has been emailed to [employer], CPP for mailing to the patient's home and all excel sheets have been updated with this information.   Medications: Outpatient Encounter Medications as of 05/06/2021  Medication Sig Note   albuterol (VENTOLIN HFA) 108 (90 Base) MCG/ACT inhaler Inhale 2 puffs into the lungs every 6 (six) hours as needed for wheezing or shortness of breath. Patient requests generic Ventolin    amLODipine (NORVASC) 5 MG tablet Take 1 tablet (5 mg total) by mouth daily.    benzonatate (TESSALON) 100 MG capsule Take 1-2 capsules (100-200 mg total) by mouth 2 (two) times daily as needed.    BREO ELLIPTA 200-25 MCG/INH AEPB INHALE 1 PUFF BY MOUTH EVERY DAY    Cholecalciferol (VITAMIN D3) 250 MCG (10000 UT) TABS Take 1 tablet by mouth 2 (two) times a week.    diazepam (VALIUM) 5 MG tablet Take 1 tablet (5 mg total) by mouth daily as needed for anxiety. 30 minutes before doctor's visit    montelukast (SINGULAIR) 10 MG  tablet Take 1 tablet (10 mg total) by mouth at bedtime.    Multiple Vitamin (MULTIVITAMIN) capsule Take 2 capsules by mouth daily.    OVER THE COUNTER MEDICATION Take 1 tablet by mouth 2 (two) times daily. Nutri-Calm 09/11/2018: Only with radiation   OVER THE COUNTER MEDICATION Life extension bone strength / life extension skin restore ceramide / life extension super bio curcumin    valsartan (DIOVAN) 160 MG tablet Take 1 tablet (160 mg total) by mouth daily.    No facility-administered encounter medications on file as of 05/06/2021.   [name], CPA/CMA Clinical Pharmacist Assistant Phone: [phone]

## 2021-05-08 ENCOUNTER — Other Ambulatory Visit: Payer: Self-pay | Admitting: Family Medicine

## 2021-05-08 DIAGNOSIS — I10 Essential (primary) hypertension: Secondary | ICD-10-CM

## 2021-05-08 NOTE — Telephone Encounter (Signed)
Requested medication (s) are due for refill today: yes  Requested medication (s) are on the active medication list: yes  Last refill:  10/06/20 #90 1 RF  Future visit scheduled: yes  Notes to clinic:  abnormal lab work (potassium)   Requested Prescriptions  Pending Prescriptions Disp Refills   valsartan (DIOVAN) 160 MG tablet [Pharmacy Med Name: VALSARTAN 160 MG TABLET] 90 tablet 1    Sig: TAKE 1 TABLET BY MOUTH EVERY DAY     Cardiovascular:  Angiotensin Receptor Blockers Failed - 05/08/2021  2:29 PM      Failed - Cr in normal range and within 180 days    Creat  Date Value Ref Range Status  02/13/2020 0.76 0.50 - 0.99 mg/dL Final    Comment:    For patients >84 years of age, the reference limit for Creatinine is approximately 13% higher for people identified as African-American. .    Creatinine, Ser  Date Value Ref Range Status  11/06/2020 0.60 0.44 - 1.00 mg/dL Final   Creatinine, Urine  Date Value Ref Range Status  02/13/2020 32 20 - 275 mg/dL Final          Failed - K in normal range and within 180 days    Potassium  Date Value Ref Range Status  02/13/2020 4.1 3.5 - 5.3 mmol/L Final          Passed - Patient is not pregnant      Passed - Last BP in normal range    BP Readings from Last 1 Encounters:  04/06/21 136/80          Passed - Valid encounter within last 6 months    Recent Outpatient Visits           1 month ago Basal cell carcinoma, leg, right   [facility] [name], MD   3 months ago Blood pressure check   [facility] [name], MD   7 months ago Thoracic aortic aneurysm without rupture [facility])   [facility] [name], MD   11 months ago Thoracic aortic aneurysm without rupture [facility])   [facility] [name], MD   1 year ago Centrilobular emphysema [facility])   [facility] [name], MD       Future Appointments              In 3 months  [facility], [place]   In 5 months [name], MD [facility], [facility]

## 2021-05-11 ENCOUNTER — Other Ambulatory Visit: Payer: Self-pay

## 2021-05-11 ENCOUNTER — Telehealth: Payer: Self-pay

## 2021-05-11 NOTE — Progress Notes (Signed)
    Chronic Care Management Pharmacy Assistant   Name: Aashna Macha  MRN: [id number] DOB: 01/14/1951  Patient called to be reminded of her appointment with [name], CPP on 05/12/2021 '@1300'$  via telephone.  No answer, left message of appointment date, time and type of appointment (either telephone or in person). Left message to have all medications, supplements, blood pressure and/or blood sugar logs available during appointment and to return call if need to reschedule.  Are you having any problems obtaining your medications? Yes her Memory Dance is expensive a PAP was completed and sent to [name], CPP for mailing.    Star Rating Drug: Valsartan 160 mg last filled on 02/08/2021 for [facility]Day [facility]  Any gaps in medications fill history? No  Care Gaps: COVID-19 Vaccine Zoster Vaccines Colonoscopy   [name], CPA/CMA Clinical Pharmacist Assistant Phone: [phone]

## 2021-05-12 ENCOUNTER — Ambulatory Visit (INDEPENDENT_AMBULATORY_CARE_PROVIDER_SITE_OTHER): Payer: Medicare HMO

## 2021-05-12 DIAGNOSIS — I10 Essential (primary) hypertension: Secondary | ICD-10-CM

## 2021-05-12 DIAGNOSIS — J4540 Moderate persistent asthma, uncomplicated: Secondary | ICD-10-CM

## 2021-05-12 DIAGNOSIS — M810 Age-related osteoporosis without current pathological fracture: Secondary | ICD-10-CM

## 2021-05-12 NOTE — Patient Instructions (Signed)
Visit Information It was great speaking with you today!  Please let me know if you have any questions about our visit.   Goals Addressed             This Visit's Progress    Manage My Medicine       Timeframe:  Long-Range Goal Priority:  High Start Date:   11/05/2020                          Expected End Date:  05/13/2022                     Follow Up Date 07/2021    - call for medicine refill 2 or 3 days before it runs out - keep a list of all the medicines I take; vitamins and herbals too - use a pillbox to sort medicine    Why is this important?   These steps will help you keep on track with your medicines.   Notes:         Patient Care Plan: General Pharmacy (Adult)     Problem Identified: Hypertension, Hyperlipidemia, Asthma and Osteoporosis   Priority: High     Long-Range Goal: Patient-Specific Goal   Start Date: 11/05/2020  Expected End Date: 05/12/2022  This Visit's Progress: On track  Recent Progress: On track  Priority: High  Note:   Current Barriers:  Unable to achieve control of cholesterol  Pharmacist Clinical Goal(s):  Over the next 90 days, patient will achieve control of cholesterol as evidenced by LDL less than 100 through collaboration with PharmD and provider.  Prevent bone fractures  Interventions: 1:1 collaboration with [name], MD regarding development and update of comprehensive plan of care as evidenced by provider attestation and co-signature Inter-disciplinary care team collaboration (see longitudinal plan of care) Comprehensive medication review performed; medication list updated in electronic medical record  Hypertension (BP goal <140/90) -Controlled -Current treatment: Amlodipine 5 mg daily Valsartan 160 mg daily   -Medications previously tried: NA  -Current home readings: NA -Current dietary habits: Patient does not follow any specific dietary regimen -Current exercise habits: Patient does not follow any specific exercise  regimen, and has been busy trying to care for her husband.  -Patient with severe history of white coat hypertension.  -Denies hypotensive/hypertensive symptoms -Recommended to continue current medication  Hyperlipidemia: (LDL goal < 70) -Uncontrolled -Current treatment: None -Medications previously tried: None  -Educated on Benefits of statin for ASCVD risk reduction; patient still not willing to think about starting new treatment at this time.  -Counseled on diet and exercise extensively  Asthma(Goal: control symptoms and prevent exacerbations) -Controlled -Current treatment  Albuterol HFA  Benzonatate 100 mg 1-2 capsules twice daily  Breo Ellipta 1 puff daily  Montelukast 10 mg nightly  -Medications previously tried: NA  -Pulmonary function testing: NA -Exacerbations requiring treatment in last 6 months: None -Patient reports consistent use of maintenance inhaler -Frequency of rescue inhaler use: sporadically -Counseled on Benefits of consistent maintenance inhaler use -Recommended to continue current medication  Osteoporosis (Goal improve bone density and prevent fractures) -Uncontrolled -Last DEXA Scan: 10/21/19   T-Score femoral neck: -3.8  T-Score total hip: NA  T-Score lumbar spine: -3.9  T-Score forearm radius: -3.6  10-year probability of major osteoporotic fracture: NA  10-year probability of hip fracture: NA -Patient is a candidate for pharmacologic treatment due to T-Score < -2.5 in femoral neck and T-Score < -2.5 in lumbar spine -  Current treatment  Life extension Ultimate Bone Strength twice daily  Vitamin D 10,000 units twice weekly  -Medications previously tried: NA  -Patient still hesitant to begin osteoporosis treatment. Discussed in details risks of fracture. -Recommend weight-bearing and muscle strengthening exercises for building and maintaining bone density.  -Recommended to continue current medication  Patient Goals/Self-Care Activities Over the  next 90 days, patient will:  - check blood pressure weekly, document, and provide at future appointments target a minimum of 150 minutes of moderate intensity exercise weekly  Follow Up Plan: Telephone follow up appointment with care management team member scheduled for:  11/10/2021 at 3:45 PM     Patient agreed to services and verbal consent obtained.   Patient verbalizes understanding of instructions provided today and agrees to view in [place].   Malva Limes, [facility] [phone]

## 2021-05-12 NOTE — Progress Notes (Signed)
Chronic Care Management Pharmacy Note  05/12/2021 Name:  Patricia Galloway MRN:  [id number] DOB:  [birth year]  Subjective: Patricia Galloway is an 70 y.o. year old female who is a primary patient of [name], MD.  The CCM team was consulted for assistance with disease management and care coordination needs.    Engaged with patient by telephone for follow up visit in response to provider referral for pharmacy case management and/or care coordination services.   Consent to Services:  The patient was given information about Chronic Care Management services, agreed to services, and gave verbal consent prior to initiation of services.  Please see initial visit note for detailed documentation.   Patient Care Team: [name], MD as PCP - General (Family Medicine) Lloyd Huger, MD as Consulting Physician (Oncology) Dillingham, Loel Lofty, DO as Attending Physician (Plastic Surgery) Germaine Pomfret, [facility] as Pharmacist (Pharmacist) Vern Claude, [place] as Social Worker  Recent office visits: 10/06/20: Patient presented to Dr. Ancil Boozer for follow-up. Adherence issues with exforge, switched to amlodipine 5 mg nightly, valsartan 160 mg AM due to dizziness.   Recent consult visits: None in previous 6 months  Hospital visits: None in previous 6 months  Objective:  Lab Results  Component Value Date   CREATININE 0.60 11/06/2020   BUN 14 02/13/2020   GFRNONAA 81 02/13/2020   GFRAA 93 02/13/2020   NA 137 02/13/2020   K 4.1 02/13/2020   CALCIUM 9.9 02/13/2020   CO2 29 02/13/2020    Lab Results  Component Value Date/Time   HGBA1C 5.2 02/13/2020 11:45 AM   HGBA1C 5.3 02/12/2019 10:08 AM   HGBA1C 5.9 08/18/2015 12:00 AM   MICROALBUR 0.7 02/13/2020 11:45 AM    Last diabetic Eye exam: No results found for: HMDIABEYEEXA  Last diabetic Foot exam: No results found for: HMDIABFOOTEX   Lab Results  Component Value Date   CHOL 274 (H) 02/13/2020   HDL 97 02/13/2020   LDLCALC  161 (H) 02/13/2020   TRIG 67 02/13/2020   CHOLHDL 2.8 02/13/2020    Hepatic Function Latest Ref Rng & Units 02/13/2020 02/11/2019 06/28/2018  Total Protein 6.1 - 8.1 g/dL 7.2 7.6 7.1  Albumin 3.5 - 5.0 g/dL - 4.4 4.3  AST 10 - 35 U/L 19 19 23   ALT 6 - 29 U/L 14 16 16   Alk Phosphatase 38 - 126 U/L - 62 61  Total Bilirubin 0.2 - 1.2 mg/dL 0.7 0.8 0.6    Lab Results  Component Value Date/Time   TSH 1.98 02/13/2020 11:45 AM   TSH 2.91 08/18/2015 12:00 AM    CBC Latest Ref Rng & Units 02/13/2020 02/11/2019 06/28/2018  WBC 3.8 - 10.8 Thousand/uL 8.2 8.8 8.1  Hemoglobin 11.7 - 15.5 g/dL 14.8 14.7 13.2  Hematocrit 35.0 - 45.0 % 44.1 43.3 39.1  Platelets 140 - 400 Thousand/uL 268 269 258    No results found for: VD25OH  Clinical ASCVD: No  The 10-year ASCVD risk score [name] DC Jr., et al., 2013) is: 12.4%   Values used to calculate the score:     Age: 62 years     Sex: Female     Is Non-Hispanic African American: No     Diabetic: No     Tobacco smoker: No     Systolic Blood Pressure: 793 mmHg     Is BP treated: Yes     HDL Cholesterol: 97 mg/dL     Total Cholesterol: 274 mg/dL    Depression screen PHQ  2/9 04/06/2021 03/22/2021 10/06/2020  Decreased Interest 0 0 0  Down, Depressed, Hopeless 0 0 0  PHQ - 2 Score 0 0 0  Altered sleeping 0 - 0  Tired, decreased energy 3 - 0  Change in appetite 0 - 0  Feeling bad or failure about yourself  0 - 0  Trouble concentrating 0 - 0  Moving slowly or fidgety/restless 0 - 0  Suicidal thoughts 0 - 0  PHQ-9 Score 3 - 0  Difficult doing work/chores - - -  Some recent data might be hidden    Social History   Tobacco Use  Smoking Status Former   Packs/day: 0.50   Years: 25.00   Pack years: 12.50   Types: Cigarettes   Start date: 09/05/1972   Quit date: 08/03/1998   Years since quitting: 22.7  Smokeless Tobacco Never  Tobacco Comments   smoking cessation materials not required   BP Readings from Last 3 Encounters:  04/06/21 136/80   02/04/21 (!) 146/82  02/03/21 (!) 198/107   Pulse Readings from Last 3 Encounters:  04/06/21 92  02/03/21 (!) 118  01/26/21 91   Wt Readings from Last 3 Encounters:  04/06/21 115 lb (52.2 kg)  02/03/21 114 lb 6.4 oz (51.9 kg)  01/26/21 118 lb (53.5 kg)    Assessment/Interventions: Review of patient past medical history, allergies, medications, health status, including review of consultants reports, laboratory and other test data, was performed as part of comprehensive evaluation and provision of chronic care management services.   SDOH:  (Social Determinants of Health) assessments and interventions performed: Yes    CCM Care Plan  Allergies  Allergen Reactions   Penicillins Anaphylaxis    Has patient had a PCN reaction causing immediate rash, facial/tongue/throat swelling, SOB or lightheadedness with hypotension: Yes Has patient had a PCN reaction causing severe rash involving mucus membranes or skin necrosis: No Has patient had a PCN reaction that required hospitalization: No Has patient had a PCN reaction occurring within the last 10 years: No If all of the above answers are "NO", then may proceed with Cephalosporin use.    Codeine Nausea And Vomiting   Latex Itching   Sulfa Antibiotics Other (See Comments)    TOPICAL-SULFA CAUSED BLISTERS   Tape Other (See Comments)    Reaction:  Blisters and burning  Pt states that paper tape is okay.     Xopenex [Levalbuterol Hcl] Anxiety    Medications Reviewed Today     Reviewed by [name], MD (Physician) on 04/06/21 at 1122  Med List Status: <None>   Medication Order Taking? Sig Documenting Provider Last Dose Status Informant  albuterol (VENTOLIN HFA) 108 (90 Base) MCG/ACT inhaler [id number] Yes Inhale 2 puffs into the lungs every 6 (six) hours as needed for wheezing or shortness of breath. Patient requests generic Shawna Clamp, MD Taking Active   amLODipine (NORVASC) 5 MG tablet [id number] Yes TAKE 1 TABLET  BY MOUTH EVERY DAY IN THE Lorelei Pont, Drue Stager, MD Taking Active   benzonatate (TESSALON) 100 MG capsule [id number] Yes Take 1-2 capsules (100-200 mg total) by mouth 2 (two) times daily as needed. [name], MD Taking Active Self           Med Note Carlene Coria   Tue Apr 06, 2021 10:49 AM)    [place] Patter 200-25 MCG/INH AEPB [id number] Yes INHALE 1 PUFF BY MOUTH EVERY DAY [name], MD Taking Active   Cholecalciferol (VITAMIN D3) 250 MCG (10000 UT) TABS [id number] Yes  Take 1 tablet by mouth 2 (two) times a week. [provider] Taking Active   diazepam (VALIUM) 5 MG tablet [id number] Yes Take 1 tablet (5 mg total) by mouth daily as needed for anxiety. 30 minutes before doctor's visit [name], MD Taking Active            Med Note Carlene Coria   Tue Apr 06, 2021 10:49 AM)    montelukast (SINGULAIR) 10 MG tablet [id number] Yes Take 1 tablet (10 mg total) by mouth at bedtime. [name], MD Taking Active Self  Multiple Vitamin (MULTIVITAMIN) capsule [id number] Yes Take 2 capsules by mouth daily. [provider] Taking Active            Med Note Carlene Coria   Tue Apr 06, 2021 10:49 AM)    OVER THE COUNTER MEDICATION [id number] Yes Take 1 tablet by mouth 2 (two) times daily. Nutri-Calm [provider] Taking Active Self           Med Note Rollene Rotunda, Clois Dupes   Tue Sep 11, 2018 11:43 AM) Only with radiation  OVER THE COUNTER MEDICATION [id number] Yes Life extension bone strength / life extension skin restore ceramide / life extension super bio curcumin [provider] Taking Active   valsartan (DIOVAN) 160 MG tablet [id number] Yes Take 1 tablet (160 mg total) by mouth daily. [name], MD Taking Active Self            Patient Active Problem List   Diagnosis Date Noted   Basal cell carcinoma 03/29/2017   Thoracic aortic aneurysm without rupture ([place]) 10/31/2016   Atherosclerosis of aorta ([place]) 10/31/2016   Chronic pancreatitis  ([place]) 10/31/2016   Pulmonary nodule 10/31/2016   Centrilobular emphysema ([place]) 10/31/2016   Iron deficiency anemia due to chronic blood loss 10/24/2016   Basal cell carcinoma, leg, right 08/24/2016   Hyperglycemia 08/01/2016   Chronic thoracic back pain 08/04/2015   Panic attack 08/04/2015   White coat syndrome without diagnosis of hypertension 08/04/2015   Major depression, recurrent ([place]) 08/04/2015   Asthma, moderate persistent, poorly-controlled 08/04/2015    Immunization History  Administered Date(s) Administered   Tdap 08/08/2016    Conditions to be addressed/monitored:  Hypertension, Hyperlipidemia, Asthma and Osteoporosis  There are no care plans that you recently modified to display for this patient.    Medication Assistance: Application for [place]  medication assistance program. in process.  Anticipated assistance start date TBD.  See plan of care for additional detail.  Patient's preferred pharmacy is:  [id number]: [phone]: [phone] Uses pill box? Yes Pt endorses 100% compliance  We discussed: Current pharmacy is preferred with insurance plan and patient is satisfied with pharmacy services Patient decided to: Continue current medication management strategy  Care Plan and Follow Up Patient Decision:  Patient agrees to Care Plan and Follow-up.  Plan: Telephone follow up appointment with care management team member scheduled for:  11/10/2021 at 3:45 PM   [phone]

## 2021-05-20 ENCOUNTER — Other Ambulatory Visit: Payer: Self-pay | Admitting: Family Medicine

## 2021-05-20 DIAGNOSIS — J449 Chronic obstructive pulmonary disease, unspecified: Secondary | ICD-10-CM

## 2021-05-20 DIAGNOSIS — J4540 Moderate persistent asthma, uncomplicated: Secondary | ICD-10-CM

## 2021-05-21 ENCOUNTER — Ambulatory Visit: Payer: Medicare HMO | Admitting: *Deleted

## 2021-05-21 DIAGNOSIS — F325 Major depressive disorder, single episode, in full remission: Secondary | ICD-10-CM

## 2021-05-21 DIAGNOSIS — C44712 Basal cell carcinoma of skin of right lower limb, including hip: Secondary | ICD-10-CM

## 2021-05-21 NOTE — Patient Instructions (Signed)
Visit Information   Goals Addressed             This Visit's Progress    Find Help in My Community       Timeframe:  Long-Range Goal Priority:  High Start Date:    03/22/21                         Expected End Date:   10/22/20                  Follow Up Date 06/04/2021  - follow-up on any referrals for help I am given - think ahead to make sure my need does not become an emergency - have a back-up plan - make a list of family or friends that I can call     Why is this important?   Knowing how and where to find help for yourself or family in your neighborhood and community is an important skill.  You will want to take some steps to learn how.    Notes:         The patient verbalized understanding of instructions, educational materials, and care plan provided today and declined offer to receive copy of patient instructions, educational materials, and care plan.   Telephone follow up appointment with care management team member scheduled for:06/04/21  Elliot Gurney, [place] Worker  [facility] Care Management [phone]

## 2021-05-21 NOTE — Chronic Care Management (AMB) (Signed)
Chronic Care Management    Clinical Social Work Note  05/21/2021 Name: Patricia Galloway MRN: [id number] DOB: 05/20/1951  Patricia Galloway is a 70 y.o. year old female who is a primary care patient of [name], MD. The CCM team was consulted to assist the patient with chronic disease management and/or care coordination needs related to: [employer]  and [place] [facility].   Engaged with patient by telephone for follow up visit in response to provider referral for social work chronic care management and care coordination services.   Consent to Services:  The patient was given information about Chronic Care Management services, agreed to services, and gave verbal consent prior to initiation of services.  Please see initial visit note for detailed documentation.   Patient agreed to services and consent obtained.   Assessment: Review of patient past medical history, allergies, medications, and health status, including review of relevant consultants reports was performed today as part of a comprehensive evaluation and provision of chronic care management and care coordination services.     SDOH (Social Determinants of Health) assessments and interventions performed:    Advanced Directives Status: Not addressed in this encounter.  CCM Care Plan  Allergies  Allergen Reactions   Penicillins Anaphylaxis    Has patient had a PCN reaction causing immediate rash, facial/tongue/throat swelling, SOB or lightheadedness with hypotension: Yes Has patient had a PCN reaction causing severe rash involving mucus membranes or skin necrosis: No Has patient had a PCN reaction that required hospitalization: No Has patient had a PCN reaction occurring within the last 10 years: No If all of the above answers are "NO", then may proceed with Cephalosporin use.    Codeine Nausea And Vomiting   Latex Itching   Sulfa Antibiotics Other (See Comments)    TOPICAL-SULFA CAUSED BLISTERS    Tape Other (See Comments)    Reaction:  Blisters and burning  Pt states that paper tape is okay.     Xopenex [Levalbuterol Hcl] Anxiety    Outpatient Encounter Medications as of 05/21/2021  Medication Sig Note   albuterol (VENTOLIN HFA) 108 (90 Base) MCG/ACT inhaler Inhale 2 puffs into the lungs every 6 (six) hours as needed for wheezing or shortness of breath. Patient requests generic Ventolin    amLODipine (NORVASC) 5 MG tablet Take 1 tablet (5 mg total) by mouth daily.    benzonatate (TESSALON) 100 MG capsule Take 1-2 capsules (100-200 mg total) by mouth 2 (two) times daily as needed.    Cholecalciferol (VITAMIN D3) 250 MCG (10000 UT) TABS Take 1 tablet by mouth 2 (two) times a week.    diazepam (VALIUM) 5 MG tablet Take 1 tablet (5 mg total) by mouth daily as needed for anxiety. 30 minutes before doctor's visit    fluticasone furoate-vilanterol (BREO ELLIPTA) 200-25 MCG/INH AEPB INHALE 1 PUFF BY MOUTH EVERY DAY    montelukast (SINGULAIR) 10 MG tablet Take 1 tablet (10 mg total) by mouth at bedtime.    Multiple Vitamin (MULTIVITAMIN) capsule Take 2 capsules by mouth daily.    OVER THE COUNTER MEDICATION Take 1 tablet by mouth 2 (two) times daily. Nutri-Calm 09/11/2018: Only with radiation   OVER THE COUNTER MEDICATION Life extension bone strength / life extension skin restore ceramide / life extension super bio curcumin    valsartan (DIOVAN) 160 MG tablet TAKE 1 TABLET BY MOUTH EVERY DAY    No facility-administered encounter medications on file as of 05/21/2021.    Patient Active Problem List  Diagnosis Date Noted   Basal cell carcinoma 03/29/2017   Thoracic aortic aneurysm without rupture ([place]) 10/31/2016   Atherosclerosis of aorta ([place]) 10/31/2016   Chronic pancreatitis ([place]) 10/31/2016   Pulmonary nodule 10/31/2016   Centrilobular emphysema ([place]) 10/31/2016   Iron deficiency anemia due to chronic blood loss 10/24/2016   Basal cell carcinoma, leg, right 08/24/2016    Hyperglycemia 08/01/2016   Chronic thoracic back pain 08/04/2015   Panic attack 08/04/2015   White coat syndrome without diagnosis of hypertension 08/04/2015   Major depression, recurrent ([place]) 08/04/2015   Asthma, moderate persistent, poorly-controlled 08/04/2015    Conditions to be addressed/monitored: Patricia Galloway cell carcinoma right leg, depression ; Limited social support need for community resources  Care Plan : General Social Work (Adult)  Updates made by Vern Claude, LCSW since 05/21/2021 12:00 AM     Problem: Caregiver Stress      Goal: Caregiver Coping Optimized   Start Date: 03/22/2021  Expected End Date: 10/22/2021  This Visit's Progress: On track  Recent Progress: On track  Priority: High  Note:   Current Barriers:  Chronic Mental Health needs related to  upcoming surgery and possible need for in home care for herself and her spouse Financial constraints related to limited income, Limited social support, and Limited access to caregiver Suicidal Ideation/Homicidal Ideation: No  Clinical Social Work Goal(s):  Over the next 90 days, patient will work with SW bi-weekly by telephone or in person to reduce or management anxiety/stress related to arranging care for her spouse during her hospitalization  Interventions: Confirmed that patient's spouse is currently receiving cancer treatments and requires in home assistance-full scan done showing no cancer-patient very grateful for this new SDOH Interventions    Flowsheet Row Most Recent Value  SDOH Interventions   SDOH Interventions for the Following Domains Stress  Stress Interventions Provide Counseling     Confirmed that patient's surgery date has been put on hold which will allow more time to take care of herself and make plans for her spouse ie preparing meals to freeze for for later Confirmed that patient has plans to  stay with family friend following her surgery to assist with her recovery before her return home  with spouse Wheelchair ramp confirmed to be installed on 05/29/21 coordinated by wheelchair ministry through [place] concerns discussed related to medical and medication costs-discussed continued work with [facility] pharmacist to obtain medications Patient confirmed that her stress level, sleep and overall state of mind has improved as spouse's health has improved and plans are being made to support her recovery following her surgery  Emotional Support and reassurance provided Positive reinforcement provided for positive coping strategies utilized  Patient Self Care Activities:  Performs ADL's independently Performs IADL's independently  Patient Coping Strengths:  Hopefulness Self Advocate Able to Communicate Effectively  Patient Self Care Deficits:  Knowledge deficit of available resource for in home care  Please see past updates related to this goal by clicking on the "Past Updates" button in the selected goal          Follow Up Plan: SW will follow up with patient by phone over the next 14 business days      Elliot Gurney, [place] Worker  [facility] Care Management [phone]

## 2021-06-02 ENCOUNTER — Telehealth: Payer: Self-pay

## 2021-06-02 NOTE — Telephone Encounter (Signed)
I have faxed a request to Dr. Margretta Ditty office on 04/22/21 and 05/03/21 request an update of plan for patient but I have not received a response.  Contacted Dr. Margretta Ditty office [facility]) via phone requesting an update on plan for patient.    The only information that could be offered to me is what is available for view in Care Everywhere.  Which consists of MD visit with Dr. Percival Spanish with f/u plan of "Will plan on proceeding with MRI and make further determinations at that time".  The [facility] was obtained [facility] on 03/15/21 but patient has not been scheduled for f/u with Dr. Percival Spanish.  MRI results faxed to Dr. Margretta Ditty office informing them patient has not been scheduled f/u at their office and f/u at [facility] depends on Dr. Margretta Ditty plan.

## 2021-06-04 ENCOUNTER — Ambulatory Visit: Payer: Medicare HMO | Admitting: *Deleted

## 2021-06-04 DIAGNOSIS — J4540 Moderate persistent asthma, uncomplicated: Secondary | ICD-10-CM

## 2021-06-04 DIAGNOSIS — M810 Age-related osteoporosis without current pathological fracture: Secondary | ICD-10-CM | POA: Diagnosis not present

## 2021-06-04 DIAGNOSIS — F325 Major depressive disorder, single episode, in full remission: Secondary | ICD-10-CM | POA: Diagnosis not present

## 2021-06-04 DIAGNOSIS — R69 Illness, unspecified: Secondary | ICD-10-CM | POA: Diagnosis not present

## 2021-06-04 DIAGNOSIS — I10 Essential (primary) hypertension: Secondary | ICD-10-CM

## 2021-06-04 DIAGNOSIS — C44712 Basal cell carcinoma of skin of right lower limb, including hip: Secondary | ICD-10-CM

## 2021-06-04 NOTE — Chronic Care Management (AMB) (Signed)
Chronic Care Management    Clinical Social Work Note  06/04/2021 Name: Patricia Galloway MRN: [id number] DOB: March 25, 1951  Patricia Galloway is a 70 y.o. year old female who is a primary care patient of [name], MD. The CCM team was consulted to assist the patient with chronic disease management and/or care coordination needs related to: [employer] .   Engaged with patient by telephone for follow up visit in response to provider referral for social work chronic care management and care coordination services.   Consent to Services:  The patient was given information about Chronic Care Management services, agreed to services, and gave verbal consent prior to initiation of services.  Please see initial visit note for detailed documentation.   Patient agreed to services and consent obtained.   Assessment: Review of patient past medical history, allergies, medications, and health status, including review of relevant consultants reports was performed today as part of a comprehensive evaluation and provision of chronic care management and care coordination services.     SDOH (Social Determinants of Health) assessments and interventions performed:    Advanced Directives Status: Not addressed in this encounter.  CCM Care Plan  Allergies  Allergen Reactions   Penicillins Anaphylaxis    Has patient had a PCN reaction causing immediate rash, facial/tongue/throat swelling, SOB or lightheadedness with hypotension: Yes Has patient had a PCN reaction causing severe rash involving mucus membranes or skin necrosis: No Has patient had a PCN reaction that required hospitalization: No Has patient had a PCN reaction occurring within the last 10 years: No If all of the above answers are "NO", then may proceed with Cephalosporin use.    Codeine Nausea And Vomiting   Latex Itching   Sulfa Antibiotics Other (See Comments)    TOPICAL-SULFA CAUSED BLISTERS   Tape Other (See Comments)    Reaction:   Blisters and burning  Pt states that paper tape is okay.     Xopenex [Levalbuterol Hcl] Anxiety    Outpatient Encounter Medications as of 06/04/2021  Medication Sig Note   albuterol (VENTOLIN HFA) 108 (90 Base) MCG/ACT inhaler Inhale 2 puffs into the lungs every 6 (six) hours as needed for wheezing or shortness of breath. Patient requests generic Ventolin    amLODipine (NORVASC) 5 MG tablet Take 1 tablet (5 mg total) by mouth daily.    benzonatate (TESSALON) 100 MG capsule Take 1-2 capsules (100-200 mg total) by mouth 2 (two) times daily as needed.    Cholecalciferol (VITAMIN D3) 250 MCG (10000 UT) TABS Take 1 tablet by mouth 2 (two) times a week.    diazepam (VALIUM) 5 MG tablet Take 1 tablet (5 mg total) by mouth daily as needed for anxiety. 30 minutes before doctor's visit    fluticasone furoate-vilanterol (BREO ELLIPTA) 200-25 MCG/INH AEPB INHALE 1 PUFF BY MOUTH EVERY DAY    montelukast (SINGULAIR) 10 MG tablet Take 1 tablet (10 mg total) by mouth at bedtime.    Multiple Vitamin (MULTIVITAMIN) capsule Take 2 capsules by mouth daily.    OVER THE COUNTER MEDICATION Take 1 tablet by mouth 2 (two) times daily. Nutri-Calm 09/11/2018: Only with radiation   OVER THE COUNTER MEDICATION Life extension bone strength / life extension skin restore ceramide / life extension super bio curcumin    valsartan (DIOVAN) 160 MG tablet TAKE 1 TABLET BY MOUTH EVERY DAY    No facility-administered encounter medications on file as of 06/04/2021.    Patient Active Problem List   Diagnosis Date Noted  Basal cell carcinoma 03/29/2017   Thoracic aortic aneurysm without rupture (HCC) 10/31/2016   Atherosclerosis of aorta ([place]) 10/31/2016   Chronic pancreatitis ([place]) 10/31/2016   Pulmonary nodule 10/31/2016   Centrilobular emphysema ([place]) 10/31/2016   Iron deficiency anemia due to chronic blood loss 10/24/2016   Basal cell carcinoma, leg, right 08/24/2016   Hyperglycemia 08/01/2016   Chronic thoracic back pain  08/04/2015   Panic attack 08/04/2015   White coat syndrome without diagnosis of hypertension 08/04/2015   Major depression, recurrent ([place]) 08/04/2015   Asthma, moderate persistent, poorly-controlled 08/04/2015    Conditions to be addressed/monitored:  Dwaine Gale cell carcinoma right leg, depression ; Limited social support need for community resources  Care Plan : General Social Work (Adult)  Updates made by Vern Claude, LCSW since 06/04/2021 12:00 AM     Problem: Caregiver Stress      Goal: Caregiver Coping Optimized   Start Date: 03/22/2021  Expected End Date: 10/22/2021  This Visit's Progress: On track  Recent Progress: On track  Priority: High  Note:   Current Barriers:  Chronic Mental Health needs related to  upcoming surgery and possible need for in home care for herself and her spouse Financial constraints related to limited income, Limited social support, and Limited access to caregiver Suicidal Ideation/Homicidal Ideation: No  Clinical Social Work Goal(s):  Over the next 90 days, patient will work with SW bi-weekly by telephone or in person to reduce or management anxiety/stress related to arranging care for her spouse during her hospitalization  Interventions: SDOH Interventions    Flowsheet Row Most Recent Value  SDOH Interventions   SDOH Interventions for the Following Domains Stress  Stress Interventions Provide Counseling     Confirmed that patient's surgery still has no date-patient agreeable to calling for follow up Confirmed continued plan to  stay with family friend following her surgery to assist with her recovery before her return home with spouse Wheelchair ramp confirmed to be installed-patient pleased with the end result Patient confirmed that her stress level has improved-verbalized no additional community resource needs at this time Positive reinforcement provided for follow up with wheelchair ramp and plans made for care for herself and spouse  following her surgery Patient confirmed having no additional community resource needs-patient encouraged to contact this Education officer, museum with any additional community resource needs  Patient Self Care Activities:  Performs ADL's independently Performs IADL's independently  Patient Coping Strengths:  Hopefulness Self Advocate Able to Communicate Effectively  Patient Self Care Deficits:  Knowledge deficit of available resource for in home care  Please see past updates related to this goal by clicking on the "Past Updates" button in the selected goal          Follow Up Plan: Client will contact this Education officer, museum with any additional community resource needs      [place], [place] Worker  [facility] Care Management [phone]

## 2021-06-04 NOTE — Patient Instructions (Signed)
Visit Information   Goals Addressed             This Visit's Progress    Find Help in My Community       Timeframe:  Long-Range Goal Priority:  High Start Date:    03/22/21                         Expected End Date:   10/22/20                  Follow Up Date 06/04/21  - follow-up on any referrals for help I am given - think ahead to make sure my need does not become an emergency - have a back-up plan - make a list of family or friends that I can call     Why is this important?   Knowing how and where to find help for yourself or family in your neighborhood and community is an important skill.  You will want to take some steps to learn how.    Notes:         The patient verbalized understanding of instructions, educational materials, and care plan provided today and declined offer to receive copy of patient instructions, educational materials, and care plan.   No further follow up required: patient to contact this Education officer, museum with any additional community resource needs  [employer], [place] Worker  [facility] Care Management [phone]

## 2021-06-07 ENCOUNTER — Encounter: Payer: Self-pay | Admitting: Oncology

## 2021-06-11 DIAGNOSIS — R2240 Localized swelling, mass and lump, unspecified lower limb: Secondary | ICD-10-CM | POA: Insufficient documentation

## 2021-07-14 NOTE — Telephone Encounter (Signed)
Received fax from [place] that patient is scheduled for surgery with Dr. Sherlynn Stalls at [facility] on 08/02/21.

## 2021-07-16 ENCOUNTER — Telehealth: Payer: Self-pay

## 2021-07-16 NOTE — Progress Notes (Signed)
Chronic Care Management Pharmacy Assistant   Name: Patricia Galloway  MRN: [id number] DOB: 1951-07-02  Reason for Encounter: Hypertension Disease State Call   Recent office visits:  None ID  Recent consult visits:  None ID  Hospital visits:  None in previous 6 months  Medications: Outpatient Encounter Medications as of 07/16/2021  Medication Sig Note   albuterol (VENTOLIN HFA) 108 (90 Base) MCG/ACT inhaler Inhale 2 puffs into the lungs every 6 (six) hours as needed for wheezing or shortness of breath. Patient requests generic Ventolin    amLODipine (NORVASC) 5 MG tablet Take 1 tablet (5 mg total) by mouth daily.    benzonatate (TESSALON) 100 MG capsule Take 1-2 capsules (100-200 mg total) by mouth 2 (two) times daily as needed.    Cholecalciferol (VITAMIN D3) 250 MCG (10000 UT) TABS Take 1 tablet by mouth 2 (two) times a week.    diazepam (VALIUM) 5 MG tablet Take 1 tablet (5 mg total) by mouth daily as needed for anxiety. 30 minutes before doctor's visit    fluticasone furoate-vilanterol (BREO ELLIPTA) 200-25 MCG/INH AEPB INHALE 1 PUFF BY MOUTH EVERY DAY    montelukast (SINGULAIR) 10 MG tablet Take 1 tablet (10 mg total) by mouth at bedtime.    Multiple Vitamin (MULTIVITAMIN) capsule Take 2 capsules by mouth daily.    OVER THE COUNTER MEDICATION Take 1 tablet by mouth 2 (two) times daily. Nutri-Calm 09/11/2018: Only with radiation   OVER THE COUNTER MEDICATION Life extension bone strength / life extension skin restore ceramide / life extension super bio curcumin    valsartan (DIOVAN) 160 MG tablet TAKE 1 TABLET BY MOUTH EVERY DAY    No facility-administered encounter medications on file as of 07/16/2021.   Care Gaps: COVID-19 Vaccine PNA Vaccine Zoster Vaccine Colonoscopy  Star Rating Drugs: Valsartan 160 mg last filled on 05/11/2021 for a 90-Day supply with [facility]  Reviewed chart prior to disease state call. Spoke with patient regarding BP  Recent Office  Vitals: BP Readings from Last 3 Encounters:  04/06/21 136/80  02/04/21 (!) 146/82  02/03/21 (!) 198/107   Pulse Readings from Last 3 Encounters:  04/06/21 92  02/03/21 (!) 118  01/26/21 91    Wt Readings from Last 3 Encounters:  04/06/21 115 lb (52.2 kg)  02/03/21 114 lb 6.4 oz (51.9 kg)  01/26/21 118 lb (53.5 kg)     Kidney Function Lab Results  Component Value Date/Time   CREATININE 0.60 11/06/2020 11:29 AM   CREATININE 0.76 02/13/2020 11:45 AM   CREATININE 0.80 06/17/2019 11:00 AM   CREATININE 0.75 02/12/2019 10:08 AM   GFRNONAA 81 02/13/2020 11:45 AM   GFRAA 93 02/13/2020 11:45 AM    BMP Latest Ref Rng & Units 11/06/2020 02/13/2020 06/17/2019  Glucose 65 - 99 mg/dL - 94 -  BUN 7 - 25 mg/dL - 14 -  Creatinine 0.44 - 1.00 mg/dL 0.60 0.76 0.80  BUN/Creat Ratio 6 - 22 (calc) - NOT APPLICABLE -  Sodium 916 - 146 mmol/L - 137 -  Potassium 3.5 - 5.3 mmol/L - 4.1 -  Chloride 98 - 110 mmol/L - 99 -  CO2 20 - 32 mmol/L - 29 -  Calcium 8.6 - 10.4 mg/dL - 9.9 -    Current antihypertensive regimen:  Valsartan 160 mg 1 tablet daily Amlodipine 5 mg 1 tablet daily  How often are you checking your Blood Pressure?  Patient stated she hasn't been checking her blood pressure  Current home BP readings:  None ID  What recent interventions/DTPs have been made by any provider to improve Blood Pressure control since last CPP Visit: None ID  Any recent hospitalizations or ED visits since last visit with CPP? No  What diet changes have been made to improve Blood Pressure Control?  Patient is not on any type of diet. Patient eat's normally. Patient is the main caregiver for her husband and for the most part they eat healthy because the husband is limited on things he can eat  What exercise is being done to improve your Blood Pressure Control?  Due to the patient being the main caregiver to her husband she does not do much exercising  Adherence Review: Is the patient currently on  ACE/ARB medication? Yes Does the patient have >5 day gap between last estimated fill dates? No  Patient reports that she is doing well. Patient does report having some anxiety as she does have an upcoming surgery on her leg 08/02/2021 which she will have to stay in the hospital for a few days depending on the outcome of the surgery. She is concerned about leaving her spouse, and also the surgery itself. Patient does have plans in place for someone to look in on her spouse as well as take care of her after surgery. Patient stated she has been busy preparing, and freezing meals so when she goes in for surgery her spouse will have food he can warm up. Patient denies any ill symptoms at this time. She reports that she is taking all medications as directed. Patient has not concerns or issues at this time.   Patient has a scheduled telephone appointment with [name], CPP on 11/10/2021 @ [place], CPA/CMA Catering manager Phone: [phone]

## 2021-07-27 DIAGNOSIS — J4540 Moderate persistent asthma, uncomplicated: Secondary | ICD-10-CM | POA: Diagnosis not present

## 2021-07-27 DIAGNOSIS — R69 Illness, unspecified: Secondary | ICD-10-CM | POA: Diagnosis not present

## 2021-07-27 DIAGNOSIS — I7121 Aneurysm of the ascending aorta, without rupture: Secondary | ICD-10-CM | POA: Diagnosis not present

## 2021-07-27 DIAGNOSIS — E782 Mixed hyperlipidemia: Secondary | ICD-10-CM | POA: Diagnosis not present

## 2021-07-27 DIAGNOSIS — I10 Essential (primary) hypertension: Secondary | ICD-10-CM | POA: Diagnosis not present

## 2021-07-27 DIAGNOSIS — J432 Centrilobular emphysema: Secondary | ICD-10-CM | POA: Diagnosis not present

## 2021-08-02 DIAGNOSIS — I10 Essential (primary) hypertension: Secondary | ICD-10-CM | POA: Diagnosis not present

## 2021-08-02 DIAGNOSIS — R2241 Localized swelling, mass and lump, right lower limb: Secondary | ICD-10-CM | POA: Diagnosis not present

## 2021-08-02 DIAGNOSIS — R2240 Localized swelling, mass and lump, unspecified lower limb: Secondary | ICD-10-CM | POA: Diagnosis not present

## 2021-08-02 DIAGNOSIS — E782 Mixed hyperlipidemia: Secondary | ICD-10-CM | POA: Diagnosis not present

## 2021-08-02 DIAGNOSIS — I7121 Aneurysm of the ascending aorta, without rupture: Secondary | ICD-10-CM | POA: Diagnosis not present

## 2021-08-02 DIAGNOSIS — C44712 Basal cell carcinoma of skin of right lower limb, including hip: Secondary | ICD-10-CM | POA: Diagnosis not present

## 2021-08-02 DIAGNOSIS — Z85828 Personal history of other malignant neoplasm of skin: Secondary | ICD-10-CM | POA: Diagnosis not present

## 2021-08-02 DIAGNOSIS — M810 Age-related osteoporosis without current pathological fracture: Secondary | ICD-10-CM | POA: Diagnosis not present

## 2021-08-02 DIAGNOSIS — J4540 Moderate persistent asthma, uncomplicated: Secondary | ICD-10-CM | POA: Diagnosis not present

## 2021-08-02 DIAGNOSIS — Z01818 Encounter for other preprocedural examination: Secondary | ICD-10-CM | POA: Diagnosis not present

## 2021-08-02 DIAGNOSIS — R69 Illness, unspecified: Secondary | ICD-10-CM | POA: Diagnosis not present

## 2021-08-02 DIAGNOSIS — J432 Centrilobular emphysema: Secondary | ICD-10-CM | POA: Diagnosis not present

## 2021-08-02 DIAGNOSIS — Z20822 Contact with and (suspected) exposure to covid-19: Secondary | ICD-10-CM | POA: Diagnosis not present

## 2021-08-03 DIAGNOSIS — J4540 Moderate persistent asthma, uncomplicated: Secondary | ICD-10-CM | POA: Diagnosis not present

## 2021-08-03 DIAGNOSIS — I10 Essential (primary) hypertension: Secondary | ICD-10-CM | POA: Diagnosis not present

## 2021-08-03 DIAGNOSIS — J432 Centrilobular emphysema: Secondary | ICD-10-CM | POA: Diagnosis not present

## 2021-08-03 DIAGNOSIS — R69 Illness, unspecified: Secondary | ICD-10-CM | POA: Diagnosis not present

## 2021-08-03 DIAGNOSIS — R768 Other specified abnormal immunological findings in serum: Secondary | ICD-10-CM | POA: Insufficient documentation

## 2021-08-03 DIAGNOSIS — I7121 Aneurysm of the ascending aorta, without rupture: Secondary | ICD-10-CM | POA: Diagnosis not present

## 2021-08-03 DIAGNOSIS — E782 Mixed hyperlipidemia: Secondary | ICD-10-CM | POA: Diagnosis not present

## 2021-08-03 DIAGNOSIS — M810 Age-related osteoporosis without current pathological fracture: Secondary | ICD-10-CM | POA: Diagnosis not present

## 2021-08-03 DIAGNOSIS — R2240 Localized swelling, mass and lump, unspecified lower limb: Secondary | ICD-10-CM | POA: Diagnosis not present

## 2021-08-03 DIAGNOSIS — Z20822 Contact with and (suspected) exposure to covid-19: Secondary | ICD-10-CM | POA: Diagnosis not present

## 2021-08-03 DIAGNOSIS — C44712 Basal cell carcinoma of skin of right lower limb, including hip: Secondary | ICD-10-CM | POA: Diagnosis not present

## 2021-08-04 DIAGNOSIS — E782 Mixed hyperlipidemia: Secondary | ICD-10-CM | POA: Diagnosis not present

## 2021-08-04 DIAGNOSIS — T8189XA Other complications of procedures, not elsewhere classified, initial encounter: Secondary | ICD-10-CM | POA: Diagnosis not present

## 2021-08-04 DIAGNOSIS — R69 Illness, unspecified: Secondary | ICD-10-CM | POA: Diagnosis not present

## 2021-08-04 DIAGNOSIS — J432 Centrilobular emphysema: Secondary | ICD-10-CM | POA: Diagnosis not present

## 2021-08-04 DIAGNOSIS — J4540 Moderate persistent asthma, uncomplicated: Secondary | ICD-10-CM | POA: Diagnosis not present

## 2021-08-04 DIAGNOSIS — C44712 Basal cell carcinoma of skin of right lower limb, including hip: Secondary | ICD-10-CM | POA: Diagnosis not present

## 2021-08-04 DIAGNOSIS — M810 Age-related osteoporosis without current pathological fracture: Secondary | ICD-10-CM | POA: Diagnosis not present

## 2021-08-04 DIAGNOSIS — Z20822 Contact with and (suspected) exposure to covid-19: Secondary | ICD-10-CM | POA: Diagnosis not present

## 2021-08-04 DIAGNOSIS — I7121 Aneurysm of the ascending aorta, without rupture: Secondary | ICD-10-CM | POA: Diagnosis not present

## 2021-08-04 DIAGNOSIS — I10 Essential (primary) hypertension: Secondary | ICD-10-CM | POA: Diagnosis not present

## 2021-08-05 DIAGNOSIS — I10 Essential (primary) hypertension: Secondary | ICD-10-CM | POA: Diagnosis not present

## 2021-08-05 DIAGNOSIS — M810 Age-related osteoporosis without current pathological fracture: Secondary | ICD-10-CM | POA: Diagnosis not present

## 2021-08-05 DIAGNOSIS — C44712 Basal cell carcinoma of skin of right lower limb, including hip: Secondary | ICD-10-CM | POA: Diagnosis not present

## 2021-08-05 DIAGNOSIS — E782 Mixed hyperlipidemia: Secondary | ICD-10-CM | POA: Diagnosis not present

## 2021-08-05 DIAGNOSIS — J4540 Moderate persistent asthma, uncomplicated: Secondary | ICD-10-CM | POA: Diagnosis not present

## 2021-08-05 DIAGNOSIS — Z20822 Contact with and (suspected) exposure to covid-19: Secondary | ICD-10-CM | POA: Diagnosis not present

## 2021-08-05 DIAGNOSIS — I7121 Aneurysm of the ascending aorta, without rupture: Secondary | ICD-10-CM | POA: Diagnosis not present

## 2021-08-05 DIAGNOSIS — J432 Centrilobular emphysema: Secondary | ICD-10-CM | POA: Diagnosis not present

## 2021-08-05 DIAGNOSIS — R69 Illness, unspecified: Secondary | ICD-10-CM | POA: Diagnosis not present

## 2021-08-06 DIAGNOSIS — E782 Mixed hyperlipidemia: Secondary | ICD-10-CM | POA: Diagnosis not present

## 2021-08-06 DIAGNOSIS — M810 Age-related osteoporosis without current pathological fracture: Secondary | ICD-10-CM | POA: Diagnosis not present

## 2021-08-06 DIAGNOSIS — J4540 Moderate persistent asthma, uncomplicated: Secondary | ICD-10-CM | POA: Diagnosis not present

## 2021-08-06 DIAGNOSIS — I10 Essential (primary) hypertension: Secondary | ICD-10-CM | POA: Diagnosis not present

## 2021-08-06 DIAGNOSIS — C44712 Basal cell carcinoma of skin of right lower limb, including hip: Secondary | ICD-10-CM | POA: Diagnosis not present

## 2021-08-06 DIAGNOSIS — R69 Illness, unspecified: Secondary | ICD-10-CM | POA: Diagnosis not present

## 2021-08-06 DIAGNOSIS — Z20822 Contact with and (suspected) exposure to covid-19: Secondary | ICD-10-CM | POA: Diagnosis not present

## 2021-08-06 DIAGNOSIS — J432 Centrilobular emphysema: Secondary | ICD-10-CM | POA: Diagnosis not present

## 2021-08-06 DIAGNOSIS — I7121 Aneurysm of the ascending aorta, without rupture: Secondary | ICD-10-CM | POA: Diagnosis not present

## 2021-08-10 DIAGNOSIS — E785 Hyperlipidemia, unspecified: Secondary | ICD-10-CM | POA: Diagnosis not present

## 2021-08-10 DIAGNOSIS — R69 Illness, unspecified: Secondary | ICD-10-CM | POA: Diagnosis not present

## 2021-08-10 DIAGNOSIS — M810 Age-related osteoporosis without current pathological fracture: Secondary | ICD-10-CM | POA: Diagnosis not present

## 2021-08-10 DIAGNOSIS — Z4801 Encounter for change or removal of surgical wound dressing: Secondary | ICD-10-CM | POA: Diagnosis not present

## 2021-08-10 DIAGNOSIS — R2240 Localized swelling, mass and lump, unspecified lower limb: Secondary | ICD-10-CM | POA: Diagnosis not present

## 2021-08-10 DIAGNOSIS — J432 Centrilobular emphysema: Secondary | ICD-10-CM | POA: Diagnosis not present

## 2021-08-10 DIAGNOSIS — J439 Emphysema, unspecified: Secondary | ICD-10-CM | POA: Diagnosis not present

## 2021-08-10 DIAGNOSIS — Z9181 History of falling: Secondary | ICD-10-CM | POA: Diagnosis not present

## 2021-08-10 DIAGNOSIS — I10 Essential (primary) hypertension: Secondary | ICD-10-CM | POA: Diagnosis not present

## 2021-08-10 DIAGNOSIS — Z85828 Personal history of other malignant neoplasm of skin: Secondary | ICD-10-CM | POA: Diagnosis not present

## 2021-08-10 DIAGNOSIS — R768 Other specified abnormal immunological findings in serum: Secondary | ICD-10-CM | POA: Diagnosis not present

## 2021-08-10 DIAGNOSIS — Z87891 Personal history of nicotine dependence: Secondary | ICD-10-CM | POA: Diagnosis not present

## 2021-08-10 DIAGNOSIS — Z7951 Long term (current) use of inhaled steroids: Secondary | ICD-10-CM | POA: Diagnosis not present

## 2021-08-10 DIAGNOSIS — I700 Atherosclerosis of aorta: Secondary | ICD-10-CM | POA: Diagnosis not present

## 2021-08-10 DIAGNOSIS — F419 Anxiety disorder, unspecified: Secondary | ICD-10-CM | POA: Diagnosis not present

## 2021-08-10 DIAGNOSIS — Z483 Aftercare following surgery for neoplasm: Secondary | ICD-10-CM | POA: Diagnosis not present

## 2021-08-10 DIAGNOSIS — J45909 Unspecified asthma, uncomplicated: Secondary | ICD-10-CM | POA: Diagnosis not present

## 2021-08-10 DIAGNOSIS — C44712 Basal cell carcinoma of skin of right lower limb, including hip: Secondary | ICD-10-CM | POA: Diagnosis not present

## 2021-08-10 DIAGNOSIS — I712 Thoracic aortic aneurysm, without rupture, unspecified: Secondary | ICD-10-CM | POA: Diagnosis not present

## 2021-08-11 ENCOUNTER — Telehealth: Payer: Self-pay | Admitting: Family Medicine

## 2021-08-11 NOTE — Telephone Encounter (Signed)
Spoke with [name] and let him know I alerted [employer] of patient's status so hopefully they will cease communication until further notified. He was pleasant and verbalized thankfulness and appreciation.

## 2021-08-11 NOTE — Telephone Encounter (Signed)
Husband of patient Patricia Galloway called in states received letter for patient to have colectoral screening test but she is unable to do s. She just had surgery and is in nursing home He says the number he was given in letter to call, [phone], doesn't work

## 2021-08-12 DIAGNOSIS — C44712 Basal cell carcinoma of skin of right lower limb, including hip: Secondary | ICD-10-CM | POA: Diagnosis not present

## 2021-08-12 DIAGNOSIS — J439 Emphysema, unspecified: Secondary | ICD-10-CM | POA: Diagnosis not present

## 2021-08-12 DIAGNOSIS — I700 Atherosclerosis of aorta: Secondary | ICD-10-CM | POA: Diagnosis not present

## 2021-08-12 DIAGNOSIS — T8189XA Other complications of procedures, not elsewhere classified, initial encounter: Secondary | ICD-10-CM | POA: Diagnosis not present

## 2021-08-12 DIAGNOSIS — I712 Thoracic aortic aneurysm, without rupture, unspecified: Secondary | ICD-10-CM | POA: Diagnosis not present

## 2021-08-12 DIAGNOSIS — J45909 Unspecified asthma, uncomplicated: Secondary | ICD-10-CM | POA: Diagnosis not present

## 2021-08-12 DIAGNOSIS — Z87891 Personal history of nicotine dependence: Secondary | ICD-10-CM | POA: Diagnosis not present

## 2021-08-12 DIAGNOSIS — R69 Illness, unspecified: Secondary | ICD-10-CM | POA: Diagnosis not present

## 2021-08-12 DIAGNOSIS — F419 Anxiety disorder, unspecified: Secondary | ICD-10-CM | POA: Diagnosis not present

## 2021-08-12 DIAGNOSIS — Z483 Aftercare following surgery for neoplasm: Secondary | ICD-10-CM | POA: Diagnosis not present

## 2021-08-12 DIAGNOSIS — M810 Age-related osteoporosis without current pathological fracture: Secondary | ICD-10-CM | POA: Diagnosis not present

## 2021-08-12 DIAGNOSIS — I10 Essential (primary) hypertension: Secondary | ICD-10-CM | POA: Diagnosis not present

## 2021-08-12 DIAGNOSIS — Z85828 Personal history of other malignant neoplasm of skin: Secondary | ICD-10-CM | POA: Diagnosis not present

## 2021-08-12 DIAGNOSIS — E785 Hyperlipidemia, unspecified: Secondary | ICD-10-CM | POA: Diagnosis not present

## 2021-08-12 DIAGNOSIS — Z7951 Long term (current) use of inhaled steroids: Secondary | ICD-10-CM | POA: Diagnosis not present

## 2021-08-12 DIAGNOSIS — R768 Other specified abnormal immunological findings in serum: Secondary | ICD-10-CM | POA: Diagnosis not present

## 2021-08-13 DIAGNOSIS — Z483 Aftercare following surgery for neoplasm: Secondary | ICD-10-CM | POA: Diagnosis not present

## 2021-08-13 DIAGNOSIS — I10 Essential (primary) hypertension: Secondary | ICD-10-CM | POA: Diagnosis not present

## 2021-08-13 DIAGNOSIS — T8189XA Other complications of procedures, not elsewhere classified, initial encounter: Secondary | ICD-10-CM | POA: Diagnosis not present

## 2021-08-13 DIAGNOSIS — I700 Atherosclerosis of aorta: Secondary | ICD-10-CM | POA: Diagnosis not present

## 2021-08-13 DIAGNOSIS — R768 Other specified abnormal immunological findings in serum: Secondary | ICD-10-CM | POA: Diagnosis not present

## 2021-08-13 DIAGNOSIS — J439 Emphysema, unspecified: Secondary | ICD-10-CM | POA: Diagnosis not present

## 2021-08-13 DIAGNOSIS — Z85828 Personal history of other malignant neoplasm of skin: Secondary | ICD-10-CM | POA: Diagnosis not present

## 2021-08-13 DIAGNOSIS — R69 Illness, unspecified: Secondary | ICD-10-CM | POA: Diagnosis not present

## 2021-08-13 DIAGNOSIS — Z87891 Personal history of nicotine dependence: Secondary | ICD-10-CM | POA: Diagnosis not present

## 2021-08-13 DIAGNOSIS — Z7951 Long term (current) use of inhaled steroids: Secondary | ICD-10-CM | POA: Diagnosis not present

## 2021-08-13 DIAGNOSIS — F419 Anxiety disorder, unspecified: Secondary | ICD-10-CM | POA: Diagnosis not present

## 2021-08-13 DIAGNOSIS — J45909 Unspecified asthma, uncomplicated: Secondary | ICD-10-CM | POA: Diagnosis not present

## 2021-08-13 DIAGNOSIS — I712 Thoracic aortic aneurysm, without rupture, unspecified: Secondary | ICD-10-CM | POA: Diagnosis not present

## 2021-08-13 DIAGNOSIS — E785 Hyperlipidemia, unspecified: Secondary | ICD-10-CM | POA: Diagnosis not present

## 2021-08-13 DIAGNOSIS — M810 Age-related osteoporosis without current pathological fracture: Secondary | ICD-10-CM | POA: Diagnosis not present

## 2021-08-13 DIAGNOSIS — C44712 Basal cell carcinoma of skin of right lower limb, including hip: Secondary | ICD-10-CM | POA: Diagnosis not present

## 2021-08-20 DIAGNOSIS — E785 Hyperlipidemia, unspecified: Secondary | ICD-10-CM | POA: Diagnosis not present

## 2021-08-20 DIAGNOSIS — Z483 Aftercare following surgery for neoplasm: Secondary | ICD-10-CM | POA: Diagnosis not present

## 2021-08-20 DIAGNOSIS — M810 Age-related osteoporosis without current pathological fracture: Secondary | ICD-10-CM | POA: Diagnosis not present

## 2021-08-20 DIAGNOSIS — I712 Thoracic aortic aneurysm, without rupture, unspecified: Secondary | ICD-10-CM | POA: Diagnosis not present

## 2021-08-20 DIAGNOSIS — C44712 Basal cell carcinoma of skin of right lower limb, including hip: Secondary | ICD-10-CM | POA: Diagnosis not present

## 2021-08-20 DIAGNOSIS — I10 Essential (primary) hypertension: Secondary | ICD-10-CM | POA: Diagnosis not present

## 2021-08-20 DIAGNOSIS — I700 Atherosclerosis of aorta: Secondary | ICD-10-CM | POA: Diagnosis not present

## 2021-08-20 DIAGNOSIS — J439 Emphysema, unspecified: Secondary | ICD-10-CM | POA: Diagnosis not present

## 2021-08-20 DIAGNOSIS — J45909 Unspecified asthma, uncomplicated: Secondary | ICD-10-CM | POA: Diagnosis not present

## 2021-08-20 DIAGNOSIS — Z7951 Long term (current) use of inhaled steroids: Secondary | ICD-10-CM | POA: Diagnosis not present

## 2021-08-20 DIAGNOSIS — R69 Illness, unspecified: Secondary | ICD-10-CM | POA: Diagnosis not present

## 2021-08-20 DIAGNOSIS — R768 Other specified abnormal immunological findings in serum: Secondary | ICD-10-CM | POA: Diagnosis not present

## 2021-08-20 DIAGNOSIS — Z85828 Personal history of other malignant neoplasm of skin: Secondary | ICD-10-CM | POA: Diagnosis not present

## 2021-08-20 DIAGNOSIS — F419 Anxiety disorder, unspecified: Secondary | ICD-10-CM | POA: Diagnosis not present

## 2021-08-20 DIAGNOSIS — Z87891 Personal history of nicotine dependence: Secondary | ICD-10-CM | POA: Diagnosis not present

## 2021-08-23 DIAGNOSIS — R2240 Localized swelling, mass and lump, unspecified lower limb: Secondary | ICD-10-CM | POA: Diagnosis not present

## 2021-08-23 DIAGNOSIS — X58XXXA Exposure to other specified factors, initial encounter: Secondary | ICD-10-CM | POA: Diagnosis not present

## 2021-08-23 DIAGNOSIS — S71101A Unspecified open wound, right thigh, initial encounter: Secondary | ICD-10-CM | POA: Diagnosis not present

## 2021-08-23 DIAGNOSIS — S81801A Unspecified open wound, right lower leg, initial encounter: Secondary | ICD-10-CM | POA: Diagnosis not present

## 2021-08-23 DIAGNOSIS — L905 Scar conditions and fibrosis of skin: Secondary | ICD-10-CM | POA: Diagnosis not present

## 2021-08-23 DIAGNOSIS — Z79899 Other long term (current) drug therapy: Secondary | ICD-10-CM | POA: Diagnosis not present

## 2021-08-23 DIAGNOSIS — C44712 Basal cell carcinoma of skin of right lower limb, including hip: Secondary | ICD-10-CM | POA: Diagnosis not present

## 2021-08-23 DIAGNOSIS — Z20822 Contact with and (suspected) exposure to covid-19: Secondary | ICD-10-CM | POA: Diagnosis not present

## 2021-08-23 DIAGNOSIS — R2241 Localized swelling, mass and lump, right lower limb: Secondary | ICD-10-CM | POA: Diagnosis not present

## 2021-08-23 DIAGNOSIS — R2689 Other abnormalities of gait and mobility: Secondary | ICD-10-CM | POA: Diagnosis not present

## 2021-08-24 ENCOUNTER — Ambulatory Visit: Payer: Medicare HMO

## 2021-08-24 DIAGNOSIS — X58XXXA Exposure to other specified factors, initial encounter: Secondary | ICD-10-CM | POA: Diagnosis not present

## 2021-08-24 DIAGNOSIS — C44712 Basal cell carcinoma of skin of right lower limb, including hip: Secondary | ICD-10-CM | POA: Diagnosis not present

## 2021-08-24 DIAGNOSIS — L905 Scar conditions and fibrosis of skin: Secondary | ICD-10-CM | POA: Diagnosis not present

## 2021-08-24 DIAGNOSIS — R2689 Other abnormalities of gait and mobility: Secondary | ICD-10-CM | POA: Diagnosis not present

## 2021-08-24 DIAGNOSIS — Z20822 Contact with and (suspected) exposure to covid-19: Secondary | ICD-10-CM | POA: Diagnosis not present

## 2021-08-24 DIAGNOSIS — Z79899 Other long term (current) drug therapy: Secondary | ICD-10-CM | POA: Diagnosis not present

## 2021-08-24 DIAGNOSIS — S71101A Unspecified open wound, right thigh, initial encounter: Secondary | ICD-10-CM | POA: Diagnosis not present

## 2021-08-27 DIAGNOSIS — Z483 Aftercare following surgery for neoplasm: Secondary | ICD-10-CM | POA: Diagnosis not present

## 2021-08-27 DIAGNOSIS — E785 Hyperlipidemia, unspecified: Secondary | ICD-10-CM | POA: Diagnosis not present

## 2021-08-27 DIAGNOSIS — Z7951 Long term (current) use of inhaled steroids: Secondary | ICD-10-CM | POA: Diagnosis not present

## 2021-08-27 DIAGNOSIS — M810 Age-related osteoporosis without current pathological fracture: Secondary | ICD-10-CM | POA: Diagnosis not present

## 2021-08-27 DIAGNOSIS — I712 Thoracic aortic aneurysm, without rupture, unspecified: Secondary | ICD-10-CM | POA: Diagnosis not present

## 2021-08-27 DIAGNOSIS — Z87891 Personal history of nicotine dependence: Secondary | ICD-10-CM | POA: Diagnosis not present

## 2021-08-27 DIAGNOSIS — C44712 Basal cell carcinoma of skin of right lower limb, including hip: Secondary | ICD-10-CM | POA: Diagnosis not present

## 2021-08-27 DIAGNOSIS — J45909 Unspecified asthma, uncomplicated: Secondary | ICD-10-CM | POA: Diagnosis not present

## 2021-08-27 DIAGNOSIS — R69 Illness, unspecified: Secondary | ICD-10-CM | POA: Diagnosis not present

## 2021-08-27 DIAGNOSIS — Z85828 Personal history of other malignant neoplasm of skin: Secondary | ICD-10-CM | POA: Diagnosis not present

## 2021-08-27 DIAGNOSIS — R768 Other specified abnormal immunological findings in serum: Secondary | ICD-10-CM | POA: Diagnosis not present

## 2021-08-27 DIAGNOSIS — J439 Emphysema, unspecified: Secondary | ICD-10-CM | POA: Diagnosis not present

## 2021-08-27 DIAGNOSIS — I10 Essential (primary) hypertension: Secondary | ICD-10-CM | POA: Diagnosis not present

## 2021-08-27 DIAGNOSIS — I700 Atherosclerosis of aorta: Secondary | ICD-10-CM | POA: Diagnosis not present

## 2021-08-27 DIAGNOSIS — F419 Anxiety disorder, unspecified: Secondary | ICD-10-CM | POA: Diagnosis not present

## 2021-08-30 DIAGNOSIS — R69 Illness, unspecified: Secondary | ICD-10-CM | POA: Diagnosis not present

## 2021-08-30 DIAGNOSIS — Z85828 Personal history of other malignant neoplasm of skin: Secondary | ICD-10-CM | POA: Diagnosis not present

## 2021-08-30 DIAGNOSIS — R768 Other specified abnormal immunological findings in serum: Secondary | ICD-10-CM | POA: Diagnosis not present

## 2021-08-30 DIAGNOSIS — I712 Thoracic aortic aneurysm, without rupture, unspecified: Secondary | ICD-10-CM | POA: Diagnosis not present

## 2021-08-30 DIAGNOSIS — I10 Essential (primary) hypertension: Secondary | ICD-10-CM | POA: Diagnosis not present

## 2021-08-30 DIAGNOSIS — J45909 Unspecified asthma, uncomplicated: Secondary | ICD-10-CM | POA: Diagnosis not present

## 2021-08-30 DIAGNOSIS — E785 Hyperlipidemia, unspecified: Secondary | ICD-10-CM | POA: Diagnosis not present

## 2021-08-30 DIAGNOSIS — Z87891 Personal history of nicotine dependence: Secondary | ICD-10-CM | POA: Diagnosis not present

## 2021-08-30 DIAGNOSIS — M810 Age-related osteoporosis without current pathological fracture: Secondary | ICD-10-CM | POA: Diagnosis not present

## 2021-08-30 DIAGNOSIS — I700 Atherosclerosis of aorta: Secondary | ICD-10-CM | POA: Diagnosis not present

## 2021-08-30 DIAGNOSIS — J439 Emphysema, unspecified: Secondary | ICD-10-CM | POA: Diagnosis not present

## 2021-08-30 DIAGNOSIS — C44712 Basal cell carcinoma of skin of right lower limb, including hip: Secondary | ICD-10-CM | POA: Diagnosis not present

## 2021-08-30 DIAGNOSIS — Z7951 Long term (current) use of inhaled steroids: Secondary | ICD-10-CM | POA: Diagnosis not present

## 2021-08-30 DIAGNOSIS — F419 Anxiety disorder, unspecified: Secondary | ICD-10-CM | POA: Diagnosis not present

## 2021-08-30 DIAGNOSIS — Z483 Aftercare following surgery for neoplasm: Secondary | ICD-10-CM | POA: Diagnosis not present

## 2021-08-30 NOTE — Progress Notes (Signed)
[facility]  [phone] [phone]) [phone]  ID: Patricia Galloway OB: 10-02-1950  MR#: [id number]  WCB#:[id number]  Patient Care Team: [name], MD as PCP - General (Family Medicine) Lloyd Huger, MD as Consulting Physician (Oncology) Dillingham, Loel Lofty, DO as Attending Physician (Plastic Surgery) Germaine Pomfret, [facility] as Pharmacist (Pharmacist) Vern Claude, [place] as Social Worker  I connected with Patricia Galloway on 09/02/21 at  3:30 PM EST by video enabled telemedicine visit and verified that I am speaking with the correct person using two identifiers.   I discussed the limitations, risks, security and privacy concerns of performing an evaluation and management service by telemedicine and the availability of in-person appointments. I also discussed with the patient that there may be a patient responsible charge related to this service. The patient expressed understanding and agreed to proceed.   Other persons participating in the visit and their role in the encounter: Patient, MD.  Patients location: Home. Providers location: Clinic.  CHIEF COMPLAINT: Recurrent basal cell carcinoma of the right leg.  INTERVAL HISTORY: Patient agreed to video assisted telemedicine visit for further evaluation and discuss her final pathology results.  She underwent resection at [facility] in November 2022 and had to have a repeat excision on August 23, 2021 secondary to positive margins.  Final pathology revealed negative margins.  She continues to have pain, but states she is recovering slowly.  She has no neurologic complaints. She denies any recent fevers or illnesses.  She has a good appetite and denies weight loss.  She denies any chest pain, shortness of breath, cough, or hemoptysis.  She denies any nausea, vomiting, constipation, or diarrhea.  She has no melena or hematochezia.  She has no urinary complaints.  Patient offers no further specific  complaints today.  REVIEW OF SYSTEMS:   Review of Systems  Constitutional:  Negative for fever, malaise/fatigue and weight loss.  Respiratory: Negative.  Negative for cough and shortness of breath.   Cardiovascular: Negative.  Negative for chest pain and leg swelling.  Gastrointestinal: Negative.  Negative for abdominal pain, nausea and vomiting.  Genitourinary: Negative.  Negative for dysuria.  Musculoskeletal: Negative.  Negative for myalgias.  Skin: Negative.  Negative for rash.  Neurological: Negative.  Negative for sensory change, focal weakness and weakness.  Psychiatric/Behavioral: Negative.  The patient is not nervous/anxious.    As per HPI. Otherwise, a complete review of systems is negative.  PAST MEDICAL HISTORY: Past Medical History:  Diagnosis Date   Anemia    has had iron infusion   Anxiety    complicated grief   Aortic aneurysm ([place])    found on CT in December, 2017   Asthma    as child , but no episodes in over 30 years   Basal cell carcinoma    Cancer ([place]) 08/2016   basal cell carcinoma - right leg   History of transfusion of packed red blood cells    Personal history of chemotherapy 2018   basal cell     PAST SURGICAL HISTORY: Past Surgical History:  Procedure Laterality Date   BASAL CELL CARCINOMA EXCISION Right 03/29/2017   Procedure: EXCISION OF RIGHT LEG BASEL CELL CARCINOMA;  Surgeon: [name], DO;  Location: [facility];  Service: Plastics;  Laterality: Right;   COLON SURGERY  2002   colostomy for 10 months after a perfurated sigmoid    COLOSTOMY REVERSAL  2003   EMBOLIZATION Right 10/12/2016   Procedure: Embolization;  Surgeon: Hortencia Pilar  G, MD;  Location: [facility];  Service: Cardiovascular;  Laterality: Right;   etopic  4270,6237   INCISION AND DRAINAGE OF WOUND Right 03/30/2017   Procedure: IRRIGATION AND DEBRIDEMENT WOUND; VAC PLACEMENT;  Surgeon: [name], DO;  Location: [facility];  Service: Plastics;   Laterality: Right;   TONSILLECTOMY      FAMILY HISTORY: Family History  Problem Relation Age of Onset   Dementia Mother    Aortic stenosis Mother    Osteoporosis Mother    Dementia Father    Diabetes Father    Hyperlipidemia Father    Hypertension Father    Aneurysm Sister    Hyperlipidemia Sister    Hypertension Sister    Diabetes Sister    Breast cancer Neg Hx     ADVANCED DIRECTIVES (Y/N):  N  HEALTH MAINTENANCE: Social History   Tobacco Use   Smoking status: Former    Packs/day: 0.50    Years: 25.00    Pack years: 12.50    Types: Cigarettes    Start date: 09/05/1972    Quit date: 08/03/1998    Years since quitting: 23.0   Smokeless tobacco: Never   Tobacco comments:    smoking cessation materials not required  Vaping Use   Vaping Use: Never used  Substance Use Topics   Alcohol use: No    Alcohol/week: 0.0 standard drinks   Drug use: No     Colonoscopy:  PAP:  Bone density:  Lipid panel:  Allergies  Allergen Reactions   Penicillins Anaphylaxis    Has patient had a PCN reaction causing immediate rash, facial/tongue/throat swelling, SOB or lightheadedness with hypotension: Yes Has patient had a PCN reaction causing severe rash involving mucus membranes or skin necrosis: No Has patient had a PCN reaction that required hospitalization: No Has patient had a PCN reaction occurring within the last 10 years: No If all of the above answers are "NO", then may proceed with Cephalosporin use.    Codeine Nausea And Vomiting   Latex Itching   Sulfa Antibiotics Other (See Comments)    TOPICAL-SULFA CAUSED BLISTERS   Tape Other (See Comments)    Reaction:  Blisters and burning  Pt states that paper tape is okay.     Xopenex [Levalbuterol Hcl] Anxiety    Current Outpatient Medications  Medication Sig Dispense Refill   albuterol (VENTOLIN HFA) 108 (90 Base) MCG/ACT inhaler Inhale 2 puffs into the lungs every 6 (six) hours as needed for wheezing or shortness of  breath. Patient requests generic Ventolin 18 g 0   amLODipine (NORVASC) 5 MG tablet Take 1 tablet (5 mg total) by mouth daily. 90 tablet 1   benzonatate (TESSALON) 100 MG capsule Take 1-2 capsules (100-200 mg total) by mouth 2 (two) times daily as needed. 40 capsule 0   Cholecalciferol (VITAMIN D3) 250 MCG (10000 UT) TABS Take 1 tablet by mouth 2 (two) times a week.     diazepam (VALIUM) 5 MG tablet Take 1 tablet (5 mg total) by mouth daily as needed for anxiety. 30 minutes before doctor's visit 30 tablet 0   fluticasone furoate-vilanterol (BREO ELLIPTA) 200-25 MCG/INH AEPB INHALE 1 PUFF BY MOUTH EVERY DAY 60 each 2   gabapentin (NEURONTIN) 100 MG capsule Take by mouth.     montelukast (SINGULAIR) 10 MG tablet Take 1 tablet (10 mg total) by mouth at bedtime. 90 tablet 1   Multiple Vitamin (MULTIVITAMIN) capsule Take 2 capsules by mouth daily.  OVER THE COUNTER MEDICATION Take 1 tablet by mouth 2 (two) times daily. Nutri-Calm     OVER THE COUNTER MEDICATION Life extension bone strength / life extension skin restore ceramide / life extension super bio curcumin     oxyCODONE-acetaminophen (PERCOCET/ROXICET) 5-325 MG tablet Take by mouth.     valsartan (DIOVAN) 160 MG tablet TAKE 1 TABLET BY MOUTH EVERY DAY 90 tablet 1   No current facility-administered medications for this visit.    OBJECTIVE: There were no vitals filed for this visit.    There is no height or weight on file to calculate BMI.    ECOG FS:0 - Asymptomatic   General: Well-developed, well-nourished, no acute distress. HEENT: Normocephalic. Neuro: Alert, answering all questions appropriately. Cranial nerves grossly intact. Psych: Normal affect.  LAB RESULTS:  Lab Results  Component Value Date   NA 137 02/13/2020   K 4.1 02/13/2020   CL 99 02/13/2020   CO2 29 02/13/2020   GLUCOSE 94 02/13/2020   BUN 14 02/13/2020   CREATININE 0.60 11/06/2020   CALCIUM 9.9 02/13/2020   PROT 7.2 02/13/2020   ALBUMIN 4.4 02/11/2019    AST 19 02/13/2020   ALT 14 02/13/2020   ALKPHOS 62 02/11/2019   BILITOT 0.7 02/13/2020   GFRNONAA 81 02/13/2020   GFRAA 93 02/13/2020    Lab Results  Component Value Date   WBC 8.2 02/13/2020   NEUTROABS 4,600 02/13/2020   HGB 14.8 02/13/2020   HCT 44.1 02/13/2020   MCV 90.0 02/13/2020   PLT 268 02/13/2020   Lab Results  Component Value Date   IRON 71 01/09/2017   TIBC 225 (L) 01/09/2017   IRONPCTSAT 32 (H) 01/09/2017   Lab Results  Component Value Date   FERRITIN 58 01/09/2017    STUDIES: No results found.  ONCOLOGY HISTORY:  Patient stated this lesion grew for 9 years and she did not seek medical attention. Diagnosis confirmed by biopsy from surgery.  Patient underwent embolization as well as to neoadjuvant treatment with vismodegib (Erivedge)150 mg daily, a hedgehog inhibitor. Patient underwent complete resection of residual malignancy on March 29, 2017.  Previously, adjuvant chemotherapy and XRT was discussed, but given widely clear margins on surgical resections this was determined not to be necessary.  She underwent reexcision for recurrent [facility] at [facility] on August 02, 2021.  Patient noted to have positive margins and went back to surgery on August 23, 2021 to achieve negative margins.  ASSESSMENT: Recurrent basal cell carcinoma of the right leg.  PLAN:    1. Recurrent basal cell carcinoma of the right leg: PET scan results from December 18, 2020 reviewed independently with hypermetabolic bandlike region with SUV of 10.5.  Biopsy confirmed recurrent malignancy.  Case discussed with patient's primary surgeon, Dr. Audelia Hives and was determined that patient should be evaluated by orthopedic oncologist, Dr. Junie Spencer.  She finally underwent surgery in November and December as above.  Patient now has negative margins with no evidence of disease.  Continue follow-up with surgery and home health as scheduled.  Patient had video-assisted telemedicine visit in 6 weeks for  further evaluation.  Can consider repeat PET scan in several months after patient is fully recovered from surgery. 2. Iron deficiency anemia: Resolved.  Patient last received Feraheme on Jan 28, 2017.     3. Hypertension: Chronic and unchanged.   4.  Pulmonary nodules: Likely benign.  CT scan results from November 06, 2020 reviewed independently and report as above with stable nodules.  These  are not hypermetabolic based on her most recent PET scan as above.  No further imaging is necessary.  I provided 30 minutes of face-to-face video visit time during this encounter which included chart review, counseling, and coordination of care as documented above.   Patient expressed understanding and was in agreement with this plan. She also understands that She can call clinic at any time with any questions, concerns, or complaints.    Cancer Staging  Basal cell carcinoma, leg, right Staging form: Melanoma of the Skin, AJCC 7th Edition - Clinical: No stage assigned - Unsigned   Lloyd Huger, MD   09/02/2021 5:58 PM

## 2021-09-01 ENCOUNTER — Encounter: Payer: Self-pay | Admitting: Oncology

## 2021-09-01 DIAGNOSIS — I700 Atherosclerosis of aorta: Secondary | ICD-10-CM | POA: Diagnosis not present

## 2021-09-01 DIAGNOSIS — J439 Emphysema, unspecified: Secondary | ICD-10-CM | POA: Diagnosis not present

## 2021-09-01 DIAGNOSIS — R69 Illness, unspecified: Secondary | ICD-10-CM | POA: Diagnosis not present

## 2021-09-01 DIAGNOSIS — Z483 Aftercare following surgery for neoplasm: Secondary | ICD-10-CM | POA: Diagnosis not present

## 2021-09-01 DIAGNOSIS — C44712 Basal cell carcinoma of skin of right lower limb, including hip: Secondary | ICD-10-CM | POA: Diagnosis not present

## 2021-09-01 DIAGNOSIS — I10 Essential (primary) hypertension: Secondary | ICD-10-CM | POA: Diagnosis not present

## 2021-09-01 DIAGNOSIS — I712 Thoracic aortic aneurysm, without rupture, unspecified: Secondary | ICD-10-CM | POA: Diagnosis not present

## 2021-09-01 DIAGNOSIS — R768 Other specified abnormal immunological findings in serum: Secondary | ICD-10-CM | POA: Diagnosis not present

## 2021-09-01 DIAGNOSIS — J45909 Unspecified asthma, uncomplicated: Secondary | ICD-10-CM | POA: Diagnosis not present

## 2021-09-01 DIAGNOSIS — F419 Anxiety disorder, unspecified: Secondary | ICD-10-CM | POA: Diagnosis not present

## 2021-09-01 DIAGNOSIS — Z87891 Personal history of nicotine dependence: Secondary | ICD-10-CM | POA: Diagnosis not present

## 2021-09-01 DIAGNOSIS — Z85828 Personal history of other malignant neoplasm of skin: Secondary | ICD-10-CM | POA: Diagnosis not present

## 2021-09-01 DIAGNOSIS — Z7951 Long term (current) use of inhaled steroids: Secondary | ICD-10-CM | POA: Diagnosis not present

## 2021-09-01 DIAGNOSIS — M810 Age-related osteoporosis without current pathological fracture: Secondary | ICD-10-CM | POA: Diagnosis not present

## 2021-09-01 DIAGNOSIS — E785 Hyperlipidemia, unspecified: Secondary | ICD-10-CM | POA: Diagnosis not present

## 2021-09-02 ENCOUNTER — Inpatient Hospital Stay: Payer: Medicare HMO | Attending: Oncology | Admitting: Oncology

## 2021-09-02 ENCOUNTER — Encounter: Payer: Self-pay | Admitting: Oncology

## 2021-09-02 DIAGNOSIS — C44712 Basal cell carcinoma of skin of right lower limb, including hip: Secondary | ICD-10-CM | POA: Diagnosis not present

## 2021-09-03 DIAGNOSIS — Z7951 Long term (current) use of inhaled steroids: Secondary | ICD-10-CM | POA: Diagnosis not present

## 2021-09-03 DIAGNOSIS — M810 Age-related osteoporosis without current pathological fracture: Secondary | ICD-10-CM | POA: Diagnosis not present

## 2021-09-03 DIAGNOSIS — F419 Anxiety disorder, unspecified: Secondary | ICD-10-CM | POA: Diagnosis not present

## 2021-09-03 DIAGNOSIS — Z87891 Personal history of nicotine dependence: Secondary | ICD-10-CM | POA: Diagnosis not present

## 2021-09-03 DIAGNOSIS — I10 Essential (primary) hypertension: Secondary | ICD-10-CM | POA: Diagnosis not present

## 2021-09-03 DIAGNOSIS — J45909 Unspecified asthma, uncomplicated: Secondary | ICD-10-CM | POA: Diagnosis not present

## 2021-09-03 DIAGNOSIS — I700 Atherosclerosis of aorta: Secondary | ICD-10-CM | POA: Diagnosis not present

## 2021-09-03 DIAGNOSIS — J439 Emphysema, unspecified: Secondary | ICD-10-CM | POA: Diagnosis not present

## 2021-09-03 DIAGNOSIS — R768 Other specified abnormal immunological findings in serum: Secondary | ICD-10-CM | POA: Diagnosis not present

## 2021-09-03 DIAGNOSIS — R69 Illness, unspecified: Secondary | ICD-10-CM | POA: Diagnosis not present

## 2021-09-03 DIAGNOSIS — Z85828 Personal history of other malignant neoplasm of skin: Secondary | ICD-10-CM | POA: Diagnosis not present

## 2021-09-03 DIAGNOSIS — E785 Hyperlipidemia, unspecified: Secondary | ICD-10-CM | POA: Diagnosis not present

## 2021-09-03 DIAGNOSIS — C44712 Basal cell carcinoma of skin of right lower limb, including hip: Secondary | ICD-10-CM | POA: Diagnosis not present

## 2021-09-03 DIAGNOSIS — Z483 Aftercare following surgery for neoplasm: Secondary | ICD-10-CM | POA: Diagnosis not present

## 2021-09-03 DIAGNOSIS — I712 Thoracic aortic aneurysm, without rupture, unspecified: Secondary | ICD-10-CM | POA: Diagnosis not present

## 2021-09-07 DIAGNOSIS — R69 Illness, unspecified: Secondary | ICD-10-CM | POA: Diagnosis not present

## 2021-09-07 DIAGNOSIS — Z85828 Personal history of other malignant neoplasm of skin: Secondary | ICD-10-CM | POA: Diagnosis not present

## 2021-09-07 DIAGNOSIS — Z483 Aftercare following surgery for neoplasm: Secondary | ICD-10-CM | POA: Diagnosis not present

## 2021-09-07 DIAGNOSIS — I700 Atherosclerosis of aorta: Secondary | ICD-10-CM | POA: Diagnosis not present

## 2021-09-07 DIAGNOSIS — I10 Essential (primary) hypertension: Secondary | ICD-10-CM | POA: Diagnosis not present

## 2021-09-07 DIAGNOSIS — Z87891 Personal history of nicotine dependence: Secondary | ICD-10-CM | POA: Diagnosis not present

## 2021-09-07 DIAGNOSIS — Z7951 Long term (current) use of inhaled steroids: Secondary | ICD-10-CM | POA: Diagnosis not present

## 2021-09-07 DIAGNOSIS — M810 Age-related osteoporosis without current pathological fracture: Secondary | ICD-10-CM | POA: Diagnosis not present

## 2021-09-07 DIAGNOSIS — F419 Anxiety disorder, unspecified: Secondary | ICD-10-CM | POA: Diagnosis not present

## 2021-09-07 DIAGNOSIS — J439 Emphysema, unspecified: Secondary | ICD-10-CM | POA: Diagnosis not present

## 2021-09-07 DIAGNOSIS — C44712 Basal cell carcinoma of skin of right lower limb, including hip: Secondary | ICD-10-CM | POA: Diagnosis not present

## 2021-09-07 DIAGNOSIS — I712 Thoracic aortic aneurysm, without rupture, unspecified: Secondary | ICD-10-CM | POA: Diagnosis not present

## 2021-09-07 DIAGNOSIS — J45909 Unspecified asthma, uncomplicated: Secondary | ICD-10-CM | POA: Diagnosis not present

## 2021-09-07 DIAGNOSIS — E785 Hyperlipidemia, unspecified: Secondary | ICD-10-CM | POA: Diagnosis not present

## 2021-09-07 DIAGNOSIS — R768 Other specified abnormal immunological findings in serum: Secondary | ICD-10-CM | POA: Diagnosis not present

## 2021-09-10 DIAGNOSIS — I712 Thoracic aortic aneurysm, without rupture, unspecified: Secondary | ICD-10-CM | POA: Diagnosis not present

## 2021-09-10 DIAGNOSIS — I700 Atherosclerosis of aorta: Secondary | ICD-10-CM | POA: Diagnosis not present

## 2021-09-10 DIAGNOSIS — Z483 Aftercare following surgery for neoplasm: Secondary | ICD-10-CM | POA: Diagnosis not present

## 2021-09-10 DIAGNOSIS — Z85828 Personal history of other malignant neoplasm of skin: Secondary | ICD-10-CM | POA: Diagnosis not present

## 2021-09-10 DIAGNOSIS — M810 Age-related osteoporosis without current pathological fracture: Secondary | ICD-10-CM | POA: Diagnosis not present

## 2021-09-10 DIAGNOSIS — F419 Anxiety disorder, unspecified: Secondary | ICD-10-CM | POA: Diagnosis not present

## 2021-09-10 DIAGNOSIS — J45909 Unspecified asthma, uncomplicated: Secondary | ICD-10-CM | POA: Diagnosis not present

## 2021-09-10 DIAGNOSIS — R768 Other specified abnormal immunological findings in serum: Secondary | ICD-10-CM | POA: Diagnosis not present

## 2021-09-10 DIAGNOSIS — J439 Emphysema, unspecified: Secondary | ICD-10-CM | POA: Diagnosis not present

## 2021-09-10 DIAGNOSIS — C44712 Basal cell carcinoma of skin of right lower limb, including hip: Secondary | ICD-10-CM | POA: Diagnosis not present

## 2021-09-10 DIAGNOSIS — Z7951 Long term (current) use of inhaled steroids: Secondary | ICD-10-CM | POA: Diagnosis not present

## 2021-09-10 DIAGNOSIS — I10 Essential (primary) hypertension: Secondary | ICD-10-CM | POA: Diagnosis not present

## 2021-09-10 DIAGNOSIS — E785 Hyperlipidemia, unspecified: Secondary | ICD-10-CM | POA: Diagnosis not present

## 2021-09-10 DIAGNOSIS — Z87891 Personal history of nicotine dependence: Secondary | ICD-10-CM | POA: Diagnosis not present

## 2021-09-12 ENCOUNTER — Encounter: Payer: Self-pay | Admitting: Family Medicine

## 2021-09-13 ENCOUNTER — Other Ambulatory Visit: Payer: Self-pay | Admitting: Family Medicine

## 2021-09-13 MED ORDER — FLUTICASONE FUROATE-VILANTEROL 200-25 MCG/ACT IN AEPB: 1.0000 | INHALATION_SPRAY | Freq: Every day | RESPIRATORY_TRACT | 2 refills | Status: DC

## 2021-09-14 DIAGNOSIS — Z85828 Personal history of other malignant neoplasm of skin: Secondary | ICD-10-CM | POA: Diagnosis not present

## 2021-09-14 DIAGNOSIS — J439 Emphysema, unspecified: Secondary | ICD-10-CM | POA: Diagnosis not present

## 2021-09-14 DIAGNOSIS — C44712 Basal cell carcinoma of skin of right lower limb, including hip: Secondary | ICD-10-CM | POA: Diagnosis not present

## 2021-09-14 DIAGNOSIS — Z483 Aftercare following surgery for neoplasm: Secondary | ICD-10-CM | POA: Diagnosis not present

## 2021-09-14 DIAGNOSIS — J45909 Unspecified asthma, uncomplicated: Secondary | ICD-10-CM | POA: Diagnosis not present

## 2021-09-14 DIAGNOSIS — I700 Atherosclerosis of aorta: Secondary | ICD-10-CM | POA: Diagnosis not present

## 2021-09-14 DIAGNOSIS — F419 Anxiety disorder, unspecified: Secondary | ICD-10-CM | POA: Diagnosis not present

## 2021-09-14 DIAGNOSIS — E785 Hyperlipidemia, unspecified: Secondary | ICD-10-CM | POA: Diagnosis not present

## 2021-09-14 DIAGNOSIS — Z7951 Long term (current) use of inhaled steroids: Secondary | ICD-10-CM | POA: Diagnosis not present

## 2021-09-14 DIAGNOSIS — Z87891 Personal history of nicotine dependence: Secondary | ICD-10-CM | POA: Diagnosis not present

## 2021-09-14 DIAGNOSIS — I712 Thoracic aortic aneurysm, without rupture, unspecified: Secondary | ICD-10-CM | POA: Diagnosis not present

## 2021-09-14 DIAGNOSIS — R768 Other specified abnormal immunological findings in serum: Secondary | ICD-10-CM | POA: Diagnosis not present

## 2021-09-14 DIAGNOSIS — M810 Age-related osteoporosis without current pathological fracture: Secondary | ICD-10-CM | POA: Diagnosis not present

## 2021-09-14 DIAGNOSIS — I10 Essential (primary) hypertension: Secondary | ICD-10-CM | POA: Diagnosis not present

## 2021-09-15 DIAGNOSIS — Z85828 Personal history of other malignant neoplasm of skin: Secondary | ICD-10-CM | POA: Diagnosis not present

## 2021-09-15 DIAGNOSIS — I10 Essential (primary) hypertension: Secondary | ICD-10-CM | POA: Diagnosis not present

## 2021-09-15 DIAGNOSIS — Z483 Aftercare following surgery for neoplasm: Secondary | ICD-10-CM | POA: Diagnosis not present

## 2021-09-15 DIAGNOSIS — J45909 Unspecified asthma, uncomplicated: Secondary | ICD-10-CM | POA: Diagnosis not present

## 2021-09-15 DIAGNOSIS — M810 Age-related osteoporosis without current pathological fracture: Secondary | ICD-10-CM | POA: Diagnosis not present

## 2021-09-15 DIAGNOSIS — Z87891 Personal history of nicotine dependence: Secondary | ICD-10-CM | POA: Diagnosis not present

## 2021-09-15 DIAGNOSIS — I700 Atherosclerosis of aorta: Secondary | ICD-10-CM | POA: Diagnosis not present

## 2021-09-15 DIAGNOSIS — F419 Anxiety disorder, unspecified: Secondary | ICD-10-CM | POA: Diagnosis not present

## 2021-09-15 DIAGNOSIS — E785 Hyperlipidemia, unspecified: Secondary | ICD-10-CM | POA: Diagnosis not present

## 2021-09-15 DIAGNOSIS — C44712 Basal cell carcinoma of skin of right lower limb, including hip: Secondary | ICD-10-CM | POA: Diagnosis not present

## 2021-09-15 DIAGNOSIS — R768 Other specified abnormal immunological findings in serum: Secondary | ICD-10-CM | POA: Diagnosis not present

## 2021-09-15 DIAGNOSIS — Z7951 Long term (current) use of inhaled steroids: Secondary | ICD-10-CM | POA: Diagnosis not present

## 2021-09-15 DIAGNOSIS — I712 Thoracic aortic aneurysm, without rupture, unspecified: Secondary | ICD-10-CM | POA: Diagnosis not present

## 2021-09-15 DIAGNOSIS — J439 Emphysema, unspecified: Secondary | ICD-10-CM | POA: Diagnosis not present

## 2021-09-17 DIAGNOSIS — I712 Thoracic aortic aneurysm, without rupture, unspecified: Secondary | ICD-10-CM | POA: Diagnosis not present

## 2021-09-17 DIAGNOSIS — Z87891 Personal history of nicotine dependence: Secondary | ICD-10-CM | POA: Diagnosis not present

## 2021-09-17 DIAGNOSIS — J439 Emphysema, unspecified: Secondary | ICD-10-CM | POA: Diagnosis not present

## 2021-09-17 DIAGNOSIS — I10 Essential (primary) hypertension: Secondary | ICD-10-CM | POA: Diagnosis not present

## 2021-09-17 DIAGNOSIS — I700 Atherosclerosis of aorta: Secondary | ICD-10-CM | POA: Diagnosis not present

## 2021-09-17 DIAGNOSIS — Z85828 Personal history of other malignant neoplasm of skin: Secondary | ICD-10-CM | POA: Diagnosis not present

## 2021-09-17 DIAGNOSIS — J45909 Unspecified asthma, uncomplicated: Secondary | ICD-10-CM | POA: Diagnosis not present

## 2021-09-17 DIAGNOSIS — E785 Hyperlipidemia, unspecified: Secondary | ICD-10-CM | POA: Diagnosis not present

## 2021-09-17 DIAGNOSIS — Z483 Aftercare following surgery for neoplasm: Secondary | ICD-10-CM | POA: Diagnosis not present

## 2021-09-17 DIAGNOSIS — Z7951 Long term (current) use of inhaled steroids: Secondary | ICD-10-CM | POA: Diagnosis not present

## 2021-09-17 DIAGNOSIS — R768 Other specified abnormal immunological findings in serum: Secondary | ICD-10-CM | POA: Diagnosis not present

## 2021-09-17 DIAGNOSIS — F419 Anxiety disorder, unspecified: Secondary | ICD-10-CM | POA: Diagnosis not present

## 2021-09-17 DIAGNOSIS — M810 Age-related osteoporosis without current pathological fracture: Secondary | ICD-10-CM | POA: Diagnosis not present

## 2021-09-17 DIAGNOSIS — C44712 Basal cell carcinoma of skin of right lower limb, including hip: Secondary | ICD-10-CM | POA: Diagnosis not present

## 2021-09-21 DIAGNOSIS — C44712 Basal cell carcinoma of skin of right lower limb, including hip: Secondary | ICD-10-CM | POA: Diagnosis not present

## 2021-09-21 DIAGNOSIS — M810 Age-related osteoporosis without current pathological fracture: Secondary | ICD-10-CM | POA: Diagnosis not present

## 2021-09-21 DIAGNOSIS — R768 Other specified abnormal immunological findings in serum: Secondary | ICD-10-CM | POA: Diagnosis not present

## 2021-09-21 DIAGNOSIS — J439 Emphysema, unspecified: Secondary | ICD-10-CM | POA: Diagnosis not present

## 2021-09-21 DIAGNOSIS — F419 Anxiety disorder, unspecified: Secondary | ICD-10-CM | POA: Diagnosis not present

## 2021-09-21 DIAGNOSIS — Z85828 Personal history of other malignant neoplasm of skin: Secondary | ICD-10-CM | POA: Diagnosis not present

## 2021-09-21 DIAGNOSIS — Z7951 Long term (current) use of inhaled steroids: Secondary | ICD-10-CM | POA: Diagnosis not present

## 2021-09-21 DIAGNOSIS — I712 Thoracic aortic aneurysm, without rupture, unspecified: Secondary | ICD-10-CM | POA: Diagnosis not present

## 2021-09-21 DIAGNOSIS — J45909 Unspecified asthma, uncomplicated: Secondary | ICD-10-CM | POA: Diagnosis not present

## 2021-09-21 DIAGNOSIS — I10 Essential (primary) hypertension: Secondary | ICD-10-CM | POA: Diagnosis not present

## 2021-09-21 DIAGNOSIS — Z483 Aftercare following surgery for neoplasm: Secondary | ICD-10-CM | POA: Diagnosis not present

## 2021-09-21 DIAGNOSIS — I700 Atherosclerosis of aorta: Secondary | ICD-10-CM | POA: Diagnosis not present

## 2021-09-21 DIAGNOSIS — E785 Hyperlipidemia, unspecified: Secondary | ICD-10-CM | POA: Diagnosis not present

## 2021-09-21 DIAGNOSIS — Z87891 Personal history of nicotine dependence: Secondary | ICD-10-CM | POA: Diagnosis not present

## 2021-09-22 DIAGNOSIS — E785 Hyperlipidemia, unspecified: Secondary | ICD-10-CM | POA: Diagnosis not present

## 2021-09-22 DIAGNOSIS — M810 Age-related osteoporosis without current pathological fracture: Secondary | ICD-10-CM | POA: Diagnosis not present

## 2021-09-22 DIAGNOSIS — J45909 Unspecified asthma, uncomplicated: Secondary | ICD-10-CM | POA: Diagnosis not present

## 2021-09-22 DIAGNOSIS — I10 Essential (primary) hypertension: Secondary | ICD-10-CM | POA: Diagnosis not present

## 2021-09-22 DIAGNOSIS — I712 Thoracic aortic aneurysm, without rupture, unspecified: Secondary | ICD-10-CM | POA: Diagnosis not present

## 2021-09-22 DIAGNOSIS — I700 Atherosclerosis of aorta: Secondary | ICD-10-CM | POA: Diagnosis not present

## 2021-09-22 DIAGNOSIS — R69 Illness, unspecified: Secondary | ICD-10-CM | POA: Diagnosis not present

## 2021-09-22 DIAGNOSIS — C44712 Basal cell carcinoma of skin of right lower limb, including hip: Secondary | ICD-10-CM | POA: Diagnosis not present

## 2021-09-22 DIAGNOSIS — J439 Emphysema, unspecified: Secondary | ICD-10-CM | POA: Diagnosis not present

## 2021-09-22 DIAGNOSIS — Z483 Aftercare following surgery for neoplasm: Secondary | ICD-10-CM | POA: Diagnosis not present

## 2021-09-24 DIAGNOSIS — Z87891 Personal history of nicotine dependence: Secondary | ICD-10-CM | POA: Diagnosis not present

## 2021-09-24 DIAGNOSIS — E785 Hyperlipidemia, unspecified: Secondary | ICD-10-CM | POA: Diagnosis not present

## 2021-09-24 DIAGNOSIS — Z483 Aftercare following surgery for neoplasm: Secondary | ICD-10-CM | POA: Diagnosis not present

## 2021-09-24 DIAGNOSIS — I712 Thoracic aortic aneurysm, without rupture, unspecified: Secondary | ICD-10-CM | POA: Diagnosis not present

## 2021-09-24 DIAGNOSIS — I700 Atherosclerosis of aorta: Secondary | ICD-10-CM | POA: Diagnosis not present

## 2021-09-24 DIAGNOSIS — C44712 Basal cell carcinoma of skin of right lower limb, including hip: Secondary | ICD-10-CM | POA: Diagnosis not present

## 2021-09-24 DIAGNOSIS — F419 Anxiety disorder, unspecified: Secondary | ICD-10-CM | POA: Diagnosis not present

## 2021-09-24 DIAGNOSIS — J45909 Unspecified asthma, uncomplicated: Secondary | ICD-10-CM | POA: Diagnosis not present

## 2021-09-24 DIAGNOSIS — J439 Emphysema, unspecified: Secondary | ICD-10-CM | POA: Diagnosis not present

## 2021-09-24 DIAGNOSIS — R768 Other specified abnormal immunological findings in serum: Secondary | ICD-10-CM | POA: Diagnosis not present

## 2021-09-24 DIAGNOSIS — I10 Essential (primary) hypertension: Secondary | ICD-10-CM | POA: Diagnosis not present

## 2021-09-24 DIAGNOSIS — Z85828 Personal history of other malignant neoplasm of skin: Secondary | ICD-10-CM | POA: Diagnosis not present

## 2021-09-24 DIAGNOSIS — M810 Age-related osteoporosis without current pathological fracture: Secondary | ICD-10-CM | POA: Diagnosis not present

## 2021-09-24 DIAGNOSIS — Z7951 Long term (current) use of inhaled steroids: Secondary | ICD-10-CM | POA: Diagnosis not present

## 2021-09-28 DIAGNOSIS — I712 Thoracic aortic aneurysm, without rupture, unspecified: Secondary | ICD-10-CM | POA: Diagnosis not present

## 2021-09-28 DIAGNOSIS — I700 Atherosclerosis of aorta: Secondary | ICD-10-CM | POA: Diagnosis not present

## 2021-09-28 DIAGNOSIS — Z483 Aftercare following surgery for neoplasm: Secondary | ICD-10-CM | POA: Diagnosis not present

## 2021-09-28 DIAGNOSIS — J439 Emphysema, unspecified: Secondary | ICD-10-CM | POA: Diagnosis not present

## 2021-09-28 DIAGNOSIS — C44712 Basal cell carcinoma of skin of right lower limb, including hip: Secondary | ICD-10-CM | POA: Diagnosis not present

## 2021-09-28 DIAGNOSIS — M810 Age-related osteoporosis without current pathological fracture: Secondary | ICD-10-CM | POA: Diagnosis not present

## 2021-09-28 DIAGNOSIS — Z87891 Personal history of nicotine dependence: Secondary | ICD-10-CM | POA: Diagnosis not present

## 2021-09-28 DIAGNOSIS — E785 Hyperlipidemia, unspecified: Secondary | ICD-10-CM | POA: Diagnosis not present

## 2021-09-28 DIAGNOSIS — F419 Anxiety disorder, unspecified: Secondary | ICD-10-CM | POA: Diagnosis not present

## 2021-09-28 DIAGNOSIS — Z85828 Personal history of other malignant neoplasm of skin: Secondary | ICD-10-CM | POA: Diagnosis not present

## 2021-09-28 DIAGNOSIS — R768 Other specified abnormal immunological findings in serum: Secondary | ICD-10-CM | POA: Diagnosis not present

## 2021-09-28 DIAGNOSIS — Z7951 Long term (current) use of inhaled steroids: Secondary | ICD-10-CM | POA: Diagnosis not present

## 2021-09-28 DIAGNOSIS — J45909 Unspecified asthma, uncomplicated: Secondary | ICD-10-CM | POA: Diagnosis not present

## 2021-09-28 DIAGNOSIS — I10 Essential (primary) hypertension: Secondary | ICD-10-CM | POA: Diagnosis not present

## 2021-09-30 ENCOUNTER — Telehealth: Payer: Self-pay

## 2021-09-30 NOTE — Progress Notes (Signed)
Chronic Care Management Pharmacy Assistant   Name: Gowri Suchan  MRN: [id number] DOB: 12-Feb-1951  Reason for Encounter: Asthma Disease State Call  Recent office visits:  None ID  Recent consult visits:  09/02/2021 [name], MD (Oncology) for Follow-up- No medication changes noted, no orders placed, No follow-up noted  Hospital visits:  Medication Reconciliation was completed by comparing discharge summary, patients EMR and Pharmacy list, and upon discussion with patient.  Admitted to the hospital on 08/02/2021 due to Surgery Radical Resection of Tumor Soft Tissue of Thigh or Knee area. Discharge date was 08/06/2021. Discharged from [place]?Medications Started at [facility] Discharge:?? -Started  Enoxaparin (Lovenox) 30 mg Inject 0.3 mL under the skin daily for 28 days Gabapentin 100 mg Capsule 1 capsule three times daily prn Oxycodone-Acetaminophen 5-325 mg take 1-2 tablets q4h prn for pain X 5 days  Medication Changes at Hospital Discharge: -Changed None ID  Medications Discontinued at Hospital Discharge: -Stopped None ID  Medications that remain the same after Hospital Discharge:??  -All other medications will remain the same.    Medications: Outpatient Encounter Medications as of 09/30/2021  Medication Sig Note   fluticasone furoate-vilanterol (BREO ELLIPTA) 200-25 MCG/ACT AEPB Inhale 1 puff into the lungs daily.    albuterol (VENTOLIN HFA) 108 (90 Base) MCG/ACT inhaler Inhale 2 puffs into the lungs every 6 (six) hours as needed for wheezing or shortness of breath. Patient requests generic Ventolin    amLODipine (NORVASC) 5 MG tablet Take 1 tablet (5 mg total) by mouth daily.    benzonatate (TESSALON) 100 MG capsule Take 1-2 capsules (100-200 mg total) by mouth 2 (two) times daily as needed.    Cholecalciferol (VITAMIN D3) 250 MCG (10000 UT) TABS Take 1 tablet by mouth 2 (two) times a week.    diazepam (VALIUM) 5 MG tablet Take 1 tablet (5 mg  total) by mouth daily as needed for anxiety. 30 minutes before doctor's visit    gabapentin (NEURONTIN) 100 MG capsule Take by mouth.    montelukast (SINGULAIR) 10 MG tablet Take 1 tablet (10 mg total) by mouth at bedtime.    Multiple Vitamin (MULTIVITAMIN) capsule Take 2 capsules by mouth daily.    OVER THE COUNTER MEDICATION Take 1 tablet by mouth 2 (two) times daily. Nutri-Calm 09/11/2018: Only with radiation   OVER THE COUNTER MEDICATION Life extension bone strength / life extension skin restore ceramide / life extension super bio curcumin    oxyCODONE-acetaminophen (PERCOCET/ROXICET) 5-325 MG tablet Take by mouth.    valsartan (DIOVAN) 160 MG tablet TAKE 1 TABLET BY MOUTH EVERY DAY    No facility-administered encounter medications on file as of 09/30/2021.   Care Gaps: COVID-19 Vaccine (patient refuses) PNA Vaccine Zoster Vaccine Colonoscopy  Star Rating Drugs: Valsartan 160 mg last filled on 08/11/2021 for a 90-Day supply with [facility]  Asthma  Current Asthma regimen: Breo Inhale one puff daily, Albuterol 108 2 puffs q6h prn wheezing or shortness of breath No flowsheet data found.  Any recent hospitalizations or ED visits since last visit with CPP? Yes for surgery  Denies Asthma symptoms, including Increased shortness of breath , Rescue medicine is not helping, Shortness of breath at rest, Symptoms worse with exercise, Symptoms worse at night, and Wheezing  What recent interventions/DTPs have been made by any provider to improve breathing since last visit: None  Have you had exacerbation/flare-up since last visit? No  What do you do when you are short of breath?  Rescue medication and Rest  Respiratory Devices/Equipment Do you have a nebulizer? No Do you use a Peak Flow Meter? No Do you use a maintenance inhaler? Yes How often do you forget to use your daily inhaler? Never Do you use a rescue inhaler? Yes How often do you use your rescue inhaler?  infrequently Do you  use a spacer with your inhaler? No  Adherence Review: Does the patient have >5 day gap between last estimated fill date for maintenance inhaler medications? No  I spoke with the patient's spouse, and he advised that the patient had 2 surgeries. Once in the month of November which the patient informed me about, but her spouse advised she had to have another one in December. He did advise the patient is doing well. He reports that she was able to visit him last weekend, but she is still dependent and not able to fully take care of herself at this time. The patient is staying with a friend, and will be back next weekend to see him. Patient's spouse report that she is currently doing PT in the home, and has home healthcare coming to visit her as well. He advised they were thinking of doing a skin graft, but the patient is healing  so well that may not have to happen.   I contacted patient, and left a voicemail requesting her to call me back so I could speak to her for a brief moment.   Spoke to the patient, and she is doing well. No issues with her Asthma. Patient is just trying to get better from surgery. She still has away to go. Patient has home healthcare coming out, and she is following up with her providers regularly. Patient has no concerns or issues at this time.  Patient has a telephone appointment with [name], CPP on 11/10/2021 @ [place], CPA/CMA Catering manager Phone: [phone]

## 2021-10-07 DIAGNOSIS — I10 Essential (primary) hypertension: Secondary | ICD-10-CM | POA: Diagnosis not present

## 2021-10-07 DIAGNOSIS — J45909 Unspecified asthma, uncomplicated: Secondary | ICD-10-CM | POA: Diagnosis not present

## 2021-10-07 DIAGNOSIS — R768 Other specified abnormal immunological findings in serum: Secondary | ICD-10-CM | POA: Diagnosis not present

## 2021-10-07 DIAGNOSIS — I700 Atherosclerosis of aorta: Secondary | ICD-10-CM | POA: Diagnosis not present

## 2021-10-07 DIAGNOSIS — Z85828 Personal history of other malignant neoplasm of skin: Secondary | ICD-10-CM | POA: Diagnosis not present

## 2021-10-07 DIAGNOSIS — Z7951 Long term (current) use of inhaled steroids: Secondary | ICD-10-CM | POA: Diagnosis not present

## 2021-10-07 DIAGNOSIS — M810 Age-related osteoporosis without current pathological fracture: Secondary | ICD-10-CM | POA: Diagnosis not present

## 2021-10-07 DIAGNOSIS — Z87891 Personal history of nicotine dependence: Secondary | ICD-10-CM | POA: Diagnosis not present

## 2021-10-07 DIAGNOSIS — C44712 Basal cell carcinoma of skin of right lower limb, including hip: Secondary | ICD-10-CM | POA: Diagnosis not present

## 2021-10-07 DIAGNOSIS — Z483 Aftercare following surgery for neoplasm: Secondary | ICD-10-CM | POA: Diagnosis not present

## 2021-10-07 DIAGNOSIS — J439 Emphysema, unspecified: Secondary | ICD-10-CM | POA: Diagnosis not present

## 2021-10-07 DIAGNOSIS — E785 Hyperlipidemia, unspecified: Secondary | ICD-10-CM | POA: Diagnosis not present

## 2021-10-07 DIAGNOSIS — F419 Anxiety disorder, unspecified: Secondary | ICD-10-CM | POA: Diagnosis not present

## 2021-10-07 DIAGNOSIS — I712 Thoracic aortic aneurysm, without rupture, unspecified: Secondary | ICD-10-CM | POA: Diagnosis not present

## 2021-10-07 NOTE — Progress Notes (Signed)
Name: Patricia Galloway   MRN: [id number]    DOB: 1950-11-29   Date:10/08/2021       Progress Note  Subjective  Chief Complaint  Follow Up  I connected with  Patricia Galloway  on 10/08/21 at 10:00 AM EST by a video enabled telemedicine application and verified that I am speaking with the correct person using two identifiers.  I discussed the limitations of evaluation and management by telemedicine and the availability of in person appointments. The patient expressed understanding and agreed to proceed with the virtual visit  Staff also discussed with the patient that there may be a patient responsible charge related to this service. Patient Location: at friend's house  Provider Location: [facility] Additional Individuals present: alone   HPI  Basal cell carcinoma right leg: she is doing well, s/p removal and chemotherapy. Her gait is back to normal , full rom of motion.  Under the care of Dr. Grayland Ormond . She states she has intermittent aching at the scar area, after last visit with  me she saw a new nodule, had a PET scan , biopsy and MRI femur that showed recurrence of cancer, seen by Dr. Percival Spanish ortho oncologist and had another excisional surgery done 07/2021 and second one 12/22 , she was not very imobile initially, finally graduated to a walker. Able to stand for a small periods of time, currently staying with a friend but going back home on Monday. Her friend was carrying for her . She is off pain medication for the past few weeks, takes Tylenol prn   Lung nodules: monitored by Dr. Grayland Ormond ,she had repeat CT done 03/22 stable for over one year.    Hyperlipidemia:she refuses statin therapy , unchanged  HTN with white coat: bp is finally controlled without side effects, taking valsartan in am's and norvasc in pm valium prior to office visits, she had bp checked by home health nurse and it was down to 128/77. No chest pain or palpitation    Major Depression in remission:  She is only taking valium prn  . She is thankful she has a close friend that took her in to recover from recent surgery    Asthma/COPD: she has been doing well on Breo daily, also uses ventolin prn, she states singulair and tessalon perles prn. She could not tolerate Trelegy but has been doing well on Breo  She had a CT lund done 03/22. She states symptoms are controlled with medications. She has noticed more SOB due to deconditioning since the surgery end of 22  no wheezing or cough    Atherosclerosis aorta: refuses statin therapy also not taking aspirin. Reviewed PET scan from 12/15/2020   Osteoporosis: discussed options, she was seen by Dr Gabriel Carina 03/2018 and was given Alendronate but she states never started medication, she states she has a high calcium diet and some otc supplementations. She is afraid of side effects and does not like taking medications   Chronic Pancreatitis: found on CT abdomen and also on PET scan done on 11/06/2019 and also on pet done 12/18/2020 , denies nausea, vomiting or indigestion. Unchanged   Aneurysm of ascending aorta: found on CT 2019, but not on CT 2020 and 2021,and 2022  discussed CTA or MRA, but she is not interested on follow up . Unchanged   Patient Active Problem List   Diagnosis Date Noted   Basal cell carcinoma 03/29/2017   Thoracic aortic aneurysm without rupture 10/31/2016   Atherosclerosis of aorta ([place]) 10/31/2016  Chronic pancreatitis ([place]) 10/31/2016   Pulmonary nodule 10/31/2016   Centrilobular emphysema ([place]) 10/31/2016   Iron deficiency anemia due to chronic blood loss 10/24/2016   Basal cell carcinoma, leg, right 08/24/2016   Hyperglycemia 08/01/2016   Chronic thoracic back pain 08/04/2015   Panic attack 08/04/2015   White coat syndrome without diagnosis of hypertension 08/04/2015   Major depression, recurrent ([place]) 08/04/2015   Asthma, moderate persistent, poorly-controlled 08/04/2015    Past Surgical History:  Procedure Laterality Date   BASAL CELL CARCINOMA  EXCISION Right 03/29/2017   Procedure: EXCISION OF RIGHT LEG BASEL CELL CARCINOMA;  Surgeon: [name], DO;  Location: [facility];  Service: Plastics;  Laterality: Right;   COLON SURGERY  2002   colostomy for 10 months after a perfurated sigmoid    COLOSTOMY REVERSAL  2003   EMBOLIZATION Right 10/12/2016   Procedure: Embolization;  Surgeon: [name], MD;  Location: [facility];  Service: Cardiovascular;  Laterality: Right;   etopic  0017,4944   INCISION AND DRAINAGE OF WOUND Right 03/30/2017   Procedure: IRRIGATION AND DEBRIDEMENT WOUND; VAC PLACEMENT;  Surgeon: [name], DO;  Location: [facility];  Service: Plastics;  Laterality: Right;   TONSILLECTOMY      Family History  Problem Relation Age of Onset   Dementia Mother    Aortic stenosis Mother    Osteoporosis Mother    Dementia Father    Diabetes Father    Hyperlipidemia Father    Hypertension Father    Aneurysm Sister    Hyperlipidemia Sister    Hypertension Sister    Diabetes Sister    Breast cancer Neg Hx     Social History   Socioeconomic History   Marital status: Married    Spouse name: John   Number of children: 0   Years of education: Not on file   Highest education level: Bachelor's degree (e.g., BA, AB, BS)  Occupational History   Occupation: Retired  Tobacco Use   Smoking status: Former    Packs/day: 0.50    Years: 25.00    Pack years: 12.50    Types: Cigarettes    Start date: 09/05/1972    Quit date: 08/03/1998    Years since quitting: 23.1   Smokeless tobacco: Never   Tobacco comments:    smoking cessation materials not required  Vaping Use   Vaping Use: Never used  Substance and Sexual Activity   Alcohol use: No    Alcohol/week: 0.0 standard drinks   Drug use: No   Sexual activity: Not Currently    Birth control/protection: Post-menopausal  Other Topics Concern   Not on file  Social History Narrative   Not on file   Social Determinants of Health   Financial  Resource Strain: High Risk   Difficulty of Paying Living Expenses: Hard  Food Insecurity: No Food Insecurity   Worried About Running Out of Food in the Last Year: Never true   Ran Out of Food in the Last Year: Never true  Transportation Needs: No Transportation Needs   Lack of Transportation (Medical): No   Lack of Transportation (Non-Medical): No  Physical Activity: Inactive   Days of Exercise per Week: 0 days   Minutes of Exercise per Session: 0 min  Stress: Stress Concern Present   Feeling of Stress : To some extent  Social Connections: Not on file  Intimate Partner Violence: Not At Risk   Fear of Current or Ex-Partner: No   Emotionally Abused:  No   Physically Abused: No   Sexually Abused: No     Current Outpatient Medications:    albuterol (VENTOLIN HFA) 108 (90 Base) MCG/ACT inhaler, Inhale 2 puffs into the lungs every 6 (six) hours as needed for wheezing or shortness of breath. Patient requests generic Ventolin, Disp: 18 g, Rfl: 0   amLODipine (NORVASC) 5 MG tablet, Take 1 tablet (5 mg total) by mouth daily., Disp: 90 tablet, Rfl: 1   benzonatate (TESSALON) 100 MG capsule, Take 1-2 capsules (100-200 mg total) by mouth 2 (two) times daily as needed., Disp: 40 capsule, Rfl: 0   Cholecalciferol (VITAMIN D3) 250 MCG (10000 UT) TABS, Take 1 tablet by mouth 2 (two) times a week., Disp: , Rfl:    diazepam (VALIUM) 5 MG tablet, Take 1 tablet (5 mg total) by mouth daily as needed for anxiety. 30 minutes before doctor's visit, Disp: 30 tablet, Rfl: 0   fluticasone furoate-vilanterol (BREO ELLIPTA) 200-25 MCG/ACT AEPB, Inhale 1 puff into the lungs daily., Disp: 60 each, Rfl: 2   gabapentin (NEURONTIN) 100 MG capsule, Take by mouth., Disp: , Rfl:    montelukast (SINGULAIR) 10 MG tablet, TAKE 1 TABLET BY MOUTH EVERYDAY AT BEDTIME, Disp: 90 tablet, Rfl: 1   Multiple Vitamin (MULTIVITAMIN) capsule, Take 2 capsules by mouth daily., Disp: , Rfl:    OVER THE COUNTER MEDICATION, Take 1 tablet by  mouth 2 (two) times daily. Nutri-Calm, Disp: , Rfl:    OVER THE COUNTER MEDICATION, Life extension bone strength / life extension skin restore ceramide / life extension super bio curcumin, Disp: , Rfl:    oxyCODONE-acetaminophen (PERCOCET/ROXICET) 5-325 MG tablet, Take by mouth., Disp: , Rfl:    valsartan (DIOVAN) 160 MG tablet, TAKE 1 TABLET BY MOUTH EVERY DAY, Disp: 90 tablet, Rfl: 1  Allergies  Allergen Reactions   Penicillins Anaphylaxis    Has patient had a PCN reaction causing immediate rash, facial/tongue/throat swelling, SOB or lightheadedness with hypotension: Yes Has patient had a PCN reaction causing severe rash involving mucus membranes or skin necrosis: No Has patient had a PCN reaction that required hospitalization: No Has patient had a PCN reaction occurring within the last 10 years: No If all of the above answers are "NO", then may proceed with Cephalosporin use.    Codeine Nausea And Vomiting   Latex Itching   Sulfa Antibiotics Other (See Comments)    TOPICAL-SULFA CAUSED BLISTERS   Tape Other (See Comments)    Reaction:  Blisters and burning  Pt states that paper tape is okay.     Xopenex [Levalbuterol Hcl] Anxiety    I personally reviewed active problem list, medication list, allergies, family history, social history, health maintenance with the patient/caregiver today.   ROS  Ten systems reviewed and is negative except as mentioned in HPI   Objective  Virtual encounter, vitals not obtained.  There is no height or weight on file to calculate BMI.  Physical Exam  Awake, alert and oriented  Well groomed   PHQ2/9: Depression screen [facility] 2/9 10/08/2021 04/06/2021 03/22/2021 10/06/2020 08/18/2020  Decreased Interest 0 0 0 0 0  Down, Depressed, Hopeless 0 0 0 0 0  PHQ - 2 Score 0 0 0 0 0  Altered sleeping 0 0 - 0 -  Tired, decreased energy 0 3 - 0 -  Change in appetite 0 0 - 0 -  Feeling bad or failure about yourself  0 0 - 0 -  Trouble concentrating 0 0 - 0 -  Moving slowly or fidgety/restless 0 0 - 0 -  Suicidal thoughts 0 0 - 0 -  PHQ-9 Score 0 3 - 0 -  Difficult doing work/chores Not difficult at all - - - -  Some recent data might be hidden   PHQ-2/9 Result is negative.    Fall Risk: Fall Risk  10/08/2021 04/06/2021 10/06/2020 08/18/2020 05/19/2020  Falls in the past year? 0 0 0 0 0  Comment - - - - -  Number falls in past yr: 0 0 0 0 0  Injury with Fall? 0 0 0 0 0  Risk for fall due to : No Fall Risks - - No Fall Risks -  Follow up Falls prevention discussed - - Falls prevention discussed -     Assessment & Plan  1. COPD with asthma ([place])  - benzonatate (TESSALON) 100 MG capsule; Take 1-2 capsules (100-200 mg total) by mouth 2 (two) times daily as needed.  Dispense: 40 capsule; Refill: 0  2. Centrilobular emphysema ([facility])  - fluticasone furoate-vilanterol (BREO ELLIPTA) 200-25 MCG/ACT AEPB; Inhale 1 puff into the lungs daily.  Dispense: 180 each; Refill: 1  3. Other chronic pancreatitis ([place])  Doing well   4. Atherosclerosis of aorta ([place])   5. Recurrent major depressive disorder, in full remission ([place])   6. Dyslipidemia   7. Basal cell carcinoma, leg, right   8. Age-related osteoporosis without current pathological fracture   9. White coat syndrome with diagnosis of hypertension  - amLODipine (NORVASC) 5 MG tablet; Take 1 tablet (5 mg total) by mouth daily.  Dispense: 90 tablet; Refill: 1 - diazepam (VALIUM) 5 MG tablet; Take 1 tablet (5 mg total) by mouth daily as needed for anxiety. 30 minutes before doctor's visit  Dispense: 30 tablet; Refill: 0  10. Aneurysm of ascending aorta without rupture   11. Asthma with COPD ([place])  - fluticasone furoate-vilanterol (BREO ELLIPTA) 200-25 MCG/ACT AEPB; Inhale 1 puff into the lungs daily.  Dispense: 180 each; Refill: 1   I discussed the assessment and treatment plan with the patient. The patient was provided an opportunity to ask questions and all were answered. The  patient agreed with the plan and demonstrated an understanding of the instructions.  The patient was advised to call back or seek an in-person evaluation if the symptoms worsen or if the condition fails to improve as anticipated.  I provided 25  minutes of non-face-to-face time during this encounter.

## 2021-10-08 ENCOUNTER — Telehealth (INDEPENDENT_AMBULATORY_CARE_PROVIDER_SITE_OTHER): Payer: Medicare HMO | Admitting: Family Medicine

## 2021-10-08 ENCOUNTER — Other Ambulatory Visit: Payer: Self-pay | Admitting: Family Medicine

## 2021-10-08 ENCOUNTER — Encounter: Payer: Self-pay | Admitting: Family Medicine

## 2021-10-08 DIAGNOSIS — Z85828 Personal history of other malignant neoplasm of skin: Secondary | ICD-10-CM | POA: Diagnosis not present

## 2021-10-08 DIAGNOSIS — J449 Chronic obstructive pulmonary disease, unspecified: Secondary | ICD-10-CM

## 2021-10-08 DIAGNOSIS — J45909 Unspecified asthma, uncomplicated: Secondary | ICD-10-CM | POA: Diagnosis not present

## 2021-10-08 DIAGNOSIS — Z7951 Long term (current) use of inhaled steroids: Secondary | ICD-10-CM | POA: Diagnosis not present

## 2021-10-08 DIAGNOSIS — M810 Age-related osteoporosis without current pathological fracture: Secondary | ICD-10-CM

## 2021-10-08 DIAGNOSIS — K861 Other chronic pancreatitis: Secondary | ICD-10-CM

## 2021-10-08 DIAGNOSIS — I10 Essential (primary) hypertension: Secondary | ICD-10-CM

## 2021-10-08 DIAGNOSIS — E785 Hyperlipidemia, unspecified: Secondary | ICD-10-CM

## 2021-10-08 DIAGNOSIS — I700 Atherosclerosis of aorta: Secondary | ICD-10-CM | POA: Diagnosis not present

## 2021-10-08 DIAGNOSIS — I7121 Aneurysm of the ascending aorta, without rupture: Secondary | ICD-10-CM

## 2021-10-08 DIAGNOSIS — J432 Centrilobular emphysema: Secondary | ICD-10-CM

## 2021-10-08 DIAGNOSIS — Z483 Aftercare following surgery for neoplasm: Secondary | ICD-10-CM | POA: Diagnosis not present

## 2021-10-08 DIAGNOSIS — F419 Anxiety disorder, unspecified: Secondary | ICD-10-CM | POA: Diagnosis not present

## 2021-10-08 DIAGNOSIS — R69 Illness, unspecified: Secondary | ICD-10-CM | POA: Diagnosis not present

## 2021-10-08 DIAGNOSIS — F3342 Major depressive disorder, recurrent, in full remission: Secondary | ICD-10-CM

## 2021-10-08 DIAGNOSIS — C44712 Basal cell carcinoma of skin of right lower limb, including hip: Secondary | ICD-10-CM

## 2021-10-08 DIAGNOSIS — J439 Emphysema, unspecified: Secondary | ICD-10-CM | POA: Diagnosis not present

## 2021-10-08 DIAGNOSIS — J4489 Other specified chronic obstructive pulmonary disease: Secondary | ICD-10-CM

## 2021-10-08 DIAGNOSIS — Z87891 Personal history of nicotine dependence: Secondary | ICD-10-CM | POA: Diagnosis not present

## 2021-10-08 DIAGNOSIS — R768 Other specified abnormal immunological findings in serum: Secondary | ICD-10-CM | POA: Diagnosis not present

## 2021-10-08 DIAGNOSIS — I712 Thoracic aortic aneurysm, without rupture, unspecified: Secondary | ICD-10-CM | POA: Diagnosis not present

## 2021-10-08 MED ORDER — FLUTICASONE FUROATE-VILANTEROL 200-25 MCG/ACT IN AEPB: 1.0000 | INHALATION_SPRAY | Freq: Every day | RESPIRATORY_TRACT | 1 refills | Status: DC

## 2021-10-08 MED ORDER — BENZONATATE 100 MG PO CAPS: 100.0000 mg | ORAL_CAPSULE | Freq: Two times a day (BID) | ORAL | 0 refills | Status: DC | PRN

## 2021-10-08 MED ORDER — AMLODIPINE BESYLATE 5 MG PO TABS: 5.0000 mg | ORAL_TABLET | Freq: Every day | ORAL | 1 refills | Status: DC

## 2021-10-08 MED ORDER — VALSARTAN 160 MG PO TABS: 160.0000 mg | ORAL_TABLET | Freq: Every day | ORAL | 1 refills | Status: DC

## 2021-10-08 MED ORDER — DIAZEPAM 5 MG PO TABS: 5.0000 mg | ORAL_TABLET | Freq: Every day | ORAL | 0 refills | Status: DC | PRN

## 2021-10-08 NOTE — Telephone Encounter (Signed)
Requested Prescriptions  Pending Prescriptions Disp Refills   montelukast (SINGULAIR) 10 MG tablet [Pharmacy Med Name: MONTELUKAST SOD 10 MG TABLET] 90 tablet 1    Sig: TAKE 1 TABLET BY MOUTH EVERYDAY AT BEDTIME     Pulmonology:  Leukotriene Inhibitors Passed - 10/08/2021  1:49 AM      Passed - Valid encounter within last 12 months    Recent Outpatient Visits          6 months ago Basal cell carcinoma, leg, right   [facility] [name], MD   8 months ago Blood pressure check   [facility] [name], MD   1 year ago Thoracic aortic aneurysm without rupture [facility])   [facility] [name], MD   1 year ago Thoracic aortic aneurysm without rupture [facility])   [facility] [name], MD   1 year ago Centrilobular emphysema [facility])   [facility] [name], MD      Future Appointments            Today [name], MD [facility], [place]   In 6 days Grayland Ormond, Kathlene November, MD [place] [facility] [place]   In 3 weeks [name], MD [facility], [facility]

## 2021-10-12 ENCOUNTER — Telehealth: Payer: Self-pay | Admitting: Oncology

## 2021-10-12 DIAGNOSIS — J432 Centrilobular emphysema: Secondary | ICD-10-CM | POA: Diagnosis not present

## 2021-10-12 DIAGNOSIS — R768 Other specified abnormal immunological findings in serum: Secondary | ICD-10-CM | POA: Diagnosis not present

## 2021-10-12 DIAGNOSIS — J45909 Unspecified asthma, uncomplicated: Secondary | ICD-10-CM | POA: Diagnosis not present

## 2021-10-12 DIAGNOSIS — C44712 Basal cell carcinoma of skin of right lower limb, including hip: Secondary | ICD-10-CM | POA: Diagnosis not present

## 2021-10-12 DIAGNOSIS — I10 Essential (primary) hypertension: Secondary | ICD-10-CM | POA: Diagnosis not present

## 2021-10-12 DIAGNOSIS — Z87891 Personal history of nicotine dependence: Secondary | ICD-10-CM | POA: Diagnosis not present

## 2021-10-12 DIAGNOSIS — Z483 Aftercare following surgery for neoplasm: Secondary | ICD-10-CM | POA: Diagnosis not present

## 2021-10-12 DIAGNOSIS — I700 Atherosclerosis of aorta: Secondary | ICD-10-CM | POA: Diagnosis not present

## 2021-10-12 DIAGNOSIS — M810 Age-related osteoporosis without current pathological fracture: Secondary | ICD-10-CM | POA: Diagnosis not present

## 2021-10-12 DIAGNOSIS — Z85828 Personal history of other malignant neoplasm of skin: Secondary | ICD-10-CM | POA: Diagnosis not present

## 2021-10-12 DIAGNOSIS — F419 Anxiety disorder, unspecified: Secondary | ICD-10-CM | POA: Diagnosis not present

## 2021-10-12 DIAGNOSIS — I712 Thoracic aortic aneurysm, without rupture, unspecified: Secondary | ICD-10-CM | POA: Diagnosis not present

## 2021-10-12 DIAGNOSIS — E785 Hyperlipidemia, unspecified: Secondary | ICD-10-CM | POA: Diagnosis not present

## 2021-10-12 DIAGNOSIS — Z9181 History of falling: Secondary | ICD-10-CM | POA: Diagnosis not present

## 2021-10-12 DIAGNOSIS — Z7951 Long term (current) use of inhaled steroids: Secondary | ICD-10-CM | POA: Diagnosis not present

## 2021-10-12 DIAGNOSIS — Z4801 Encounter for change or removal of surgical wound dressing: Secondary | ICD-10-CM | POA: Diagnosis not present

## 2021-10-12 NOTE — Telephone Encounter (Signed)
Called patient to reschedule her 2/9 Mychart visit w\Dr. Candie Echevaria. He will be out of office that afternoon and will speak with her the following week

## 2021-10-14 ENCOUNTER — Inpatient Hospital Stay: Payer: Medicare HMO | Admitting: Oncology

## 2021-10-15 DIAGNOSIS — Z9181 History of falling: Secondary | ICD-10-CM | POA: Diagnosis not present

## 2021-10-15 DIAGNOSIS — Z85828 Personal history of other malignant neoplasm of skin: Secondary | ICD-10-CM | POA: Diagnosis not present

## 2021-10-15 DIAGNOSIS — F419 Anxiety disorder, unspecified: Secondary | ICD-10-CM | POA: Diagnosis not present

## 2021-10-15 DIAGNOSIS — I712 Thoracic aortic aneurysm, without rupture, unspecified: Secondary | ICD-10-CM | POA: Diagnosis not present

## 2021-10-15 DIAGNOSIS — J432 Centrilobular emphysema: Secondary | ICD-10-CM | POA: Diagnosis not present

## 2021-10-15 DIAGNOSIS — Z483 Aftercare following surgery for neoplasm: Secondary | ICD-10-CM | POA: Diagnosis not present

## 2021-10-15 DIAGNOSIS — C44712 Basal cell carcinoma of skin of right lower limb, including hip: Secondary | ICD-10-CM | POA: Diagnosis not present

## 2021-10-15 DIAGNOSIS — R768 Other specified abnormal immunological findings in serum: Secondary | ICD-10-CM | POA: Diagnosis not present

## 2021-10-15 DIAGNOSIS — Z7951 Long term (current) use of inhaled steroids: Secondary | ICD-10-CM | POA: Diagnosis not present

## 2021-10-15 DIAGNOSIS — Z87891 Personal history of nicotine dependence: Secondary | ICD-10-CM | POA: Diagnosis not present

## 2021-10-15 DIAGNOSIS — I700 Atherosclerosis of aorta: Secondary | ICD-10-CM | POA: Diagnosis not present

## 2021-10-15 DIAGNOSIS — I10 Essential (primary) hypertension: Secondary | ICD-10-CM | POA: Diagnosis not present

## 2021-10-15 DIAGNOSIS — Z4801 Encounter for change or removal of surgical wound dressing: Secondary | ICD-10-CM | POA: Diagnosis not present

## 2021-10-15 DIAGNOSIS — J45909 Unspecified asthma, uncomplicated: Secondary | ICD-10-CM | POA: Diagnosis not present

## 2021-10-15 DIAGNOSIS — M810 Age-related osteoporosis without current pathological fracture: Secondary | ICD-10-CM | POA: Diagnosis not present

## 2021-10-15 DIAGNOSIS — E785 Hyperlipidemia, unspecified: Secondary | ICD-10-CM | POA: Diagnosis not present

## 2021-10-19 DIAGNOSIS — Z4801 Encounter for change or removal of surgical wound dressing: Secondary | ICD-10-CM | POA: Diagnosis not present

## 2021-10-19 DIAGNOSIS — Z483 Aftercare following surgery for neoplasm: Secondary | ICD-10-CM | POA: Diagnosis not present

## 2021-10-19 DIAGNOSIS — F419 Anxiety disorder, unspecified: Secondary | ICD-10-CM | POA: Diagnosis not present

## 2021-10-19 DIAGNOSIS — Z9181 History of falling: Secondary | ICD-10-CM | POA: Diagnosis not present

## 2021-10-19 DIAGNOSIS — Z85828 Personal history of other malignant neoplasm of skin: Secondary | ICD-10-CM | POA: Diagnosis not present

## 2021-10-19 DIAGNOSIS — E785 Hyperlipidemia, unspecified: Secondary | ICD-10-CM | POA: Diagnosis not present

## 2021-10-19 DIAGNOSIS — I700 Atherosclerosis of aorta: Secondary | ICD-10-CM | POA: Diagnosis not present

## 2021-10-19 DIAGNOSIS — I10 Essential (primary) hypertension: Secondary | ICD-10-CM | POA: Diagnosis not present

## 2021-10-19 DIAGNOSIS — J45909 Unspecified asthma, uncomplicated: Secondary | ICD-10-CM | POA: Diagnosis not present

## 2021-10-19 DIAGNOSIS — J432 Centrilobular emphysema: Secondary | ICD-10-CM | POA: Diagnosis not present

## 2021-10-19 DIAGNOSIS — M810 Age-related osteoporosis without current pathological fracture: Secondary | ICD-10-CM | POA: Diagnosis not present

## 2021-10-19 DIAGNOSIS — I712 Thoracic aortic aneurysm, without rupture, unspecified: Secondary | ICD-10-CM | POA: Diagnosis not present

## 2021-10-19 DIAGNOSIS — Z7951 Long term (current) use of inhaled steroids: Secondary | ICD-10-CM | POA: Diagnosis not present

## 2021-10-19 DIAGNOSIS — Z87891 Personal history of nicotine dependence: Secondary | ICD-10-CM | POA: Diagnosis not present

## 2021-10-19 DIAGNOSIS — C44712 Basal cell carcinoma of skin of right lower limb, including hip: Secondary | ICD-10-CM | POA: Diagnosis not present

## 2021-10-19 DIAGNOSIS — R768 Other specified abnormal immunological findings in serum: Secondary | ICD-10-CM | POA: Diagnosis not present

## 2021-10-25 NOTE — Progress Notes (Signed)
[place]  [phone] [phone]  ID: Patricia Galloway OB: 1951/01/06  MR#: [id number]  GUR#:[id number]  Patient Care Team: [name], MD as PCP - General (Family Medicine) Lloyd Huger, MD as Consulting Physician (Oncology) Dillingham, Loel Lofty, DO as Attending Physician (Plastic Surgery) Germaine Pomfret, [facility] as Pharmacist (Pharmacist) Vern Claude, [place] as Social Worker  I connected with Patricia Galloway on 10/27/21 at  3:00 PM EST by video enabled telemedicine visit and verified that I am speaking with the correct person using two identifiers.   I discussed the limitations, risks, security and privacy concerns of performing an evaluation and management service by telemedicine and the availability of in-person appointments. I also discussed with the patient that there may be a patient responsible charge related to this service. The patient expressed understanding and agreed to proceed.   Other persons participating in the visit and their role in the encounter: Patient, MD.  Patients location: Home. Providers location: Clinic.  CHIEF COMPLAINT: Recurrent basal cell carcinoma of the right leg.  INTERVAL HISTORY:  Patient agreed to a video assisted telemedicine visit for routine evaluation.  She underwent resection at [facility] in November 2022 and had to have a repeat excision on August 23, 2021 secondary to positive margins.  Final pathology revealed negative margins.  She has significantly improved and is nearly back to her baseline.  She is more mobile and her pain well controlled. Her wound is healing well by secondary intention. She has no neurologic complaints. She denies any recent fevers or illnesses.  She has a good appetite and denies weight loss.  She denies any chest pain, shortness of breath, cough, or hemoptysis.  She denies any nausea, vomiting, constipation, or diarrhea.  She has no melena or hematochezia.  She has no  urinary complaints.  Patient offers no further specific complains today.  REVIEW OF SYSTEMS:   Review of Systems  Constitutional:  Negative for fever, malaise/fatigue and weight loss.  Respiratory: Negative.  Negative for cough and shortness of breath.   Cardiovascular: Negative.  Negative for chest pain and leg swelling.  Gastrointestinal: Negative.  Negative for abdominal pain, nausea and vomiting.  Genitourinary: Negative.  Negative for dysuria.  Musculoskeletal: Negative.  Negative for myalgias.  Skin: Negative.  Negative for rash.  Neurological: Negative.  Negative for sensory change, focal weakness and weakness.  Psychiatric/Behavioral: Negative.  The patient is not nervous/anxious.    As per HPI. Otherwise, a complete review of systems is negative.  PAST MEDICAL HISTORY: Past Medical History:  Diagnosis Date   Anemia    has had iron infusion   Anxiety    complicated grief   Aortic aneurysm ([place])    found on CT in December, 2017   Asthma    as child , but no episodes in over 30 years   Basal cell carcinoma    Cancer ([place]) 08/2016   basal cell carcinoma - right leg   History of transfusion of packed red blood cells    Personal history of chemotherapy 2018   basal cell     PAST SURGICAL HISTORY: Past Surgical History:  Procedure Laterality Date   BASAL CELL CARCINOMA EXCISION Right 03/29/2017   Procedure: EXCISION OF RIGHT LEG BASEL CELL CARCINOMA;  Surgeon: [name], DO;  Location: [facility];  Service: Plastics;  Laterality: Right;   COLON SURGERY  2002   colostomy for 10 months after a perfurated sigmoid    COLOSTOMY REVERSAL  2003  EMBOLIZATION Right 10/12/2016   Procedure: Embolization;  Surgeon: [name], MD;  Location: [facility];  Service: Cardiovascular;  Laterality: Right;   etopic  4081,4481   INCISION AND DRAINAGE OF WOUND Right 03/30/2017   Procedure: IRRIGATION AND DEBRIDEMENT WOUND; VAC PLACEMENT;  Surgeon: [name], DO;  Location: [facility];  Service: Plastics;  Laterality: Right;   TONSILLECTOMY      FAMILY HISTORY: Family History  Problem Relation Age of Onset   Dementia Mother    Aortic stenosis Mother    Osteoporosis Mother    Dementia Father    Diabetes Father    Hyperlipidemia Father    Hypertension Father    Aneurysm Sister    Hyperlipidemia Sister    Hypertension Sister    Diabetes Sister    Breast cancer Neg Hx     ADVANCED DIRECTIVES (Y/N):  N  HEALTH MAINTENANCE: Social History   Tobacco Use   Smoking status: Former    Packs/day: 0.50    Years: 25.00    Pack years: 12.50    Types: Cigarettes    Start date: 09/05/1972    Quit date: 08/03/1998    Years since quitting: 23.2   Smokeless tobacco: Never   Tobacco comments:    smoking cessation materials not required  Vaping Use   Vaping Use: Never used  Substance Use Topics   Alcohol use: No    Alcohol/week: 0.0 standard drinks   Drug use: No     Colonoscopy:  PAP:  Bone density:  Lipid panel:  Allergies  Allergen Reactions   Penicillins Anaphylaxis    Has patient had a PCN reaction causing immediate rash, facial/tongue/throat swelling, SOB or lightheadedness with hypotension: Yes Has patient had a PCN reaction causing severe rash involving mucus membranes or skin necrosis: No Has patient had a PCN reaction that required hospitalization: No Has patient had a PCN reaction occurring within the last 10 years: No If all of the above answers are "NO", then may proceed with Cephalosporin use.    Codeine Nausea And Vomiting   Latex Itching   Sulfa Antibiotics Other (See Comments)    TOPICAL-SULFA CAUSED BLISTERS   Tape Other (See Comments)    Reaction:  Blisters and burning  Pt states that paper tape is okay.     Xopenex [Levalbuterol Hcl] Anxiety    Current Outpatient Medications  Medication Sig Dispense Refill   albuterol (VENTOLIN HFA) 108 (90 Base) MCG/ACT inhaler Inhale 2 puffs into the lungs every 6 (six)  hours as needed for wheezing or shortness of breath. Patient requests generic Ventolin 18 g 0   amLODipine (NORVASC) 5 MG tablet Take 1 tablet (5 mg total) by mouth daily. 90 tablet 1   benzonatate (TESSALON) 100 MG capsule Take 1-2 capsules (100-200 mg total) by mouth 2 (two) times daily as needed. 40 capsule 0   Cholecalciferol (VITAMIN D3) 250 MCG (10000 UT) TABS Take 1 tablet by mouth 2 (two) times a week.     diazepam (VALIUM) 5 MG tablet Take 1 tablet (5 mg total) by mouth daily as needed for anxiety. 30 minutes before doctor's visit 30 tablet 0   fluticasone furoate-vilanterol (BREO ELLIPTA) 200-25 MCG/ACT AEPB Inhale 1 puff into the lungs daily. 180 each 1   montelukast (SINGULAIR) 10 MG tablet TAKE 1 TABLET BY MOUTH EVERYDAY AT BEDTIME 90 tablet 1   Multiple Vitamin (MULTIVITAMIN) capsule Take 2 capsules by mouth daily.     OVER THE COUNTER  MEDICATION Take 1 tablet by mouth 2 (two) times daily. Nutri-Calm     OVER THE COUNTER MEDICATION Life extension bone strength / life extension skin restore ceramide / life extension super bio curcumin     valsartan (DIOVAN) 160 MG tablet Take 1 tablet (160 mg total) by mouth daily. 90 tablet 1   No current facility-administered medications for this visit.    OBJECTIVE: There were no vitals filed for this visit.    There is no height or weight on file to calculate BMI.    ECOG FS:0 - Asymptomatic   General: Well-developed, well-nourished, no acute distress. HEENT: Normocephalic. Neuro: Alert, answering all questions appropriately. Cranial nerves grossly intact. Psych: Normal affect.   LAB RESULTS:  Lab Results  Component Value Date   NA 137 02/13/2020   K 4.1 02/13/2020   CL 99 02/13/2020   CO2 29 02/13/2020   GLUCOSE 94 02/13/2020   BUN 14 02/13/2020   CREATININE 0.60 11/06/2020   CALCIUM 9.9 02/13/2020   PROT 7.2 02/13/2020   ALBUMIN 4.4 02/11/2019   AST 19 02/13/2020   ALT 14 02/13/2020   ALKPHOS 62 02/11/2019   BILITOT 0.7  02/13/2020   GFRNONAA 81 02/13/2020   GFRAA 93 02/13/2020    Lab Results  Component Value Date   WBC 8.2 02/13/2020   NEUTROABS 4,600 02/13/2020   HGB 14.8 02/13/2020   HCT 44.1 02/13/2020   MCV 90.0 02/13/2020   PLT 268 02/13/2020   Lab Results  Component Value Date   IRON 71 01/09/2017   TIBC 225 (L) 01/09/2017   IRONPCTSAT 32 (H) 01/09/2017   Lab Results  Component Value Date   FERRITIN 58 01/09/2017    STUDIES: No results found.  ONCOLOGY HISTORY:  Patient stated this lesion grew for 9 years and she did not seek medical attention. Diagnosis confirmed by biopsy from surgery.  Patient underwent embolization as well as to neoadjuvant treatment with vismodegib (Erivedge)150 mg daily, a hedgehog inhibitor. Patient underwent complete resection of residual malignancy on March 29, 2017.  Previously, adjuvant chemotherapy and XRT was discussed, but given widely clear margins on surgical resections this was determined not to be necessary.  She underwent reexcision for recurrent disease at [facility] on August 02, 2021.  Patient noted to have positive margins and went back to surgery on August 23, 2021 to achieve negative margins.  ASSESSMENT: Recurrent basal cell carcinoma of the right leg.  PLAN:    1. Recurrent basal cell carcinoma of the right leg: PET scan results from December 18, 2020 reviewed independently with hypermetabolic bandlike region with SUV of 10.5.  Biopsy confirmed recurrent malignancy.  Case discussed with patient's primary surgeon, Dr. Audelia Hives and was determined that patient should be evaluated by orthopedic oncologist, Dr. Junie Spencer.  She finally underwent surgery in November and December as above.  Patient now has negative margins with no evidence of disease.  Continue follow-up with surgery and home health as scheduled.  Patient will have a video assisted telemedicine visit in 3 months at which time we will discuss scheduling a re-staging PET scan.  2. Iron  deficiency anemia: Resolved.  Patient last received Feraheme on Jan 28, 2017.     3. Hypertension: Chronic and unchanged.   4.  Pulmonary nodules: Likely benign.  CT scan results from November 06, 2020 reviewed independently and report as above with stable nodules.  These are not hypermetabolic based on her most recent PET scan as above.  No further imaging  is necessary.  I provided 20 minutes of face-to-face video visit time during this encounter which included chart review, counseling, and coordination of care as documented above.   Patient expressed understanding and was in agreement with this plan. She also understands that She can call clinic at any time with any questions, concerns, or complaints.    Cancer Staging  Basal cell carcinoma, leg, right Staging form: Melanoma of the Skin, AJCC 7th Edition - Clinical: No stage assigned - Unsigned   Lloyd Huger, MD   10/27/2021 5:20 PM

## 2021-10-26 ENCOUNTER — Encounter: Payer: Self-pay | Admitting: Oncology

## 2021-10-26 ENCOUNTER — Inpatient Hospital Stay: Payer: Medicare HMO | Attending: Oncology | Admitting: Oncology

## 2021-10-26 DIAGNOSIS — I712 Thoracic aortic aneurysm, without rupture, unspecified: Secondary | ICD-10-CM | POA: Diagnosis not present

## 2021-10-26 DIAGNOSIS — J432 Centrilobular emphysema: Secondary | ICD-10-CM | POA: Diagnosis not present

## 2021-10-26 DIAGNOSIS — R768 Other specified abnormal immunological findings in serum: Secondary | ICD-10-CM | POA: Diagnosis not present

## 2021-10-26 DIAGNOSIS — Z9181 History of falling: Secondary | ICD-10-CM | POA: Diagnosis not present

## 2021-10-26 DIAGNOSIS — I10 Essential (primary) hypertension: Secondary | ICD-10-CM | POA: Diagnosis not present

## 2021-10-26 DIAGNOSIS — C44712 Basal cell carcinoma of skin of right lower limb, including hip: Secondary | ICD-10-CM | POA: Diagnosis not present

## 2021-10-26 DIAGNOSIS — E785 Hyperlipidemia, unspecified: Secondary | ICD-10-CM | POA: Diagnosis not present

## 2021-10-26 DIAGNOSIS — I700 Atherosclerosis of aorta: Secondary | ICD-10-CM | POA: Diagnosis not present

## 2021-10-26 DIAGNOSIS — J45909 Unspecified asthma, uncomplicated: Secondary | ICD-10-CM | POA: Diagnosis not present

## 2021-10-26 DIAGNOSIS — Z4801 Encounter for change or removal of surgical wound dressing: Secondary | ICD-10-CM | POA: Diagnosis not present

## 2021-10-26 DIAGNOSIS — Z7951 Long term (current) use of inhaled steroids: Secondary | ICD-10-CM | POA: Diagnosis not present

## 2021-10-26 DIAGNOSIS — Z483 Aftercare following surgery for neoplasm: Secondary | ICD-10-CM | POA: Diagnosis not present

## 2021-10-26 DIAGNOSIS — F419 Anxiety disorder, unspecified: Secondary | ICD-10-CM | POA: Diagnosis not present

## 2021-10-26 DIAGNOSIS — Z87891 Personal history of nicotine dependence: Secondary | ICD-10-CM | POA: Diagnosis not present

## 2021-10-26 DIAGNOSIS — Z85828 Personal history of other malignant neoplasm of skin: Secondary | ICD-10-CM | POA: Diagnosis not present

## 2021-10-26 DIAGNOSIS — M810 Age-related osteoporosis without current pathological fracture: Secondary | ICD-10-CM | POA: Diagnosis not present

## 2021-10-27 ENCOUNTER — Encounter: Payer: Self-pay | Admitting: Oncology

## 2021-10-28 ENCOUNTER — Ambulatory Visit (INDEPENDENT_AMBULATORY_CARE_PROVIDER_SITE_OTHER): Payer: Medicare HMO

## 2021-10-28 DIAGNOSIS — Z1231 Encounter for screening mammogram for malignant neoplasm of breast: Secondary | ICD-10-CM | POA: Diagnosis not present

## 2021-10-28 DIAGNOSIS — Z0000 Encounter for general adult medical examination without abnormal findings: Secondary | ICD-10-CM

## 2021-10-28 NOTE — Patient Instructions (Signed)
Patricia Galloway , Thank you for taking time to come for your Medicare Wellness Visit. I appreciate your ongoing commitment to your health goals. Please review the following plan we discussed and let me know if I can assist you in the future.   Screening recommendations/referrals: Colonoscopy: pt declined Mammogram: done 10/21/19. Please call [phone] to schedule your mammogram.  Bone Density: done 10/21/19 Recommended yearly ophthalmology/optometry visit for glaucoma screening and checkup Recommended yearly dental visit for hygiene and checkup  Vaccinations: Influenza vaccine: declined Pneumococcal vaccine: declined Tdap vaccine: done 08/08/16 Shingles vaccine: declined   Covid-19:declined  Conditions/risks identified: Recommend increasing physical activity as tolerated  Next appointment: Follow up in one year for your annual wellness visit    Preventive Care 54 Years and Older, Female Preventive care refers to lifestyle choices and visits with your health care provider that can promote health and wellness. What does preventive care include? A yearly physical exam. This is also called an annual well check. Dental exams once or twice a year. Routine eye exams. Ask your health care provider how often you should have your eyes checked. Personal lifestyle choices, including: Daily care of your teeth and gums. Regular physical activity. Eating a healthy diet. Avoiding tobacco and drug use. Limiting alcohol use. Practicing safe sex. Taking low-dose aspirin every day. Taking vitamin and mineral supplements as recommended by your health care provider. What happens during an annual well check? The services and screenings done by your health care provider during your annual well check will depend on your age, overall health, lifestyle risk factors, and family history of disease. Counseling  Your health care provider may ask you questions about your: Alcohol use. Tobacco use. Drug  use. Emotional well-being. Home and relationship well-being. Sexual activity. Eating habits. History of falls. Memory and ability to understand (cognition). Work and work Statistician. Reproductive health. Screening  You may have the following tests or measurements: Height, weight, and BMI. Blood pressure. Lipid and cholesterol levels. These may be checked every 5 years, or more frequently if you are over 25 years old. Skin check. Lung cancer screening. You may have this screening every year starting at age 83 if you have a 30-pack-year history of smoking and currently smoke or have quit within the past 15 years. Fecal occult blood test (FOBT) of the stool. You may have this test every year starting at age 31. Flexible sigmoidoscopy or colonoscopy. You may have a sigmoidoscopy every 5 years or a colonoscopy every 10 years starting at age 52. Hepatitis C blood test. Hepatitis B blood test. Sexually transmitted disease (STD) testing. Diabetes screening. This is done by checking your blood sugar (glucose) after you have not eaten for a while (fasting). You may have this done every 1-3 years. Bone density scan. This is done to screen for osteoporosis. You may have this done starting at age 16. Mammogram. This may be done every 1-2 years. Talk to your health care provider about how often you should have regular mammograms. Talk with your health care provider about your test results, treatment options, and if necessary, the need for more tests. Vaccines  Your health care provider may recommend certain vaccines, such as: Influenza vaccine. This is recommended every year. Tetanus, diphtheria, and acellular pertussis (Tdap, Td) vaccine. You may need a Td booster every 10 years. Zoster vaccine. You may need this after age 56. Pneumococcal 13-valent conjugate (PCV13) vaccine. One dose is recommended after age 31. Pneumococcal polysaccharide (PPSV23) vaccine. One dose is recommended after age  66. Talk to your health care provider about which screenings and vaccines you need and how often you need them. This information is not intended to replace advice given to you by your health care provider. Make sure you discuss any questions you have with your health care provider. Document Released: 09/18/2015 Document Revised: 05/11/2016 Document Reviewed: 06/23/2015 Elsevier Interactive Patient Education  2017 [place] Prevention in the Home Falls can cause injuries. They can happen to people of all ages. There are many things you can do to make your home safe and to help prevent falls. What can I do on the outside of my home? Regularly fix the edges of walkways and driveways and fix any cracks. Remove anything that might make you trip as you walk through a door, such as a raised step or threshold. Trim any bushes or trees on the path to your home. Use bright outdoor lighting. Clear any walking paths of anything that might make someone trip, such as rocks or tools. Regularly check to see if handrails are loose or broken. Make sure that both sides of any steps have handrails. Any raised decks and porches should have guardrails on the edges. Have any leaves, snow, or ice cleared regularly. Use sand or salt on walking paths during winter. Clean up any spills in your garage right away. This includes oil or grease spills. What can I do in the bathroom? Use night lights. Install grab bars by the toilet and in the tub and shower. Do not use towel bars as grab bars. Use non-skid mats or decals in the tub or shower. If you need to sit down in the shower, use a plastic, non-slip stool. Keep the floor dry. Clean up any water that spills on the floor as soon as it happens. Remove soap buildup in the tub or shower regularly. Attach bath mats securely with double-sided non-slip rug tape. Do not have throw rugs and other things on the floor that can make you trip. What can I do in the  bedroom? Use night lights. Make sure that you have a light by your bed that is easy to reach. Do not use any sheets or blankets that are too big for your bed. They should not hang down onto the floor. Have a firm chair that has side arms. You can use this for support while you get dressed. Do not have throw rugs and other things on the floor that can make you trip. What can I do in the kitchen? Clean up any spills right away. Avoid walking on wet floors. Keep items that you use a lot in easy-to-reach places. If you need to reach something above you, use a strong step stool that has a grab bar. Keep electrical cords out of the way. Do not use floor polish or wax that makes floors slippery. If you must use wax, use non-skid floor wax. Do not have throw rugs and other things on the floor that can make you trip. What can I do with my stairs? Do not leave any items on the stairs. Make sure that there are handrails on both sides of the stairs and use them. Fix handrails that are broken or loose. Make sure that handrails are as long as the stairways. Check any carpeting to make sure that it is firmly attached to the stairs. Fix any carpet that is loose or worn. Avoid having throw rugs at the top or bottom of the stairs. If you do have throw  rugs, attach them to the floor with carpet tape. Make sure that you have a light switch at the top of the stairs and the bottom of the stairs. If you do not have them, ask someone to add them for you. What else can I do to help prevent falls? Wear shoes that: Do not have high heels. Have rubber bottoms. Are comfortable and fit you well. Are closed at the toe. Do not wear sandals. If you use a stepladder: Make sure that it is fully opened. Do not climb a closed stepladder. Make sure that both sides of the stepladder are locked into place. Ask someone to hold it for you, if possible. Clearly mark and make sure that you can see: Any grab bars or  handrails. First and last steps. Where the edge of each step is. Use tools that help you move around (mobility aids) if they are needed. These include: Canes. Walkers. Scooters. Crutches. Turn on the lights when you go into a dark area. Replace any light bulbs as soon as they burn out. Set up your furniture so you have a clear path. Avoid moving your furniture around. If any of your floors are uneven, fix them. If there are any pets around you, be aware of where they are. Review your medicines with your doctor. Some medicines can make you feel dizzy. This can increase your chance of falling. Ask your doctor what other things that you can do to help prevent falls. This information is not intended to replace advice given to you by your health care provider. Make sure you discuss any questions you have with your health care provider. Document Released: 06/18/2009 Document Revised: 01/28/2016 Document Reviewed: 09/26/2014 Elsevier Interactive Patient Education  2017 [employer].

## 2021-10-28 NOTE — Progress Notes (Signed)
Subjective:   Patricia Galloway is a 71 y.o. female who presents for Medicare Annual (Subsequent) preventive examination.  Virtual Visit via Telephone Note  I connected with  Patricia Galloway on 10/28/21 at 10:00 AM EST by telephone and verified that I am speaking with the correct person using two identifiers.  Location: Patient: home Provider: [place] Persons participating in the virtual visit: [place]   I discussed the limitations, risks, security and privacy concerns of performing an evaluation and management service by telephone and the availability of in person appointments. The patient expressed understanding and agreed to proceed.  Interactive audio and video telecommunications were attempted between this nurse and patient, however failed, due to patient having technical difficulties OR patient did not have access to video capability.  We continued and completed visit with audio only.  Some vital signs may be absent or patient reported.   [name], LPN   Review of Systems     Cardiac Risk Factors include: advanced age (>54men, >37 women);hypertension     Objective:    There were no vitals filed for this visit. There is no height or weight on file to calculate BMI.  Advanced Directives 10/28/2021 10/26/2021 09/02/2021 12/22/2020 11/10/2020 08/18/2020 11/07/2019  Does Patient Have a Medical Advance Directive? Yes Yes Yes Yes Yes Yes Yes  Type of Paramedic of [place];Living will [place];Living will [place];Living will [place];Living will - [place];Living will Living will;Healthcare Power of Attorney  Does patient want to make changes to medical advance directive? - - Yes (ED - Information included in AVS) - - - -  Copy of [place] in Chart? Yes - validated most recent copy scanned in chart (See row information) - - - - Yes - validated  most recent copy scanned in chart (See row information) -  Would patient like information on creating a medical advance directive? - - - - - - -    Current Medications (verified) Outpatient Encounter Medications as of 10/28/2021  Medication Sig   albuterol (VENTOLIN HFA) 108 (90 Base) MCG/ACT inhaler Inhale 2 puffs into the lungs every 6 (six) hours as needed for wheezing or shortness of breath. Patient requests generic Ventolin   amLODipine (NORVASC) 5 MG tablet Take 1 tablet (5 mg total) by mouth daily.   calcium carbonate (OS-CAL - DOSED IN MG OF ELEMENTAL CALCIUM) 1250 (500 Ca) MG tablet Take by mouth. Take 1,600 mg of elemental by mouth once daily   Cholecalciferol (VITAMIN D3) 250 MCG (10000 UT) TABS Take 1 tablet by mouth 2 (two) times a week.   diazepam (VALIUM) 5 MG tablet Take 1 tablet (5 mg total) by mouth daily as needed for anxiety. 30 minutes before doctor's visit   fluticasone furoate-vilanterol (BREO ELLIPTA) 200-25 MCG/ACT AEPB Inhale 1 puff into the lungs daily.   magnesium 30 MG tablet Take 30 mg by mouth daily.   montelukast (SINGULAIR) 10 MG tablet TAKE 1 TABLET BY MOUTH EVERYDAY AT BEDTIME   OVER THE COUNTER MEDICATION Take 1 tablet by mouth 2 (two) times daily. Nutri-Calm   benzonatate (TESSALON) 100 MG capsule Take 1-2 capsules (100-200 mg total) by mouth 2 (two) times daily as needed. (Patient not taking: Reported on 10/28/2021)   Multiple Vitamin (MULTIVITAMIN) capsule Take 2 capsules by mouth daily. (Patient not taking: Reported on 10/28/2021)   OVER THE COUNTER MEDICATION Life extension bone strength / life extension skin restore  ceramide / life extension super bio curcumin (Patient not taking: Reported on 10/28/2021)   valsartan (DIOVAN) 160 MG tablet Take 1 tablet (160 mg total) by mouth daily. (Patient not taking: Reported on 10/28/2021)   No facility-administered encounter medications on file as of 10/28/2021.    Allergies (verified) Penicillins, Codeine, Latex,  Sulfa antibiotics, Tape, and Xopenex [levalbuterol hcl]   History: Past Medical History:  Diagnosis Date   Anemia    has had iron infusion   Anxiety    complicated grief   Aortic aneurysm ([place])    found on CT in December, 2017   Asthma    as child , but no episodes in over 30 years   Basal cell carcinoma    Cancer ([place]) 08/2016   basal cell carcinoma - right leg   History of transfusion of packed red blood cells    Personal history of chemotherapy 2018   basal cell    Past Surgical History:  Procedure Laterality Date   BASAL CELL CARCINOMA EXCISION Right 03/29/2017   Procedure: EXCISION OF RIGHT LEG BASEL CELL CARCINOMA;  Surgeon: [name], DO;  Location: [facility];  Service: Plastics;  Laterality: Right;   COLON SURGERY  2002   colostomy for 10 months after a perfurated sigmoid    COLOSTOMY REVERSAL  2003   EMBOLIZATION Right 10/12/2016   Procedure: Embolization;  Surgeon: [name], MD;  Location: [facility];  Service: Cardiovascular;  Laterality: Right;   etopic  7741,2878   INCISION AND DRAINAGE OF WOUND Right 03/30/2017   Procedure: IRRIGATION AND DEBRIDEMENT WOUND; VAC PLACEMENT;  Surgeon: [name], DO;  Location: [facility];  Service: Plastics;  Laterality: Right;   TONSILLECTOMY     Family History  Problem Relation Age of Onset   Dementia Mother    Aortic stenosis Mother    Osteoporosis Mother    Dementia Father    Diabetes Father    Hyperlipidemia Father    Hypertension Father    Aneurysm Sister    Hyperlipidemia Sister    Hypertension Sister    Diabetes Sister    Breast cancer Neg Hx    Social History   Socioeconomic History   Marital status: Married    Spouse name: John   Number of children: 0   Years of education: Not on file   Highest education level: Bachelor's degree (e.g., BA, AB, BS)  Occupational History   Occupation: Retired  Tobacco Use   Smoking status: Former    Packs/day: 0.50    Years: 25.00    Pack  years: 12.50    Types: Cigarettes    Start date: 09/05/1972    Quit date: 08/03/1998    Years since quitting: 23.2   Smokeless tobacco: Never   Tobacco comments:    smoking cessation materials not required  Vaping Use   Vaping Use: Never used  Substance and Sexual Activity   Alcohol use: No    Alcohol/week: 0.0 standard drinks   Drug use: No   Sexual activity: Not Currently    Birth control/protection: Post-menopausal  Other Topics Concern   Not on file  Social History Narrative   Not on file   Social Determinants of Health   Financial Resource Strain: Low Risk    Difficulty of Paying Living Expenses: Not very hard  Food Insecurity: No Food Insecurity   Worried About Running Out of Food in the Last Year: Never true   Arboriculturist in  the Last Year: Never true  Transportation Needs: No Transportation Needs   Lack of Transportation (Medical): No   Lack of Transportation (Non-Medical): No  Physical Activity: Inactive   Days of Exercise per Week: 0 days   Minutes of Exercise per Session: 0 min  Stress: No Stress Concern Present   Feeling of Stress : Not at all  Social Connections: Moderately Isolated   Frequency of Communication with Friends and Family: More than three times a week   Frequency of Social Gatherings with Friends and Family: Never   Attends Religious Services: Never   Marine scientist or Organizations: No   Attends Music therapist: Never   Marital Status: Married    Tobacco Counseling Counseling given: Not Answered Tobacco comments: smoking cessation materials not required   Clinical Intake:  Pre-visit preparation completed: Yes  Pain : No/denies pain     Nutritional Risks: None Diabetes: No  How often do you need to have someone help you when you read instructions, pamphlets, or other written materials from your doctor or pharmacy?: 1 - Never    Interpreter Needed?: No  Information entered by :: [name]  LPN   Activities of Daily Living In your present state of health, do you have any difficulty performing the following activities: 10/28/2021 10/08/2021  Hearing? N N  Vision? N N  Difficulty concentrating or making decisions? N N  Walking or climbing stairs? N N  Dressing or bathing? N N  Doing errands, shopping? N N  Preparing Food and eating ? N -  Using the Toilet? N -  In the past six months, have you accidently leaked urine? N -  Do you have problems with loss of bowel control? N -  Managing your Medications? N -  Managing your Finances? N -  Housekeeping or managing your Housekeeping? N -  Some recent data might be hidden    Patient Care Team: [name], MD as PCP - General (Family Medicine) Lloyd Huger, MD as Consulting Physician (Oncology) [place], Loel Lofty, DO as Attending Physician (Plastic Surgery) Germaine Pomfret, [facility] as Pharmacist (Pharmacist) Vern Claude, LCSW as Social Worker  Indicate any recent Medical Services you may have received from other than [facility] providers in the past year (date may be approximate).     Assessment:   This is a routine wellness examination for [place].  Hearing/Vision screen Hearing Screening - Comments:: Pt denies hearing difficulty Vision Screening - Comments:: Past due for eye exam; plans to establish care with new provider this year  Dietary issues and exercise activities discussed: Current Exercise Habits: The patient does not participate in regular exercise at present, Exercise limited by: Other - see comments (s/p surgery, open wound)   Goals Addressed             This Visit's Progress    DIET - INCREASE WATER INTAKE   On track    Recommend to drink at least 6-8 8oz glasses of water per day.     Increase physical activity       Patient would like to implement a physical activity routine for strength in right leg and "feel alive again"     Manage My Medicine   On track    Timeframe:   Long-Range Goal Priority:  High Start Date:   11/05/2020  Expected End Date:  05/13/2022                     Follow Up Date 07/2021    - call for medicine refill 2 or 3 days before it runs out - keep a list of all the medicines I take; vitamins and herbals too - use a pillbox to sort medicine    Why is this important?   These steps will help you keep on track with your medicines.   Notes:        Depression Screen PHQ 2/9 Scores 10/28/2021 10/08/2021 04/06/2021 03/22/2021 10/06/2020 08/18/2020 04/03/2020  PHQ - 2 Score 0 0 0 0 0 0 0  PHQ- 9 Score 3 0 3 - 0 - -    Fall Risk Fall Risk  10/28/2021 10/08/2021 04/06/2021 10/06/2020 08/18/2020  Falls in the past year? 0 0 0 0 0  Comment - - - - -  Number falls in past yr: 0 0 0 0 0  Injury with Fall? 0 0 0 0 0  Risk for fall due to : No Fall Risks No Fall Risks - - No Fall Risks  Follow up Falls prevention discussed Falls prevention discussed - - Falls prevention discussed    FALL RISK PREVENTION PERTAINING TO THE HOME:  Any stairs in or around the home? Yes  If so, are there any without handrails? No  Home free of loose throw rugs in walkways, pet beds, electrical cords, etc? Yes  Adequate lighting in your home to reduce risk of falls? Yes   ASSISTIVE DEVICES UTILIZED TO PREVENT FALLS:  Life alert? No  Use of a cane, walker or w/c? No  Grab bars in the bathroom? Yes  Shower chair or bench in shower? Yes  Elevated toilet seat or a handicapped toilet? Yes   TIMED UP AND GO:  Was the test performed? No . Telephonic visit.   Cognitive Function: Normal cognitive status assessed by direct observation by this Nurse Health Advisor. No abnormalities found.       6CIT Screen 12/26/2017  What Year? 0 points  What month? 0 points  What time? 0 points  Count back from 20 0 points  Months in reverse 0 points  Repeat phrase 0 points  Total Score 0    Immunizations Immunization History  Administered Date(s)  Administered   Tdap 08/08/2016    TDAP status: Due, Education has been provided regarding the importance of this vaccine. Advised may receive this vaccine at local pharmacy or Health Dept. Aware to provide a copy of the vaccination record if obtained from local pharmacy or Health Dept. Verbalized acceptance and understanding.  Flu Vaccine status: Declined, Education has been provided regarding the importance of this vaccine but patient still declined. Advised may receive this vaccine at local pharmacy or Health Dept. Aware to provide a copy of the vaccination record if obtained from local pharmacy or Health Dept. Verbalized acceptance and understanding.  Pneumococcal vaccine status: Declined,  Education has been provided regarding the importance of this vaccine but patient still declined. Advised may receive this vaccine at local pharmacy or Health Dept. Aware to provide a copy of the vaccination record if obtained from local pharmacy or Health Dept. Verbalized acceptance and understanding.   Covid-19 vaccine status: Declined, Education has been provided regarding the importance of this vaccine but patient still declined. Advised may receive this vaccine at local pharmacy or Health Dept.or vaccine clinic. Aware to provide a copy of the  vaccination record if obtained from local pharmacy or Health Dept. Verbalized acceptance and understanding.  Qualifies for Shingles Vaccine? Yes   Zostavax completed No   Shingrix Completed?: No.    Education has been provided regarding the importance of this vaccine. Patient has been advised to call insurance company to determine out of pocket expense if they have not yet received this vaccine. Advised may also receive vaccine at local pharmacy or Health Dept. Verbalized acceptance and understanding.  Screening Tests Health Maintenance  Topic Date Due   COLONOSCOPY (Pts 45-72yrs Insurance coverage will need to be confirmed)  Never done   MAMMOGRAM  10/20/2021    Zoster Vaccines- Shingrix (1 of 2) 01/25/2022 (Originally 07/27/1970)   Pneumonia Vaccine 8+ Years old (1 - PCV) 10/28/2022 (Originally 07/27/1957)   TETANUS/TDAP  08/08/2026   DEXA SCAN  Completed   Hepatitis C Screening  Completed   HPV VACCINES  Aged Out   INFLUENZA VACCINE  Discontinued   COVID-19 Vaccine  Discontinued    Health Maintenance  Health Maintenance Due  Topic Date Due   COLONOSCOPY (Pts 45-19yrs Insurance coverage will need to be confirmed)  Never done   MAMMOGRAM  10/20/2021    Colorectal cancer screening: pt declines   Mammogram status: Completed 10/21/19. Repeat every year  Bone Density status: Completed 10/21/19. Results reflect: Bone density results: OSTEOPOROSIS. Repeat every 2 years. Pt declines repeat screening at this time  Lung Cancer Screening: (Low Dose CT Chest recommended if Age 85-80 years, 30 pack-year currently smoking OR have quit w/in 15years.) does not qualify.   Additional Screening:  Hepatitis C Screening: does qualify; Completed 12/26/17  Vision Screening: Recommended annual ophthalmology exams for early detection of glaucoma and other disorders of the eye. Is the patient up to date with their annual eye exam?  No  Who is the provider or what is the name of the office in which the patient attends annual eye exams? Not established If pt is not established with a provider, would they like to be referred to a provider to establish care? No .   Dental Screening: Recommended annual dental exams for proper oral hygiene  Community Resource Referral / Chronic Care Management: CRR required this visit?  No   CCM required this visit?  No      Plan:     I have personally reviewed and noted the following in the patients chart:   Medical and social history Use of alcohol, tobacco or illicit drugs  Current medications and supplements including opioid prescriptions.  Functional ability and status Nutritional status Physical  activity Advanced directives List of other physicians Hospitalizations, surgeries, and ER visits in previous 12 months Vitals Screenings to include cognitive, depression, and falls Referrals and appointments  In addition, I have reviewed and discussed with patient certain preventive protocols, quality metrics, and best practice recommendations. A written personalized care plan for preventive services as well as general preventive health recommendations were provided to patient.     [name], LPN   [id number]   Nurse Notes: none

## 2021-10-29 DIAGNOSIS — Z9181 History of falling: Secondary | ICD-10-CM | POA: Diagnosis not present

## 2021-10-29 DIAGNOSIS — Z87891 Personal history of nicotine dependence: Secondary | ICD-10-CM | POA: Diagnosis not present

## 2021-10-29 DIAGNOSIS — F419 Anxiety disorder, unspecified: Secondary | ICD-10-CM | POA: Diagnosis not present

## 2021-10-29 DIAGNOSIS — Z4801 Encounter for change or removal of surgical wound dressing: Secondary | ICD-10-CM | POA: Diagnosis not present

## 2021-10-29 DIAGNOSIS — Z483 Aftercare following surgery for neoplasm: Secondary | ICD-10-CM | POA: Diagnosis not present

## 2021-10-29 DIAGNOSIS — M810 Age-related osteoporosis without current pathological fracture: Secondary | ICD-10-CM | POA: Diagnosis not present

## 2021-10-29 DIAGNOSIS — J45909 Unspecified asthma, uncomplicated: Secondary | ICD-10-CM | POA: Diagnosis not present

## 2021-10-29 DIAGNOSIS — I700 Atherosclerosis of aorta: Secondary | ICD-10-CM | POA: Diagnosis not present

## 2021-10-29 DIAGNOSIS — J432 Centrilobular emphysema: Secondary | ICD-10-CM | POA: Diagnosis not present

## 2021-10-29 DIAGNOSIS — R768 Other specified abnormal immunological findings in serum: Secondary | ICD-10-CM | POA: Diagnosis not present

## 2021-10-29 DIAGNOSIS — Z7951 Long term (current) use of inhaled steroids: Secondary | ICD-10-CM | POA: Diagnosis not present

## 2021-10-29 DIAGNOSIS — I10 Essential (primary) hypertension: Secondary | ICD-10-CM | POA: Diagnosis not present

## 2021-10-29 DIAGNOSIS — C44712 Basal cell carcinoma of skin of right lower limb, including hip: Secondary | ICD-10-CM | POA: Diagnosis not present

## 2021-10-29 DIAGNOSIS — E785 Hyperlipidemia, unspecified: Secondary | ICD-10-CM | POA: Diagnosis not present

## 2021-10-29 DIAGNOSIS — I712 Thoracic aortic aneurysm, without rupture, unspecified: Secondary | ICD-10-CM | POA: Diagnosis not present

## 2021-10-29 DIAGNOSIS — Z85828 Personal history of other malignant neoplasm of skin: Secondary | ICD-10-CM | POA: Diagnosis not present

## 2021-11-02 DIAGNOSIS — E785 Hyperlipidemia, unspecified: Secondary | ICD-10-CM | POA: Diagnosis not present

## 2021-11-02 DIAGNOSIS — Z4801 Encounter for change or removal of surgical wound dressing: Secondary | ICD-10-CM | POA: Diagnosis not present

## 2021-11-02 DIAGNOSIS — J432 Centrilobular emphysema: Secondary | ICD-10-CM | POA: Diagnosis not present

## 2021-11-02 DIAGNOSIS — M810 Age-related osteoporosis without current pathological fracture: Secondary | ICD-10-CM | POA: Diagnosis not present

## 2021-11-02 DIAGNOSIS — I700 Atherosclerosis of aorta: Secondary | ICD-10-CM | POA: Diagnosis not present

## 2021-11-02 DIAGNOSIS — Z483 Aftercare following surgery for neoplasm: Secondary | ICD-10-CM | POA: Diagnosis not present

## 2021-11-02 DIAGNOSIS — R69 Illness, unspecified: Secondary | ICD-10-CM | POA: Diagnosis not present

## 2021-11-02 DIAGNOSIS — C44712 Basal cell carcinoma of skin of right lower limb, including hip: Secondary | ICD-10-CM | POA: Diagnosis not present

## 2021-11-02 DIAGNOSIS — J45909 Unspecified asthma, uncomplicated: Secondary | ICD-10-CM | POA: Diagnosis not present

## 2021-11-02 DIAGNOSIS — I10 Essential (primary) hypertension: Secondary | ICD-10-CM | POA: Diagnosis not present

## 2021-11-03 NOTE — Progress Notes (Signed)
Name: Patricia Galloway   MRN: [id number]    DOB: 1950-11-12   Date:11/04/2021       Progress Note  Subjective  Chief Complaint  Follow up   HPI  Basal cell carcinoma right leg: she is doing well, s/p removal and chemotherapy. Her gait is back to normal , full rom of motion.  Under the care of Dr. Grayland Ormond . She states she has intermittent aching at the scar area, after last visit with  me she saw a new nodule, had a PET scan , biopsy and MRI femur that showed recurrence of cancer, seen by Dr. Percival Spanish ortho oncologist and had another excisional surgery done 07/2021 and second one 12/22 , she was not very imobile initially, she went to a walker and now only using a cane. She states initially 14.5 cm x 10.5 cm, last week it was measured again and is down to 7 cm x 4 cm   Lung nodules: monitored by Dr. Grayland Ormond ,she had repeat CT done 03/22 stable for over one year. Unchanged    Hyperlipidemia:she refuses statin therapy , unchanged   HTN with white coat: bp is finally controlled without side effects, she is only on norvasc, stopped ARB , continue current regiment  BPPV: started in Dec, severe for 24 hours now only when she moves her head to right side, usually when rolling in bed. Discussed referral to ENT, not associated with nausea or vomiting.    Major Depression in remission:  She is only taking valium prn . She is thankful she has a close friend that took her after her surgery, she still worries about her husband dealing with metastatic cancer to the bone and is in a lot of pain, she is still recovering from her own surgery but has to care for herself and her husband . She will come back to see her friend once every 3 weeks for respite care.    Asthma/COPD:. She could not tolerate Trelegy but has been doing well on Breo , singulair and prn albuterol and doing well  She had a CT lund done 03/22. She states symptoms are controlled with medications.   Atherosclerosis aorta: refuses statin therapy  also not taking aspirin. Reviewed PET scan from 12/15/2020. Stable    Osteoporosis: discussed options, she was seen by Dr Gabriel Carina 03/2018 and was given Alendronate but she states never started medication, she states she has a high calcium diet and some otc supplementations. She is afraid of side effects and does not like taking medications, not interested in injectable medication   Chronic Pancreatitis: found on CT abdomen and also on PET scan done on 11/06/2019 and also on pet done 12/18/2020 , denies nausea, vomiting or indigestion.   Aneurysm of ascending aorta: found on CT 2019, but not on CT 2020 and 2021,and 2022  discussed CTA or MRA, but she is not interested on follow up . Unchanged      Patient Active Problem List   Diagnosis Date Noted   Red blood cell antibody positive 08/03/2021   Mass of thigh 06/11/2021   Basal cell carcinoma 03/29/2017   Thoracic aortic aneurysm without rupture 10/31/2016   Atherosclerosis of aorta ([place]) 10/31/2016   Chronic pancreatitis ([place]) 10/31/2016   Pulmonary nodule 10/31/2016   Centrilobular emphysema ([place]) 10/31/2016   Iron deficiency anemia due to chronic blood loss 10/24/2016   Basal cell carcinoma, leg, right 08/24/2016   Hyperglycemia 08/01/2016   Chronic thoracic back pain 08/04/2015   Panic attack 08/04/2015  White coat syndrome without diagnosis of hypertension 08/04/2015   Major depression, recurrent ([place]) 08/04/2015   Asthma, moderate persistent, poorly-controlled 08/04/2015    Past Surgical History:  Procedure Laterality Date   BASAL CELL CARCINOMA EXCISION Right 03/29/2017   Procedure: EXCISION OF RIGHT LEG BASEL CELL CARCINOMA;  Surgeon: [name], DO;  Location: [facility];  Service: Plastics;  Laterality: Right;   COLON SURGERY  2002   colostomy for 10 months after a perfurated sigmoid    COLOSTOMY REVERSAL  2003   EMBOLIZATION Right 10/12/2016   Procedure: Embolization;  Surgeon: [name], MD;  Location: [facility];  Service: Cardiovascular;  Laterality: Right;   etopic  5974,1638   INCISION AND DRAINAGE OF WOUND Right 03/30/2017   Procedure: IRRIGATION AND DEBRIDEMENT WOUND; VAC PLACEMENT;  Surgeon: [name], DO;  Location: [facility];  Service: Plastics;  Laterality: Right;   TONSILLECTOMY      Family History  Problem Relation Age of Onset   Dementia Mother    Aortic stenosis Mother    Osteoporosis Mother    Dementia Father    Diabetes Father    Hyperlipidemia Father    Hypertension Father    Aneurysm Sister    Hyperlipidemia Sister    Hypertension Sister    Diabetes Sister    Breast cancer Neg Hx     Social History   Tobacco Use   Smoking status: Former    Packs/day: 0.50    Years: 25.00    Pack years: 12.50    Types: Cigarettes    Start date: 09/05/1972    Quit date: 08/03/1998    Years since quitting: 23.2   Smokeless tobacco: Never   Tobacco comments:    smoking cessation materials not required  Substance Use Topics   Alcohol use: No    Alcohol/week: 0.0 standard drinks     Current Outpatient Medications:    albuterol (VENTOLIN HFA) 108 (90 Base) MCG/ACT inhaler, Inhale 2 puffs into the lungs every 6 (six) hours as needed for wheezing or shortness of breath. Patient requests generic Ventolin, Disp: 18 g, Rfl: 0   amLODipine (NORVASC) 5 MG tablet, Take 1 tablet (5 mg total) by mouth daily., Disp: 90 tablet, Rfl: 1   Cholecalciferol (VITAMIN D3) 250 MCG (10000 UT) TABS, Take 1 tablet by mouth 2 (two) times a week., Disp: , Rfl:    diazepam (VALIUM) 5 MG tablet, Take 1 tablet (5 mg total) by mouth daily as needed for anxiety. 30 minutes before doctor's visit, Disp: 30 tablet, Rfl: 0   fluticasone furoate-vilanterol (BREO ELLIPTA) 200-25 MCG/ACT AEPB, Inhale 1 puff into the lungs daily., Disp: 180 each, Rfl: 1   magnesium 30 MG tablet, Take 30 mg by mouth daily., Disp: , Rfl:    montelukast (SINGULAIR) 10 MG tablet, TAKE 1 TABLET BY MOUTH EVERYDAY AT  BEDTIME, Disp: 90 tablet, Rfl: 1   Multiple Vitamin (MULTIVITAMIN) capsule, Take 2 capsules by mouth daily., Disp: , Rfl:    OVER THE COUNTER MEDICATION, Take 1 tablet by mouth 2 (two) times daily. Nutri-Calm, Disp: , Rfl:    OVER THE COUNTER MEDICATION, Life extension bone strength / life extension skin restore ceramide / life extension super bio curcumin, Disp: , Rfl:   Allergies  Allergen Reactions   Penicillins Anaphylaxis    Has patient had a PCN reaction causing immediate rash, facial/tongue/throat swelling, SOB or lightheadedness with hypotension: Yes Has patient had a PCN reaction causing severe rash  involving mucus membranes or skin necrosis: No Has patient had a PCN reaction that required hospitalization: No Has patient had a PCN reaction occurring within the last 10 years: No If all of the above answers are "NO", then may proceed with Cephalosporin use.    Codeine Nausea And Vomiting   Latex Itching   Sulfa Antibiotics Other (See Comments)    TOPICAL-SULFA CAUSED BLISTERS   Tape Other (See Comments)    Reaction:  Blisters and burning  Pt states that paper tape is okay.     Xopenex [Levalbuterol Hcl] Anxiety    I personally reviewed active problem list, medication list, allergies, family history, social history, health maintenance with the patient/caregiver today.   ROS  Constitutional: Negative for fever or weight change.  Respiratory: Negative for cough and shortness of breath.   Cardiovascular: Negative for chest pain or palpitations.  Gastrointestinal: Negative for abdominal pain, no bowel changes.  Musculoskeletal: Negative for gait problem or joint swelling.  Skin: Negative for rash.  Neurological: Negative for dizziness or headache.  No other specific complaints in a complete review of systems (except as listed in HPI above).   Objective  Vitals:   11/04/21 1122  BP: 138/72  Pulse: 91  Resp: 16  SpO2: 98%  Weight: 113 lb (51.3 kg)  Height: 5\' 3"  (1.6 m)     Body mass index is 20.02 kg/m.  Physical Exam  Constitutional: Patient appears well-developed and well-nourished.  No distress.  HEENT: head atraumatic, normocephalic, pupils equal and reactive to light, neck supple Cardiovascular: Normal rate, regular rhythm and normal heart sounds.  No murmur heard. No BLE edema. Pulmonary/Chest: Effort normal and breath sounds normal. No respiratory distress. Abdominal: Soft.  There is no tenderness. Muscular skeletal: dressing on right thigh, using a cane for support  Psychiatric: Patient has a normal mood and affect. behavior is normal. Judgment and thought content normal.   PHQ2/9: Depression screen [facility] 2/9 11/04/2021 10/28/2021 10/08/2021 04/06/2021 03/22/2021  Decreased Interest 0 0 0 0 0  Down, Depressed, Hopeless 0 0 0 0 0  PHQ - 2 Score 0 0 0 0 0  Altered sleeping 0 0 0 0 -  Tired, decreased energy 0 3 0 3 -  Change in appetite 0 0 0 0 -  Feeling bad or failure about yourself  0 0 0 0 -  Trouble concentrating 0 0 0 0 -  Moving slowly or fidgety/restless 0 0 0 0 -  Suicidal thoughts 0 0 0 0 -  PHQ-9 Score 0 3 0 3 -  Difficult doing work/chores - Not difficult at all Not difficult at all - -  Some recent data might be hidden    phq 9 is negative   Fall Risk: Fall Risk  11/04/2021 10/28/2021 10/08/2021 04/06/2021 10/06/2020  Falls in the past year? 0 0 0 0 0  Comment - - - - -  Number falls in past yr: 0 0 0 0 0  Injury with Fall? 0 0 0 0 0  Risk for fall due to : No Fall Risks No Fall Risks No Fall Risks - -  Follow up Falls prevention discussed Falls prevention discussed Falls prevention discussed - -      Functional Status Survey: Is the patient deaf or have difficulty hearing?: No Does the patient have difficulty seeing, even when wearing glasses/contacts?: No Does the patient have difficulty concentrating, remembering, or making decisions?: No Does the patient have difficulty walking or climbing stairs?: Yes Does the  patient have  difficulty dressing or bathing?: No Does the patient have difficulty doing errands alone such as visiting a doctor's office or shopping?: No    Assessment & Plan  1. Centrilobular emphysema ([place])  Doing well on medication  2. Chronic pancreatitis, unspecified pancreatitis type ([place])  Doing well, no symptoms  3. Atherosclerosis of aorta ([facility])  Refuses statin therapy   4. Basal cell carcinoma, leg, right  She is going to see plastic surgeon soon   5. Aneurysm of ascending aorta without rupture  Not seen on the last CT chest   6. Dyslipidemia  Refuses statin therapy   7. Age-related osteoporosis without current pathological fracture  Refuses medication  8. Primary hypertension   9. Colon cancer screening  Refused    Refuses medication

## 2021-11-04 ENCOUNTER — Encounter: Payer: Self-pay | Admitting: Family Medicine

## 2021-11-04 ENCOUNTER — Ambulatory Visit (INDEPENDENT_AMBULATORY_CARE_PROVIDER_SITE_OTHER): Payer: Medicare HMO | Admitting: Family Medicine

## 2021-11-04 VITALS — BP 138/72 | HR 91 | Resp 16 | Ht 63.0 in | Wt 113.0 lb

## 2021-11-04 DIAGNOSIS — M810 Age-related osteoporosis without current pathological fracture: Secondary | ICD-10-CM | POA: Diagnosis not present

## 2021-11-04 DIAGNOSIS — I700 Atherosclerosis of aorta: Secondary | ICD-10-CM | POA: Diagnosis not present

## 2021-11-04 DIAGNOSIS — K861 Other chronic pancreatitis: Secondary | ICD-10-CM | POA: Diagnosis not present

## 2021-11-04 DIAGNOSIS — C44712 Basal cell carcinoma of skin of right lower limb, including hip: Secondary | ICD-10-CM

## 2021-11-04 DIAGNOSIS — Z1211 Encounter for screening for malignant neoplasm of colon: Secondary | ICD-10-CM

## 2021-11-04 DIAGNOSIS — I10 Essential (primary) hypertension: Secondary | ICD-10-CM

## 2021-11-04 DIAGNOSIS — E785 Hyperlipidemia, unspecified: Secondary | ICD-10-CM | POA: Diagnosis not present

## 2021-11-04 DIAGNOSIS — J432 Centrilobular emphysema: Secondary | ICD-10-CM | POA: Diagnosis not present

## 2021-11-04 DIAGNOSIS — I7121 Aneurysm of the ascending aorta, without rupture: Secondary | ICD-10-CM | POA: Diagnosis not present

## 2021-11-04 NOTE — Patient Instructions (Signed)
How to Perform the Epley Maneuver The Epley maneuver is an exercise that relieves symptoms of vertigo. Vertigo is the feeling that you or your surroundings are moving when they are not. When you feel vertigo, you may feel like the room is spinning and may have trouble walking. The Epley maneuver is used for a type of vertigo caused by a calcium deposit in a part of the inner ear. The maneuver involves changing headpositions to help the deposit move out of the area. You can do this maneuver at home whenever you have symptoms of vertigo. You canrepeat it in 24 hours if your vertigo has not gone away. Even though the Epley maneuver may relieve your vertigo for a few weeks, it is possible that your symptoms will return. This maneuver relieves vertigo, but itdoes not relieve dizziness. What are the risks? If it is done correctly, the Epley maneuver is considered safe. Sometimes it can lead to dizziness or nausea that goes away after a short time. If you develop other symptoms--such as changes in vision, weakness, or numbness--stopdoing the maneuver and call your health care provider. Supplies needed: A bed or table. A pillow. How to do the Epley maneuver     Sit on the edge of a bed or table with your back straight and your legs extended or hanging over the edge of the bed or table. Turn your head halfway toward the affected ear or side as told by your health care provider. Lie backward quickly with your head turned until you are lying flat on your back. Your head should dangle (head-hanging position). You may want to position a pillow under your shoulders. Hold this position for at least 30 seconds. If you feel dizzy or have symptoms of vertigo, continue to hold the position until the symptoms stop. Turn your head to the opposite direction until your unaffected ear is facing down. Your head should continue to dangle. Hold this position for at least 30 seconds. If you feel dizzy or have symptoms of  vertigo, continue to hold the position until the symptoms stop. Turn your whole body to the same side as your head so that you are positioned on your side. Your head will now be nearly facedown and no longer needs to dangle. Hold for at least 30 seconds. If you feel dizzy or have symptoms of vertigo, continue to hold the position until the symptoms stop. Sit back up. You can repeat the maneuver in 24 hours if your vertigo does not go away. Follow these instructions at home: For 24 hours after doing the Epley maneuver: Keep your head in an upright position. When lying down to sleep or rest, keep your head raised (elevated) with two or more pillows. Avoid excessive neck movements. Activity Do not drive or use machinery if you feel dizzy. After doing the Epley maneuver, return to your normal activities as told by your health care provider. Ask your health care provider what activities are safe for you. General instructions Drink enough fluid to keep your urine pale yellow. Do not drink alcohol. Take over-the-counter and prescription medicines only as told by your health care provider. Keep all follow-up visits. This is important. Preventing vertigo symptoms Ask your health care provider if there is anything you should do at home to prevent vertigo. He or she may recommend that you: Keep your head elevated with two or more pillows while you sleep. Do not sleep on the side of your affected ear. Get up slowly from bed.   Avoid sudden movements during the day. Avoid extreme head positions or movement, such as looking up or bending over. Contact a health care provider if: Your vertigo gets worse. You have other symptoms, including: Nausea. Vomiting. Headache. Get help right away if you: Have vision changes. Have a headache or neck pain that is severe or getting worse. Cannot stop vomiting. Have new numbness or weakness in any part of your body. These symptoms may represent a serious problem  that is an emergency. Do not wait to see if the symptoms will go away. Get medical help right away. Call your local emergency services (911 in the [place] Do not drive yourself to the hospital. Summary Vertigo is the feeling that you or your surroundings are moving when they are not. The Epley maneuver is an exercise that relieves symptoms of vertigo. If the Epley maneuver is done correctly, it is considered safe. This information is not intended to replace advice given to you by your health care provider. Make sure you discuss any questions you have with your healthcare provider. Document Revised: 07/22/2020 Document Reviewed: 07/22/2020 Elsevier Patient Education  2022 Elsevier Inc.  

## 2021-11-10 ENCOUNTER — Telehealth: Payer: Medicare HMO

## 2021-11-18 ENCOUNTER — Encounter: Payer: Self-pay | Admitting: Family Medicine

## 2021-12-08 ENCOUNTER — Telehealth: Payer: Self-pay

## 2021-12-08 NOTE — Progress Notes (Signed)
? ? ?Chronic Care Management ?Pharmacy Assistant  ? ?Name: Patricia Galloway  MRN: [id number] DOB: 16-May-1951 ? ?Reason for Encounter: Hypertension Disease State Call ? ?Recent office visits:  ?11/04/2021 [name], MD (PCP Office Visit) for Follow-up- Stopped: Benzonatate 100-200 mg, Calcium Carbonate 1250 mg, Valsartan 160 mg all due to patient not taking, No orders placed,   ? ?10/28/2021 [name], LPN (PCP Clinical Support) for Medicare Wellness Exam- No medication changes noted, No orders placed, Mammogram Ordered ? ?10/08/2021 [name], MD (PCP Video Visit) for Follow-up- Stopped: Gabapentin 100 mg, Oxycodone 5-325 mg, Lab orders placed, Patient to follow-up in 6 months ? ?Recent consult visits:  ?10/26/2021 [name], MD (Oncology) for Basal Cell Carcinoma- No medication changes noted, No orders placed, Patient to follow-up in 3 months ? ?Hospital visits:  ?None in previous 6 months ? ?Medications: ?Outpatient Encounter Medications as of 12/08/2021  ?Medication Sig Note  ? albuterol (VENTOLIN HFA) 108 (90 Base) MCG/ACT inhaler Inhale 2 puffs into the lungs every 6 (six) hours as needed for wheezing or shortness of breath. Patient requests generic Ventolin 10/26/2021: PRN  ?  ? amLODipine (NORVASC) 5 MG tablet Take 1 tablet (5 mg total) by mouth daily.   ? Cholecalciferol (VITAMIN D3) 250 MCG (10000 UT) TABS Take 1 tablet by mouth 2 (two) times a week.   ? diazepam (VALIUM) 5 MG tablet Take 1 tablet (5 mg total) by mouth daily as needed for anxiety. 30 minutes before doctor's visit   ? fluticasone furoate-vilanterol (BREO ELLIPTA) 200-25 MCG/ACT AEPB Inhale 1 puff into the lungs daily.   ? magnesium 30 MG tablet Take 30 mg by mouth daily.   ? montelukast (SINGULAIR) 10 MG tablet TAKE 1 TABLET BY MOUTH EVERYDAY AT BEDTIME   ? Multiple Vitamin (MULTIVITAMIN) capsule Take 2 capsules by mouth daily.   ? OVER THE COUNTER MEDICATION Take 1 tablet by mouth 2 (two) times daily. Nutri-Calm   ? OVER THE  COUNTER MEDICATION Life extension bone strength / life extension skin restore ceramide / life extension super bio curcumin   ? ?No facility-administered encounter medications on file as of 12/08/2021.  ? ?Care Gaps: ?Mammogram ? ?Star Rating Drugs: ?None ID ? ?Reviewed chart prior to disease state call. Spoke with patient regarding BP ? ?Recent Office Vitals: ?BP Readings from Last 3 Encounters:  ?11/04/21 138/72  ?04/06/21 136/80  ?02/04/21 (!) 146/82  ? ?Pulse Readings from Last 3 Encounters:  ?11/04/21 91  ?04/06/21 92  ?02/03/21 (!) 118  ?  ?Wt Readings from Last 3 Encounters:  ?11/04/21 113 lb (51.3 kg)  ?04/06/21 115 lb (52.2 kg)  ?02/03/21 114 lb 6.4 oz (51.9 kg)  ?  ?Kidney Function ?Lab Results  ?Component Value Date/Time  ? CREATININE 0.60 11/06/2020 11:29 AM  ? CREATININE 0.76 02/13/2020 11:45 AM  ? CREATININE 0.80 06/17/2019 11:00 AM  ? CREATININE 0.75 02/12/2019 10:08 AM  ? GFRNONAA 81 02/13/2020 11:45 AM  ? GFRAA 93 02/13/2020 11:45 AM  ? ? ?  Latest Ref Rng & Units 11/06/2020  ? 11:29 AM 02/13/2020  ? 11:45 AM 06/17/2019  ? 11:00 AM  ?BMP  ?Glucose 65 - 99 mg/dL  94     ?BUN 7 - 25 mg/dL  14     ?Creatinine 0.44 - 1.00 mg/dL 0.60   0.76   0.80    ?BUN/Creat Ratio 6 - 22 (calc)  NOT APPLICABLE     ?Sodium 135 - 146 mmol/L  137     ?  Potassium 3.5 - 5.3 mmol/L  4.1     ?Chloride 98 - 110 mmol/L  99     ?CO2 20 - 32 mmol/L  29     ?Calcium 8.6 - 10.4 mg/dL  9.9     ? ?Current antihypertensive regimen:  ?Amlodipine 5 mg 1 tablet daily ? ?How often are you checking your Blood Pressure?  Patient does not take her blood pressure at home ? ?Current home BP readings: N/A ? ?What recent interventions/DTPs have been made by any provider to improve Blood Pressure control since last CPP Visit: Valsartan was discontinued ? ?Any recent hospitalizations or ED visits since last visit with CPP? No ? ?What diet changes have been made to improve Blood Pressure Control?  ?Patient has not made any diet changes ? ?What exercise  is being done to improve your Blood Pressure Control?  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 ?Adherence Review: ?Is the patient currently on ACE/ARB medication? No ?Does the patient have >5 day gap between last estimated fill dates? No ? ?Patient denies any ill symptoms. She is doing well. Patient has no concerns or issues at this time. She is taking all medications as directed. She does not need any refills today. ? ?Patient has a telephone appointment with [name], CPP on 03/16/2022 @ 1400 ? ?[name], CPA/CMA ?Clinical Pharmacist Assistant ?Phone: [phone]  ? ? ? ?

## 2022-01-05 ENCOUNTER — Telehealth: Payer: Self-pay

## 2022-01-05 NOTE — Progress Notes (Signed)
? ? ?  Chronic Care Management ?Pharmacy Assistant  ? ?Name: Patricia Galloway  MRN: [id number] DOB: 12/17/1950 ? ?Reason for Encounter: Asthma Disease State Call ?  ?Recent office visits:  ?None ID ? ?Recent consult visits:  ?None ID ? ?Hospital visits:  ?None in previous 6 months ? ?Medications: ?Outpatient Encounter Medications as of 01/05/2022  ?Medication Sig Note  ? albuterol (VENTOLIN HFA) 108 (90 Base) MCG/ACT inhaler Inhale 2 puffs into the lungs every 6 (six) hours as needed for wheezing or shortness of breath. Patient requests generic Ventolin 10/26/2021: PRN  ?  ? amLODipine (NORVASC) 5 MG tablet Take 1 tablet (5 mg total) by mouth daily.   ? Cholecalciferol (VITAMIN D3) 250 MCG (10000 UT) TABS Take 1 tablet by mouth 2 (two) times a week.   ? diazepam (VALIUM) 5 MG tablet Take 1 tablet (5 mg total) by mouth daily as needed for anxiety. 30 minutes before doctor's visit   ? fluticasone furoate-vilanterol (BREO ELLIPTA) 200-25 MCG/ACT AEPB Inhale 1 puff into the lungs daily.   ? magnesium 30 MG tablet Take 30 mg by mouth daily.   ? montelukast (SINGULAIR) 10 MG tablet TAKE 1 TABLET BY MOUTH EVERYDAY AT BEDTIME   ? Multiple Vitamin (MULTIVITAMIN) capsule Take 2 capsules by mouth daily.   ? OVER THE COUNTER MEDICATION Take 1 tablet by mouth 2 (two) times daily. Nutri-Calm   ? OVER THE COUNTER MEDICATION Life extension bone strength / life extension skin restore ceramide / life extension super bio curcumin   ? ?No facility-administered encounter medications on file as of 01/05/2022.  ? ?Care Gaps: ?Mammogram ? ?Star Rating Drugs: ?None ID ? ?Asthma ? ?I spoke with the patient, and she reports that she is doing well. Patient stated that she was actually out in her yard walking around. The patient stated that as far as her asthma goes it has been a little more flared up than normal, and she has been using her inhaler several times per week recently due to the weather. Per patient her asthma typically never bothers her at  all, but she states her inhaler is helping and keeps her breathing under control.  ? ?Patient denies any shortness of breath at the moment and states that her recent episodes have been controlled by her Albuterol Inhaler. Patient was ordered Trelegy but due to cost she doesn't use this medication as she can't afford it. Patient reports that she doesn't take allergy medication either as OTC allergy medications tend to dry her out so much that it causes her to have shortness of breath. Patient reports that she is doing well, and she is aware of how to keep her asthma symptoms under control.  ? ?Patient denies any ill symptoms, and is taking all other medications as directed. Patient has no other concerns or issues today.  ? ?Patient has a telephone follow-up appointment with [name], CPP on 03/16/2022 @ 1400. ? ?[name], CPA/CMA ?Clinical Pharmacist Assistant ?Phone: [phone]  ? ? ?

## 2022-02-07 ENCOUNTER — Encounter: Payer: Self-pay | Admitting: Family Medicine

## 2022-02-07 ENCOUNTER — Other Ambulatory Visit: Payer: Self-pay | Admitting: Internal Medicine

## 2022-02-07 ENCOUNTER — Other Ambulatory Visit: Payer: Self-pay | Admitting: Nurse Practitioner

## 2022-02-07 MED ORDER — SODIUM CHLORIDE 0.9 % IR SOLN
100.0000 mL | Freq: Every day | 3 refills | Status: DC
Start: 2022-02-07 — End: 2022-05-11

## 2022-02-08 NOTE — Telephone Encounter (Signed)
Medication refilled 02/07/2022. Requested Prescriptions  Pending Prescriptions Disp Refills  . sodium chloride irrigation 0.9 % irrigation [Pharmacy Med Name: SODIUM CHLORIDE 0.9% IRRIG.]  3    Sig: IRRIGATE WITH 100 MLS AS DIRECTED DAILY. USE AS NEEDED TO CLEANSE WOUND.     Off-Protocol Failed - 02/07/2022  3:13 PM      Failed - Medication not assigned to a protocol, review manually.      Passed - Valid encounter within last 12 months    Recent Outpatient Visits          3 months ago Centrilobular emphysema [facility])   [facility] [name], MD   4 months ago COPD with asthma [facility])   [facility] [name], MD   10 months ago Basal cell carcinoma, leg, right   [facility] [name], MD   1 year ago Blood pressure check   [facility] [name], MD   1 year ago Thoracic aortic aneurysm without rupture [facility])   [facility] [name], MD      Future Appointments            In 3 months Ancil Boozer, Drue Stager, MD [facility] [place]   In 8 months  [place]

## 2022-02-09 ENCOUNTER — Telehealth: Payer: Self-pay

## 2022-02-09 NOTE — Progress Notes (Cosign Needed)
Chronic Care Management Pharmacy Assistant   Name: Patricia Galloway  MRN: [id number] DOB: 1950-10-02  Reason for Encounter: Hypertension Disease State Call    Recent office visits:  None ID  Recent consult visits:  None ID  Hospital visits:  None in previous 6 months  Medications: Outpatient Encounter Medications as of 02/09/2022  Medication Sig Note   albuterol (VENTOLIN HFA) 108 (90 Base) MCG/ACT inhaler Inhale 2 puffs into the lungs every 6 (six) hours as needed for wheezing or shortness of breath. Patient requests generic Ventolin 10/26/2021: PRN     amLODipine (NORVASC) 5 MG tablet Take 1 tablet (5 mg total) by mouth daily.    Cholecalciferol (VITAMIN D3) 250 MCG (10000 UT) TABS Take 1 tablet by mouth 2 (two) times a week.    diazepam (VALIUM) 5 MG tablet Take 1 tablet (5 mg total) by mouth daily as needed for anxiety. 30 minutes before doctor's visit    fluticasone furoate-vilanterol (BREO ELLIPTA) 200-25 MCG/ACT AEPB Inhale 1 puff into the lungs daily.    magnesium 30 MG tablet Take 30 mg by mouth daily.    montelukast (SINGULAIR) 10 MG tablet TAKE 1 TABLET BY MOUTH EVERYDAY AT BEDTIME    Multiple Vitamin (MULTIVITAMIN) capsule Take 2 capsules by mouth daily.    OVER THE COUNTER MEDICATION Take 1 tablet by mouth 2 (two) times daily. Nutri-Calm    OVER THE COUNTER MEDICATION Life extension bone strength / life extension skin restore ceramide / life extension super bio curcumin    sodium chloride irrigation 0.9 % irrigation Irrigate with 100 mLs as directed daily. Use as needed to cleanse wound.    No facility-administered encounter medications on file as of 02/09/2022.   Care Gaps: Zoster Vaccine Mammogram  Star Rating Drugs: None ID  Reviewed chart prior to disease state call. Spoke with patient regarding BP  Recent Office Vitals: BP Readings from Last 3 Encounters:  11/04/21 138/72  04/06/21 136/80  02/04/21 (!) 146/82   Pulse Readings from Last 3 Encounters:   11/04/21 91  04/06/21 92  02/03/21 (!) 118    Wt Readings from Last 3 Encounters:  11/04/21 113 lb (51.3 kg)  04/06/21 115 lb (52.2 kg)  02/03/21 114 lb 6.4 oz (51.9 kg)    Kidney Function Lab Results  Component Value Date/Time   CREATININE 0.60 11/06/2020 11:29 AM   CREATININE 0.76 02/13/2020 11:45 AM   CREATININE 0.80 06/17/2019 11:00 AM   CREATININE 0.75 02/12/2019 10:08 AM   GFRNONAA 81 02/13/2020 11:45 AM   GFRAA 93 02/13/2020 11:45 AM      Latest Ref Rng & Units 11/06/2020   11:29 AM 02/13/2020   11:45 AM 06/17/2019   11:00 AM  BMP  Glucose 65 - 99 mg/dL  94     BUN 7 - 25 mg/dL  14     Creatinine 0.44 - 1.00 mg/dL 0.60   0.76   0.80    BUN/Creat Ratio 6 - 22 (calc)  NOT APPLICABLE     Sodium 332 - 146 mmol/L  137     Potassium 3.5 - 5.3 mmol/L  4.1     Chloride 98 - 110 mmol/L  99     CO2 20 - 32 mmol/L  29     Calcium 8.6 - 10.4 mg/dL  9.9      Current antihypertensive regimen:  Amlodipine 5 mg 1 tablet daily  How often are you checking your Blood Pressure?  Patient does not monitor at  home  Current home BP readings: N/A  What recent interventions/DTPs have been made by any provider to improve Blood Pressure control since last CPP Visit: None ID  Any recent hospitalizations or ED visits since last visit with CPP? No  What diet changes have been made to improve Blood Pressure Control?  Patient hasn't made any diet changes   Adherence Review: Is the patient currently on ACE/ARB medication? No Does the patient have >5 day gap between last estimated fill dates? No  I spoke with the patient and she reports she is doing well. Patient denies any ill symptoms at this time. Patient had surgery and her wound is still healing, but she does have follow-ups scheduled with her specialist. Patient stated she requested wound cleanser and I informed her that PCP had [facility] the [facility]  [facility]. I informed her that she should call them and if they do not have the prescription for her she can give me a call back.   Patient has no other concerns or issues today.  Patient has a follow-up telephone appointment with [name], CPP on 03/16/2022 @ [place], CPA/CMA Catering manager Phone: [phone]

## 2022-02-22 ENCOUNTER — Ambulatory Visit: Payer: Medicare HMO | Admitting: Family Medicine

## 2022-03-15 ENCOUNTER — Telehealth: Payer: Self-pay

## 2022-03-15 NOTE — Progress Notes (Signed)
    Chronic Care Management Pharmacy Assistant   Name: Patricia Galloway  MRN: [id number] DOB: 1951-04-13  Patient called to be reminded of her telephone appointment with [name], CPP on 03/16/2022 @ 1400.  Patient reports that she has a conflicting appointment at that time tomorrow and requested to reschedule her appointment with CPP.   Patient has been rescheduled to 04/20/2022 @ 0930. Patient would like to discuss onboarding with [facility] on this appointment.   [name], CPA/CMA Clinical Pharmacist Assistant Phone: [phone]

## 2022-03-16 ENCOUNTER — Telehealth: Payer: Medicare HMO

## 2022-03-18 ENCOUNTER — Ambulatory Visit (INDEPENDENT_AMBULATORY_CARE_PROVIDER_SITE_OTHER): Payer: Medicare HMO | Admitting: Plastic Surgery

## 2022-03-18 ENCOUNTER — Encounter: Payer: Self-pay | Admitting: Plastic Surgery

## 2022-03-18 VITALS — BP 215/106 | HR 107 | Ht 63.5 in | Wt 117.2 lb

## 2022-03-18 DIAGNOSIS — C44712 Basal cell carcinoma of skin of right lower limb, including hip: Secondary | ICD-10-CM

## 2022-03-18 DIAGNOSIS — S71101A Unspecified open wound, right thigh, initial encounter: Secondary | ICD-10-CM | POA: Diagnosis not present

## 2022-03-18 DIAGNOSIS — Z85828 Personal history of other malignant neoplasm of skin: Secondary | ICD-10-CM

## 2022-03-18 NOTE — Progress Notes (Signed)
Patient ID: Patricia Galloway, female    DOB: 17-Oct-1950, 71 y.o.   MRN: [id number]   Chief Complaint  Patient presents with  . Advice Only    The patient is a 71 year old female here for evaluation of her right leg.  She saw me 5 years ago when she had a large basal cell carcinoma of her right leg.  She had chemotherapy and responded well.  Peripheral biopsies were done last month and were negative.  She presented for excision of a large skin cancer on the anterior aspect of her right thigh.  It was done in July 2018.  She had some bleeding and was taken back the following day and has done well since.  She had a PET scan done in April 2022.  She was seen by Dr. Junie Spencer for further oncologic resection.  This was done in November 2022 and December 2022.  The final path showed negative margins.  Today she has a 2 x 2 centimeter wound with just a little scab of the area.  It has gotten better over the past month.  She first noticed it when she was doing some yard work and felt it split open.  It is in the middle of where her resection was done.  Nothing looks infected and there is just a little scab there.   Review of Systems  Constitutional: Negative.   Eyes: Negative.   Respiratory: Negative.  Negative for chest tightness and shortness of breath.   Cardiovascular: Negative.  Negative for leg swelling.  Gastrointestinal: Negative.   Endocrine: Negative.   Genitourinary: Negative.   Musculoskeletal: Negative.   Skin:  Positive for wound.    Past Medical History:  Diagnosis Date  . Anemia    has had iron infusion  . Anxiety    complicated grief  . Aortic aneurysm ([place])    found on CT in December, 2017  . Asthma    as child , but no episodes in over 30 years  . Basal cell carcinoma   . Cancer ([place]) 08/2016   basal cell carcinoma - right leg  . History of transfusion of packed red blood cells   . Personal history of chemotherapy 2018   basal cell     Past Surgical History:   Procedure Laterality Date  . BASAL CELL CARCINOMA EXCISION Right 03/29/2017   Procedure: EXCISION OF RIGHT LEG BASEL CELL CARCINOMA;  Surgeon: [name], DO;  Location: [facility];  Service: Plastics;  Laterality: Right;  . COLON SURGERY  2002   colostomy for 10 months after a perfurated sigmoid   . COLOSTOMY REVERSAL  2003  . EMBOLIZATION Right 10/12/2016   Procedure: Embolization;  Surgeon: [name], MD;  Location: [facility];  Service: Cardiovascular;  Laterality: Right;  . etopic  1751,0258  . INCISION AND DRAINAGE OF WOUND Right 03/30/2017   Procedure: IRRIGATION AND DEBRIDEMENT WOUND; VAC PLACEMENT;  Surgeon: [name], DO;  Location: [facility];  Service: Plastics;  Laterality: Right;  . TONSILLECTOMY        Current Outpatient Medications:  .  albuterol (VENTOLIN HFA) 108 (90 Base) MCG/ACT inhaler, Inhale 2 puffs into the lungs every 6 (six) hours as needed for wheezing or shortness of breath. Patient requests generic Ventolin, Disp: 18 g, Rfl: 0 .  amLODipine (NORVASC) 5 MG tablet, Take 1 tablet (5 mg total) by mouth daily., Disp: 90 tablet, Rfl: 1 .  Cholecalciferol (VITAMIN D3) 250 MCG (  10000 UT) TABS, Take 1 tablet by mouth 2 (two) times a week., Disp: , Rfl:  .  diazepam (VALIUM) 5 MG tablet, Take 1 tablet (5 mg total) by mouth daily as needed for anxiety. 30 minutes before doctor's visit, Disp: 30 tablet, Rfl: 0 .  fluticasone furoate-vilanterol (BREO ELLIPTA) 200-25 MCG/ACT AEPB, Inhale 1 puff into the lungs daily., Disp: 180 each, Rfl: 1 .  magnesium 30 MG tablet, Take 30 mg by mouth daily., Disp: , Rfl:  .  montelukast (SINGULAIR) 10 MG tablet, TAKE 1 TABLET BY MOUTH EVERYDAY AT BEDTIME, Disp: 90 tablet, Rfl: 1 .  Multiple Vitamin (MULTIVITAMIN) capsule, Take 2 capsules by mouth daily., Disp: , Rfl:  .  OVER THE COUNTER MEDICATION, Take 1 tablet by mouth 2 (two) times daily. Nutri-Calm, Disp: , Rfl:  .  OVER THE COUNTER MEDICATION, Life  extension bone strength / life extension skin restore ceramide / life extension super bio curcumin, Disp: , Rfl:  .  sodium chloride irrigation 0.9 % irrigation, Irrigate with 100 mLs as directed daily. Use as needed to cleanse wound., Disp: 3000 mL, Rfl: 3   Objective:   Vitals:   03/18/22 1451  BP: (!) 215/106  Pulse: (!) 107  SpO2: 93%    Physical Exam Vitals and nursing note reviewed.  Constitutional:      Appearance: Normal appearance.  HENT:     Head: Normocephalic.  Cardiovascular:     Rate and Rhythm: Normal rate.  Pulmonary:     Effort: Pulmonary effort is normal.  Skin:    General: Skin is warm.     Capillary Refill: Capillary refill takes less than 2 seconds.  Neurological:     Mental Status: She is alert and oriented to person, place, and time.  Psychiatric:        Mood and Affect: Mood normal.        Behavior: Behavior normal.        Thought Content: Thought content normal.    Assessment & Plan:  Basal cell carcinoma, leg, right  Due to her history I would like to watch this.  I would like to see her back in a few weeks.  I have recommended her to continue with the Vaseline.  If we need to we can do Medihoney or collagen.  I do want to watch this because if it does not completely heal up we will need to do a biopsy to make sure it is not a recurrence or a different type of cancer.  Patient understands and agrees with the plan.  Pictures were obtained of the patient and placed in the chart with the patient's or guardian's permission.   [place], DO

## 2022-04-08 ENCOUNTER — Ambulatory Visit (INDEPENDENT_AMBULATORY_CARE_PROVIDER_SITE_OTHER): Payer: Medicare HMO | Admitting: Surgical

## 2022-04-08 DIAGNOSIS — R234 Changes in skin texture: Secondary | ICD-10-CM | POA: Diagnosis not present

## 2022-04-08 DIAGNOSIS — Z85828 Personal history of other malignant neoplasm of skin: Secondary | ICD-10-CM

## 2022-04-08 DIAGNOSIS — C44712 Basal cell carcinoma of skin of right lower limb, including hip: Secondary | ICD-10-CM

## 2022-04-08 NOTE — Progress Notes (Signed)
   Referring Provider [name], MD [address] [place],  [place] [address]   CC: No chief complaint on file.     Patricia Galloway is an 71 y.o. female.  HPI: Patient is a 71 year old female here for follow-up on her right leg wound.  She reports a history of a large basal cell carcinoma that was excised 5 years ago and had subsequent oral chemotherapy per patient.  She reports that she saw Dr. Marla Roe on 03/18/2022 for evaluation of a wound within the incisional site.  She reports she has been doing Vaseline and gauze over the area and washing the wound with specific wound care wash.  She is not having any infectious symptoms.  She reports that she feels as if the area has improved well  Review of Systems General: No fevers or chills  Physical Exam    03/18/2022    2:51 PM 11/04/2021   11:22 AM 04/06/2021   10:50 AM  Vitals with BMI  Height 5' 3.5" '5\' 3"'$  '5\' 3"'$   Weight 117 lbs 3 oz 113 lbs 115 lbs  BMI 20.43 10.93 23.55  Systolic 732 202 542  Diastolic 706 72 80  Pulse 107 91 92    General:  No acute distress,  Alert and oriented, Non-Toxic, Normal speech and affect Right lower extremity:     Assessment/Plan Patient with small scabbing superficial wound on her right lower extremity incision after radical excision of soft tissue tumor of her thigh and knee on 08/23/2021. It has healed well over the last 3 weeks.  There is no signs of infection.  Discussed with patient she no longer has to keep the area covered, recommend following up as needed.  Picture taken and placed in patient's chart with patient's permission.  All of her questions were answered to her content.    Patricia Galloway 04/08/2022, 2:20 PM

## 2022-04-11 ENCOUNTER — Other Ambulatory Visit: Payer: Self-pay | Admitting: Family Medicine

## 2022-04-11 DIAGNOSIS — J449 Chronic obstructive pulmonary disease, unspecified: Secondary | ICD-10-CM

## 2022-04-11 NOTE — Telephone Encounter (Signed)
Next appt 9/6 told to f/u in 6 months

## 2022-04-19 ENCOUNTER — Telehealth: Payer: Self-pay

## 2022-04-19 NOTE — Progress Notes (Signed)
    Chronic Care Management Pharmacy Assistant   Name: Patricia Galloway  MRN: [id number] DOB: [birth year]  Patient called to be reminded of  her telephone appointment with [name], CPP on 04/20/2022 @ 0915.  No answer, left message of appointment date, time and type of appointment (either telephone or in person). Left message to have all medications, supplements, blood pressure and/or blood sugar logs available during appointment and to return call if need to reschedule.  Star Rating Drug: None ID  Any gaps in medications fill history? None  Care Gaps: Zoster Vaccines Mammogram BP>140/90  [name], CPA/CMA Clinical Pharmacist Assistant Phone: [phone]

## 2022-04-20 ENCOUNTER — Ambulatory Visit (INDEPENDENT_AMBULATORY_CARE_PROVIDER_SITE_OTHER): Payer: Medicare HMO

## 2022-04-20 ENCOUNTER — Telehealth: Payer: Medicare HMO

## 2022-04-20 DIAGNOSIS — J432 Centrilobular emphysema: Secondary | ICD-10-CM

## 2022-04-20 DIAGNOSIS — J4540 Moderate persistent asthma, uncomplicated: Secondary | ICD-10-CM

## 2022-04-20 DIAGNOSIS — I10 Essential (primary) hypertension: Secondary | ICD-10-CM

## 2022-04-20 MED ORDER — AMLODIPINE BESYLATE 5 MG PO TABS: 5.0000 mg | ORAL_TABLET | Freq: Every day | ORAL | 1 refills | Status: DC

## 2022-04-20 MED ORDER — ALBUTEROL SULFATE HFA 108 (90 BASE) MCG/ACT IN AERS: 2.0000 | INHALATION_SPRAY | Freq: Four times a day (QID) | RESPIRATORY_TRACT | 1 refills | Status: DC | PRN

## 2022-04-20 MED ORDER — MONTELUKAST SODIUM 10 MG PO TABS: 10.0000 mg | ORAL_TABLET | Freq: Every day | ORAL | 1 refills | Status: DC

## 2022-04-20 MED ORDER — FLUTICASONE FUROATE-VILANTEROL 200-25 MCG/ACT IN AEPB: 1.0000 | INHALATION_SPRAY | Freq: Every day | RESPIRATORY_TRACT | 1 refills | Status: DC

## 2022-04-20 NOTE — Progress Notes (Signed)
Chronic Care Management Pharmacy Note  04/20/2022 Name:  Patricia Galloway MRN:  [id number] DOB:  [birth year]  Summary: Patient presents for CCM follow-up. She notes increased asthma symptoms, but attributes this partially to recovery after her surgery.   She has uncontrolled osteoporosis and hyperlipidemia but is uninterested in medication changes at this time.   Recommendations/Changes made from today's visit: Continue current medications Onboard to [place]: CPP follow-up 6 months  Subjective: Patricia Galloway is an 71 y.o. year old female who is a primary patient of [name], MD.  The CCM team was consulted for assistance with disease management and care coordination needs.    Engaged with patient by telephone for follow up visit in response to provider referral for pharmacy case management and/or care coordination services.   Consent to Services:  The patient was given information about Chronic Care Management services, agreed to services, and gave verbal consent prior to initiation of services.  Please see initial visit note for detailed documentation.   Patient Care Team: [name], MD as PCP - General (Family Medicine) Lloyd Huger, MD as Consulting Physician (Oncology) [place], Loel Lofty, DO as Attending Physician (Plastic Surgery) Germaine Pomfret, [facility] as Pharmacist (Pharmacist)  Recent office visits: 11/04/21: Patient presented to Dr. Ancil Boozer for follow-up. Valsartan stopped.   Recent consult visits: None in previous 6 months  Hospital visits: None in previous 6 months  Objective:  Lab Results  Component Value Date   CREATININE 0.60 11/06/2020   BUN 14 02/13/2020   GFRNONAA 81 02/13/2020   GFRAA 93 02/13/2020   NA 137 02/13/2020   K 4.1 02/13/2020   CALCIUM 9.9 02/13/2020   CO2 29 02/13/2020    Lab Results  Component Value Date/Time   HGBA1C 5.2 02/13/2020 11:45 AM   HGBA1C 5.3 02/12/2019 10:08 AM   HGBA1C 5.9  08/18/2015 12:00 AM   MICROALBUR 0.7 02/13/2020 11:45 AM    Last diabetic Eye exam: No results found for: "HMDIABEYEEXA"  Last diabetic Foot exam: No results found for: "HMDIABFOOTEX"   Lab Results  Component Value Date   CHOL 274 (H) 02/13/2020   HDL 97 02/13/2020   LDLCALC 161 (H) 02/13/2020   TRIG 67 02/13/2020   CHOLHDL 2.8 02/13/2020       Latest Ref Rng & Units 02/13/2020   11:45 AM 02/11/2019    9:55 AM 06/28/2018    1:31 PM  Hepatic Function  Total Protein 6.1 - 8.1 g/dL 7.2  7.6  7.1   Albumin 3.5 - 5.0 g/dL  4.4  4.3   AST 10 - 35 U/L _0 ALT 6 - 29 U/L _1 Alk Phosphatase 38 - 126 U/L  62  61   Total Bilirubin 0.2 - 1.2 mg/dL 0.7  0.8  0.6     Lab Results  Component Value Date/Time   TSH 1.98 02/13/2020 11:45 AM   TSH 2.91 08/18/2015 12:00 AM       Latest Ref Rng & Units 02/13/2020   11:45 AM 02/11/2019    9:55 AM 06/28/2018    1:31 PM  CBC  WBC 3.8 - 10.8 Thousand/uL 8.2  8.8  8.1   Hemoglobin 11.7 - 15.5 g/dL 14.8  14.7  13.2   Hematocrit 35.0 - 45.0 % 44.1  43.3  39.1   Platelets 140 - 400 Thousand/uL 268  269  258     No results found for: "VD25OH"  Clinical ASCVD: No  The ASCVD Risk score (Arnett DK, et al., 2019) failed to calculate for the following reasons:   The valid systolic blood pressure range is 90 to 200 mmHg       11/04/2021   11:22 AM 10/28/2021   10:22 AM 10/08/2021    9:01 AM  Depression screen PHQ 2/9  Decreased Interest 0 0 0  Down, Depressed, Hopeless 0 0 0  PHQ - 2 Score 0 0 0  Altered sleeping 0 0 0  Tired, decreased energy 0 3 0  Change in appetite 0 0 0  Feeling bad or failure about yourself  0 0 0  Trouble concentrating 0 0 0  Moving slowly or fidgety/restless 0 0 0  Suicidal thoughts 0 0 0  PHQ-9 Score 0 3 0  Difficult doing work/chores  Not difficult at all Not difficult at all    Social History   Tobacco Use  Smoking Status Former   Packs/day: 0.50   Years: 25.00   Total pack years: 12.50    Types: Cigarettes   Start date: 09/05/1972   Quit date: 08/03/1998   Years since quitting: 23.7  Smokeless Tobacco Never  Tobacco Comments   smoking cessation materials not required   BP Readings from Last 3 Encounters:  03/18/22 (!) 215/106  11/04/21 138/72  04/06/21 136/80   Pulse Readings from Last 3 Encounters:  03/18/22 (!) 107  11/04/21 91  04/06/21 92   Wt Readings from Last 3 Encounters:  03/18/22 117 lb 3.2 oz (53.2 kg)  11/04/21 113 lb (51.3 kg)  04/06/21 115 lb (52.2 kg)    Assessment/Interventions: Review of patient past medical history, allergies, medications, health status, including review of consultants reports, laboratory and other test data, was performed as part of comprehensive evaluation and provision of chronic care management services.   SDOH:  (Social Determinants of Health) assessments and interventions performed: Yes    CCM Care Plan  Allergies  Allergen Reactions   Penicillins Anaphylaxis    Has patient had a PCN reaction causing immediate rash, facial/tongue/throat swelling, SOB or lightheadedness with hypotension: Yes Has patient had a PCN reaction causing severe rash involving mucus membranes or skin necrosis: No Has patient had a PCN reaction that required hospitalization: No Has patient had a PCN reaction occurring within the last 10 years: No If all of the above answers are "NO", then may proceed with Cephalosporin use.    Codeine Nausea And Vomiting   Latex Itching   Sulfa Antibiotics Other (See Comments)    TOPICAL-SULFA CAUSED BLISTERS   Tape Other (See Comments)    Reaction:  Blisters and burning  Pt states that paper tape is okay.     Xopenex [Levalbuterol Hcl] Anxiety    Medications Reviewed Today     Reviewed by Tresa Moore, CMA (Certified Medical Assistant) on 04/08/22 at 1519  Med List Status: <None>   Medication Order Taking? Sig Documenting Provider Last Dose Status Informant  albuterol (VENTOLIN HFA) 108 (90 Base)  MCG/ACT inhaler [id number] Yes Inhale 2 puffs into the lungs every 6 (six) hours as needed for wheezing or shortness of breath. Patient requests generic Shawna Clamp, MD Taking Active            Med Note (GARNER, Raul Del   Tue Oct 26, 2021 10:32 AM) PRN    amLODipine (NORVASC) 5 MG tablet [id number] Yes Take 1 tablet (5 mg total) by mouth daily. [name], MD Taking Active  Cholecalciferol (VITAMIN D3) 250 MCG (10000 UT) TABS [id number] Yes Take 1 tablet by mouth 2 (two) times a week. [provider] Taking Active   diazepam (VALIUM) 5 MG tablet [id number] Yes Take 1 tablet (5 mg total) by mouth daily as needed for anxiety. 30 minutes before doctor's visit [name], MD Taking Active   fluticasone furoate-vilanterol (BREO ELLIPTA) 200-25 MCG/ACT AEPB [id number] Yes Inhale 1 puff into the lungs daily. [name], MD Taking Active   magnesium 30 MG tablet [id number] Yes Take 30 mg by mouth daily. [provider] Taking Active   montelukast (SINGULAIR) 10 MG tablet [id number] Yes TAKE 1 TABLET BY MOUTH EVERYDAY AT BEDTIME [name], MD Taking Active   Multiple Vitamin (MULTIVITAMIN) capsule [id number] Yes Take 2 capsules by mouth daily. [provider] Taking Active            Med Note Carlene Coria   Tue Apr 06, 2021 10:49 AM)    OVER THE COUNTER MEDICATION [id number] Yes Take 1 tablet by mouth 2 (two) times daily. Nutri-Calm [provider] Taking Active Self           Med Note Ina Homes Oct 28, 2021 10:18 AM)    OVER THE COUNTER MEDICATION [id number] Yes Life extension bone strength / life extension skin restore ceramide / life extension super bio curcumin [provider] Taking Active   sodium chloride irrigation 0.9 % irrigation [id number] Yes Irrigate with 100 mLs as directed daily. Use as needed to cleanse wound. Teodora Medici, DO Taking Active             Patient Active Problem List   Diagnosis Date  Noted   Red blood cell antibody positive 08/03/2021   Mass of thigh 06/11/2021   Basal cell carcinoma 03/29/2017   Thoracic aortic aneurysm without rupture ([place]) 10/31/2016   Atherosclerosis of aorta ([place]) 10/31/2016   Chronic pancreatitis ([place]) 10/31/2016   Pulmonary nodule 10/31/2016   Centrilobular emphysema ([place]) 10/31/2016   Iron deficiency anemia due to chronic blood loss 10/24/2016   Basal cell carcinoma, leg, right 08/24/2016   Hyperglycemia 08/01/2016   Chronic thoracic back pain 08/04/2015   Panic attack 08/04/2015   White coat syndrome without diagnosis of hypertension 08/04/2015   Major depression, recurrent ([place]) 08/04/2015   Asthma, moderate persistent, poorly-controlled 08/04/2015    Immunization History  Administered Date(s) Administered   Tdap 08/08/2016    Conditions to be addressed/monitored:  Hypertension, Hyperlipidemia, Asthma and Osteoporosis  There are no care plans that you recently modified to display for this patient.     Medication Assistance: Application for [facility]  medication assistance program. in process.  Anticipated assistance start date TBD.  See plan of care for additional detail.  Patient's preferred pharmacy is:  [id number] [address], [place] [address] Phone: [phone] Fax: [phone]   Uses pill box? Yes Pt endorses 100% compliance  We discussed: Verbal consent obtained for [facility] enhanced pharmacy services (medication synchronization, adherence packaging, delivery coordination). A medication sync plan was created to allow patient to get all medications delivered once every 30 to 90 days per patient preference. Patient understands they have freedom to choose pharmacy and clinical pharmacist will coordinate care between all prescribers and [facility].  Patient decided to: Utilize [facility] for medication synchronization, packaging and delivery  Care  Plan and Follow Up Patient Decision:  Patient agrees to Care Plan and  Follow-up.  Plan: Telephone follow up appointment with care management team member scheduled for:  10/26/22 at 10:00 AM   Malva Limes, [place] Pharmacist Practitioner  [facility] [phone]

## 2022-04-21 NOTE — Patient Instructions (Signed)
Visit Information It was great speaking with you today!  Please let me know if you have any questions about our visit.   Goals Addressed             This Visit's Progress    Manage My Medicine   On track    Timeframe:  Long-Range Goal Priority:  High Start Date:   11/05/2020                          Expected End Date:  05/13/2022                     Follow Up Date 07/2021    - call for medicine refill 2 or 3 days before it runs out - keep a list of all the medicines I take; vitamins and herbals too - use a pillbox to sort medicine    Why is this important?   These steps will help you keep on track with your medicines.   Notes:         Patient Care Plan: General Pharmacy (Adult)     Problem Identified: Hypertension, Hyperlipidemia, Asthma and Osteoporosis   Priority: High     Long-Range Goal: Patient-Specific Goal   Start Date: 11/05/2020  Expected End Date: 05/12/2022  This Visit's Progress: On track  Recent Progress: On track  Priority: High  Note:   Current Barriers:  Unable to achieve control of cholesterol  Pharmacist Clinical Goal(s):  Over the next 90 days, patient will achieve control of cholesterol as evidenced by LDL less than 100 through collaboration with PharmD and provider.  Prevent bone fractures  Interventions: 1:1 collaboration with [name], MD regarding development and update of comprehensive plan of care as evidenced by provider attestation and co-signature Inter-disciplinary care team collaboration (see longitudinal plan of care) Comprehensive medication review performed; medication list updated in electronic medical record  Hypertension (BP goal <140/90) -Controlled -Current treatment: Amlodipine 5 mg daily  -Medications previously tried: Valsartan -Current home readings: Has not routinely  -Current dietary habits: Patient does not follow any specific dietary regimen -Current exercise habits: El[employer] Bands occasionally.  -Patient  with severe history of white coat hypertension.  -Denies hypotensive/hypertensive symptoms -Recommended to continue current medication  Hyperlipidemia: (LDL goal < 70) -Uncontrolled -Current treatment: None -Medications previously tried: None  -Educated on Benefits of statin for ASCVD risk reduction; patient still not willing to think about starting new treatment at this time.  -Counseled on diet and exercise extensively  Asthma(Goal: control symptoms and prevent exacerbations) -Uncontrolled -Current treatment  Albuterol HFA   Breo Ellipta 1 puff daily  Montelukast 10 mg nightly  -Medications previously tried: Trelegy, Incruse   In the past four weeks, has the patient had:  Daytime asthma symptoms more than twice weekly?   Yes 3-4x  Any instance of night waking due to asthma?   No Utilized albuterol reliever for symptoms more than twice weekly? No - twice  Any activity limitation due to asthma?    Yes - Most often -Exacerbations requiring treatment in last 6 months: None -Recommended to continue current medication  Osteoporosis (Goal improve bone density and prevent fractures) -Uncontrolled -Last DEXA Scan: 10/21/19   T-Score femoral neck: -3.8  T-Score total hip: NA  T-Score lumbar spine: -3.9  T-Score forearm radius: -3.6  10-year probability of major osteoporotic fracture: NA  10-year probability of hip fracture: NA -Patient is a candidate for pharmacologic treatment due to T-Score < -  2.5 in femoral neck and T-Score < -2.5 in lumbar spine -Current treatment  Life extension Ultimate Bone Strength twice daily  Vitamin D 10,000 units twice weekly  -Medications previously tried: NA  -Patient still hesitant to begin osteoporosis treatment. Discussed in details risks of fracture. -Recommend weight-bearing and muscle strengthening exercises for building and maintaining bone density.  -Recommended to continue current medication  Patient Goals/Self-Care Activities Over the next  90 days, patient will:  - check blood pressure weekly, document, and provide at future appointments target a minimum of 150 minutes of moderate intensity exercise weekly  Follow Up Plan: Telephone follow up appointment with care management team member scheduled for:  10/26/22 at 10:00 AM     Patient agreed to services and verbal consent obtained.   Patient verbalizes understanding of instructions and care plan provided today and agrees to view in [place]. Active MyChart status and patient understanding of how to access instructions and care plan via MyChart confirmed with patient.     Malva Limes, [place] Pharmacist Practitioner  [facility] [phone]

## 2022-05-05 DIAGNOSIS — J4540 Moderate persistent asthma, uncomplicated: Secondary | ICD-10-CM

## 2022-05-05 DIAGNOSIS — I10 Essential (primary) hypertension: Secondary | ICD-10-CM

## 2022-05-05 DIAGNOSIS — J432 Centrilobular emphysema: Secondary | ICD-10-CM

## 2022-05-07 ENCOUNTER — Other Ambulatory Visit: Payer: Self-pay | Admitting: Family Medicine

## 2022-05-07 DIAGNOSIS — J4540 Moderate persistent asthma, uncomplicated: Secondary | ICD-10-CM

## 2022-05-07 DIAGNOSIS — J432 Centrilobular emphysema: Secondary | ICD-10-CM

## 2022-05-10 NOTE — Progress Notes (Unsigned)
Name: Patricia Galloway   MRN: [id number]    DOB: Jan 06, 1951   Date:05/11/2022       Progress Note  Subjective  Chief Complaint  Follow Up  HPI  Basal cell carcinoma right leg: she is doing well, s/p removal and chemotherapy. Her gait is back to normal , full rom of motion.  Under the care of Dr. Grayland Ormond . She states she has intermittent aching at the scar area, after last visit with  me she saw a new nodule, had a PET scan , biopsy and MRI femur that showed recurrence of cancer, seen by Dr. Percival Spanish ortho oncologist and had another excisional surgery done 07/2021 and second one 12/22 , she still has some flaking and scarring over the wound, she saw plastic surgeon and is doing well with Vashe solution . She is still having weakness on right thigh, but able to do all ADL and instrumental activity of daily living. She completed home PT   Lung nodules: monitored by Dr. Grayland Ormond ,she had repeat CT done 03/22 stable for over one year. She states she has a follow up with him this year and may have a PET scan    Hyperlipidemia:she refuses statin therapy ,but willing to have labs done   HTN with white coat: bp is finally controlled without side effects, she is only on norvasc bp is at goal today, it was very high when she went to plastic surgeon   BPPV: resolved   Major Depression in remission/Anxiety   She is only taking valium prn . She still has episodes of anxiety but does not feel depressed, she is still worried about her husband that has metastatic cancer and wakes up in the middle of the night crying    Asthma/COPD:. She could not tolerate Trelegy but has been doing well on Breo , singulair and prn albuterol - not using it very often   She had a CT lund done 03/22. She states symptoms are controlled with medications.   Atherosclerosis aorta: refuses statin therapy also not taking aspirin. Reviewed PET scan from 12/15/2020. We will recheck labs today    Osteoporosis: discussed options, she was  seen by Dr Gabriel Carina 03/2018 and was given Alendronate but she states never started medication, she states she has a high calcium diet and some otc supplementations. She is taking bone restore . She does not want any other medications    Chronic Pancreatitis: found on CT abdomen and also on PET scan done on 11/06/2019 and also on pet done 12/18/2020 , denies nausea, vomiting or indigestion. Weight is stable   Aneurysm of ascending aorta: found on CT 2019, but not on CT 2020 and 2021,and 2022  discussed CTA or MRA, but she is not interested on follow up . She continues to refuse statin therapy   Patient Active Problem List   Diagnosis Date Noted   Red blood cell antibody positive 08/03/2021   Mass of thigh 06/11/2021   Basal cell carcinoma 03/29/2017   Thoracic aortic aneurysm without rupture ([place]) 10/31/2016   Atherosclerosis of aorta ([place]) 10/31/2016   Chronic pancreatitis ([place]) 10/31/2016   Pulmonary nodule 10/31/2016   Centrilobular emphysema ([place]) 10/31/2016   Iron deficiency anemia due to chronic blood loss 10/24/2016   Basal cell carcinoma, leg, right 08/24/2016   Hyperglycemia 08/01/2016   Chronic thoracic back pain 08/04/2015   Panic attack 08/04/2015   White coat syndrome with diagnosis of hypertension 08/04/2015   Major depression, recurrent ([place]) 08/04/2015   Asthma,  moderate persistent, poorly-controlled 08/04/2015    Past Surgical History:  Procedure Laterality Date   BASAL CELL CARCINOMA EXCISION Right 03/29/2017   Procedure: EXCISION OF RIGHT LEG BASEL CELL CARCINOMA;  Surgeon: [name], DO;  Location: [facility];  Service: Plastics;  Laterality: Right;   COLON SURGERY  2002   colostomy for 10 months after a perfurated sigmoid    COLOSTOMY REVERSAL  2003   EMBOLIZATION Right 10/12/2016   Procedure: Embolization;  Surgeon: [name], MD;  Location: [facility];  Service: Cardiovascular;  Laterality: Right;   etopic  3710,6269   INCISION AND DRAINAGE  OF WOUND Right 03/30/2017   Procedure: IRRIGATION AND DEBRIDEMENT WOUND; VAC PLACEMENT;  Surgeon: [name], DO;  Location: [facility];  Service: Plastics;  Laterality: Right;   TONSILLECTOMY      Family History  Problem Relation Age of Onset   Dementia Mother    Aortic stenosis Mother    Osteoporosis Mother    Dementia Father    Diabetes Father    Hyperlipidemia Father    Hypertension Father    Aneurysm Sister    Hyperlipidemia Sister    Hypertension Sister    Diabetes Sister    Breast cancer Neg Hx     Social History   Tobacco Use   Smoking status: Former    Packs/day: 0.50    Years: 25.00    Total pack years: 12.50    Types: Cigarettes    Start date: 09/05/1972    Quit date: 08/03/1998    Years since quitting: 23.7   Smokeless tobacco: Never   Tobacco comments:    smoking cessation materials not required  Substance Use Topics   Alcohol use: No    Alcohol/week: 0.0 standard drinks of alcohol     Current Outpatient Medications:    albuterol (VENTOLIN HFA) 108 (90 Base) MCG/ACT inhaler, Inhale 2 puffs into the lungs every 6 (six) hours as needed for wheezing or shortness of breath. Patient requests generic Ventolin, Disp: 18 g, Rfl: 1   amLODipine (NORVASC) 5 MG tablet, Take 1 tablet (5 mg total) by mouth daily., Disp: 90 tablet, Rfl: 1   BREO ELLIPTA 200-25 MCG/ACT AEPB, TAKE 1 PUFF BY MOUTH EVERY DAY, Disp: 180 each, Rfl: 1   Cholecalciferol (VITAMIN D3) 250 MCG (10000 UT) TABS, Take 1 tablet by mouth 2 (two) times a week., Disp: , Rfl:    diazepam (VALIUM) 5 MG tablet, Take 1 tablet (5 mg total) by mouth daily as needed for anxiety. 30 minutes before doctor's visit, Disp: 30 tablet, Rfl: 0   magnesium 30 MG tablet, Take 30 mg by mouth daily., Disp: , Rfl:    montelukast (SINGULAIR) 10 MG tablet, Take 1 tablet (10 mg total) by mouth at bedtime., Disp: 90 tablet, Rfl: 1   Multiple Vitamin (MULTIVITAMIN) capsule, Take 2 capsules by mouth daily., Disp: , Rfl:     OVER THE COUNTER MEDICATION, Take 1 tablet by mouth 2 (two) times daily. Nutri-Calm, Disp: , Rfl:    OVER THE COUNTER MEDICATION, Life extension bone strength / life extension skin restore ceramide / life extension super bio curcumin, Disp: , Rfl:    sodium chloride irrigation 0.9 % irrigation, Irrigate with 100 mLs as directed daily. Use as needed to cleanse wound. (Patient not taking: Reported on 05/11/2022), Disp: 3000 mL, Rfl: 3  Allergies  Allergen Reactions   Penicillins Anaphylaxis    Has patient had a PCN reaction causing immediate rash,  facial/tongue/throat swelling, SOB or lightheadedness with hypotension: Yes Has patient had a PCN reaction causing severe rash involving mucus membranes or skin necrosis: No Has patient had a PCN reaction that required hospitalization: No Has patient had a PCN reaction occurring within the last 10 years: No If all of the above answers are "NO", then may proceed with Cephalosporin use.    Codeine Nausea And Vomiting   Latex Itching   Sulfa Antibiotics Other (See Comments)    TOPICAL-SULFA CAUSED BLISTERS   Tape Other (See Comments)    Reaction:  Blisters and burning  Pt states that paper tape is okay.     Xopenex [Levalbuterol Hcl] Anxiety    I personally reviewed active problem list, medication list, allergies, family history, social history, health maintenance with the patient/caregiver today.   ROS  Constitutional: Negative for fever or weight change.  Respiratory: positive  for intermittent  cough and shortness of breath.   Cardiovascular: Negative for chest pain or palpitations.  Gastrointestinal: Negative for abdominal pain, no bowel changes.  Musculoskeletal: positive for gait problem but no  joint swelling.  Skin: Negative for rash.  Neurological: Negative for dizziness or headache.  No other specific complaints in a complete review of systems (except as listed in HPI above).   Objective  Vitals:   05/11/22 1045  BP: 138/82   Pulse: (!) 106  Resp: 16  SpO2: 98%  Weight: 118 lb (53.5 kg)  Height: '5\' 3"'$  (1.6 m)    Body mass index is 20.9 kg/m.  Physical Exam  Constitutional: Patient appears well-developed and well-nourished.  No distress.  HEENT: head atraumatic, normocephalic, pupils equal and reactive to light, neck supple Cardiovascular: Normal rate, regular rhythm and normal heart sounds.  No murmur heard. No BLE edema. Pulmonary/Chest: Effort normal and breath sounds normal. No respiratory distress. Abdominal: Soft.  There is no tenderness. Skin: some erythema, flakiness over old wound  Psychiatric: Patient has a normal mood and affect. behavior is normal. Judgment and thought content normal.   PHQ2/9:    05/11/2022   10:45 AM 11/04/2021   11:22 AM 10/28/2021   10:22 AM 10/08/2021    9:01 AM 04/06/2021   10:50 AM  Depression screen PHQ 2/9  Decreased Interest 0 0 0 0 0  Down, Depressed, Hopeless 0 0 0 0 0  PHQ - 2 Score 0 0 0 0 0  Altered sleeping 0 0 0 0 0  Tired, decreased energy 0 0 3 0 3  Change in appetite 0 0 0 0 0  Feeling bad or failure about yourself  0 0 0 0 0  Trouble concentrating 0 0 0 0 0  Moving slowly or fidgety/restless 0 0 0 0 0  Suicidal thoughts 0 0 0 0 0  PHQ-9 Score 0 0 3 0 3  Difficult doing work/chores   Not difficult at all Not difficult at all     phq 9 is negative   Fall Risk:    05/11/2022   10:45 AM 11/04/2021   11:21 AM 10/28/2021   10:26 AM 10/08/2021    9:01 AM 04/06/2021   10:49 AM  Fall Risk   Falls in the past year? 0 0 0 0 0  Number falls in past yr: 0 0 0 0 0  Injury with Fall? 0 0 0 0 0  Risk for fall due to : No Fall Risks No Fall Risks No Fall Risks No Fall Risks   Follow up Falls prevention discussed Falls prevention  discussed Falls prevention discussed Falls prevention discussed       Functional Status Survey: Is the patient deaf or have difficulty hearing?: No Does the patient have difficulty seeing, even when wearing glasses/contacts?:  No Does the patient have difficulty concentrating, remembering, or making decisions?: No Does the patient have difficulty walking or climbing stairs?: No Does the patient have difficulty dressing or bathing?: No Does the patient have difficulty doing errands alone such as visiting a doctor's office or shopping?: No    Assessment & Plan  1. Centrilobular emphysema ([place])  Continue Breo  2. COPD with asthma ([place])  - benzonatate (TESSALON) 100 MG capsule; Take 1-2 capsules (100-200 mg total) by mouth 2 (two) times daily as needed.  Dispense: 40 capsule; Refill: 0  3. Aneurysm of ascending aorta without rupture ([place])   4. Atherosclerosis of aorta ([facility])  Refuses statin therapy   5. Depression, major, in remission ([place])  Doing well   6. Chronic pancreatitis, unspecified pancreatitis type ([place])   7. Osteoporosis without current pathological fracture, unspecified osteoporosis type   8. White coat syndrome with hypertension  - diazepam (VALIUM) 5 MG tablet; Take 1 tablet (5 mg total) by mouth daily as needed for anxiety. 30 minutes before doctor's visit  Dispense: 30 tablet; Refill: 0  9. Dyslipidemia   10. History of basal cell carcinoma (BCC) excision

## 2022-05-11 ENCOUNTER — Ambulatory Visit (INDEPENDENT_AMBULATORY_CARE_PROVIDER_SITE_OTHER): Payer: Medicare HMO | Admitting: Family Medicine

## 2022-05-11 ENCOUNTER — Encounter: Payer: Self-pay | Admitting: Family Medicine

## 2022-05-11 VITALS — BP 138/82 | HR 106 | Resp 16 | Ht 63.0 in | Wt 118.0 lb

## 2022-05-11 DIAGNOSIS — J432 Centrilobular emphysema: Secondary | ICD-10-CM | POA: Diagnosis not present

## 2022-05-11 DIAGNOSIS — R69 Illness, unspecified: Secondary | ICD-10-CM | POA: Diagnosis not present

## 2022-05-11 DIAGNOSIS — I700 Atherosclerosis of aorta: Secondary | ICD-10-CM

## 2022-05-11 DIAGNOSIS — I10 Essential (primary) hypertension: Secondary | ICD-10-CM | POA: Diagnosis not present

## 2022-05-11 DIAGNOSIS — I7121 Aneurysm of the ascending aorta, without rupture: Secondary | ICD-10-CM

## 2022-05-11 DIAGNOSIS — F325 Major depressive disorder, single episode, in full remission: Secondary | ICD-10-CM | POA: Insufficient documentation

## 2022-05-11 DIAGNOSIS — J449 Chronic obstructive pulmonary disease, unspecified: Secondary | ICD-10-CM | POA: Diagnosis not present

## 2022-05-11 DIAGNOSIS — E785 Hyperlipidemia, unspecified: Secondary | ICD-10-CM | POA: Diagnosis not present

## 2022-05-11 DIAGNOSIS — K861 Other chronic pancreatitis: Secondary | ICD-10-CM | POA: Diagnosis not present

## 2022-05-11 DIAGNOSIS — Z9889 Other specified postprocedural states: Secondary | ICD-10-CM | POA: Diagnosis not present

## 2022-05-11 DIAGNOSIS — M810 Age-related osteoporosis without current pathological fracture: Secondary | ICD-10-CM | POA: Insufficient documentation

## 2022-05-11 DIAGNOSIS — Z85828 Personal history of other malignant neoplasm of skin: Secondary | ICD-10-CM | POA: Insufficient documentation

## 2022-05-11 MED ORDER — BENZONATATE 100 MG PO CAPS: 100.0000 mg | ORAL_CAPSULE | Freq: Two times a day (BID) | ORAL | 0 refills | Status: DC | PRN

## 2022-05-11 MED ORDER — DIAZEPAM 5 MG PO TABS: 5.0000 mg | ORAL_TABLET | Freq: Every day | ORAL | 0 refills | Status: DC | PRN

## 2022-05-12 LAB — COMPLETE METABOLIC PANEL WITH GFR
AG Ratio: 2.2 (calc) (ref 1.0–2.5)
ALT: 12 U/L (ref 6–29)
AST: 15 U/L (ref 10–35)
Albumin: 4.9 g/dL (ref 3.6–5.1)
Alkaline phosphatase (APISO): 67 U/L (ref 37–153)
BUN: 14 mg/dL (ref 7–25)
CO2: 25 mmol/L (ref 20–32)
Calcium: 9.6 mg/dL (ref 8.6–10.4)
Chloride: 100 mmol/L (ref 98–110)
Creat: 0.7 mg/dL (ref 0.60–1.00)
Globulin: 2.2 g/dL (calc) (ref 1.9–3.7)
Glucose, Bld: 87 mg/dL (ref 65–99)
Potassium: 4 mmol/L (ref 3.5–5.3)
Sodium: 136 mmol/L (ref 135–146)
Total Bilirubin: 0.4 mg/dL (ref 0.2–1.2)
Total Protein: 7.1 g/dL (ref 6.1–8.1)
eGFR: 93 mL/min/{1.73_m2} (ref >=60)

## 2022-05-12 LAB — CBC WITH DIFFERENTIAL/PLATELET
Absolute Monocytes: 1077 cells/uL — ABNORMAL HIGH (ref 200–950)
Basophils Absolute: 62 cells/uL (ref 0–200)
Basophils Relative: 0.7 %
Eosinophils Absolute: 125 cells/uL (ref 15–500)
Eosinophils Relative: 1.4 %
HCT: 42 % (ref 35.0–45.0)
Hemoglobin: 14.3 g/dL (ref 11.7–15.5)
Lymphs Abs: 2732 cells/uL (ref 850–3900)
MCH: 30.2 pg (ref 27.0–33.0)
MCHC: 34 g/dL (ref 32.0–36.0)
MCV: 88.8 fL (ref 80.0–100.0)
MPV: 10.5 fL (ref 7.5–12.5)
Monocytes Relative: 12.1 %
Neutro Abs: 4904 cells/uL (ref 1500–7800)
Neutrophils Relative %: 55.1 %
Platelets: 331 10*3/uL (ref 140–400)
RBC: 4.73 10*6/uL (ref 3.80–5.10)
RDW: 13.5 % (ref 11.0–15.0)
Total Lymphocyte: 30.7 %
WBC: 8.9 10*3/uL (ref 3.8–10.8)

## 2022-05-12 LAB — LIPID PANEL
Cholesterol: 295 mg/dL — ABNORMAL HIGH (ref <200)
HDL: 99 mg/dL (ref >=50)
LDL Cholesterol (Calc): 178 mg/dL (calc) — ABNORMAL HIGH
Non-HDL Cholesterol (Calc): 196 mg/dL (calc) — ABNORMAL HIGH (ref <130)
Total CHOL/HDL Ratio: 3 (calc) (ref <5.0)
Triglycerides: 77 mg/dL (ref <150)

## 2022-05-12 LAB — VITAMIN D 25 HYDROXY (VIT D DEFICIENCY, FRACTURES): Vit D, 25-Hydroxy: 38 ng/mL (ref 30–100)

## 2022-06-11 ENCOUNTER — Other Ambulatory Visit: Payer: Self-pay | Admitting: Family Medicine

## 2022-06-11 DIAGNOSIS — I10 Essential (primary) hypertension: Secondary | ICD-10-CM

## 2022-06-17 ENCOUNTER — Telehealth: Payer: Self-pay | Admitting: *Deleted

## 2022-06-17 NOTE — Patient Outreach (Signed)
  Care Coordination   06/17/2022 Name: Patricia Galloway MRN: [id number] DOB: 01-28-1951   Care Coordination Outreach Attempts:  An unsuccessful telephone outreach was attempted today to offer the patient information about available care coordination services as a benefit of their health plan.   Follow Up Plan:  Additional outreach attempts will be made to offer the patient care coordination information and services.   Encounter Outcome:  No Answer  Care Coordination Interventions Activated:  Yes   Care Coordination Interventions:  No, not indicated    [facility] [phone]

## 2022-06-27 ENCOUNTER — Telehealth: Payer: Self-pay

## 2022-06-27 NOTE — Progress Notes (Signed)
    Chronic Care Management Pharmacy Assistant   Name: Patricia Galloway  MRN: [id number] DOB: [birth year]  Reason for Encounter: Medication Review/Medication Coordination for [facility]   Recent office visits:  05/11/2022 Sherley Bounds (PCP Office Visit) for Follow-up-Started: Benzonatate 100-200 mg twice daily prn Stop: Sodium Chloride, No orders placed, No follow-up noted  Recent consult visits:  None ID  Hospital visits:  None in previous 6 months  Medications: Outpatient Encounter Medications as of 06/27/2022  Medication Sig   albuterol (VENTOLIN HFA) 108 (90 Base) MCG/ACT inhaler Inhale 2 puffs into the lungs every 6 (six) hours as needed for wheezing or shortness of breath. Patient requests generic Ventolin   amLODipine (NORVASC) 5 MG tablet TAKE 1 TABLET (5 MG TOTAL) BY MOUTH DAILY.   benzonatate (TESSALON) 100 MG capsule Take 1-2 capsules (100-200 mg total) by mouth 2 (two) times daily as needed.   BREO ELLIPTA 200-25 MCG/ACT AEPB TAKE 1 PUFF BY MOUTH EVERY DAY   Cholecalciferol (VITAMIN D3) 250 MCG (10000 UT) TABS Take 1 tablet by mouth 2 (two) times a week.   diazepam (VALIUM) 5 MG tablet Take 1 tablet (5 mg total) by mouth daily as needed for anxiety. 30 minutes before doctor's visit   magnesium 30 MG tablet Take 30 mg by mouth daily.   montelukast (SINGULAIR) 10 MG tablet Take 1 tablet (10 mg total) by mouth at bedtime.   Multiple Vitamin (MULTIVITAMIN) capsule Take 2 capsules by mouth daily.   OVER THE COUNTER MEDICATION Take 1 tablet by mouth 2 (two) times daily. Nutri-Calm   OVER THE COUNTER MEDICATION Life extension bone strength / life extension skin restore ceramide / life extension super bio curcumin   Wound Cleansers (VASHE WOUND) 0.033 % SOLN Apply 1 each topically daily at 12 noon.   No facility-administered encounter medications on file as of 06/27/2022.   Care Gaps: Zoster Vaccines Mammogram  Star Rating Drugs: None ID  BP Readings from Last 3  Encounters:  05/11/22 138/82  03/18/22 (!) 215/106  11/04/21 138/72    Lab Results  Component Value Date   HGBA1C 5.2 02/13/2020    Patient obtains medications through Vials  30 Days   Last adherence delivery included:  This is the 1st coordination for this patient.  Patient declined medications for the month of October: 1st Coordination for this patient. Not sure if any medications were declined last month  Patient is due for next adherence delivery on: 07/07/2022 1st Route.  Called patient and reviewed medications and coordinated delivery.  This delivery to include: Montelukast 10 mg 1 tablet daily at bed time Breo Inhaler 200-25 mcg 1 puff daily  Patient declined the following medications for the month of November: Amlodipine 5 mg 1 tablet- Patient stated she has a 1 month supply so she will not need this until next month Ventolin Inhaler mcg- PRN medication patient has a sufficient supply  Patient needs refills for Breo Inhaler. This is a PCP medication and CPP can send refill.  Confirmed delivery date of 07/07/2022 1st route, advised patient that pharmacy will contact them the morning of delivery.  Patient has a face to face appointment with [name], CPP on 10/26/2022 @ 1000.  [name], CPA/CMA Clinical Pharmacist Assistant Phone: [phone]

## 2022-07-22 ENCOUNTER — Telehealth: Payer: Self-pay

## 2022-07-22 DIAGNOSIS — J432 Centrilobular emphysema: Secondary | ICD-10-CM

## 2022-07-22 DIAGNOSIS — J4540 Moderate persistent asthma, uncomplicated: Secondary | ICD-10-CM

## 2022-07-22 NOTE — Progress Notes (Signed)
    Chronic Care Management Pharmacy Assistant   Name: Patricia Galloway  MRN: [id number] DOB: 1951/04/27  Reason for Encounter: Medication Review/Medication Coordination for [facility]   Recent office visits:  None ID  Recent consult visits:  None ID  Hospital visits:  None in previous 6 months  Medications: Outpatient Encounter Medications as of 07/22/2022  Medication Sig   albuterol (VENTOLIN HFA) 108 (90 Base) MCG/ACT inhaler Inhale 2 puffs into the lungs every 6 (six) hours as needed for wheezing or shortness of breath. Patient requests generic Ventolin   amLODipine (NORVASC) 5 MG tablet TAKE 1 TABLET (5 MG TOTAL) BY MOUTH DAILY.   benzonatate (TESSALON) 100 MG capsule Take 1-2 capsules (100-200 mg total) by mouth 2 (two) times daily as needed.   BREO ELLIPTA 200-25 MCG/ACT AEPB TAKE 1 PUFF BY MOUTH EVERY DAY   Cholecalciferol (VITAMIN D3) 250 MCG (10000 UT) TABS Take 1 tablet by mouth 2 (two) times a week.   diazepam (VALIUM) 5 MG tablet Take 1 tablet (5 mg total) by mouth daily as needed for anxiety. 30 minutes before doctor's visit   magnesium 30 MG tablet Take 30 mg by mouth daily.   montelukast (SINGULAIR) 10 MG tablet Take 1 tablet (10 mg total) by mouth at bedtime.   Multiple Vitamin (MULTIVITAMIN) capsule Take 2 capsules by mouth daily.   OVER THE COUNTER MEDICATION Take 1 tablet by mouth 2 (two) times daily. Nutri-Calm   OVER THE COUNTER MEDICATION Life extension bone strength / life extension skin restore ceramide / life extension super bio curcumin   Wound Cleansers (VASHE WOUND) 0.033 % SOLN Apply 1 each topically daily at 12 noon.   No facility-administered encounter medications on file as of 07/22/2022.   Care Gaps: Zoster Vaccines Mammogram  Star Rating Drugs: None ID  BP Readings from Last 3 Encounters:  05/11/22 138/82  03/18/22 (!) 215/106  11/04/21 138/72    Lab Results  Component Value Date   HGBA1C 5.2 02/13/2020    Patient obtains  medications through Vials  30 Days   Last adherence delivery included:  Montelukast 10 mg 1 tablet daily at bed time Breo Inhaler 200-25 mcg 1 puff daily  Patient declined the following medications for the month of November: Amlodipine 5 mg 1 tablet- Patient stated she has a 1 month supply so she will not need this until next month Ventolin Inhaler mcg- PRN medication patient has a sufficient supply  Patient is due for next adherence delivery on: 08/08/2022 1st Route.  Called patient and reviewed medications and coordinated delivery.  This delivery to include: Amlodipine 5 mg 1 tablet  Montelukast 10 mg 1 tablet daily at bed time Breo Inhaler 200-25 mcg 1 puff daily  Patient declined the following medications for the month of December: Ventolin Inhaler 108 mcg- PRN medication patient has a sufficient supply   Patient needs refills for the month of December: Breo Inhaler 200-25 mcg this is a PCP medication that CPP can send refill for   Confirmed delivery date of 12/04/20223 1[facility] Route, advised patient that pharmacy will contact them the morning of delivery.  Patient has a follow-up with CPP on 10/26/2022 @ 1000.  [name], CPA/CMA Clinical Pharmacist Assistant Phone: [phone]

## 2022-07-23 ENCOUNTER — Emergency Department (HOSPITAL_COMMUNITY): Admission: EM | Admit: 2022-07-23 | Payer: Medicare HMO | Source: Home / Self Care

## 2022-07-23 NOTE — ED Triage Notes (Deleted)
Pt BIB [facility] coming from home. Pt presents with CP that started 1 hour ago. EMS report pt started in NSR c 1st degree block and then during transport went bradycardic with a 2nd degree type 2 block and pt started feeling worse. B/P remained 120/60. EMS admin '324mg'$  of ASA and 1 NTG SL without relief.

## 2022-08-04 MED ORDER — FLUTICASONE FUROATE-VILANTEROL 200-25 MCG/ACT IN AEPB: 1.0000 | INHALATION_SPRAY | Freq: Every day | RESPIRATORY_TRACT | 1 refills | Status: DC

## 2022-08-04 NOTE — Addendum Note (Signed)
Addended by: Daron Offer A on: 08/04/2022 11:53 AM   Modules accepted: Orders

## 2022-08-24 ENCOUNTER — Telehealth: Payer: Self-pay

## 2022-08-24 NOTE — Progress Notes (Signed)
    Chronic Care Management Pharmacy Assistant   Name: Patricia Galloway  MRN: [id number] DOB: 03-Sep-1951  Reason for Encounter: Medication Review/Medication Coordination for [facility]   Recent office visits:  None ID  Recent consult visits:  None ID  Hospital visits:  None in previous 6 months  Medications: Outpatient Encounter Medications as of 08/24/2022  Medication Sig   albuterol (VENTOLIN HFA) 108 (90 Base) MCG/ACT inhaler Inhale 2 puffs into the lungs every 6 (six) hours as needed for wheezing or shortness of breath. Patient requests generic Ventolin   amLODipine (NORVASC) 5 MG tablet TAKE 1 TABLET (5 MG TOTAL) BY MOUTH DAILY.   benzonatate (TESSALON) 100 MG capsule Take 1-2 capsules (100-200 mg total) by mouth 2 (two) times daily as needed.   Cholecalciferol (VITAMIN D3) 250 MCG (10000 UT) TABS Take 1 tablet by mouth 2 (two) times a week.   diazepam (VALIUM) 5 MG tablet Take 1 tablet (5 mg total) by mouth daily as needed for anxiety. 30 minutes before doctor's visit   fluticasone furoate-vilanterol (BREO ELLIPTA) 200-25 MCG/ACT AEPB Inhale 1 puff into the lungs daily.   magnesium 30 MG tablet Take 30 mg by mouth daily.   montelukast (SINGULAIR) 10 MG tablet Take 1 tablet (10 mg total) by mouth at bedtime.   Multiple Vitamin (MULTIVITAMIN) capsule Take 2 capsules by mouth daily.   OVER THE COUNTER MEDICATION Take 1 tablet by mouth 2 (two) times daily. Nutri-Calm   OVER THE COUNTER MEDICATION Life extension bone strength / life extension skin restore ceramide / life extension super bio curcumin   Wound Cleansers (VASHE WOUND) 0.033 % SOLN Apply 1 each topically daily at 12 noon.   No facility-administered encounter medications on file as of 08/24/2022.   Care Gaps: Zoster Vaccines Mammogram   Star Rating Drugs: None ID  BP Readings from Last 3 Encounters:  05/11/22 138/82  03/18/22 (!) 215/106  11/04/21 138/72    Lab Results  Component Value Date   HGBA1C  5.2 02/13/2020    Patient obtains medications through Vials  30 Days   Last adherence delivery included:  Amlodipine 5 mg 1 tablet  Montelukast 10 mg 1 tablet daily at bed time Breo Inhaler 200-25 mcg 1 puff daily   Patient declined the following medications for the month of December: Ventolin Inhaler 108 mcg- PRN medication patient has a sufficient supply   Patient is due for next adherence delivery on: 09/06/2022 1st Route.  Called patient and reviewed medications and coordinated delivery.  This delivery to include: Amlodipine 5 mg 1 tablet  Montelukast 10 mg 1 tablet daily at bed time Breo Inhaler 200-25 mcg 1 puff daily Ventolin Inhaler 108 mcg  Patient declined the following medications for the month of January: Ventolin Inhaler 108 mcg- PRN medication patient has a sufficient supply   Patient needs refills for : No refills needed  Confirmed delivery date of 09/06/2022, advised patient that pharmacy will contact them the morning of delivery.  [name], CPA/CMA Clinical Pharmacist Assistant Phone: [phone]

## 2022-09-23 ENCOUNTER — Telehealth: Payer: Self-pay

## 2022-09-23 NOTE — Progress Notes (Signed)
Care Management & Coordination Services Pharmacy Team  Care Coordination Pharmacy Assistant   Name: Patricia Galloway  MRN: [id number] DOB: 12-31-1950   Reason for Encounter: Medication coordination and delivery  Contacted patient on 09/23/2022 to discuss medications   Recent office visits:  None ID  Recent consult visits:  None ID  Hospital visits:  None in previous 6 months  Medications: Outpatient Encounter Medications as of 09/23/2022  Medication Sig   albuterol (VENTOLIN HFA) 108 (90 Base) MCG/ACT inhaler Inhale 2 puffs into the lungs every 6 (six) hours as needed for wheezing or shortness of breath. Patient requests generic Ventolin   amLODipine (NORVASC) 5 MG tablet TAKE 1 TABLET (5 MG TOTAL) BY MOUTH DAILY.   benzonatate (TESSALON) 100 MG capsule Take 1-2 capsules (100-200 mg total) by mouth 2 (two) times daily as needed.   Cholecalciferol (VITAMIN D3) 250 MCG (10000 UT) TABS Take 1 tablet by mouth 2 (two) times a week.   diazepam (VALIUM) 5 MG tablet Take 1 tablet (5 mg total) by mouth daily as needed for anxiety. 30 minutes before doctor's visit   fluticasone furoate-vilanterol (BREO ELLIPTA) 200-25 MCG/ACT AEPB Inhale 1 puff into the lungs daily.   magnesium 30 MG tablet Take 30 mg by mouth daily.   montelukast (SINGULAIR) 10 MG tablet Take 1 tablet (10 mg total) by mouth at bedtime.   Multiple Vitamin (MULTIVITAMIN) capsule Take 2 capsules by mouth daily.   OVER THE COUNTER MEDICATION Take 1 tablet by mouth 2 (two) times daily. Nutri-Calm   OVER THE COUNTER MEDICATION Life extension bone strength / life extension skin restore ceramide / life extension super bio curcumin   Wound Cleansers (VASHE WOUND) 0.033 % SOLN Apply 1 each topically daily at 12 noon.   No facility-administered encounter medications on file as of 09/23/2022.   BP Readings from Last 3 Encounters:  05/11/22 138/82  03/18/22 (!) 215/106  11/04/21 138/72    Pulse Readings from Last 3 Encounters:   05/11/22 (!) 106  03/18/22 (!) 107  11/04/21 91    Lab Results  Component Value Date/Time   HGBA1C 5.2 02/13/2020 11:45 AM   HGBA1C 5.3 02/12/2019 10:08 AM   HGBA1C 5.9 08/18/2015 12:00 AM   Lab Results  Component Value Date   CREATININE 0.70 05/11/2022   BUN 14 05/11/2022   GFRNONAA 81 02/13/2020   GFRAA 93 02/13/2020   NA 136 05/11/2022   K 4.0 05/11/2022   CALCIUM 9.6 05/11/2022   CO2 25 05/11/2022   Last adherence delivery date:09/06/2022       Patient is due for next adherence delivery on: 10/05/2022 1st Route  Spoke with patient on 09/23/2022 and reviewed medications. Patient denied the need for any medications at this time.  This delivery to include: Vials  30 Days  Amlodipine 5 mg 1 tablet  Montelukast 10 mg 1 tablet daily at bed time Breo Inhaler 200-25 mcg 1 puff daily  Patient declined the following medications this month: Ventolin Inhaler 108 mcg-PRN medication patient has a sufficient supply    No refill request needed.  Confirmed delivery date of 10/05/2022 1st Route, advised patient that pharmacy will contact them the morning of delivery.  Any concerns about your medications? No  How often do you forget or accidentally miss a dose? Never  Do you use a pillbox? No  Is patient in packaging No    Cycle dispensing form sent to Clarita Leber, [employer] for review.   Patient has a follow-up with CPP on  10/26/2022 @ 1000.  [name], CPA/CMA Clinical Pharmacist Assistant Phone: [phone]

## 2022-10-25 ENCOUNTER — Telehealth: Payer: Self-pay

## 2022-10-25 NOTE — Progress Notes (Signed)
Care Management & Coordination Services Pharmacy Team Pharmacy Assistant   Name: Patricia Galloway  MRN: [id number] DOB: March 15, 1951  Reason for Encounter: Medication Coordination and Delivery for [facility]/Confirm CPP appointment  Patient has a follow-up in office with CPP tomorrow @ 1000  Contacted patient to discuss medications and coordinate delivery from [facility]. CPP had appointment with the patient today and completed the med sycn  Chart review: Recent office visits:  None ID  Recent consult visits:  None ID  Hospital visits:  None in previous 6 months  Medications: Outpatient Encounter Medications as of 10/25/2022  Medication Sig   albuterol (VENTOLIN HFA) 108 (90 Base) MCG/ACT inhaler Inhale 2 puffs into the lungs every 6 (six) hours as needed for wheezing or shortness of breath. Patient requests generic Ventolin   amLODipine (NORVASC) 5 MG tablet TAKE 1 TABLET (5 MG TOTAL) BY MOUTH DAILY.   benzonatate (TESSALON) 100 MG capsule Take 1-2 capsules (100-200 mg total) by mouth 2 (two) times daily as needed.   Cholecalciferol (VITAMIN D3) 250 MCG (10000 UT) TABS Take 1 tablet by mouth 2 (two) times a week.   diazepam (VALIUM) 5 MG tablet Take 1 tablet (5 mg total) by mouth daily as needed for anxiety. 30 minutes before doctor's visit   fluticasone furoate-vilanterol (BREO ELLIPTA) 200-25 MCG/ACT AEPB Inhale 1 puff into the lungs daily.   magnesium 30 MG tablet Take 30 mg by mouth daily.   montelukast (SINGULAIR) 10 MG tablet Take 1 tablet (10 mg total) by mouth at bedtime.   Multiple Vitamin (MULTIVITAMIN) capsule Take 2 capsules by mouth daily.   OVER THE COUNTER MEDICATION Take 1 tablet by mouth 2 (two) times daily. Nutri-Calm   OVER THE COUNTER MEDICATION Life extension bone strength / life extension skin restore ceramide / life extension super bio curcumin   Wound Cleansers (VASHE WOUND) 0.033 % SOLN Apply 1 each topically daily at 12 noon.   No  facility-administered encounter medications on file as of 10/25/2022.   BP Readings from Last 3 Encounters:  05/11/22 138/82  03/18/22 (!) 215/106  11/04/21 138/72    Pulse Readings from Last 3 Encounters:  05/11/22 (!) 106  03/18/22 (!) 107  11/04/21 91    Lab Results  Component Value Date/Time   HGBA1C 5.2 02/13/2020 11:45 AM   HGBA1C 5.3 02/12/2019 10:08 AM   HGBA1C 5.9 08/18/2015 12:00 AM   Lab Results  Component Value Date   CREATININE 0.70 05/11/2022   BUN 14 05/11/2022   GFRNONAA 81 02/13/2020   GFRAA 93 02/13/2020   NA 136 05/11/2022   K 4.0 05/11/2022   CALCIUM 9.6 05/11/2022   CO2 25 05/11/2022   Cycle dispensing form sent to Clarita Leber, [employer] for review.   Last adherence delivery date: 10/05/2022      Patient is due for next adherence delivery on: 11/04/2022  This delivery to include: Vials  30 Days  Amlodipine 5 mg 1 tablet  Montelukast 10 mg 1 tablet daily at bed time Breo Inhaler 200-25 mcg 1 puff daily  Patient declined the following medications this month: Ventolin Inhaler 108 mcg-PRN medication patient has a sufficient supply     No refill request needed.  Confirmed delivery date of 11/04/2022 1st Route, advised patient that pharmacy will contact them the morning of delivery.  Any concerns about your medications? No  How often do you forget or accidentally miss a dose? Never  Do you use a pillbox? No  Is patient in packaging No  [name], CPA/CMA Clinical Pharmacist Assistant Phone: [phone]

## 2022-10-26 ENCOUNTER — Ambulatory Visit: Payer: Medicare HMO

## 2022-10-26 ENCOUNTER — Other Ambulatory Visit: Payer: Self-pay

## 2022-10-26 DIAGNOSIS — J4489 Other specified chronic obstructive pulmonary disease: Secondary | ICD-10-CM

## 2022-10-26 DIAGNOSIS — M810 Age-related osteoporosis without current pathological fracture: Secondary | ICD-10-CM

## 2022-10-26 DIAGNOSIS — Z1231 Encounter for screening mammogram for malignant neoplasm of breast: Secondary | ICD-10-CM

## 2022-10-26 DIAGNOSIS — I700 Atherosclerosis of aorta: Secondary | ICD-10-CM

## 2022-10-26 NOTE — Progress Notes (Signed)
Care Management & Coordination Services Pharmacy Note  10/26/2022 Name:  Patricia Galloway MRN:  [id number] DOB:  1951/06/07  Summary: Patient presents for follow-up consult.   -Patient with worsening asthma symptoms in response to allergens (pollen, pet dander, mildew). She is utilizing her Breo daily and requiring use of her albuterol more frequently.   -Educated on Benefits of statin for ASCVD risk reduction; patient still not willing to think about starting new treatment at this time. She is working on cutting down her meat consumption and staying active.   -Patient still hesitant to begin osteoporosis treatment. Discussed in details risks of fracture.  Recommendations/Changes made from today's visit: Order DEXA and Mammogram today.  -Recommend stepping up therapy to triple therapy.Patient was not interested in retrying Trelegy or trying Breztri at this time.  -Recommend cetirizine for allergy symptoms. Patient declines this at this time. Counseled patient on allergen avoidance, use of masking, etc.   Follow up plan: CPP follow-up 6 months   Subjective: Patricia Galloway is an 72 y.o. year old female who is a primary patient of [name], MD.  The care coordination team was consulted for assistance with disease management and care coordination needs.    Engaged with patient by telephone for follow up visit.  Recent office visits: 05/31/22: Patient presented to Dr. Ancil Boozer for follow-up.   Recent consult visits: None in past 6 months  Hospital visits: None in previous 6 months   Objective:  Lab Results  Component Value Date   CREATININE 0.70 05/11/2022   BUN 14 05/11/2022   EGFR 93 05/11/2022   GFRNONAA 81 02/13/2020   GFRAA 93 02/13/2020   NA 136 05/11/2022   K 4.0 05/11/2022   CALCIUM 9.6 05/11/2022   CO2 25 05/11/2022   GLUCOSE 87 05/11/2022    Lab Results  Component Value Date/Time   HGBA1C 5.2 02/13/2020 11:45 AM   HGBA1C 5.3 02/12/2019 10:08 AM    HGBA1C 5.9 08/18/2015 12:00 AM   MICROALBUR 0.7 02/13/2020 11:45 AM    Last diabetic Eye exam: No results found for: "HMDIABEYEEXA"  Last diabetic Foot exam: No results found for: "HMDIABFOOTEX"   Lab Results  Component Value Date   CHOL 295 (H) 05/11/2022   HDL 99 05/11/2022   LDLCALC 178 (H) 05/11/2022   TRIG 77 05/11/2022   CHOLHDL 3.0 05/11/2022       Latest Ref Rng & Units 05/11/2022   12:18 PM 02/13/2020   11:45 AM 02/11/2019    9:55 AM  Hepatic Function  Total Protein 6.1 - 8.1 g/dL 7.1  7.2  7.6   Albumin 3.5 - 5.0 g/dL   4.4   AST 10 - 35 U/L 15  19  19   $ ALT 6 - 29 U/L 12  14  16   $ Alk Phosphatase 38 - 126 U/L   62   Total Bilirubin 0.2 - 1.2 mg/dL 0.4  0.7  0.8     Lab Results  Component Value Date/Time   TSH 1.98 02/13/2020 11:45 AM   TSH 2.91 08/18/2015 12:00 AM       Latest Ref Rng & Units 05/11/2022   12:18 PM 02/13/2020   11:45 AM 02/11/2019    9:55 AM  CBC  WBC 3.8 - 10.8 Thousand/uL 8.9  8.2  8.8   Hemoglobin 11.7 - 15.5 g/dL 14.3  14.8  14.7   Hematocrit 35.0 - 45.0 % 42.0  44.1  43.3   Platelets 140 - 400 Thousand/uL 331  268  269     Lab Results  Component Value Date/Time   VD25OH 38 05/11/2022 12:18 PM    Clinical ASCVD: No  The 10-year ASCVD risk score (Arnett DK, et al., 2019) is: 16.3%   Values used to calculate the score:     Age: 89 years     Sex: Female     Is Non-Hispanic African American: No     Diabetic: No     Tobacco smoker: No     Systolic Blood Pressure: 0000000 mmHg     Is BP treated: Yes     HDL Cholesterol: 99 mg/dL     Total Cholesterol: 295 mg/dL       05/11/2022   10:45 AM 11/04/2021   11:22 AM 10/28/2021   10:22 AM  Depression screen PHQ 2/9  Decreased Interest 0 0 0  Down, Depressed, Hopeless 0 0 0  PHQ - 2 Score 0 0 0  Altered sleeping 0 0 0  Tired, decreased energy 0 0 3  Change in appetite 0 0 0  Feeling bad or failure about yourself  0 0 0  Trouble concentrating 0 0 0  Moving slowly or fidgety/restless 0 0 0   Suicidal thoughts 0 0 0  PHQ-9 Score 0 0 3  Difficult doing work/chores   Not difficult at all     Social History   Tobacco Use  Smoking Status Former   Packs/day: 0.50   Years: 25.00   Total pack years: 12.50   Types: Cigarettes   Start date: 09/05/1972   Quit date: 08/03/1998   Years since quitting: 24.2  Smokeless Tobacco Never  Tobacco Comments   smoking cessation materials not required   BP Readings from Last 3 Encounters:  05/11/22 138/82  03/18/22 (!) 215/106  11/04/21 138/72   Pulse Readings from Last 3 Encounters:  05/11/22 (!) 106  03/18/22 (!) 107  11/04/21 91   Wt Readings from Last 3 Encounters:  05/11/22 118 lb (53.5 kg)  03/18/22 117 lb 3.2 oz (53.2 kg)  11/04/21 113 lb (51.3 kg)   BMI Readings from Last 3 Encounters:  05/11/22 20.90 kg/m  03/18/22 20.44 kg/m  11/04/21 20.02 kg/m    Allergies  Allergen Reactions   Penicillins Anaphylaxis    Has patient had a PCN reaction causing immediate rash, facial/tongue/throat swelling, SOB or lightheadedness with hypotension: Yes Has patient had a PCN reaction causing severe rash involving mucus membranes or skin necrosis: No Has patient had a PCN reaction that required hospitalization: No Has patient had a PCN reaction occurring within the last 10 years: No If all of the above answers are "NO", then may proceed with Cephalosporin use.    Codeine Nausea And Vomiting   Latex Itching   Sulfa Antibiotics Other (See Comments)    TOPICAL-SULFA CAUSED BLISTERS   Tape Other (See Comments)    Reaction:  Blisters and burning  Pt states that paper tape is okay.     Xopenex [Levalbuterol Hcl] Anxiety    Medications Reviewed Today     Reviewed by [name], MD (Physician) on 05/11/22 at 1137  Med List Status: <None>   Medication Order Taking? Sig Documenting Provider Last Dose Status Informant  albuterol (VENTOLIN HFA) 108 (90 Base) MCG/ACT inhaler [id number] Yes Inhale 2 puffs into the lungs every 6  (six) hours as needed for wheezing or shortness of breath. Patient requests generic Shawna Clamp, MD Taking Active   amLODipine (NORVASC) 5 MG tablet [id number] Yes Take 1  tablet (5 mg total) by mouth daily. [name], MD Taking Active   BREO ELLIPTA 200-25 MCG/ACT AEPB [id number] Yes TAKE 1 PUFF BY MOUTH EVERY DAY [name], MD Taking Active   Cholecalciferol (VITAMIN D3) 250 MCG (10000 UT) TABS [id number] Yes Take 1 tablet by mouth 2 (two) times a week. [provider] Taking Active   diazepam (VALIUM) 5 MG tablet [id number] Yes Take 1 tablet (5 mg total) by mouth daily as needed for anxiety. 30 minutes before doctor's visit [name], MD Taking Active   magnesium 30 MG tablet [id number] Yes Take 30 mg by mouth daily. [provider] Taking Active   montelukast (SINGULAIR) 10 MG tablet [id number] Yes Take 1 tablet (10 mg total) by mouth at bedtime. [name], MD Taking Active   Multiple Vitamin (MULTIVITAMIN) capsule [id number] Yes Take 2 capsules by mouth daily. [provider] Taking Active            Med Note Carlene Coria   Tue Apr 06, 2021 10:49 AM)    OVER THE COUNTER MEDICATION [id number] Yes Take 1 tablet by mouth 2 (two) times daily. Nutri-Calm [provider] Taking Active Self           Med Note Ina Homes Oct 28, 2021 10:18 AM)    OVER THE COUNTER MEDICATION [id number] Yes Life extension bone strength / life extension skin restore ceramide / life extension super bio curcumin [provider] Taking Active   Patient not taking:  Discontinued 05/11/22 1121 (Completed Course)   Wound Cleansers (VASHE WOUND) 0.033 % SOLN [id number] Yes Apply 1 each topically daily at 12 noon. Dillingham, Loel Lofty, DO  Active             SDOH:  (Social Determinants of Health) assessments and interventions performed: Yes SDOH Interventions    Flowsheet Row Clinical Support from 10/28/2021 in [place] [facility] from 05/12/2021 in [place] [facility] from 03/22/2021 in [place] [facility] from 11/05/2020 in [place] [facility] from 07/21/2020 in [place] [facility] from [facility]/16/2020 in [facility]  SDOH Interventions        Food Insecurity Interventions Intervention Not Indicated -- -- -- -- --  Housing Interventions Intervention Not Indicated -- -- -- -- --  Transportation Interventions Intervention Not Indicated -- -- -- Intervention Not Indicated --  Financial Strain Interventions Intervention Not Indicated Other (Comment)  [PAP] -- Intervention Not Indicated Intervention Not Indicated Intervention Not Indicated  Physical Activity Interventions Intervention Not Indicated -- -- -- -- --  Stress Interventions Intervention Not Indicated -- Provide Counseling -- -- --  Social Connections Interventions Intervention Not Indicated -- -- -- -- --       Medication Assistance: None required.  Patient affirms current coverage meets needs.  [facility] Reviewed: Current Rx insurance plan: [employer] Name and location of Current pharmacy:  [facility] - [place], [place] - [address] [place] [address] Phone: [phone] Fax: [phone]  [id number]: [phone]: [phone] Reason patient declined to change pharmacies: Patient is already actively enrolled with [facility]  Compliance/Adherence/Medication fill history: Care Gaps: Shingrix AWV  Mammogram  Star-Rating Drugs: None Noted  Assessment/Plan  Hypertension (BP goal <140/90) -Controlled -Current treatment: Amlodipine 5 mg daily  -Medications  previously tried: Valsartan -Current home readings: Has not routinely  -Current dietary habits: Patient does not follow any specific dietary regimen -Current exercise habits: [employer] occasionally.  -Patient with severe history of white coat hypertension.  -Denies hypotensive/hypertensive symptoms -Recommended to continue current medication  Hyperlipidemia: (LDL goal < 70) -Uncontrolled -Current treatment: None -Medications previously tried: None  -Educated on Benefits of statin for ASCVD risk reduction; patient still not willing to think about starting new treatment at this time. She is working on cutting down her meat consumption and staying active.  -Counseled on diet and exercise extensively  Asthma(Goal: control symptoms and prevent exacerbations) -Uncontrolled -Current treatment  Albuterol HFA   Breo Ellipta 1 puff daily  Montelukast 10 mg nightly  -Medications previously tried: Trelegy ("took my breath away"), Incruse   In the past four weeks, has the patient had:  Daytime asthma symptoms more than twice weekly?   Yes Any instance of night waking due to asthma?   No Utilized albuterol reliever for symptoms more than twice weekly? Yes; 2-3x weekly  Any activity limitation due to asthma?    Yes - needs to rest more often when active.  -Patient with worsening asthma symptoms in response to allergens (pollen, pet dander, mildew). She is utilizing her Breo daily and requiring use of her albuterol more frequently.  -Exacerbations requiring treatment in last 6 months: None -Recommend stepping up therapy to triple therapy.Patient was not interested in retrying Trelegy or trying Breztri at this time.  -Recommend cetirizine for allergy symptoms. Patient declines this at this time. Counseled patient on allergen avoidance, use of masking, etc.   Osteoporosis (Goal improve bone density and prevent fractures) -Uncontrolled -Last DEXA Scan: 10/21/19   T-Score femoral neck:  -3.8  T-Score total hip: NA  T-Score lumbar spine: -3.9  T-Score forearm radius: -3.6  10-year probability of major osteoporotic fracture: NA  10-year probability of hip fracture: NA -Patient is a candidate for pharmacologic treatment due to T-Score < -2.5 in femoral neck and T-Score < -2.5 in lumbar spine -Current treatment  Life extension Ultimate Bone Strength twice daily  Vitamin D 10,000 units twice weekly  -Medications previously tried: NA  -Patient still hesitant to begin osteoporosis treatment. Discussed in details risks of fracture. -Recommend weight-bearing and muscle strengthening exercises for building and maintaining bone density.  -Recommended to continue current medication  Malva Limes, [place] Pharmacist Practitioner  [facility] [phone]

## 2022-10-26 NOTE — Patient Instructions (Signed)
Visit Information It was great speaking with you today!  Please let me know if you have any questions about our visit.  Plan:  We will plan to order Mammogram and Bone Density today.   Print copy of patient instructions, educational materials, and care plan provided in person.   Malva Limes, [place] Pharmacist Practitioner  [facility] [phone]

## 2022-11-01 ENCOUNTER — Ambulatory Visit: Payer: Medicare HMO

## 2022-11-04 ENCOUNTER — Ambulatory Visit (INDEPENDENT_AMBULATORY_CARE_PROVIDER_SITE_OTHER): Payer: Medicare HMO

## 2022-11-04 VITALS — Ht 63.5 in | Wt 118.0 lb

## 2022-11-04 DIAGNOSIS — Z0000 Encounter for general adult medical examination without abnormal findings: Secondary | ICD-10-CM | POA: Diagnosis not present

## 2022-11-04 NOTE — Patient Instructions (Signed)
[name] , Thank you for taking time to come for your Medicare Wellness Visit. I appreciate your ongoing commitment to your health goals. Please review the following plan we discussed and let me know if I can assist you in the future.   These are the goals we discussed:  Goals      DIET - INCREASE WATER INTAKE     Recommend to drink at least 6-8 8oz glasses of water per day.     Find Help in My Community     Timeframe:  Long-Range Goal Priority:  High Start Date:    03/22/21                         Expected End Date:   10/22/20                  Follow Up Date 06/04/21  - follow-up on any referrals for help I am given - think ahead to make sure my need does not become an emergency - have a back-up plan - make a list of family or friends that I can call     Why is this important?   Knowing how and where to find help for yourself or family in your neighborhood and community is an important skill.  You will want to take some steps to learn how.    Notes:      Increase physical activity     Patient would like to implement a physical activity routine for strength in right leg and "feel alive again"     Manage My Medicine     Timeframe:  Long-Range Goal Priority:  High Start Date:   11/05/2020                          Expected End Date:  05/13/2022                     Follow Up Date 07/2021    - call for medicine refill 2 or 3 days before it runs out - keep a list of all the medicines I take; vitamins and herbals too - use a pillbox to sort medicine    Why is this important?   These steps will help you keep on track with your medicines.   Notes:         This is a list of the screening recommended for you and due dates:  Health Maintenance  Topic Date Due   Pneumonia Vaccine (1 of 2 - PCV) Never done   Zoster (Shingles) Vaccine (1 of 2) Never done   Mammogram  10/20/2021   Colon Cancer Screening  11/05/2022*   Medicare Annual Wellness Visit  11/04/2023   DTaP/Tdap/Td  vaccine (2 - Td or Tdap) 08/08/2026   DEXA scan (bone density measurement)  Completed   Hepatitis C Screening: USPSTF Recommendation to screen - Ages 55-79 yo.  Completed   HPV Vaccine  Aged Out   Flu Shot  Discontinued   COVID-19 Vaccine  Discontinued  *Topic was postponed. The date shown is not the original due date.    Advanced directives: yes  Conditions/risks identified: none  Next appointment: Follow up in one year for your annual wellness visit 11/09/2023 '@11'$ :30am   Preventive Care 65 Years and Older, Female Preventive care refers to lifestyle choices and visits with your health care provider that can promote health and wellness. What does  preventive care include? A yearly physical exam. This is also called an annual well check. Dental exams once or twice a year. Routine eye exams. Ask your health care provider how often you should have your eyes checked. Personal lifestyle choices, including: Daily care of your teeth and gums. Regular physical activity. Eating a healthy diet. Avoiding tobacco and drug use. Limiting alcohol use. Practicing safe sex. Taking low-dose aspirin every day. Taking vitamin and mineral supplements as recommended by your health care provider. What happens during an annual well check? The services and screenings done by your health care provider during your annual well check will depend on your age, overall health, lifestyle risk factors, and family history of disease. Counseling  Your health care provider may ask you questions about your: Alcohol use. Tobacco use. Drug use. Emotional well-being. Home and relationship well-being. Sexual activity. Eating habits. History of falls. Memory and ability to understand (cognition). Work and work Statistician. Reproductive health. Screening  You may have the following tests or measurements: Height, weight, and BMI. Blood pressure. Lipid and cholesterol levels. These may be checked every 5 years,  or more frequently if you are over 15 years old. Skin check. Lung cancer screening. You may have this screening every year starting at age 98 if you have a 30-pack-year history of smoking and currently smoke or have quit within the past 15 years. Fecal occult blood test (FOBT) of the stool. You may have this test every year starting at age 57. Flexible sigmoidoscopy or colonoscopy. You may have a sigmoidoscopy every 5 years or a colonoscopy every 10 years starting at age 27. Hepatitis C blood test. Hepatitis B blood test. Sexually transmitted disease (STD) testing. Diabetes screening. This is done by checking your blood sugar (glucose) after you have not eaten for a while (fasting). You may have this done every 1-3 years. Bone density scan. This is done to screen for osteoporosis. You may have this done starting at age 20. Mammogram. This may be done every 1-2 years. Talk to your health care provider about how often you should have regular mammograms. Talk with your health care provider about your test results, treatment options, and if necessary, the need for more tests. Vaccines  Your health care provider may recommend certain vaccines, such as: Influenza vaccine. This is recommended every year. Tetanus, diphtheria, and acellular pertussis (Tdap, Td) vaccine. You may need a Td booster every 10 years. Zoster vaccine. You may need this after age 49. Pneumococcal 13-valent conjugate (PCV13) vaccine. One dose is recommended after age 24. Pneumococcal polysaccharide (PPSV23) vaccine. One dose is recommended after age 24. Talk to your health care provider about which screenings and vaccines you need and how often you need them. This information is not intended to replace advice given to you by your health care provider. Make sure you discuss any questions you have with your health care provider. Document Released: 09/18/2015 Document Revised: 05/11/2016 Document Reviewed: 06/23/2015 Elsevier  Interactive Patient Education  2017 [place] Prevention in the Home Falls can cause injuries. They can happen to people of all ages. There are many things you can do to make your home safe and to help prevent falls. What can I do on the outside of my home? Regularly fix the edges of walkways and driveways and fix any cracks. Remove anything that might make you trip as you walk through a door, such as a raised step or threshold. Trim any bushes or trees on the path to  your home. Use bright outdoor lighting. Clear any walking paths of anything that might make someone trip, such as rocks or tools. Regularly check to see if handrails are loose or broken. Make sure that both sides of any steps have handrails. Any raised decks and porches should have guardrails on the edges. Have any leaves, snow, or ice cleared regularly. Use sand or salt on walking paths during winter. Clean up any spills in your garage right away. This includes oil or grease spills. What can I do in the bathroom? Use night lights. Install grab bars by the toilet and in the tub and shower. Do not use towel bars as grab bars. Use non-skid mats or decals in the tub or shower. If you need to sit down in the shower, use a plastic, non-slip stool. Keep the floor dry. Clean up any water that spills on the floor as soon as it happens. Remove soap buildup in the tub or shower regularly. Attach bath mats securely with double-sided non-slip rug tape. Do not have throw rugs and other things on the floor that can make you trip. What can I do in the bedroom? Use night lights. Make sure that you have a light by your bed that is easy to reach. Do not use any sheets or blankets that are too big for your bed. They should not hang down onto the floor. Have a firm chair that has side arms. You can use this for support while you get dressed. Do not have throw rugs and other things on the floor that can make you trip. What can I do  in the kitchen? Clean up any spills right away. Avoid walking on wet floors. Keep items that you use a lot in easy-to-reach places. If you need to reach something above you, use a strong step stool that has a grab bar. Keep electrical cords out of the way. Do not use floor polish or wax that makes floors slippery. If you must use wax, use non-skid floor wax. Do not have throw rugs and other things on the floor that can make you trip. What can I do with my stairs? Do not leave any items on the stairs. Make sure that there are handrails on both sides of the stairs and use them. Fix handrails that are broken or loose. Make sure that handrails are as long as the stairways. Check any carpeting to make sure that it is firmly attached to the stairs. Fix any carpet that is loose or worn. Avoid having throw rugs at the top or bottom of the stairs. If you do have throw rugs, attach them to the floor with carpet tape. Make sure that you have a light switch at the top of the stairs and the bottom of the stairs. If you do not have them, ask someone to add them for you. What else can I do to help prevent falls? Wear shoes that: Do not have high heels. Have rubber bottoms. Are comfortable and fit you well. Are closed at the toe. Do not wear sandals. If you use a stepladder: Make sure that it is fully opened. Do not climb a closed stepladder. Make sure that both sides of the stepladder are locked into place. Ask someone to hold it for you, if possible. Clearly mark and make sure that you can see: Any grab bars or handrails. First and last steps. Where the edge of each step is. Use tools that help you move around (mobility aids) if they  are needed. These include: Canes. Walkers. Scooters. Crutches. Turn on the lights when you go into a dark area. Replace any light bulbs as soon as they burn out. Set up your furniture so you have a clear path. Avoid moving your furniture around. If any of your  floors are uneven, fix them. If there are any pets around you, be aware of where they are. Review your medicines with your doctor. Some medicines can make you feel dizzy. This can increase your chance of falling. Ask your doctor what other things that you can do to help prevent falls. This information is not intended to replace advice given to you by your health care provider. Make sure you discuss any questions you have with your health care provider. Document Released: 06/18/2009 Document Revised: 01/28/2016 Document Reviewed: 09/26/2014 Elsevier Interactive Patient Education  2017 [employer].

## 2022-11-04 NOTE — Progress Notes (Signed)
I connected with  Jacqlyn Krauss on 11/04/22 by a audio enabled telemedicine application and verified that I am speaking with the correct person using two identifiers.  Patient Location: Home  Provider Location: Office/Clinic  I discussed the limitations of evaluation and management by telemedicine. The patient expressed understanding and agreed to proceed.  Subjective:   Patricia Galloway is a 72 y.o. female who presents for Medicare Annual (Subsequent) preventive examination.  Review of Systems    Cardiac Risk Factors include: advanced age (>29mn, >>63women);dyslipidemia    Objective:    Today's Vitals   11/04/22 1012  Weight: 118 lb (53.5 kg)  Height: 5' 3.5" (1.613 m)   Body mass index is 20.57 kg/m.     11/04/2022   10:33 AM 10/28/2021   10:25 AM 10/26/2021   10:31 AM 09/02/2021    2:04 PM 12/22/2020    1:23 PM 11/10/2020    1:53 PM 08/18/2020    1:47 PM  Advanced Directives  Does Patient Have a Medical Advance Directive? Yes Yes Yes Yes Yes Yes Yes  Type of ACorporate treasurerof ABowmanLiving will HStuartLiving will HGreenwoodLiving will HBurtonLiving will  HSharkeyLiving will  Does patient want to make changes to medical advance directive?    Yes (ED - Information included in AVS)     Copy of [place] Chart?  Yes - validated most recent copy scanned in chart (See row information)     Yes - validated most recent copy scanned in chart (See row information)    Current Medications (verified) Outpatient Encounter Medications as of 11/04/2022  Medication Sig   albuterol (VENTOLIN HFA) 108 (90 Base) MCG/ACT inhaler Inhale 2 puffs into the lungs every 6 (six) hours as needed for wheezing or shortness of breath. Patient requests generic Ventolin   amLODipine (NORVASC) 5 MG tablet TAKE 1 TABLET (5 MG TOTAL) BY MOUTH DAILY.   benzonatate (TESSALON) 100 MG  capsule Take 1-2 capsules (100-200 mg total) by mouth 2 (two) times daily as needed.   Cholecalciferol (VITAMIN D3) 250 MCG (10000 UT) TABS Take 1 tablet by mouth 2 (two) times a week.   diazepam (VALIUM) 5 MG tablet Take 1 tablet (5 mg total) by mouth daily as needed for anxiety. 30 minutes before doctor's visit   fluticasone furoate-vilanterol (BREO ELLIPTA) 200-25 MCG/ACT AEPB Inhale 1 puff into the lungs daily.   magnesium 30 MG tablet Take 30 mg by mouth daily.   montelukast (SINGULAIR) 10 MG tablet Take 1 tablet (10 mg total) by mouth at bedtime.   Multiple Vitamin (MULTIVITAMIN) capsule Take 2 capsules by mouth daily.   OVER THE COUNTER MEDICATION Take 1 tablet by mouth 2 (two) times daily. Nutri-Calm   OVER THE COUNTER MEDICATION Life extension bone strength / life extension skin restore ceramide / life extension super bio curcumin   Wound Cleansers (VASHE WOUND) 0.033 % SOLN Apply 1 each topically daily at 12 noon. (Patient not taking: Reported on 11/04/2022)   No facility-administered encounter medications on file as of 11/04/2022.    Allergies (verified) Penicillins, Codeine, Latex, Sulfa antibiotics, Tape, and Xopenex [levalbuterol hcl]   History: Past Medical History:  Diagnosis Date   Anemia    has had iron infusion   Anxiety    complicated grief   [place]    found on CT in December, 2017   Asthma    as child ,  but no episodes in over 30 years   Basal cell carcinoma    Cancer ([place]) 08/2016   basal cell carcinoma - right leg   History of transfusion of packed red blood cells    Personal history of chemotherapy 2018   basal cell    Past Surgical History:  Procedure Laterality Date   BASAL CELL CARCINOMA EXCISION Right 03/29/2017   Procedure: EXCISION OF RIGHT LEG BASEL CELL CARCINOMA;  Surgeon: [name], DO;  Location: [facility];  Service: Plastics;  Laterality: Right;   COLON SURGERY  2002   colostomy for 10 months after a perfurated sigmoid     COLOSTOMY REVERSAL  2003   EMBOLIZATION Right 10/12/2016   Procedure: Embolization;  Surgeon: [name], MD;  Location: [facility];  Service: Cardiovascular;  Laterality: Right;   etopic  [id number]   INCISION AND DRAINAGE OF WOUND Right 03/30/2017   Procedure: IRRIGATION AND DEBRIDEMENT WOUND; VAC PLACEMENT;  Surgeon: [name], DO;  Location: [facility];  Service: Plastics;  Laterality: Right;   TONSILLECTOMY     Family History  Problem Relation Age of Onset   Dementia Mother    Aortic stenosis Mother    Osteoporosis Mother    Dementia Father    Diabetes Father    Hyperlipidemia Father    Hypertension Father    Aneurysm Sister    Hyperlipidemia Sister    Hypertension Sister    Diabetes Sister    Breast cancer Neg Hx    Social History   Socioeconomic History   Marital status: Married    Spouse name: John   Number of children: 0   Years of education: Not on file   Highest education level: Bachelor's degree (e.g., BA, AB, BS)  Occupational History   Occupation: Retired  Tobacco Use   Smoking status: Former    Packs/day: 0.50    Years: 25.00    Total pack years: 12.50    Types: Cigarettes    Start date: 09/05/1972    Quit date: 08/03/1998    Years since quitting: 24.2   Smokeless tobacco: Never   Tobacco comments:    smoking cessation materials not required  Vaping Use   Vaping Use: Never used  Substance and Sexual Activity   Alcohol use: No    Alcohol/week: 0.0 standard drinks of alcohol   Drug use: No   Sexual activity: Not Currently    Birth control/protection: Post-menopausal  Other Topics Concern   Not on file  Social History Narrative   Not on file   Social Determinants of Health   Financial Resource Strain: Low Risk  (11/04/2022)   Overall Financial Resource Strain (CARDIA)    Difficulty of Paying Living Expenses: Not hard at all  Food Insecurity: No Food Insecurity (11/04/2022)   Hunger Vital Sign    Worried About Running Out of  Food in the Last Year: Never true    Ran Out of Food in the Last Year: Never true  Transportation Needs: No Transportation Needs (11/04/2022)   PRAPARE - Hydrologist (Medical): No    Lack of Transportation (Non-Medical): No  Physical Activity: Inactive (11/04/2022)   Exercise Vital Sign    Days of Exercise per Week: 0 days    Minutes of Exercise per Session: 0 min  Stress: Stress Concern Present (11/04/2022)   [place]    Feeling of Stress : To some extent  Social Connections: Moderately Isolated (11/04/2022)   Social Connection and Isolation Panel [NHANES]    Frequency of Communication with Friends and Family: More than three times a week    Frequency of Social Gatherings with Friends and Family: Never    Attends Religious Services: Never    Marine scientist or Organizations: No    Attends Music therapist: Never    Marital Status: Married    Tobacco Counseling Counseling given: Not Answered Tobacco comments: smoking cessation materials not required   Clinical Intake:  Pre-visit preparation completed: Yes  Pain : No/denies pain     BMI - recorded: 20.57 Nutritional Status: BMI of 19-24  Normal Nutritional Risks: None Diabetes: No  How often do you need to have someone help you when you read instructions, pamphlets, or other written materials from your doctor or pharmacy?: 1 - Never  Diabetic?no  Interpreter Needed?: No  Information entered by :: B.Kimble Delaurentis,LPN   Activities of Daily Living    11/04/2022   12:24 PM 05/11/2022   10:45 AM  In your present state of health, do you have any difficulty performing the following activities:  Hearing? 0 0  Vision? 0 0  Difficulty concentrating or making decisions? 0 0  Walking or climbing stairs? 0 0  Dressing or bathing? 0 0  Doing errands, shopping? 0 0  Preparing Food and eating ? N   Using the Toilet? N    In the past six months, have you accidently leaked urine? N   Do you have problems with loss of bowel control? N   Managing your Medications? N   Managing your Finances? N   Housekeeping or managing your Housekeeping? N     Patient Care Team: [name], MD as PCP - General (Family Medicine) Lloyd Huger, MD as Consulting Physician (Oncology) Dillingham, Loel Lofty, DO as Attending Physician (Plastic Surgery) Germaine Pomfret, [facility] as Pharmacist (Pharmacist)  Indicate any recent Medical Services you may have received from other than [facility] providers in the past year (date may be approximate).     Assessment:   This is a routine wellness examination for Vidhi.  Hearing/Vision screen Hearing Screening - Comments:: Adequate hearing Vision Screening - Comments:: Adequate vision;only readers Dr Mordecai Rasmussen  Dietary issues and exercise activities discussed: Current Exercise Habits: The patient does not participate in regular exercise at present, Exercise limited by: orthopedic condition(s)   Goals Addressed             This Visit's Progress    Increase physical activity   Not on track    Patient would like to implement a physical activity routine for strength in right leg and "feel alive again"     Manage My Medicine   On track    Timeframe:  Long-Range Goal Priority:  High Start Date:   11/05/2020                          Expected End Date:  05/13/2022                     Follow Up Date 07/2021    - call for medicine refill 2 or 3 days before it runs out - keep a list of all the medicines I take; vitamins and herbals too - use a pillbox to sort medicine    Why is this important?   These steps will help you keep on track with your  medicines.   Notes:        Depression Screen    11/04/2022   10:27 AM 05/11/2022   10:45 AM 11/04/2021   11:22 AM 10/28/2021   10:22 AM 10/08/2021    9:01 AM 04/06/2021   10:50 AM 03/22/2021   12:26 PM  PHQ 2/9 Scores  PHQ - 2  Score 0 0 0 0 0 0 0  PHQ- 9 Score  0 0 3 0 3     Fall Risk    11/04/2022   10:18 AM 05/11/2022   10:45 AM 11/04/2021   11:21 AM 10/28/2021   10:26 AM 10/08/2021    9:01 AM  Fall Risk   Falls in the past year? 0 0 0 0 0  Number falls in past yr: 0 0 0 0 0  Injury with Fall? 0 0 0 0 0  Risk for fall due to : No Fall Risks No Fall Risks No Fall Risks No Fall Risks No Fall Risks  Follow up Education provided;Falls prevention discussed Falls prevention discussed Falls prevention discussed Falls prevention discussed Falls prevention discussed    FALL RISK PREVENTION PERTAINING TO THE HOME:  Any stairs in or around the home? Yes  If so, are there any without handrails? Yes  Home free of loose throw rugs in walkways, pet beds, electrical cords, etc? Yes  Adequate lighting in your home to reduce risk of falls? Yes   ASSISTIVE DEVICES UTILIZED TO PREVENT FALLS:  Life alert? No  Use of a cane, walker or w/c? No  Grab bars in the bathroom? Yes  Shower chair or bench in shower? Yes  Elevated toilet seat or a handicapped toilet? Yes    Cognitive Function:        11/04/2022   10:33 AM 12/26/2017    9:13 AM  6CIT Screen  What Year? 0 points 0 points  What month? 0 points 0 points  What time? 0 points 0 points  Count back from 20 0 points 0 points  Months in reverse 0 points 0 points  Repeat phrase 0 points 0 points  Total Score 0 points 0 points    Immunizations Immunization History  Administered Date(s) Administered   Tdap 08/08/2016    TDAP status: Up to date  Flu Vaccine status: Declined, Education has been provided regarding the importance of this vaccine but patient still declined. Advised may receive this vaccine at local pharmacy or Health Dept. Aware to provide a copy of the vaccination record if obtained from local pharmacy or Health Dept. Verbalized acceptance and understanding.  Pneumococcal vaccine status: Declined,  Education has been provided regarding the importance  of this vaccine but patient still declined. Advised may receive this vaccine at local pharmacy or Health Dept. Aware to provide a copy of the vaccination record if obtained from local pharmacy or Health Dept. Verbalized acceptance and understanding.   Covid-19 vaccine status: Declined, Education has been provided regarding the importance of this vaccine but patient still declined. Advised may receive this vaccine at local pharmacy or Health Dept.or vaccine clinic. Aware to provide a copy of the vaccination record if obtained from local pharmacy or Health Dept. Verbalized acceptance and understanding.  Qualifies for Shingles Vaccine? Yes   Zostavax completed No   Shingrix Completed?: No.    Education has been provided regarding the importance of this vaccine. Patient has been advised to call insurance company to determine out of pocket expense if they have not yet received this vaccine. Advised  may also receive vaccine at local pharmacy or Health Dept. Verbalized acceptance and understanding.  Screening Tests Health Maintenance  Topic Date Due   Pneumonia Vaccine 48+ Years old (1 of 2 - PCV) Never done   Zoster Vaccines- Shingrix (1 of 2) Never done   MAMMOGRAM  10/20/2021   COLONOSCOPY (Pts 45-81yr Insurance coverage will need to be confirmed)  11/05/2022 (Originally 07/27/1996)   Medicare Annual Wellness (AWV)  11/04/2023   DTaP/Tdap/Td (2 - Td or Tdap) 08/08/2026   DEXA SCAN  Completed   Hepatitis C Screening  Completed   HPV VACCINES  Aged Out   INFLUENZA VACCINE  Discontinued   COVID-19 Vaccine  Discontinued    Health Maintenance  Health Maintenance Due  Topic Date Due   Pneumonia Vaccine 72 Years old (1 of 2 - PCV) Never done   Zoster Vaccines- Shingrix (1 of 2) Never done   MAMMOGRAM  10/20/2021    Colorectal cancer screening: Type of screening: Colonoscopy. Completed 5. Repeat every - yearsPt does not do the colonoscopy:does cologuard  Mammogram status: Ordered yes. Pt  provided with contact info and advised to call to schedule appt.   Bone Density status: Ordered 5. Pt provided with contact info and advised to call to schedule appt.  Lung Cancer Screening: (Low Dose CT Chest recommended if Age 72-80years, 30 pack-year currently smoking OR have quit w/in 15years.) does not qualify.   Lung Cancer Screening Referral: no  Additional Screening:  Hepatitis C Screening: does not qualify; Completed no  Vision Screening: Recommended annual ophthalmology exams for early detection of glaucoma and other disorders of the eye. Is the patient up to date with their annual eye exam?  NO Who is the provider or what is the name of the office in which the patient attends annual eye exams? Will go to [facility] If pt is not established with a provider, would they like to be referred to a provider to establish care? No .   Dental Screening: Recommended annual dental exams for proper oral hygiene  Community Resource Referral / Chronic Care Management: CRR required this visit?  No   CCM required this visit?  No      Plan:     I have personally reviewed and noted the following in the patient's chart:   Medical and social history Use of alcohol, tobacco or illicit drugs  Current medications and supplements including opioid prescriptions. Patient is not currently taking opioid prescriptions. Functional ability and status Nutritional status Physical activity Advanced directives List of other physicians Hospitalizations, surgeries, and ER visits in previous 12 months Vitals Screenings to include cognitive, depression, and falls Referrals and appointments  In addition, I have reviewed and discussed with patient certain preventive protocols, quality metrics, and best practice recommendations. A written personalized care plan for preventive services as well as general preventive health recommendations were provided to patient.     BR[facility]er Shelter LPN   [id number]   Nurse Notes: pt is doing better and managing "the best she can" after leg surgeries, caring for her husband, and loss of her sister. She denies any needs, concerns or questions at this time.

## 2022-11-09 ENCOUNTER — Ambulatory Visit: Payer: Medicare HMO | Admitting: Family Medicine

## 2022-11-23 ENCOUNTER — Telehealth: Payer: Self-pay

## 2022-11-23 NOTE — Progress Notes (Signed)
Care Management & Coordination Services Pharmacy Team Pharmacy Assistant   Name: Cheryle Corker  MRN: [id number] DOB: 1951/07/07  Reason for Encounter: Medication Coordination and Delivery for [facility]  Contacted patient to discuss medications and coordinate delivery from [place]. {US HC Outreach:[id number]}  Chart review: Recent office visits:  11/04/2022 Laqueta Jean, LPN (Clinical Support Visit) for Medicare Wellness Exam- No medication changes noted, No orders placed,   Recent consult visits:  None ID  Hospital visits:  None in previous 6 months  Medications: Outpatient Encounter Medications as of 11/23/2022  Medication Sig   albuterol (VENTOLIN HFA) 108 (90 Base) MCG/ACT inhaler Inhale 2 puffs into the lungs every 6 (six) hours as needed for wheezing or shortness of breath. Patient requests generic Ventolin   amLODipine (NORVASC) 5 MG tablet TAKE 1 TABLET (5 MG TOTAL) BY MOUTH DAILY.   benzonatate (TESSALON) 100 MG capsule Take 1-2 capsules (100-200 mg total) by mouth 2 (two) times daily as needed.   Cholecalciferol (VITAMIN D3) 250 MCG (10000 UT) TABS Take 1 tablet by mouth 2 (two) times a week.   diazepam (VALIUM) 5 MG tablet Take 1 tablet (5 mg total) by mouth daily as needed for anxiety. 30 minutes before doctor's visit   fluticasone furoate-vilanterol (BREO ELLIPTA) 200-25 MCG/ACT AEPB Inhale 1 puff into the lungs daily.   magnesium 30 MG tablet Take 30 mg by mouth daily.   montelukast (SINGULAIR) 10 MG tablet Take 1 tablet (10 mg total) by mouth at bedtime.   Multiple Vitamin (MULTIVITAMIN) capsule Take 2 capsules by mouth daily.   OVER THE COUNTER MEDICATION Take 1 tablet by mouth 2 (two) times daily. Nutri-Calm   OVER THE COUNTER MEDICATION Life extension bone strength / life extension skin restore ceramide / life extension super bio curcumin   Wound Cleansers (VASHE WOUND) 0.033 % SOLN Apply 1 each topically daily at 12 noon. (Patient not taking:  Reported on 11/04/2022)   No facility-administered encounter medications on file as of 11/23/2022.   BP Readings from Last 3 Encounters:  05/11/22 138/82  03/18/22 (!) 215/106  11/04/21 138/72    Pulse Readings from Last 3 Encounters:  05/11/22 (!) 106  03/18/22 (!) 107  11/04/21 91    Lab Results  Component Value Date/Time   HGBA1C 5.2 02/13/2020 11:45 AM   HGBA1C 5.3 02/12/2019 10:08 AM   HGBA1C 5.9 08/18/2015 12:00 AM   Lab Results  Component Value Date   CREATININE 0.70 05/11/2022   BUN 14 05/11/2022   GFRNONAA 81 02/13/2020   GFRAA 93 02/13/2020   NA 136 05/11/2022   K 4.0 05/11/2022   CALCIUM 9.6 05/11/2022   CO2 25 05/11/2022   Cycle dispensing form sent to [name], CPP for review.   Last adherence delivery date:   11/04/2022  Patient is due for next adherence delivery on: 12/05/2022 1st  Route  This delivery to include: Vials  30 Days  Amlodipine 5 mg 1 tablet  Montelukast 10 mg 1 tablet daily at bed time Breo Inhaler 200-25 mcg 1 puff daily  Patient declined the following medications this month: Ventolin Inhaler 108 mcg-PRN medication patient has a sufficient supply     Refills requested from providers include: Amlodipine 5 mg in which [facility] can send refill in for the patient  Confirmed delivery date of 12/05/2022 1st Route, advised patient that pharmacy will contact them the morning of delivery.  Any concerns about your medications? No  How often do you forget or accidentally miss a  dose? Never  Do you use a pillbox? No  Is patient in packaging No  [name], [place] Pharmacist Assistant Phone: [phone]

## 2022-12-21 ENCOUNTER — Other Ambulatory Visit: Payer: Self-pay | Admitting: Family Medicine

## 2022-12-21 DIAGNOSIS — J432 Centrilobular emphysema: Secondary | ICD-10-CM

## 2022-12-21 DIAGNOSIS — J4540 Moderate persistent asthma, uncomplicated: Secondary | ICD-10-CM

## 2022-12-22 ENCOUNTER — Telehealth: Payer: Self-pay

## 2022-12-22 NOTE — Progress Notes (Signed)
Care Management & Coordination Services Pharmacy Team Pharmacy Assistant   Name: Patricia Galloway  MRN: [id number] DOB: [birth year]  Reason for Encounter: Medication Coordination and Delivery for [facility]  Contacted patient to discuss medications and coordinate delivery from [facility]. Spoke with patient on 12/22/2022   Chart review: Recent office visits:  None ID  Recent consult visits:  None ID  Hospital visits:  None in previous 6 months  Medications: Outpatient Encounter Medications as of 12/22/2022  Medication Sig   albuterol (VENTOLIN HFA) 108 (90 Base) MCG/ACT inhaler Inhale 2 puffs into the lungs every 6 (six) hours as needed for wheezing or shortness of breath. Patient requests generic Ventolin   amLODipine (NORVASC) 5 MG tablet TAKE 1 TABLET (5 MG TOTAL) BY MOUTH DAILY.   benzonatate (TESSALON) 100 MG capsule Take 1-2 capsules (100-200 mg total) by mouth 2 (two) times daily as needed.   Cholecalciferol (VITAMIN D3) 250 MCG (10000 UT) TABS Take 1 tablet by mouth 2 (two) times a week.   diazepam (VALIUM) 5 MG tablet Take 1 tablet (5 mg total) by mouth daily as needed for anxiety. 30 minutes before doctor's visit   fluticasone furoate-vilanterol (BREO ELLIPTA) 200-25 MCG/ACT AEPB Inhale 1 puff into the lungs daily.   magnesium 30 MG tablet Take 30 mg by mouth daily.   montelukast (SINGULAIR) 10 MG tablet TAKE ONE TABLET BY MOUTH EVERYDAY AT BEDTIME   Multiple Vitamin (MULTIVITAMIN) capsule Take 2 capsules by mouth daily.   OVER THE COUNTER MEDICATION Take 1 tablet by mouth 2 (two) times daily. Nutri-Calm   OVER THE COUNTER MEDICATION Life extension bone strength / life extension skin restore ceramide / life extension super bio curcumin   Wound Cleansers (VASHE WOUND) 0.033 % SOLN Apply 1 each topically daily at 12 noon. (Patient not taking: Reported on 11/04/2022)   No facility-administered encounter medications on file as of 12/22/2022.   BP Readings from Last 3  Encounters:  05/11/22 138/82  03/18/22 (!) 215/106  11/04/21 138/72    Pulse Readings from Last 3 Encounters:  05/11/22 (!) 106  03/18/22 (!) 107  11/04/21 91    Lab Results  Component Value Date/Time   HGBA1C 5.2 02/13/2020 11:45 AM   HGBA1C 5.3 02/12/2019 10:08 AM   HGBA1C 5.9 08/18/2015 12:00 AM   Lab Results  Component Value Date   CREATININE 0.70 05/11/2022   BUN 14 05/11/2022   GFRNONAA 81 02/13/2020   GFRAA 93 02/13/2020   NA 136 05/11/2022   K 4.0 05/11/2022   CALCIUM 9.6 05/11/2022   CO2 25 05/11/2022   Cycle dispensing form sent to Angelena Sole, CPP for review.   Last adherence delivery date:  12/05/2022 1st Route  Patient is due for next adherence delivery on: 01/04/2023 2nd route. Request sent to change to 1st Route per patient's preference  This delivery to include: Vials  30 Days  Amlodipine 5 mg 1 tablet  Montelukast 10 mg 1 tablet daily at bed time Breo Inhaler 200-25 mcg 1 puff daily  Patient declined the following medications this month: Ventolin Inhaler 108 mcg-PRN medication patient has a sufficient supply   Refills requested from providers include:  Amlodipine 5 mg which is a PCP medication that [facility] can send over refill for.  Confirmed delivery date of 01/04/2023 1st Route, advised patient that pharmacy will contact them the morning of delivery.  Any concerns about your medications? No  How often do you forget or accidentally miss a dose? Never  Do you  use a pillbox? No  Is patient in packaging No  I spoke to the patient and for the most part she is doing well. Patient does report that the pollen is aggravating her asthma. She denies any congestion or sinus issues. Per patient the pollen is affecting her chest area. Patient is using her Breo Inhaler daily as directed and her Ventolin rescue inhaler as needed. Patient has no concerns or issues at this time,   Patient has a follow-up with [facility] on 04/26/2023 @ 1030  Patricia Galloway,  CPA/CMA Programme researcher, broadcasting/film/video Phone: [phone]

## 2023-01-02 ENCOUNTER — Other Ambulatory Visit: Payer: Self-pay | Admitting: Family Medicine

## 2023-01-02 DIAGNOSIS — I10 Essential (primary) hypertension: Secondary | ICD-10-CM

## 2023-01-06 NOTE — Progress Notes (Signed)
Provider was running behind and she had to leave

## 2023-01-09 ENCOUNTER — Encounter: Payer: Self-pay | Admitting: Family Medicine

## 2023-01-09 ENCOUNTER — Ambulatory Visit (INDEPENDENT_AMBULATORY_CARE_PROVIDER_SITE_OTHER): Payer: Medicare HMO | Admitting: Family Medicine

## 2023-01-09 VITALS — BP 134/86 | HR 118 | Resp 16 | Ht 63.0 in | Wt 112.0 lb

## 2023-01-09 DIAGNOSIS — Z1231 Encounter for screening mammogram for malignant neoplasm of breast: Secondary | ICD-10-CM

## 2023-01-22 ENCOUNTER — Other Ambulatory Visit: Payer: Self-pay | Admitting: Family Medicine

## 2023-01-22 DIAGNOSIS — J4540 Moderate persistent asthma, uncomplicated: Secondary | ICD-10-CM

## 2023-01-22 DIAGNOSIS — J432 Centrilobular emphysema: Secondary | ICD-10-CM

## 2023-01-23 ENCOUNTER — Telehealth: Payer: Self-pay

## 2023-01-23 NOTE — Progress Notes (Addendum)
Care Management & Coordination Services Pharmacy Team Pharmacy Assistant   Name: Patricia Galloway  MRN: [id number] DOB: [birth year]  Reason for Encounter: Medication Coordination and Delivery for [facility]  Contacted patient to discuss medications and coordinate delivery from [facility]. Spoke with patient on 01/23/2023   Chart review: Recent office visits:  None ID  Recent consult visits:  None ID  Hospital visits:  None in previous 6 months  Medications: Outpatient Encounter Medications as of 01/23/2023  Medication Sig   albuterol (VENTOLIN HFA) 108 (90 Base) MCG/ACT inhaler Inhale 2 puffs into the lungs every 6 (six) hours as needed for wheezing or shortness of breath. Patient requests generic Ventolin   amLODipine (NORVASC) 5 MG tablet TAKE ONE TABLET BY MOUTH ONCE DAILY   benzonatate (TESSALON) 100 MG capsule Take 1-2 capsules (100-200 mg total) by mouth 2 (two) times daily as needed.   Cholecalciferol (VITAMIN D3) 250 MCG (10000 UT) TABS Take 1 tablet by mouth 2 (two) times a week.   diazepam (VALIUM) 5 MG tablet Take 1 tablet (5 mg total) by mouth daily as needed for anxiety. 30 minutes before doctor's visit   fluticasone furoate-vilanterol (BREO ELLIPTA) 200-25 MCG/ACT AEPB Inhale 1 puff into the lungs daily.   magnesium 30 MG tablet Take 30 mg by mouth daily.   montelukast (SINGULAIR) 10 MG tablet TAKE ONE TABLET BY MOUTH EVERYDAY AT BEDTIME   Multiple Vitamin (MULTIVITAMIN) capsule Take 2 capsules by mouth daily.   OVER THE COUNTER MEDICATION Take 1 tablet by mouth 2 (two) times daily. Nutri-Calm   OVER THE COUNTER MEDICATION Life extension bone strength / life extension skin restore ceramide / life extension super bio curcumin   Wound Cleansers (VASHE WOUND) 0.033 % SOLN Apply 1 each topically daily at 12 noon.   No facility-administered encounter medications on file as of 01/23/2023.   BP Readings from Last 3 Encounters:  01/09/23 134/86  05/11/22 138/82   03/18/22 (!) 215/106    Pulse Readings from Last 3 Encounters:  01/09/23 (!) 118  05/11/22 (!) 106  03/18/22 (!) 107    Lab Results  Component Value Date/Time   HGBA1C 5.2 02/13/2020 11:45 AM   HGBA1C 5.3 02/12/2019 10:08 AM   HGBA1C 5.9 08/18/2015 12:00 AM   Lab Results  Component Value Date   CREATININE 0.70 05/11/2022   BUN 14 05/11/2022   GFRNONAA 81 02/13/2020   GFRAA 93 02/13/2020   NA 136 05/11/2022   K 4.0 05/11/2022   CALCIUM 9.6 05/11/2022   CO2 25 05/11/2022   Cycle dispensing form sent to Angelena Sole, [facility] for review.   Last adherence delivery date:    05/01/204 1st Route   Patient is due for next adherence delivery on: 02/02/2023 1st Route  This delivery to include: Vials  30 Days  Amlodipine 5 mg 1 tablet  Montelukast 10 mg 1 tablet daily at bed time Breo Inhaler 200-25 mcg 1 puff daily  Patient declined the following medications this month: Ventolin Inhaler 108 mcg-PRN medication patient has a sufficient supply   Refills requested from providers include:  Breo Inhaler this is a PCP medication that [facility] can send refill for  Confirmed delivery date of 02/02/2023 1st route, advised patient that pharmacy will contact them the morning of delivery.  Any concerns about your medications? No  How often do you forget or accidentally miss a dose? Never  Do you use a pillbox? No  Recent blood pressure readings are as follows: 134/86  Patient has  follow-up with [facility] on 04/26/2023 @ 1030.  [name], CPA/CMA Clinical Pharmacist Assistant Phone: [phone]

## 2023-02-10 NOTE — Progress Notes (Unsigned)
Name: Patricia Galloway   MRN: [id number]    DOB: July 01, 1951   Date:02/10/2023       Progress Note  Subjective  Chief Complaint  Follow Up  HPI  Basal cell carcinoma right leg: she is doing well, s/p removal and chemotherapy. Her gait is back to normal , full rom of motion.  Under the care of Dr. Orlie Dakin . She states she has intermittent aching at the scar area, after last visit with  me she saw a new nodule, had a PET scan , biopsy and MRI femur that showed recurrence of cancer, seen by Dr. Domingo Cocking ortho oncologist and had another excisional surgery done 07/2021 and second one 12/22 , she still has some flaking and scarring over the wound, she saw plastic surgeon and is doing well with Vashe solution . She is still having weakness on right thigh, but able to do all ADL and instrumental activity of daily living. She completed home PT   Lung nodules: monitored by Dr. Orlie Dakin ,she had repeat CT done 03/22 stable for over one year. She states she has a follow up with him this year and may have a PET scan    Hyperlipidemia:she refuses statin therapy ,but willing to have labs done   HTN with white coat: bp is finally controlled without side effects, she is only on norvasc bp is at goal today, it was very high when she went to plastic surgeon   BPPV: resolved   Major Depression in remission/Anxiety   She is only taking valium prn . She still has episodes of anxiety but does not feel depressed, she is still worried about her husband that has metastatic cancer and wakes up in the middle of the night crying    Asthma/COPD:. She could not tolerate Trelegy but has been doing well on Breo , singulair and prn albuterol - not using it very often   She had a CT lund done 03/22. She states symptoms are controlled with medications.   Atherosclerosis aorta: refuses statin therapy also not taking aspirin. Reviewed PET scan from 12/15/2020. We will recheck labs today    Osteoporosis: discussed options, she was  seen by Dr Tedd Sias 03/2018 and was given Alendronate but she states never started medication, she states she has a high calcium diet and some otc supplementations. She is taking bone restore . She does not want any other medications    Chronic Pancreatitis: found on CT abdomen and also on PET scan done on 11/06/2019 and also on pet done 12/18/2020 , denies nausea, vomiting or indigestion. Weight is stable   Aneurysm of ascending aorta: found on CT 2019, but not on CT 2020 and 2021,and 2022  discussed CTA or MRA, but she is not interested on follow up . She continues to refuse statin therapy   Patient Active Problem List   Diagnosis Date Noted   Depression, major, in remission (HCC) 05/11/2022   COPD with asthma 05/11/2022   Osteoporosis without current pathological fracture 05/11/2022   Dyslipidemia 05/11/2022   History of basal cell carcinoma (BCC) excision 05/11/2022   Red blood cell antibody positive 08/03/2021   Mass of thigh 06/11/2021   Basal cell carcinoma 03/29/2017   Thoracic aortic aneurysm without rupture (HCC) 10/31/2016   Atherosclerosis of aorta (HCC) 10/31/2016   Chronic pancreatitis (HCC) 10/31/2016   Pulmonary nodule 10/31/2016   Centrilobular emphysema (HCC) 10/31/2016   Iron deficiency anemia due to chronic blood loss 10/24/2016   Basal cell carcinoma, leg, right 08/24/2016  Hyperglycemia 08/01/2016   Chronic thoracic back pain 08/04/2015   Panic attack 08/04/2015   White coat syndrome with hypertension 08/04/2015   Major depression, recurrent (HCC) 08/04/2015   Asthma, moderate persistent, poorly-controlled 08/04/2015    Past Surgical History:  Procedure Laterality Date   BASAL CELL CARCINOMA EXCISION Right 03/29/2017   Procedure: EXCISION OF RIGHT LEG BASEL CELL CARCINOMA;  Surgeon: Peggye Form, DO;  Location: [facility] OR;  Service: Plastics;  Laterality: Right;   COLON SURGERY  2002   colostomy for 10 months after a perfurated sigmoid    COLOSTOMY REVERSAL   2003   EMBOLIZATION Right 10/12/2016   Procedure: Embolization;  Surgeon: Renford Dills, MD;  Location: [facility] INV[facility]IVE CV LAB;  Service: Cardiovascular;  Laterality: Right;   etopic  1610,9604   INCISION AND DRAINAGE OF WOUND Right 03/30/2017   Procedure: IRRIGATION AND DEBRIDEMENT WOUND; VAC PLACEMENT;  Surgeon: Peggye Form, DO;  Location: [facility] ORS;  Service: Plastics;  Laterality: Right;   TONSILLECTOMY      Family History  Problem Relation Age of Onset   Dementia Mother    Aortic stenosis Mother    Osteoporosis Mother    Dementia Father    Diabetes Father    Hyperlipidemia Father    Hypertension Father    Aneurysm Sister    Hyperlipidemia Sister    Hypertension Sister    Diabetes Sister    Breast cancer Neg Hx     Social History   Tobacco Use   Smoking status: Former    Packs/day: 0.50    Years: 25.00    Additional pack years: 0.00    Total pack years: 12.50    Types: Cigarettes    Start date: 09/05/1972    Quit date: 08/03/1998    Years since quitting: 24.5   Smokeless tobacco: Never   Tobacco comments:    smoking cessation materials not required  Substance Use Topics   Alcohol use: No    Alcohol/week: 0.0 standard drinks of alcohol     Current Outpatient Medications:    albuterol (VENTOLIN HFA) 108 (90 Base) MCG/ACT inhaler, Inhale 2 puffs into the lungs every 6 (six) hours as needed for wheezing or shortness of breath. Patient requests generic Ventolin, Disp: 18 g, Rfl: 1   amLODipine (NORVASC) 5 MG tablet, TAKE ONE TABLET BY MOUTH ONCE DAILY, Disp: 90 tablet, Rfl: 1   benzonatate (TESSALON) 100 MG capsule, Take 1-2 capsules (100-200 mg total) by mouth 2 (two) times daily as needed., Disp: 40 capsule, Rfl: 0   BREO ELLIPTA 200-25 MCG/ACT AEPB, INHALE 1 PUFF BY MOUTH INTO LUNGS ONCE daily, Disp: 180 each, Rfl: 1   Cholecalciferol (VITAMIN D3) 250 MCG (10000 UT) TABS, Take 1 tablet by mouth 2 (two) times a week., Disp: , Rfl:    diazepam (VALIUM) 5 MG  tablet, Take 1 tablet (5 mg total) by mouth daily as needed for anxiety. 30 minutes before doctor's visit, Disp: 30 tablet, Rfl: 0   magnesium 30 MG tablet, Take 30 mg by mouth daily., Disp: , Rfl:    montelukast (SINGULAIR) 10 MG tablet, TAKE ONE TABLET BY MOUTH EVERYDAY AT BEDTIME, Disp: 90 tablet, Rfl: 1   Multiple Vitamin (MULTIVITAMIN) capsule, Take 2 capsules by mouth daily., Disp: , Rfl:    OVER THE COUNTER MEDICATION, Take 1 tablet by mouth 2 (two) times daily. Nutri-Calm, Disp: , Rfl:    OVER THE COUNTER MEDICATION, Life extension bone strength / life extension skin restore ceramide /  life extension super bio curcumin, Disp: , Rfl:    Wound Cleansers (VASHE WOUND) 0.033 % SOLN, Apply 1 each topically daily at 12 noon., Disp: , Rfl:   Allergies  Allergen Reactions   Penicillins Anaphylaxis    Has patient had a PCN reaction causing immediate rash, facial/tongue/throat swelling, SOB or lightheadedness with hypotension: Yes Has patient had a PCN reaction causing severe rash involving mucus membranes or skin necrosis: No Has patient had a PCN reaction that required hospitalization: No Has patient had a PCN reaction occurring within the last 10 years: No If all of the above answers are "NO", then may proceed with Cephalosporin use.    Codeine Nausea And Vomiting   Latex Itching   Sulfa Antibiotics Other (See Comments)    TOPICAL-SULFA CAUSED BLISTERS   Tape Other (See Comments)    Reaction:  Blisters and burning  Pt states that paper tape is okay.     Xopenex [Levalbuterol Hcl] Anxiety    I personally reviewed active problem list, medication list, allergies, family history, social history, health maintenance with the patient/caregiver today.   ROS  ***  Objective  There were no vitals filed for this visit.  There is no height or weight on file to calculate BMI.  Physical Exam ***  No results found for this or any previous visit (from the past 2160  hour(s)).   PHQ2/9:    01/09/2023   11:38 AM 11/04/2022   10:27 AM 05/11/2022   10:45 AM 11/04/2021   11:22 AM 10/28/2021   10:22 AM  Depression screen PHQ 2/9  Decreased Interest 0 0 0 0 0  Down, Depressed, Hopeless 0 0 0 0 0  PHQ - 2 Score 0 0 0 0 0  Altered sleeping 0  0 0 0  Tired, decreased energy 0  0 0 3  Change in appetite 0  0 0 0  Feeling bad or failure about yourself  0  0 0 0  Trouble concentrating 0  0 0 0  Moving slowly or fidgety/restless 0  0 0 0  Suicidal thoughts 0  0 0 0  PHQ-9 Score 0  0 0 3  Difficult doing work/chores     Not difficult at all    phq 9 is {gen pos [id number]}   Fall Risk:    01/09/2023   11:38 AM 11/04/2022   10:18 AM 05/11/2022   10:45 AM 11/04/2021   11:21 AM 10/28/2021   10:26 AM  Fall Risk   Falls in the past year? 0 0 0 0 0  Number falls in past yr: 0 0 0 0 0  Injury with Fall? 0 0 0 0 0  Risk for fall due to : No Fall Risks No Fall Risks No Fall Risks No Fall Risks No Fall Risks  Follow up Falls prevention discussed Education provided;Falls prevention discussed Falls prevention discussed Falls prevention discussed Falls prevention discussed      Functional Status Survey:      Assessment & Plan  *** There are no diagnoses linked to this encounter.

## 2023-02-13 ENCOUNTER — Encounter: Payer: Self-pay | Admitting: Family Medicine

## 2023-02-13 ENCOUNTER — Ambulatory Visit (INDEPENDENT_AMBULATORY_CARE_PROVIDER_SITE_OTHER): Payer: Medicare HMO | Admitting: Family Medicine

## 2023-02-13 VITALS — BP 142/78 | HR 89 | Temp 97.7°F | Resp 16 | Ht 63.0 in | Wt 112.1 lb

## 2023-02-13 DIAGNOSIS — I7121 Aneurysm of the ascending aorta, without rupture: Secondary | ICD-10-CM | POA: Diagnosis not present

## 2023-02-13 DIAGNOSIS — I10 Essential (primary) hypertension: Secondary | ICD-10-CM

## 2023-02-13 DIAGNOSIS — J4489 Other specified chronic obstructive pulmonary disease: Secondary | ICD-10-CM | POA: Diagnosis not present

## 2023-02-13 DIAGNOSIS — M810 Age-related osteoporosis without current pathological fracture: Secondary | ICD-10-CM | POA: Diagnosis not present

## 2023-02-13 DIAGNOSIS — K861 Other chronic pancreatitis: Secondary | ICD-10-CM

## 2023-02-13 DIAGNOSIS — Z85828 Personal history of other malignant neoplasm of skin: Secondary | ICD-10-CM | POA: Diagnosis not present

## 2023-02-13 DIAGNOSIS — Z9889 Other specified postprocedural states: Secondary | ICD-10-CM | POA: Diagnosis not present

## 2023-02-13 DIAGNOSIS — D692 Other nonthrombocytopenic purpura: Secondary | ICD-10-CM | POA: Diagnosis not present

## 2023-02-13 DIAGNOSIS — Z1231 Encounter for screening mammogram for malignant neoplasm of breast: Secondary | ICD-10-CM | POA: Diagnosis not present

## 2023-02-13 DIAGNOSIS — F325 Major depressive disorder, single episode, in full remission: Secondary | ICD-10-CM | POA: Diagnosis not present

## 2023-02-13 DIAGNOSIS — J432 Centrilobular emphysema: Secondary | ICD-10-CM

## 2023-02-13 MED ORDER — BENZONATATE 100 MG PO CAPS: 100.0000 mg | ORAL_CAPSULE | Freq: Three times a day (TID) | ORAL | 0 refills | Status: DC | PRN

## 2023-02-13 MED ORDER — DIAZEPAM 5 MG PO TABS: 5.0000 mg | ORAL_TABLET | Freq: Every day | ORAL | 0 refills | Status: DC | PRN

## 2023-02-22 ENCOUNTER — Ambulatory Visit: Payer: Medicare HMO | Admitting: Family Medicine

## 2023-03-28 ENCOUNTER — Other Ambulatory Visit: Payer: Self-pay | Admitting: Family Medicine

## 2023-03-28 DIAGNOSIS — J432 Centrilobular emphysema: Secondary | ICD-10-CM

## 2023-03-28 DIAGNOSIS — I10 Essential (primary) hypertension: Secondary | ICD-10-CM

## 2023-03-28 DIAGNOSIS — J4540 Moderate persistent asthma, uncomplicated: Secondary | ICD-10-CM

## 2023-03-31 ENCOUNTER — Other Ambulatory Visit: Payer: Self-pay | Admitting: Family Medicine

## 2023-03-31 DIAGNOSIS — I10 Essential (primary) hypertension: Secondary | ICD-10-CM

## 2023-05-31 NOTE — Progress Notes (Signed)
[facility] Quality Team Note  Name: Patricia Galloway Date of Birth: 1950/10/31 MRN: [id number] Date: 05/31/2023  [facility] Quality Team has reviewed this patient's chart, please see recommendations below:  [facility] Quality Other; (BCS, COL- BREAST CANCER AND COLON CANCER SCREENING- PATIENT NEEDS MAMMOGRAM AND COLON SCREENING BEFORE END OF YEAR FOR GAP CLOSURE. PLEASE ADDRESS AT NEXT OFFICE VISIT 08/14/2023)

## 2023-06-06 ENCOUNTER — Encounter: Payer: Self-pay | Admitting: Family Medicine

## 2023-06-06 ENCOUNTER — Other Ambulatory Visit: Payer: Self-pay

## 2023-06-06 DIAGNOSIS — J4540 Moderate persistent asthma, uncomplicated: Secondary | ICD-10-CM

## 2023-06-06 DIAGNOSIS — J432 Centrilobular emphysema: Secondary | ICD-10-CM

## 2023-06-06 MED ORDER — ALBUTEROL SULFATE HFA 108 (90 BASE) MCG/ACT IN AERS: 2.0000 | INHALATION_SPRAY | Freq: Four times a day (QID) | RESPIRATORY_TRACT | 1 refills | Status: DC | PRN

## 2023-07-11 ENCOUNTER — Other Ambulatory Visit: Payer: Self-pay | Admitting: Family Medicine

## 2023-07-11 DIAGNOSIS — I10 Essential (primary) hypertension: Secondary | ICD-10-CM

## 2023-07-11 DIAGNOSIS — J4540 Moderate persistent asthma, uncomplicated: Secondary | ICD-10-CM

## 2023-07-11 DIAGNOSIS — J432 Centrilobular emphysema: Secondary | ICD-10-CM

## 2023-07-31 NOTE — Progress Notes (Unsigned)
Name: Patricia Galloway   MRN: [id number]    DOB: [birth year]   Date:08/01/2023       Progress Note  Subjective  Chief Complaint  Follow Up  HPI  Discussed the use of AI scribe software for clinical note transcription with the patient, who gave verbal consent to proceed.  History of Present Illness   The patient, a widower with a history of multiple medical conditions, presents with ongoing fatigue and generalized weakness. The patient reports a significant loss of energy following multiple surgeries for basal cell carcinoma, which resulted in substantial muscle loss of right quad. The patient's energy levels have not recovered since these surgeries, which were followed by an intense period of caregiving for her late spouse.  The patient's spouse passed away recently after a prolonged illness, during which the patient was the primary caregiver. The patient describes this period as physically and emotionally exhausting, with little time for self-care, including regular meals. This period of stress and neglect of personal health may have contributed to the patient's weight loss and ongoing fatigue.  The patient also reports shortness of breath and wheezing, particularly when tired. The patient has a history of asthma, COPD, and emphysema, and uses Breo and Singulair regularly, with albuterol as a rescue inhaler. The patient notes that the use of Valium seems to alleviate the degree of exhaustion in the lungs, suggesting a possible link between the patient's respiratory symptoms and stress or anxiety.  The patient has a history of hypertension and high cholesterol, but is not currently interested in pursuing further cardiac testing or cholesterol medication. The patient also has a history of senile purpura, which has improved since discontinuing aspirin. The patient has a known aortic atherosclerosis and a CT scan that showed possible aneurysm of Aorta but cannot afford getting further testing at this  time.  The patient has a history of chronic pancreatitis, identified on a CT scan and PET scan, but reports no current symptoms such as nausea, vomiting, or abdominal pain. The patient also has a history of osteoporosis and has been managing this with a high-calcium diet and a supplement called Bone Restore.  The patient is currently living alone and managing her emotional health independently, despite a history of depression and complicated grief. The patient has not sought grief counseling following her spouse's death, but reports managing well when alone. The patient's friend is currently staying with her and providing some assistance around the house, which the patient finds helpful but also energetically challenging.         Patient Active Problem List   Diagnosis Date Noted   Senile purpura (HCC) 02/13/2023   Depression, major, in remission (HCC) 05/11/2022   COPD with asthma (HCC) 05/11/2022   Osteoporosis without current pathological fracture 05/11/2022   Dyslipidemia 05/11/2022   History of basal cell carcinoma (BCC) excision 05/11/2022   Red blood cell antibody positive 08/03/2021   Mass of thigh 06/11/2021   Basal cell carcinoma 03/29/2017   Thoracic aortic aneurysm without rupture (HCC) 10/31/2016   Atherosclerosis of aorta (HCC) 10/31/2016   Chronic pancreatitis (HCC) 10/31/2016   Pulmonary nodule 10/31/2016   Centrilobular emphysema (HCC) 10/31/2016   Iron deficiency anemia due to chronic blood loss 10/24/2016   Basal cell carcinoma, leg, right 08/24/2016   Hyperglycemia 08/01/2016   Chronic thoracic back pain 08/04/2015   Panic attack 08/04/2015   White coat syndrome with hypertension 08/04/2015   Major depression, recurrent (HCC) 08/04/2015   Asthma, moderate persistent, poorly-controlled 08/04/2015  Past Surgical History:  Procedure Laterality Date   BASAL CELL CARCINOMA EXCISION Right 03/29/2017   Procedure: EXCISION OF RIGHT LEG BASEL CELL CARCINOMA;  Surgeon:  Peggye Form, DO;  Location: [facility] OR;  Service: Plastics;  Laterality: Right;   COLON SURGERY  2002   colostomy for 10 months after a perfurated sigmoid    COLOSTOMY REVERSAL  2003   EMBOLIZATION (CATH LAB) Right 10/12/2016   Procedure: Embolization;  Surgeon: Renford Dills, MD;  Location: [facility] INVASIVE CV LAB;  Service: Cardiovascular;  Laterality: Right;   etopic  5956,3875   INCISION AND DRAINAGE OF WOUND Right 03/30/2017   Procedure: IRRIGATION AND DEBRIDEMENT WOUND; VAC PLACEMENT;  Surgeon: Peggye Form, DO;  Location: [facility] ORS;  Service: Plastics;  Laterality: Right;   TONSILLECTOMY      Family History  Problem Relation Age of Onset   Dementia Mother    Aortic stenosis Mother    Osteoporosis Mother    Dementia Father    Diabetes Father    Hyperlipidemia Father    Hypertension Father    Aneurysm Sister    Hyperlipidemia Sister    Hypertension Sister    Diabetes Sister    Breast cancer Neg Hx     Social History   Tobacco Use   Smoking status: Former    Current packs/day: 0.00    Average packs/day: 0.5 packs/day for 25.9 years (13.0 ttl pk-yrs)    Types: Cigarettes    Start date: 09/05/1972    Quit date: 08/03/1998    Years since quitting: 25.0   Smokeless tobacco: Never   Tobacco comments:    smoking cessation materials not required  Substance Use Topics   Alcohol use: No    Alcohol/week: 0.0 standard drinks of alcohol     Current Outpatient Medications:    albuterol (VENTOLIN HFA) 108 (90 Base) MCG/ACT inhaler, Inhale 2 puffs into the lungs every 6 (six) hours as needed for wheezing or shortness of breath., Disp: 18 g, Rfl: 1   amLODipine (NORVASC) 5 MG tablet, TAKE 1 TABLET BY MOUTH ONCE DAILY, Disp: 90 tablet, Rfl: 1   benzonatate (TESSALON) 100 MG capsule, Take 1-2 capsules (100-200 mg total) by mouth 3 (three) times daily as needed., Disp: 60 capsule, Rfl: 0   BREO ELLIPTA 200-25 MCG/ACT AEPB, INHALE 1 PUFF INTO THE LUNGS ONCE DAILY AS  DIRECTED., Disp: 180 each, Rfl: 1   Cholecalciferol (VITAMIN D3) 250 MCG (10000 UT) TABS, Take 1 tablet by mouth 2 (two) times a week., Disp: , Rfl:    diazepam (VALIUM) 5 MG tablet, Take 1 tablet (5 mg total) by mouth daily as needed for anxiety. 30 minutes before doctor's visit, Disp: 30 tablet, Rfl: 0   magnesium 30 MG tablet, Take 30 mg by mouth daily., Disp: , Rfl:    montelukast (SINGULAIR) 10 MG tablet, TAKE 1 TABLET BY MOUTH AT BEDTIME, Disp: 90 tablet, Rfl: 1   Multiple Vitamin (MULTIVITAMIN) capsule, Take 2 capsules by mouth daily., Disp: , Rfl:    OVER THE COUNTER MEDICATION, Take 1 tablet by mouth 2 (two) times daily. Nutri-Calm, Disp: , Rfl:    OVER THE COUNTER MEDICATION, Life extension bone strength / life extension skin restore ceramide / life extension super bio curcumin, Disp: , Rfl:    Wound Cleansers (VASHE WOUND) 0.033 % SOLN, Apply 1 each topically daily at 12 noon., Disp: , Rfl:   Allergies  Allergen Reactions   Penicillins Anaphylaxis    Has patient had  a PCN reaction causing immediate rash, facial/tongue/throat swelling, SOB or lightheadedness with hypotension: Yes Has patient had a PCN reaction causing severe rash involving mucus membranes or skin necrosis: No Has patient had a PCN reaction that required hospitalization: No Has patient had a PCN reaction occurring within the last 10 years: No If all of the above answers are "NO", then may proceed with Cephalosporin use.    Codeine Nausea And Vomiting   Latex Itching   Sulfa Antibiotics Other (See Comments)    TOPICAL-SULFA CAUSED BLISTERS   Tape Other (See Comments)    Reaction:  Blisters and burning  Pt states that paper tape is okay.     Xopenex [Levalbuterol Hcl] Anxiety    I personally reviewed active problem list, medication list, allergies, family history, social history, health maintenance with the patient/caregiver today.   ROS  Ten systems reviewed and is negative except as mentioned in HPI     Objective  Vitals:   08/01/23 1135  BP: 136/74  Pulse: (!) 101  Resp: 16  SpO2: 97%  Weight: 113 lb (51.3 kg)  Height: 5\' 3"  (1.6 m)    Body mass index is 20.02 kg/m.  Physical Exam  Constitutional: Patient appears well-developed and well-nourished.  No distress.  HEENT: head atraumatic, normocephalic, pupils equal and reactive to light, neck supple Cardiovascular: Normal rate, regular rhythm and normal heart sounds.  No murmur heard. No BLE edema. Pulmonary/Chest: Effort normal and breath sounds normal. No respiratory distress. Abdominal: Soft.  There is no tenderness. Psychiatric: Patient has a normal mood and affect. behavior is normal. Judgment and thought content normal.    PHQ2/9:    08/01/2023   11:34 AM 02/13/2023   10:01 AM 01/09/2023   11:38 AM 11/04/2022   10:27 AM 05/11/2022   10:45 AM  Depression screen PHQ 2/9  Decreased Interest 0 0 0 0 0  Down, Depressed, Hopeless 0 0 0 0 0  PHQ - 2 Score 0 0 0 0 0  Altered sleeping 0 0 0  0  Tired, decreased energy 0 1 0  0  Change in appetite 0 0 0  0  Feeling bad or failure about yourself  0 0 0  0  Trouble concentrating 0 0 0  0  Moving slowly or fidgety/restless 0 0 0  0  Suicidal thoughts 0 0 0  0  PHQ-9 Score 0 1 0  0  Difficult doing work/chores  Somewhat difficult       phq 9 is negative   Fall Risk:    08/01/2023   11:33 AM 02/13/2023   10:00 AM 01/09/2023   11:38 AM 11/04/2022   10:18 AM 05/11/2022   10:45 AM  Fall Risk   Falls in the past year? 0 0 0 0 0  Number falls in past yr: 0 0 0 0 0  Injury with Fall? 0 0 0 0 0  Risk for fall due to : No Fall Risks  No Fall Risks No Fall Risks No Fall Risks  Follow up Falls prevention discussed  Falls prevention discussed Education provided;Falls prevention discussed Falls prevention discussed      Functional Status Survey: Is the patient deaf or have difficulty hearing?: No Does the patient have difficulty seeing, even when wearing glasses/contacts?:  No Does the patient have difficulty concentrating, remembering, or making decisions?: No Does the patient have difficulty walking or climbing stairs?: No Does the patient have difficulty dressing or bathing?: No Does the patient have difficulty  doing errands alone such as visiting a doctor's office or shopping?: No    Assessment & Plan     Asthma/COPD/Emphysema Increased fatigue and wheezing, particularly when tired. Noted improvement with Valium, suggesting possible anxiety component. -Continue Breo daily, Singulair at night, and Albuterol as needed. -Consider referral to pulmonologist if symptoms persist or worsen.  Hypertension Well-controlled with Amlodipine nightly. -Continue Amlodipine nightly.  Anxiety/Grief Recent loss of spouse, history of complicated grief. Currently using Valium for anxiety and grief symptoms. -Refill Valium prescription. -Consider grief counseling through hospice.  Hyperlipidemia/Atherosclerosis Aorta Patient declines medication management. -Encourage continued healthy diet and lifestyle modifications.  Osteoporosis Patient declines medication, supplements with high calcium diet and Bone Restore from [employer]. -Continue current management.  Abdominal Aortic Aneurysm Previously identified on CT, not found on subsequent imaging. Patient declines further investigation at this time. -No action needed at this time.  Chronic Pancreatitis Asymptomatic, identified on previous imaging. Patient uses food enzymes as needed. -Continue current management.  Basal Cell Carcinoma No new lesions reported. -Continue current management.  Senile Purpura Improved since discontinuing aspirin. -No action needed at this time.  General Health Maintenance -Order CBC, liver function, cholesterol, sodium, and other routine labs. -Consider cortisol level if fatigue persists. -Encourage patient to maintain high protein diet to prevent further weight  loss. -Advise patient to call 911 if experiencing symptoms suggestive of cardiac event. -Follow-up in 6 months.

## 2023-08-01 ENCOUNTER — Encounter: Payer: Self-pay | Admitting: Family Medicine

## 2023-08-01 ENCOUNTER — Ambulatory Visit (INDEPENDENT_AMBULATORY_CARE_PROVIDER_SITE_OTHER): Payer: Medicare HMO | Admitting: Family Medicine

## 2023-08-01 VITALS — BP 136/74 | HR 101 | Resp 16 | Ht 63.0 in | Wt 113.0 lb

## 2023-08-01 DIAGNOSIS — J432 Centrilobular emphysema: Secondary | ICD-10-CM | POA: Diagnosis not present

## 2023-08-01 DIAGNOSIS — K861 Other chronic pancreatitis: Secondary | ICD-10-CM | POA: Diagnosis not present

## 2023-08-01 DIAGNOSIS — I10 Essential (primary) hypertension: Secondary | ICD-10-CM

## 2023-08-01 DIAGNOSIS — M810 Age-related osteoporosis without current pathological fracture: Secondary | ICD-10-CM

## 2023-08-01 DIAGNOSIS — I700 Atherosclerosis of aorta: Secondary | ICD-10-CM

## 2023-08-01 DIAGNOSIS — I7121 Aneurysm of the ascending aorta, without rupture: Secondary | ICD-10-CM

## 2023-08-01 DIAGNOSIS — J4489 Other specified chronic obstructive pulmonary disease: Secondary | ICD-10-CM | POA: Diagnosis not present

## 2023-08-01 DIAGNOSIS — Z85828 Personal history of other malignant neoplasm of skin: Secondary | ICD-10-CM | POA: Diagnosis not present

## 2023-08-01 DIAGNOSIS — D692 Other nonthrombocytopenic purpura: Secondary | ICD-10-CM | POA: Diagnosis not present

## 2023-08-01 DIAGNOSIS — F325 Major depressive disorder, single episode, in full remission: Secondary | ICD-10-CM

## 2023-08-01 DIAGNOSIS — Z9889 Other specified postprocedural states: Secondary | ICD-10-CM

## 2023-08-01 MED ORDER — DIAZEPAM 5 MG PO TABS: 5.0000 mg | ORAL_TABLET | Freq: Every day | ORAL | 0 refills | Status: DC | PRN

## 2023-08-02 LAB — LIPID PANEL
Cholesterol: 262 mg/dL — ABNORMAL HIGH (ref <200)
HDL: 98 mg/dL (ref >=50)
LDL Cholesterol (Calc): 146 mg/dL — ABNORMAL HIGH
Non-HDL Cholesterol (Calc): 164 mg/dL — ABNORMAL HIGH (ref <130)
Total CHOL/HDL Ratio: 2.7 (calc) (ref <5.0)
Triglycerides: 80 mg/dL (ref <150)

## 2023-08-02 LAB — CBC WITH DIFFERENTIAL/PLATELET
Absolute Lymphocytes: 2243 {cells}/uL (ref 850–3900)
Absolute Monocytes: 798 {cells}/uL (ref 200–950)
Basophils Absolute: 84 {cells}/uL (ref 0–200)
Basophils Relative: 1 %
Eosinophils Absolute: 143 {cells}/uL (ref 15–500)
Eosinophils Relative: 1.7 %
HCT: 41.8 % (ref 35.0–45.0)
Hemoglobin: 14 g/dL (ref 11.7–15.5)
MCH: 29.6 pg (ref 27.0–33.0)
MCHC: 33.5 g/dL (ref 32.0–36.0)
MCV: 88.4 fL (ref 80.0–100.0)
MPV: 11.2 fL (ref 7.5–12.5)
Monocytes Relative: 9.5 %
Neutro Abs: 5132 {cells}/uL (ref 1500–7800)
Neutrophils Relative %: 61.1 %
Platelets: 329 10*3/uL (ref 140–400)
RBC: 4.73 10*6/uL (ref 3.80–5.10)
RDW: 13.1 % (ref 11.0–15.0)
Total Lymphocyte: 26.7 %
WBC: 8.4 10*3/uL (ref 3.8–10.8)

## 2023-08-02 LAB — COMPLETE METABOLIC PANEL WITH GFR
AG Ratio: 1.9 (calc) (ref 1.0–2.5)
ALT: 14 U/L (ref 6–29)
AST: 16 U/L (ref 10–35)
Albumin: 4.3 g/dL (ref 3.6–5.1)
Alkaline phosphatase (APISO): 66 U/L (ref 37–153)
BUN: 23 mg/dL (ref 7–25)
CO2: 30 mmol/L (ref 20–32)
Calcium: 9.3 mg/dL (ref 8.6–10.4)
Chloride: 99 mmol/L (ref 98–110)
Creat: 0.63 mg/dL (ref 0.60–1.00)
Globulin: 2.3 g/dL (ref 1.9–3.7)
Glucose, Bld: 107 mg/dL — ABNORMAL HIGH (ref 65–99)
Potassium: 3.6 mmol/L (ref 3.5–5.3)
Sodium: 137 mmol/L (ref 135–146)
Total Bilirubin: 0.5 mg/dL (ref 0.2–1.2)
Total Protein: 6.6 g/dL (ref 6.1–8.1)
eGFR: 94 mL/min/{1.73_m2} (ref >=60)

## 2023-08-14 ENCOUNTER — Ambulatory Visit: Payer: Medicare HMO | Admitting: Family Medicine

## 2023-09-08 ENCOUNTER — Other Ambulatory Visit: Payer: Self-pay | Admitting: Nurse Practitioner

## 2023-09-08 DIAGNOSIS — J432 Centrilobular emphysema: Secondary | ICD-10-CM

## 2023-09-08 DIAGNOSIS — J4540 Moderate persistent asthma, uncomplicated: Secondary | ICD-10-CM

## 2023-09-12 NOTE — Telephone Encounter (Signed)
 Requested Prescriptions  Pending Prescriptions Disp Refills   albuterol  (VENTOLIN  HFA) 108 (90 Base) MCG/ACT inhaler [Pharmacy Med Name: ALBUTEROL  SULFATE HFA 108 (90 BASE)] 18 g 1    Sig: INHALE 2 PUFFS INTO LUNGS EVERY 6 HOURS AS NEEDED FOR WHEEZING OR SHORTNESS OF BREATH     Pulmonology:  Beta Agonists 2 Passed - 09/12/2023  9:47 AM      Passed - Last BP in normal range    BP Readings from Last 1 Encounters:  08/01/23 136/74         Passed - Last Heart Rate in normal range    Pulse Readings from Last 1 Encounters:  08/01/23 (!) 101         Passed - Valid encounter within last 12 months    Recent Outpatient Visits           1 month ago Aneurysm of ascending aorta without rupture [facility])    [facility] Glenard Mire, MD   7 months ago Centrilobular emphysema [facility])    [facility] Glenard Mire, MD   8 months ago Breast cancer screening by mammogram   [facility] Sowles, Krichna, MD   1 year ago Centrilobular emphysema [facility])   [facility] Sowles, Krichna, MD   1 year ago Centrilobular emphysema [facility])   [facility] Glenard Mire, MD       Future Appointments             In 4 months Glenard, Krichna, MD [facility]

## 2023-10-04 ENCOUNTER — Other Ambulatory Visit: Payer: Self-pay | Admitting: Family Medicine

## 2023-10-04 DIAGNOSIS — J4540 Moderate persistent asthma, uncomplicated: Secondary | ICD-10-CM

## 2023-10-04 DIAGNOSIS — J432 Centrilobular emphysema: Secondary | ICD-10-CM

## 2023-10-06 ENCOUNTER — Other Ambulatory Visit: Payer: Self-pay | Admitting: Family Medicine

## 2023-10-06 DIAGNOSIS — J432 Centrilobular emphysema: Secondary | ICD-10-CM

## 2023-10-06 DIAGNOSIS — I10 Essential (primary) hypertension: Secondary | ICD-10-CM

## 2023-10-06 DIAGNOSIS — J4540 Moderate persistent asthma, uncomplicated: Secondary | ICD-10-CM

## 2023-10-10 ENCOUNTER — Encounter: Payer: Self-pay | Admitting: Family Medicine

## 2023-10-10 ENCOUNTER — Other Ambulatory Visit: Payer: Self-pay | Admitting: Family Medicine

## 2023-10-10 DIAGNOSIS — J4489 Other specified chronic obstructive pulmonary disease: Secondary | ICD-10-CM

## 2023-10-11 ENCOUNTER — Other Ambulatory Visit: Payer: Self-pay | Admitting: Pharmacist

## 2023-10-11 ENCOUNTER — Other Ambulatory Visit: Payer: Self-pay | Admitting: Family Medicine

## 2023-10-11 ENCOUNTER — Encounter: Payer: Self-pay | Admitting: Pharmacist

## 2023-10-11 DIAGNOSIS — J4489 Other specified chronic obstructive pulmonary disease: Secondary | ICD-10-CM

## 2023-10-11 MED ORDER — FLUTICASONE-SALMETEROL 100-50 MCG/ACT IN AEPB: 1.0000 | INHALATION_SPRAY | Freq: Two times a day (BID) | RESPIRATORY_TRACT | 0 refills | Status: DC

## 2023-10-11 NOTE — Progress Notes (Signed)
 10/11/2023 Name: Patricia Galloway MRN: [id number] DOB: Feb 16, 1951  Chief Complaint  Patient presents with   Medication Assistance   Medication Management    Patricia Galloway is a 73 y.o. year old female who presented for a telephone visit.   They were referred to the pharmacist by their PCP for assistance in managing medication access.    Subjective:  Care Team: Primary Care Provider: Sowles, Krichna, MD ; Next Scheduled Visit: 01/31/2024   Medication Access/Adherence  Current Pharmacy:  [facility] - [place], [place] - [address] [place] [address] Phone: [phone] Fax: [phone]   Patient reports affordability concerns with their medications: Yes  Patient reports access/transportation concerns to their pharmacy: No  Patient reports adherence concerns with their medications:  No    Reports cost of her Breo inhaler is no longer affordable as since new calendar year started cost is >$100 per month supply Reports currently has 1 month supply of Breo remaining  Denies using weekly pillbox   COPD:  Current medications:  - Breo - 1 puff daily - montelukast  10 mg nightly at bedtime - albuterol  HFA inhaler - 2 puffs every 6 hours as needed for wheezing or shortness of breath  Medications tried in the past: Trelegy (took my breath away), Incruse  Denies needing her albuterol  inhaler recently, but carries this with her at all time   Hypertension:  Current medications: amlodipine  5 mg daily  Patient has an upper arm home BP cuff; denies checking recently  Patient denies hypotensive s/sx including dizziness, lightheadedness.   Objective:   Lab Results  Component Value Date   CREATININE 0.63 08/01/2023   BUN 23 08/01/2023   NA 137 08/01/2023   K 3.6 08/01/2023   CL 99 08/01/2023   CO2 30 08/01/2023    Lab Results  Component Value Date   CHOL 262 (H) 08/01/2023   HDL 98 08/01/2023   LDLCALC 146 (H) 08/01/2023   TRIG 80  08/01/2023   CHOLHDL 2.7 08/01/2023   BP Readings from Last 3 Encounters:  08/01/23 136/74  02/13/23 (!) 142/78  01/09/23 134/86   Pulse Readings from Last 3 Encounters:  08/01/23 (!) 101  02/13/23 89  01/09/23 (!) 118     Medications Reviewed Today     Reviewed by Alana Sharyle LABOR, RPH-CPP (Pharmacist) on 10/11/23 at 1143  Med List Status: <None>   Medication Order Taking? Sig Documenting Provider Last Dose Status Informant  albuterol  (VENTOLIN  HFA) 108 (90 Base) MCG/ACT inhaler [id number] Yes INHALE 2 PUFFS INTO LUNGS EVERY 6 HOURS AS NEEDED FOR WHEEZING OR SHORTNESS OF SHERIDA Loron, Krichna, MD Taking Active   amLODipine  (NORVASC ) 5 MG tablet [id number] Yes TAKE 1 TABLET BY MOUTH ONCE DAILY Sowles, Krichna, MD Taking Active   benzonatate  (TESSALON ) 100 MG capsule [id number] No Take 1-2 capsules (100-200 mg total) by mouth 3 (three) times daily as needed.  Patient not taking: Reported on 10/11/2023   Sowles, Krichna, MD Not Taking Active   BREO ELLIPTA  200-25 MCG/ACT AEPB [id number] Yes INHALE 1 PUFF INTO THE LUNGS ONCE DAILY AS DIRECTED. Sowles, Krichna, MD Taking Active   Cholecalciferol (VITAMIN D3) 250 MCG (10000 UT) TABS [id number]  Take 1 tablet by mouth 2 (two) times a week. [provider]  Active   diazepam  (VALIUM ) 5 MG tablet [id number] Yes Take 1 tablet (5 mg total) by mouth daily as needed for anxiety. 30 minutes before doctor's visit Sowles, Krichna, MD Taking Active  magnesium 30 MG tablet [id number] Yes Take 30 mg by mouth daily. [provider] Taking Active   montelukast  (SINGULAIR ) 10 MG tablet [id number] Yes TAKE 1 TABLET BY MOUTH AT BEDTIME Sowles, Krichna, MD Taking Active   Multiple Vitamin (MULTIVITAMIN) capsule [id number] Yes Take 2 capsules by mouth daily. [provider] Taking Active            Med Note NORVEL KIRSCH   Tue Apr 06, 2021 10:49 AM)    OVER THE COUNTER MEDICATION [id number]  Take 1 tablet by mouth 2 (two) times daily.  Nutri-Calm [provider]  Active Self           Med Note VASHTI MARGARIE JONETTA [place] Oct 28, 2021 10:18 AM)    OVER THE COUNTER MEDICATION [id number]  Life extension bone strength / life extension skin restore ceramide / life extension super bio curcumin [provider]  Active   Wound Cleansers (VASHE WOUND) 0.033 % SOLN [id number]  Apply 1 each topically daily at 12 noon. Dillingham, Estefana RAMAN, DO  Active               Assessment/Plan:   Comprehensive medication review performed; medication list updated in electronic medical record  Encourage patient to consider using weekly pillbox  COPD/Asthma: - Reviewed appropriate inhaler technique including importance of rinsing and spitting out after each use - From review of information from [facility] website for patient's health plan, note that Ranell is a tier 3 option through her coverage and in 2025 patient's tier 3 copayment is 25% coinsurance. Note fluticasone /salmeterol (generic Advair) diskus is a preferred tier 2 alternative through the plan Patient request to switch from Breo to fluticasone /salmeterol (generic Advair) diskus for cost savings Will collaborate with PCP to request provider send prescription for generic Advair to pharmacy for her - Discuss other opportunities for cost savings including use of preferred pharmacies ([facility] or [facility]). Patient prefers to continue to use [facility] - Note patient does not currently meet criteria for [facility] patient assistance as she has not spent $600 out of pocket on medications for the calendar year  Hypertension: - Reviewed long term cardiovascular and renal outcomes of uncontrolled blood pressure - Recommended to check home blood pressure and heart rate, keep log of results and have this record to review at upcoming medical appointments. Patient to contact provider office sooner if needed for readings outside of established parameters or  symptoms    Follow Up Plan: Clinical Pharmacist will follow up with patient by telephone on 12/06/2023 at 1:00 PM   Sharyle Sia, PharmD, [facility] [phone]

## 2023-10-11 NOTE — Patient Instructions (Signed)
 Goals Addressed             This Visit's Progress    Pharmacy Goals       The following is the web address for the "how to use" video for the generic Advair inhaler.   [email or web address]   Please copy and paste the link into your web browser to review. Please let our office or your pharmacy know if you have any further questions about this medication.    Thank you!   Arthur Lash, PharmD, [facility] [phone]

## 2023-10-24 ENCOUNTER — Encounter: Payer: Self-pay | Admitting: Family Medicine

## 2023-10-25 ENCOUNTER — Telehealth: Payer: Medicare HMO | Admitting: Family Medicine

## 2023-10-25 ENCOUNTER — Encounter: Payer: Self-pay | Admitting: Family Medicine

## 2023-10-25 DIAGNOSIS — J069 Acute upper respiratory infection, unspecified: Secondary | ICD-10-CM

## 2023-10-25 DIAGNOSIS — F419 Anxiety disorder, unspecified: Secondary | ICD-10-CM

## 2023-10-25 DIAGNOSIS — J441 Chronic obstructive pulmonary disease with (acute) exacerbation: Secondary | ICD-10-CM

## 2023-10-25 MED ORDER — BENZONATATE 100 MG PO CAPS: 100.0000 mg | ORAL_CAPSULE | Freq: Three times a day (TID) | ORAL | 0 refills | Status: DC | PRN

## 2023-10-25 NOTE — Progress Notes (Signed)
Name: Patricia Galloway   MRN: [id number]    DOB: 1950-12-11   Date:10/25/2023       Progress Note  Subjective  Chief Complaint  Chief Complaint  Patient presents with   Nasal Congestion    Sx onset for a week   Fatigue   Generalized Body Aches   Chills    I connected with  Patricia Galloway  on 10/25/23 at 11:00 AM EST by a video enabled telemedicine application and verified that I am speaking with the correct person using two identifiers.  I discussed the limitations of evaluation and management by telemedicine and the availability of in person appointments. The patient expressed understanding and agreed to proceed with a virtual visit  Staff also discussed with the patient that there may be a patient responsible charge related to this service. Patient Location: at home  Provider Location: [facility] Additional Individuals present: alone   Discussed the use of AI scribe software for clinical note transcription with the patient, who gave verbal consent to proceed.  History of Present Illness   Patricia Galloway is a 73 year old female with COPD and asthma who presents with respiratory symptoms following exposure to a sick individual.  She has been experiencing severe respiratory symptoms for the past week after exposure to a sick friend who visited her home on February 8th. The friend was later hospitalized and intubated, raising concerns about potential viral exposure. Her symptoms began around February 11th with difficulty breathing, which she managed with deep breathing exercises. By February 14th, she was able to clear her sinuses and breathe more fully. She experiences chills and hot flashes, indicating temperature dysregulation, but has not consistently monitored her temperature. She experienced a lack of appetite and energy, though she is now eating more, including soup and a sandwich, which she had not done in two weeks.  She experienced a large amount of mucus production, initially thick and  pale yellow, which later became darker and then lighter. Currently, the mucus is darker again  She has COPD with asthma and has been using Breo and Singulair regularly. She has not started Advair ( cheaper with new insurance plan) . She used albuterol more frequently than usual last week due to significant wheezing, which has since subsided. She also experienced a 'crinkling sound' in her lungs, which clears up with albuterol use. She keeps her albuterol inhaler on hand for immediate use. She has been taking Tessalon Perles for her cough and has about six or seven left. She has also been using diazepam occasionally to help with anxiety and relaxation, especially at night, which aids her in performing deep breathing exercises.  She feels very dehydrated despite drinking water. She has been using Liquid IV and Emergen-C to help with hydration and vitamin C intake.  She has experienced anxiety due to her illness and being alone. She has used diazepam to help relax and manage anxiety, which has been effective for her.        Patient Active Problem List   Diagnosis Date Noted   Senile purpura (HCC) 02/13/2023   Depression, major, in remission (HCC) 05/11/2022   COPD with asthma (HCC) 05/11/2022   Osteoporosis without current pathological fracture 05/11/2022   Dyslipidemia 05/11/2022   History of basal cell carcinoma (BCC) excision 05/11/2022   Red blood cell antibody positive 08/03/2021   Mass of thigh 06/11/2021   Basal cell carcinoma 03/29/2017   Thoracic aortic aneurysm without rupture (HCC) 10/31/2016   Atherosclerosis of aorta (HCC)  10/31/2016   Chronic pancreatitis (HCC) 10/31/2016   Pulmonary nodule 10/31/2016   Centrilobular emphysema (HCC) 10/31/2016   Iron deficiency anemia due to chronic blood loss 10/24/2016   Basal cell carcinoma, leg, right 08/24/2016   Hyperglycemia 08/01/2016   Chronic thoracic back pain 08/04/2015   Panic attack 08/04/2015   White coat syndrome with  hypertension 08/04/2015   Major depression, recurrent (HCC) 08/04/2015   Asthma, moderate persistent, poorly-controlled 08/04/2015    Social History   Tobacco Use   Smoking status: Former    Current packs/day: 0.00    Average packs/day: 0.5 packs/day for 25.9 years (13.0 ttl pk-yrs)    Types: Cigarettes    Start date: 09/05/1972    Quit date: 08/03/1998    Years since quitting: 25.2   Smokeless tobacco: Never   Tobacco comments:    smoking cessation materials not required  Substance Use Topics   Alcohol use: No    Alcohol/week: 0.0 standard drinks of alcohol     Current Outpatient Medications:    albuterol (VENTOLIN HFA) 108 (90 Base) MCG/ACT inhaler, INHALE 2 PUFFS INTO LUNGS EVERY 6 HOURS AS NEEDED FOR WHEEZING OR SHORTNESS OF BREATH, Disp: 18 g, Rfl: 1   amLODipine (NORVASC) 5 MG tablet, TAKE 1 TABLET BY MOUTH ONCE DAILY, Disp: 90 tablet, Rfl: 1   Cholecalciferol (VITAMIN D3) 250 MCG (10000 UT) TABS, Take 1 tablet by mouth 2 (two) times a week., Disp: , Rfl:    diazepam (VALIUM) 5 MG tablet, Take 1 tablet (5 mg total) by mouth daily as needed for anxiety. 30 minutes before doctor's visit, Disp: 30 tablet, Rfl: 0   fluticasone-salmeterol (ADVAIR) 100-50 MCG/ACT AEPB, Inhale 1 puff into the lungs 2 (two) times daily., Disp: 1 each, Rfl: 0   magnesium 30 MG tablet, Take 30 mg by mouth daily., Disp: , Rfl:    montelukast (SINGULAIR) 10 MG tablet, TAKE 1 TABLET BY MOUTH AT BEDTIME, Disp: 90 tablet, Rfl: 1   Multiple Vitamin (MULTIVITAMIN) capsule, Take 2 capsules by mouth daily., Disp: , Rfl:    OVER THE COUNTER MEDICATION, Take 1 tablet by mouth 2 (two) times daily. Nutri-Calm, Disp: , Rfl:    OVER THE COUNTER MEDICATION, Life extension bone strength / life extension skin restore ceramide / life extension super bio curcumin, Disp: , Rfl:    Wound Cleansers (VASHE WOUND) 0.033 % SOLN, Apply 1 each topically daily at 12 noon., Disp: , Rfl:   Allergies  Allergen Reactions    Penicillins Anaphylaxis    Has patient had a PCN reaction causing immediate rash, facial/tongue/throat swelling, SOB or lightheadedness with hypotension: Yes Has patient had a PCN reaction causing severe rash involving mucus membranes or skin necrosis: No Has patient had a PCN reaction that required hospitalization: No Has patient had a PCN reaction occurring within the last 10 years: No If all of the above answers are "NO", then may proceed with Cephalosporin use.    Codeine Nausea And Vomiting   Latex Itching   Sulfa Antibiotics Other (See Comments)    TOPICAL-SULFA CAUSED BLISTERS   Tape Other (See Comments)    Reaction:  Blisters and burning  Pt states that paper tape is okay.     Xopenex [Levalbuterol Hcl] Anxiety    I personally reviewed active problem list, medication list, allergies with the patient/caregiver today.  ROS  Ten systems reviewed and is negative except as mentioned in HPI    Objective  Virtual encounter, vitals not obtained.  There is  no height or weight on file to calculate BMI.  Nursing Note and Vital Signs reviewed.  Physical Exam  Awake, alert and oriented Mild tachypnea but speaking in full symptoms  Assessment and Plan    Acute Respiratory Illness in the setting of COPD with Asthma Likely viral illness with subsequent asthma exacerbation. Symptoms include cough with phlegm production, shortness of breath, and wheezing. Improvement noted with use of albuterol and Tessalon Perles. -Continue Breo and Singulair as prescribed. -Use albuterol as needed for wheezing. -Continue Tessalon Perles for cough (additional prescription provided). -Consider Mucinex to help with mucus production. -Continue supportive care with hydration and rest. -Contact provider if symptoms worsen for consideration of antibiotic and/or prednisone therapy, she states feeling much better over the past 24 hours and prefers waiting to initiate any other medications at this  time  Anxiety Occasional use of diazepam for anxiety, particularly during this acute illness. -Continue diazepam as needed for anxiety. -Contact provider when only two doses remain for refill.        -Red flags and when to present for emergency care or RTC including fever >101.64F, chest pain, shortness of breath, new/worsening/un-resolving symptoms,  reviewed with patient at time of visit. Follow up and care instructions discussed and provided in AVS. - I discussed the assessment and treatment plan with the patient. The patient was provided an opportunity to ask questions and all were answered. The patient agreed with the plan and demonstrated an understanding of the instructions.  I provided 15  minutes of non-face-to-face time during this encounter.  Ruel Favors, MD

## 2023-11-09 ENCOUNTER — Ambulatory Visit (INDEPENDENT_AMBULATORY_CARE_PROVIDER_SITE_OTHER): Payer: Medicare HMO

## 2023-11-09 DIAGNOSIS — Z0000 Encounter for general adult medical examination without abnormal findings: Secondary | ICD-10-CM | POA: Diagnosis not present

## 2023-11-09 NOTE — Progress Notes (Signed)
 Subjective:   Patricia Galloway is a 73 y.o. who presents for a Medicare Wellness preventive visit.  Visit Complete: Virtual I connected with  Patricia Galloway on 11/09/23 by a video and audio enabled telemedicine application and verified that I am speaking with the correct person using two identifiers.  Patient Location: Home  Provider Location: Office/Clinic  I discussed the limitations of evaluation and management by telemedicine. The patient expressed understanding and agreed to proceed.  Vital Signs: Because this visit was a virtual/telehealth visit, some criteria may be missing or patient reported. Any vitals not documented were not able to be obtained and vitals that have been documented are patient reported.   AWV Questionnaire: No: Patient Medicare AWV questionnaire was not completed prior to this visit.  Cardiac Risk Factors include: advanced age (>48men, >23 women);dyslipidemia;hypertension;sedentary lifestyle     Objective:    There were no vitals filed for this visit. There is no height or weight on file to calculate BMI.     11/09/2023    3:12 PM 11/04/2022   10:33 AM 10/28/2021   10:25 AM 10/26/2021   10:31 AM 09/02/2021    2:04 PM 12/22/2020    1:23 PM 11/10/2020    1:53 PM  Advanced Directives  Does Patient Have a Medical Advance Directive? Yes Yes Yes Yes Yes Yes Yes  Type of Estate agent of [place];Living will  Healthcare Power of [place];Living will Healthcare Power of [place];Living will Healthcare Power of [place];Living will Healthcare Power of [place];Living will   Does patient want to make changes to medical advance directive? No - Patient declined    Yes (ED - Information included in AVS)    Copy of Healthcare Power of Attorney in Chart? Yes - validated most recent copy scanned in chart (See row information)  Yes - validated most recent copy scanned in chart (See row information)        Current Medications (verified) Outpatient  Encounter Medications as of 11/09/2023  Medication Sig   albuterol (VENTOLIN HFA) 108 (90 Base) MCG/ACT inhaler INHALE 2 PUFFS INTO LUNGS EVERY 6 HOURS AS NEEDED FOR WHEEZING OR SHORTNESS OF BREATH   amLODipine (NORVASC) 5 MG tablet TAKE 1 TABLET BY MOUTH ONCE DAILY   benzonatate (TESSALON) 100 MG capsule Take 1-2 capsules (100-200 mg total) by mouth 3 (three) times daily as needed for cough.   Cholecalciferol (VITAMIN D3) 250 MCG (10000 UT) TABS Take 1 tablet by mouth 2 (two) times a week.   diazepam (VALIUM) 5 MG tablet Take 1 tablet (5 mg total) by mouth daily as needed for anxiety. 30 minutes before doctor's visit   magnesium 30 MG tablet Take 30 mg by mouth daily.   montelukast (SINGULAIR) 10 MG tablet TAKE 1 TABLET BY MOUTH AT BEDTIME   Multiple Vitamin (MULTIVITAMIN) capsule Take 2 capsules by mouth daily.   OVER THE COUNTER MEDICATION Take 1 tablet by mouth 2 (two) times daily. Nutri-Calm   OVER THE COUNTER MEDICATION Life extension bone strength / life extension skin restore ceramide / life extension super bio curcumin   Wound Cleansers (VASHE WOUND) 0.033 % SOLN Apply 1 each topically daily at 12 noon.   fluticasone-salmeterol (ADVAIR) 100-50 MCG/ACT AEPB Inhale 1 puff into the lungs 2 (two) times daily. (Patient not taking: Reported on 11/09/2023)   No facility-administered encounter medications on file as of 11/09/2023.    Allergies (verified) Penicillins, Codeine, Latex, Sulfa antibiotics, Tape, and Xopenex [levalbuterol hcl]   History: Past Medical History:  Diagnosis Date   Anemia    has had iron infusion   Anxiety    complicated grief   Aortic aneurysm (HCC)    found on CT in December, 2017   Asthma    as child , but no episodes in over 30 years   Basal cell carcinoma    Cancer (HCC) 08/2016   basal cell carcinoma - right leg   History of transfusion of packed red blood cells    Personal history of chemotherapy 2018   basal cell    Past Surgical History:  Procedure  Laterality Date   BASAL CELL CARCINOMA EXCISION Right 03/29/2017   Procedure: EXCISION OF RIGHT LEG BASEL CELL CARCINOMA;  Surgeon: Peggye Form, DO;  Location: [facility] OR;  Service: Plastics;  Laterality: Right;   COLON SURGERY  2002   colostomy for 10 months after a perfurated sigmoid    COLOSTOMY REVERSAL  2003   EMBOLIZATION (CATH LAB) Right 10/12/2016   Procedure: Embolization;  Surgeon: Renford Dills, MD;  Location: [facility] INVASIVE CV LAB;  Service: Cardiovascular;  Laterality: Right;   etopic  0981,1914   INCISION AND DRAINAGE OF WOUND Right 03/30/2017   Procedure: IRRIGATION AND DEBRIDEMENT WOUND; VAC PLACEMENT;  Surgeon: Peggye Form, DO;  Location: [facility] ORS;  Service: Plastics;  Laterality: Right;   TONSILLECTOMY     Family History  Problem Relation Age of Onset   Dementia Mother    Aortic stenosis Mother    Osteoporosis Mother    Dementia Father    Diabetes Father    Hyperlipidemia Father    Hypertension Father    Aneurysm Sister    Hyperlipidemia Sister    Hypertension Sister    Diabetes Sister    Breast cancer Neg Hx    Social History   Socioeconomic History   Marital status: Married    Spouse name: John   Number of children: 0   Years of education: Not on file   Highest education level: Bachelor's degree (e.g., BA, AB, BS)  Occupational History   Occupation: Retired  Tobacco Use   Smoking status: Former    Current packs/day: 0.00    Average packs/day: 0.5 packs/day for 25.9 years (13.0 ttl pk-yrs)    Types: Cigarettes    Start date: 09/05/1972    Quit date: 08/03/1998    Years since quitting: 25.2   Smokeless tobacco: Never   Tobacco comments:    smoking cessation materials not required  Vaping Use   Vaping status: Never Used  Substance and Sexual Activity   Alcohol use: No    Alcohol/week: 0.0 standard drinks of alcohol   Drug use: No   Sexual activity: Not Currently    Birth control/protection: Post-menopausal  Other Topics Concern   Not  on file  Social History Narrative   Not on file   Social Drivers of Health   Financial Resource Strain: Low Risk  (11/09/2023)   Overall Financial Resource Strain (CARDIA)    Difficulty of Paying Living Expenses: Not hard at all  Food Insecurity: No Food Insecurity (11/09/2023)   Hunger Vital Sign    Worried About Running Out of Food in the Last Year: Never true    Ran Out of Food in the Last Year: Never true  Transportation Needs: No Transportation Needs (11/09/2023)   PRAPARE - Administrator, Civil Service (Medical): No    Lack of Transportation (Non-Medical): No  Physical Activity: Inactive (11/09/2023)   Exercise Vital Sign  Days of Exercise per Week: 0 days    Minutes of Exercise per Session: 0 min  Stress: No Stress Concern Present (11/09/2023)   [employer] of Occupational Health - Occupational Stress Questionnaire    Feeling of Stress : Only a little  Social Connections: Socially Isolated (11/09/2023)   Social Connection and Isolation Panel [NHANES]    Frequency of Communication with Friends and Family: More than three times a week    Frequency of Social Gatherings with Friends and Family: More than three times a week    Attends Religious Services: Never    Database administrator or Organizations: No    Attends Banker Meetings: Never    Marital Status: Widowed    Tobacco Counseling Counseling given: Not Answered Tobacco comments: smoking cessation materials not required    Clinical Intake:  Pre-visit preparation completed: Yes  Pain : No/denies pain     BMI - recorded: 20 Nutritional Status: BMI of 19-24  Normal Nutritional Risks: None Diabetes: No  How often do you need to have someone help you when you read instructions, pamphlets, or other written materials from your doctor or pharmacy?: 1 - Never  Interpreter Needed?: No  Information entered by :: Kennedy Bucker, LPN   Activities of Daily Living     11/09/2023    3:13 PM  11/06/2023   10:10 AM  In your present state of health, do you have any difficulty performing the following activities:  Hearing? 0 0  Vision? 0 0  Difficulty concentrating or making decisions? 0 0  Walking or climbing stairs? 0 0  Comment R LEG WEAK   Dressing or bathing? 0 0  Doing errands, shopping? 0 0  Preparing Food and eating ? N N  Using the Toilet? N N  In the past six months, have you accidently leaked urine? N N  Do you have problems with loss of bowel control? N N  Managing your Medications? N N  Managing your Finances? N Y  Housekeeping or managing your Housekeeping? [place]    Patient Care Team: Alba Cory, MD as PCP - General (Family Medicine) Jeralyn Ruths, MD as Consulting Physician (Oncology) Dillingham, Alena Bills, DO as Attending Physician (Plastic Surgery) Ronney Asters Jackelyn Poling, RPH-CPP as Pharmacist Lockie Mola, MD as Referring Physician (Ophthalmology)  Indicate any recent Medical Services you may have received from other than [facility] providers in the past year (date may be approximate).     Assessment:   This is a routine wellness examination for Daneshia.  Hearing/Vision screen Hearing Screening - Comments:: NO AIDS Vision Screening - Comments:: READERS- [facility] DR. Inez Pilgrim   Goals Addressed             This Visit's Progress    DIET - EAT MORE FRUITS AND VEGETABLES         Depression Screen     11/09/2023    3:06 PM 10/25/2023    8:45 AM 08/01/2023   11:34 AM 02/13/2023   10:01 AM 01/09/2023   11:38 AM 11/04/2022   10:27 AM 05/11/2022   10:45 AM  PHQ 2/9 Scores  PHQ - 2 Score 2 0 0 0 0 0 0  PHQ- 9 Score 4 0 0 1 0  0    Fall Risk     11/09/2023    3:13 PM 11/06/2023   10:10 AM 10/25/2023    8:45 AM 08/01/2023   11:33 AM 02/13/2023   10:00 AM  Fall Risk  Falls in the past year? 0 0 0 0 0  Number falls in past yr: 0  0 0 0  Injury with Fall? 0 0 0 0 0  Risk for fall due to : No Fall Risks  No Fall Risks No Fall Risks    Follow up Falls prevention discussed;Falls evaluation completed  Falls prevention discussed;Education provided;Falls evaluation completed Falls prevention discussed     MEDICARE RISK AT HOME:  Medicare Risk at Home Any stairs in or around the home?: No If so, are there any without handrails?: No Home free of loose throw rugs in walkways, pet beds, electrical cords, etc?: Yes Adequate lighting in your home to reduce risk of falls?: Yes Life alert?: No Use of a cane, walker or w/c?: No Grab bars in the bathroom?: Yes Shower chair or bench in shower?: Yes Elevated toilet seat or a handicapped toilet?: Yes  TIMED UP AND GO:  Was the test performed?  No  Cognitive Function: 6CIT completed        11/09/2023    3:16 PM 11/04/2022   10:33 AM 12/26/2017    9:13 AM  6CIT Screen  What Year? 0 points 0 points 0 points  What month? 0 points 0 points 0 points  What time? 0 points 0 points 0 points  Count back from 20 0 points 0 points 0 points  Months in reverse 0 points 0 points 0 points  Repeat phrase 0 points 0 points 0 points  Total Score 0 points 0 points 0 points    Immunizations Immunization History  Administered Date(s) Administered   Tdap 08/08/2016    Screening Tests Health Maintenance  Topic Date Due   Zoster Vaccines- Shingrix (1 of 2) Never done   Colonoscopy  Never done   MAMMOGRAM  10/20/2021   Pneumonia Vaccine 61+ Years old (1 of 2 - PCV) 01/06/2024 (Originally 07/27/1957)   Medicare Annual Wellness (AWV)  11/08/2024   DTaP/Tdap/Td (2 - Td or Tdap) 08/08/2026   DEXA SCAN  Completed   Hepatitis C Screening  Completed   HPV VACCINES  Aged Out   INFLUENZA VACCINE  Discontinued   COVID-19 Vaccine  Discontinued    Health Maintenance  Health Maintenance Due  Topic Date Due   Zoster Vaccines- Shingrix (1 of 2) Never done   Colonoscopy  Never done   MAMMOGRAM  10/20/2021   Health Maintenance Items Addressed: DECLINED COLONOSCOPY MAMMOGRAM ORDERED  02/13/23  Additional Screening:  Vision Screening: Recommended annual ophthalmology exams for early detection of glaucoma and other disorders of the eye.  Dental Screening: Recommended annual dental exams for proper oral hygiene  Community Resource Referral / Chronic Care Management: CRR required this visit?  No   CCM required this visit?  No     Plan:     I have personally reviewed and noted the following in the patient's chart:   Medical and social history Use of alcohol, tobacco or illicit drugs  Current medications and supplements including opioid prescriptions. Patient is not currently taking opioid prescriptions. Functional ability and status Nutritional status Physical activity Advanced directives List of other physicians Hospitalizations, surgeries, and ER visits in previous 12 months Vitals Screenings to include cognitive, depression, and falls Referrals and appointments  In addition, I have reviewed and discussed with patient certain preventive protocols, quality metrics, and best practice recommendations. A written personalized care plan for preventive services as well as general preventive health recommendations were provided to patient.     Karissa Meenan S  Zachery Dauer, LPN   10/10/8525   After Visit Summary: (MyChart) Due to this being a telephonic visit, the after visit summary with patients personalized plan was offered to patient via MyChart   Notes: Nothing significant to report at this time.

## 2023-11-09 NOTE — Patient Instructions (Addendum)
 Patricia Galloway , Thank you for taking time to come for your Medicare Wellness Visit. I appreciate your ongoing commitment to your health goals. Please review the following plan we discussed and let me know if I can assist you in the future.   Referrals/Orders/Follow-Ups/Clinician Recommendations: NONE  You have an order for:  []   2D Mammogram  [x]   3D Mammogram  []   Bone Density     Please call for appointment:  [facility]  [address] [place] [address] [phone] [facility] [address] [place], [place] [address] [phone] [facility] [address] Geanie Logan [place], [place] [address] [phone]   Make sure to wear two-piece clothing.  No lotions, powders, or deodorants the day of the appointment. Make sure to bring picture ID and insurance card.  Bring list of medications you are currently taking including any supplements.   Schedule your [facility] screening mammogram through MyChart!   Log into your MyChart account.  Go to 'Visit' (or 'Appointments' if on mobile App) --> Schedule an Appointment  Under 'Select a Reason for Visit' choose the Mammogram Screening option.  Complete the pre-visit questions and select the time and place that best fits your schedule.   This is a list of the screening recommended for you and due dates:  Health Maintenance  Topic Date Due   Zoster (Shingles) Vaccine (1 of 2) Never done   Colon Cancer Screening  Never done   Mammogram  10/20/2021   Pneumonia Vaccine (1 of 2 - PCV) 01/06/2024*   Medicare Annual Wellness Visit  11/08/2024   DTaP/Tdap/Td vaccine (2 - Td or Tdap) 08/08/2026   DEXA scan (bone density measurement)  Completed   Hepatitis C Screening  Completed   HPV Vaccine  Aged Out   Flu Shot  Discontinued   COVID-19 Vaccine  Discontinued  *Topic was postponed. The date shown is not the original due  date.    Advanced directives: (In Chart) A copy of your advanced directives are scanned into your chart should your provider ever need it.  Next Medicare Annual Wellness Visit scheduled for next year: Yes   11/14/24 @ 2:30 PM BY VIDEO

## 2023-12-06 ENCOUNTER — Telehealth: Payer: Self-pay | Admitting: Pharmacist

## 2023-12-06 ENCOUNTER — Other Ambulatory Visit: Payer: Self-pay | Admitting: Pharmacist

## 2023-12-06 NOTE — Progress Notes (Unsigned)
   Outreach Note  12/06/2023 Name: Patricia Galloway MRN: [id number] DOB: 1951/06/14  Referred by: Alba Cory, MD  Was unable to reach patient via telephone today and have left HIPAA compliant voicemail asking patient to return my call.    Follow Up Plan: Will collaborate with Care Guide to outreach to schedule follow up with me  Estelle Grumbles, PharmD, [facility] [phone]

## 2023-12-13 ENCOUNTER — Telehealth: Payer: Self-pay

## 2023-12-13 NOTE — Progress Notes (Unsigned)
 Complex Care Management Care Guide Note  12/13/2023 Name: Patricia Galloway MRN: [id number] DOB: 01-04-1951  Patricia Galloway is a 73 y.o. year old female who is a primary care patient of Alba Cory, MD and is actively engaged with the care management team. I reached out to [name] by phone today to assist with re-scheduling  with the Pharmacist.  Follow up plan: Unsuccessful telephone outreach attempt made. A HIPAA compliant phone message was left for the patient providing contact information and requesting a return call.  Penne Lash , RMA     [facility]  [facility], [facility] Guide  Direct Dial: [phone]  Website: [email or web address]

## 2023-12-14 NOTE — Progress Notes (Signed)
 Complex Care Management Care Guide Note  12/14/2023 Name: Patricia Galloway MRN: [id number] DOB: 1951/01/06  Patricia Galloway is a 73 y.o. year old female who is a primary care patient of Alba Cory, MD and is actively engaged with the care management team. I reached out to [name] by phone today to assist with re-scheduling  with the Pharmacist.  Follow up plan: Telephone appointment with complex care management team member scheduled for:  01/12/2024  Penne Lash , RMA     [place]  [facility], [facility] Guide  Direct Dial: [phone]  Website: [email or web address]

## 2024-01-10 ENCOUNTER — Other Ambulatory Visit: Payer: Self-pay | Admitting: Pharmacist

## 2024-01-10 ENCOUNTER — Encounter: Payer: Self-pay | Admitting: Pharmacist

## 2024-01-10 DIAGNOSIS — J4489 Other specified chronic obstructive pulmonary disease: Secondary | ICD-10-CM

## 2024-01-10 NOTE — Progress Notes (Unsigned)
   01/10/2024 Name: Patricia Galloway MRN: [id number] DOB: 08-Aug-1951  Chief Complaint  Patient presents with   Medication Assistance   Medication Management    Patricia Galloway is a 73 y.o. year old female who presented for a telephone visit.   They were referred to the pharmacist by their PCP for assistance in managing medication access.      Subjective:   Care Team: Primary Care Provider: Sowles, Krichna, MD ; Next Scheduled Visit: 01/31/2024    Medication Access/Adherence  Current Pharmacy:  [facility] - [place], [place] - [address] [place] [address] Phone: [phone] Fax: [phone]   Patient reports affordability concerns with their medications: Yes  Patient reports access/transportation concerns to their pharmacy: No  Patient reports adherence concerns with their medications:  No     Reports has    Denies using weekly pillbox     COPD:   Current medications:  - Breo - 1 puff daily - montelukast  10 mg nightly at bedtime - albuterol  HFA inhaler - 2 puffs every 6 hours as needed for wheezing or shortness of breath   Medications tried in the past: Trelegy ("took my breath away"), Incruse   Denies needing her albuterol  inhaler recently, but carries this with her at all time     Objective:   Lab Results  Component Value Date   CREATININE 0.63 08/01/2023   BUN 23 08/01/2023   NA 137 08/01/2023   K 3.6 08/01/2023   CL 99 08/01/2023   CO2 30 08/01/2023    Lab Results  Component Value Date   CHOL 262 (H) 08/01/2023   HDL 98 08/01/2023   LDLCALC 146 (H) 08/01/2023   TRIG 80 08/01/2023   CHOLHDL 2.7 08/01/2023    Medications Reviewed Today   Medications were not reviewed in this encounter       Assessment/Plan:   COPD/Asthma: - Reviewed appropriate inhaler technique including importance of rinsing and spitting out after each use - From review of information from [employer] website for patient's health  plan, note that Patricia Galloway is a tier 3 option through her coverage and in 2025 patient's tier 3 copayment is 25% coinsurance. Note fluticasone /salmeterol (generic Advair) diskus is a preferred tier 2 alternative through the plan Patient request to switch from Breo to fluticasone /salmeterol (generic Advair) diskus for cost savings Will collaborate with PCP to request provider send prescription for generic Advair to pharmacy for her - Discuss other opportunities for cost savings including use of preferred [facility] or [facility]). Patient prefers to continue to use [facility] - Note patient does not currently meet criteria for [facility] patient assistance as she has not spent $600 out of pocket on medications for the calendar year     Follow Up Plan: Clinical Pharmacist will follow up with patient by telephone on 03/20/2024 at 1:00 PM    Patricia Galloway, PharmD, [facility] [phone]

## 2024-01-11 NOTE — Patient Instructions (Signed)
 Goals Addressed             This Visit's Progress    Pharmacy Goals       The following is the web address for the "how to use" video for the generic Advair inhaler.   [email or web address]   Please copy and paste the link into your web browser to review. Please let our office or your pharmacy know if you have any further questions about this medication.    Thank you!   Arthur Lash, PharmD, [facility] [phone]

## 2024-01-12 ENCOUNTER — Other Ambulatory Visit: Payer: Self-pay | Admitting: Pharmacist

## 2024-01-25 ENCOUNTER — Other Ambulatory Visit: Payer: Self-pay | Admitting: Family Medicine

## 2024-01-30 ENCOUNTER — Other Ambulatory Visit: Payer: Self-pay | Admitting: Family Medicine

## 2024-01-31 ENCOUNTER — Other Ambulatory Visit (HOSPITAL_COMMUNITY): Payer: Self-pay

## 2024-01-31 ENCOUNTER — Encounter: Payer: Self-pay | Admitting: Oncology

## 2024-01-31 ENCOUNTER — Other Ambulatory Visit: Payer: Self-pay | Admitting: Family Medicine

## 2024-01-31 ENCOUNTER — Telehealth: Payer: Self-pay | Admitting: Pharmacy Technician

## 2024-01-31 ENCOUNTER — Ambulatory Visit (INDEPENDENT_AMBULATORY_CARE_PROVIDER_SITE_OTHER): Payer: Self-pay | Admitting: Family Medicine

## 2024-01-31 ENCOUNTER — Encounter: Payer: Self-pay | Admitting: Family Medicine

## 2024-01-31 VITALS — BP 136/82 | HR 88 | Resp 16 | Ht 63.0 in | Wt 119.4 lb

## 2024-01-31 DIAGNOSIS — R5383 Other fatigue: Secondary | ICD-10-CM

## 2024-01-31 DIAGNOSIS — I10 Essential (primary) hypertension: Secondary | ICD-10-CM

## 2024-01-31 DIAGNOSIS — I700 Atherosclerosis of aorta: Secondary | ICD-10-CM | POA: Diagnosis not present

## 2024-01-31 DIAGNOSIS — F419 Anxiety disorder, unspecified: Secondary | ICD-10-CM | POA: Diagnosis not present

## 2024-01-31 DIAGNOSIS — M810 Age-related osteoporosis without current pathological fracture: Secondary | ICD-10-CM

## 2024-01-31 DIAGNOSIS — Z9889 Other specified postprocedural states: Secondary | ICD-10-CM | POA: Diagnosis not present

## 2024-01-31 DIAGNOSIS — Z85828 Personal history of other malignant neoplasm of skin: Secondary | ICD-10-CM | POA: Diagnosis not present

## 2024-01-31 DIAGNOSIS — F325 Major depressive disorder, single episode, in full remission: Secondary | ICD-10-CM

## 2024-01-31 DIAGNOSIS — I2510 Atherosclerotic heart disease of native coronary artery without angina pectoris: Secondary | ICD-10-CM | POA: Diagnosis not present

## 2024-01-31 DIAGNOSIS — J4489 Other specified chronic obstructive pulmonary disease: Secondary | ICD-10-CM

## 2024-01-31 DIAGNOSIS — K861 Other chronic pancreatitis: Secondary | ICD-10-CM

## 2024-01-31 MED ORDER — DIAZEPAM 5 MG PO TABS: 5.0000 mg | ORAL_TABLET | Freq: Every day | ORAL | 0 refills | Status: DC | PRN

## 2024-01-31 MED ORDER — AMLODIPINE BESYLATE 5 MG PO TABS: 5.0000 mg | ORAL_TABLET | Freq: Every day | ORAL | 1 refills | Status: DC

## 2024-01-31 MED ORDER — MONTELUKAST SODIUM 10 MG PO TABS: 10.0000 mg | ORAL_TABLET | Freq: Every day | ORAL | 1 refills | Status: DC

## 2024-01-31 MED ORDER — BENZONATATE 100 MG PO CAPS: 100.0000 mg | ORAL_CAPSULE | Freq: Three times a day (TID) | ORAL | 0 refills | Status: AC | PRN

## 2024-01-31 MED ORDER — LEVOCETIRIZINE DIHYDROCHLORIDE 5 MG PO TABS: 5.0000 mg | ORAL_TABLET | Freq: Every evening | ORAL | 1 refills | Status: DC

## 2024-01-31 NOTE — Telephone Encounter (Signed)
 Not on current med list.

## 2024-01-31 NOTE — Telephone Encounter (Signed)
 Pharmacy Patient Advocate Encounter   Received notification from CoverMyMeds that prior authorization for DIAZEPAM  5 MG TABLETS is required/requested.   Insurance verification completed.   The patient is insured through [facility] .   Per test claim: PA required; PA submitted to above mentioned insurance via CoverMyMeds Key/confirmation #/EOC [id number] Status is pending

## 2024-01-31 NOTE — Progress Notes (Signed)
 Name: Patricia Galloway   MRN: [id number]    DOB: March 03, 1951   Date:01/31/2024       Progress Note  Subjective  Chief Complaint  Chief Complaint  Patient presents with   Medical Management of Chronic Issues   Discussed the use of AI scribe software for clinical note transcription with the patient, who gave verbal consent to proceed.  History of Present Illness Patricia Galloway is a 73 year old female with COPD and asthma who presents with persistent fatigue and respiratory issues.  She has been experiencing persistent fatigue and low energy levels since a severe respiratory infection in February. She feels exhausted with minimal exertion and frequently needs to sit down due to fatigue. Her history of COPD and asthma may contribute to these symptoms.  She continues to experience respiratory issues, including shortness of breath and a productive cough. Her breathing worsens with exposure to environmental allergens, such as her cat and old carpet in her home. She uses Breo 200/25 mcg daily for respiratory symptoms and montelukast  nightly for allergies.   She has a history of emphysema noted on a CT scan in 2022. She smoked in the past and was exposed to secondhand smoke from her husband, who has since passed away. She lives in a house with a wet basement and old carpet, which may exacerbate her respiratory symptoms.  She experiences anxiety, particularly when preparing to leave the house or during activities like showering or dusting. She associates some of this anxiety with memories of her late husband. She uses Valium  sparingly, with about five or six tablets remaining from a prescription filled in November 30 tablets.  She has a history of basal cell carcinoma on her right leg, which required surgery followed by chemotherapy. She reports discomfort in the area but it has been stable since treatment.She has not had recent follow-up with her dermatologist or oncologist.  She has a history of iron   deficiency anemia, for which she previously received iron infusions.    She also has a history of atherosclerosis of aorta and an aneurysm of the ascending aorta, though recent tests did not show the aneurysm, but sclerosis of aortic valve. She has not been taking aspirin regularly due to bruising issues.    Patient Active Problem List   Diagnosis Date Noted   Senile purpura (HCC) 02/13/2023   Depression, major, in remission (HCC) 05/11/2022   COPD with asthma (HCC) 05/11/2022   Osteoporosis without current pathological fracture 05/11/2022   Dyslipidemia 05/11/2022   History of basal cell carcinoma (BCC) excision 05/11/2022   Red blood cell antibody positive 08/03/2021   Mass of thigh 06/11/2021   Basal cell carcinoma 03/29/2017   Thoracic aortic aneurysm without rupture (HCC) 10/31/2016   Atherosclerosis of aorta (HCC) 10/31/2016   Chronic pancreatitis (HCC) 10/31/2016   Pulmonary nodule 10/31/2016   Centrilobular emphysema (HCC) 10/31/2016   Iron deficiency anemia due to chronic blood loss 10/24/2016   Basal cell carcinoma, leg, right 08/24/2016   Hyperglycemia 08/01/2016   Chronic thoracic back pain 08/04/2015   Panic attack 08/04/2015   White coat syndrome with hypertension 08/04/2015   Major depression, recurrent (HCC) 08/04/2015    Past Surgical History:  Procedure Laterality Date   BASAL CELL CARCINOMA EXCISION Right 03/29/2017   Procedure: EXCISION OF RIGHT LEG BASEL CELL CARCINOMA;  Surgeon: Thornell Flirt, DO;  Location: [facility] OR;  Service: Plastics;  Laterality: Right;   COLON SURGERY  2002   colostomy for 10 months after a  perfurated sigmoid    COLOSTOMY REVERSAL  2003   EMBOLIZATION (CATH LAB) Right 10/12/2016   Procedure: Embolization;  Surgeon: Jackquelyn Mass, MD;  Location: [facility] INVASIVE CV LAB;  Service: Cardiovascular;  Laterality: Right;   etopic  9629,5284   INCISION AND DRAINAGE OF WOUND Right 03/30/2017   Procedure: IRRIGATION AND DEBRIDEMENT WOUND;  VAC PLACEMENT;  Surgeon: Thornell Flirt, DO;  Location: [facility] ORS;  Service: Plastics;  Laterality: Right;   TONSILLECTOMY      Family History  Problem Relation Age of Onset   Dementia Mother    Aortic stenosis Mother    Osteoporosis Mother    Dementia Father    Diabetes Father    Hyperlipidemia Father    Hypertension Father    Aneurysm Sister    Hyperlipidemia Sister    Hypertension Sister    Diabetes Sister    Breast cancer Neg Hx     Social History   Tobacco Use   Smoking status: Former    Current packs/day: 0.00    Average packs/day: 0.5 packs/day for 25.9 years (13.0 ttl pk-yrs)    Types: Cigarettes    Start date: 09/05/1972    Quit date: 08/03/1998    Years since quitting: 25.5   Smokeless tobacco: Never   Tobacco comments:    smoking cessation materials not required  Substance Use Topics   Alcohol use: No    Alcohol/week: 0.0 standard drinks of alcohol     Current Outpatient Medications:    albuterol  (VENTOLIN  HFA) 108 (90 Base) MCG/ACT inhaler, INHALE 2 PUFFS INTO LUNGS EVERY 6 HOURS AS NEEDED FOR WHEEZING OR SHORTNESS OF BREATH, Disp: 18 g, Rfl: 1   amLODipine  (NORVASC ) 5 MG tablet, TAKE 1 TABLET BY MOUTH ONCE DAILY, Disp: 90 tablet, Rfl: 1   benzonatate  (TESSALON ) 100 MG capsule, Take 1-2 capsules (100-200 mg total) by mouth 3 (three) times daily as needed for cough., Disp: 40 capsule, Rfl: 0   BREO ELLIPTA  200-25 MCG/ACT AEPB, Inhale 1 puff into the lungs daily., Disp: , Rfl:    Cholecalciferol (VITAMIN D3) 250 MCG (10000 UT) TABS, Take 1 tablet by mouth 2 (two) times a week., Disp: , Rfl:    diazepam  (VALIUM ) 5 MG tablet, Take 1 tablet (5 mg total) by mouth daily as needed for anxiety. 30 minutes before doctor's visit, Disp: 30 tablet, Rfl: 0   magnesium 30 MG tablet, Take 30 mg by mouth daily., Disp: , Rfl:    montelukast  (SINGULAIR ) 10 MG tablet, TAKE 1 TABLET BY MOUTH AT BEDTIME, Disp: 90 tablet, Rfl: 1   Multiple Vitamin (MULTIVITAMIN) capsule, Take 2  capsules by mouth daily., Disp: , Rfl:    OVER THE COUNTER MEDICATION, Take 1 tablet by mouth 2 (two) times daily. Nutri-Calm, Disp: , Rfl:    OVER THE COUNTER MEDICATION, Life extension bone strength / life extension skin restore ceramide / life extension super bio curcumin, Disp: , Rfl:    Wound Cleansers (VASHE WOUND) 0.033 % SOLN, Apply 1 each topically daily at 12 noon., Disp: , Rfl:    fluticasone -salmeterol (ADVAIR) 100-50 MCG/ACT AEPB, Inhale 1 puff into the lungs 2 (two) times daily. (Patient not taking: Reported on 01/31/2024), Disp: 1 each, Rfl: 0  Allergies  Allergen Reactions   Penicillins Anaphylaxis    Has patient had a PCN reaction causing immediate rash, facial/tongue/throat swelling, SOB or lightheadedness with hypotension: Yes Has patient had a PCN reaction causing severe rash involving mucus membranes or skin necrosis: No Has  patient had a PCN reaction that required hospitalization: No Has patient had a PCN reaction occurring within the last 10 years: No If all of the above answers are "NO", then may proceed with Cephalosporin use.    Codeine Nausea And Vomiting   Latex Itching   Sulfa Antibiotics Other (See Comments)    TOPICAL-SULFA CAUSED BLISTERS   Tape Other (See Comments)    Reaction:  Blisters and burning  Pt states that paper tape is okay.     Xopenex  [Levalbuterol  Hcl] Anxiety    I personally reviewed active problem list, medication list, allergies with the patient/caregiver today.   ROS  Ten systems reviewed and is negative except as mentioned in HPI    Objective Physical Exam Constitutional: Patient appears well-developed and well-nourished.  No distress.  HEENT: head atraumatic, normocephalic, pupils equal and reactive to light, neck supple Cardiovascular: Normal rate, regular rhythm and normal heart sounds.  No murmur heard. No BLE edema. Pulmonary/Chest: Effort normal and breath sounds normal. No respiratory distress. Abdominal: Soft.  There is  no tenderness. Psychiatric: Patient has a normal mood and affect. behavior is normal. Judgment and thought content normal.    Vitals:   01/31/24 1035  BP: 136/82  Pulse: 88  Resp: 16  Weight: 119 lb 6.4 oz (54.2 kg)  Height: 5\' 3"  (1.6 m)    Body mass index is 21.15 kg/m.    PHQ2/9:    01/31/2024   10:32 AM 11/09/2023    3:06 PM 10/25/2023    8:45 AM 08/01/2023   11:34 AM 02/13/2023   10:01 AM  Depression screen PHQ 2/9  Decreased Interest 0 1 0 0 0  Down, Depressed, Hopeless 0 1 0 0 0  PHQ - 2 Score 0 2 0 0 0  Altered sleeping 0 1 0 0 0  Tired, decreased energy 0 1 0 0 1  Change in appetite 0 0 0 0 0  Feeling bad or failure about yourself  0 0 0 0 0  Trouble concentrating 0 0 0 0 0  Moving slowly or fidgety/restless 0 0 0 0 0  Suicidal thoughts 0 0 0 0 0  PHQ-9 Score 0 4 0 0 1  Difficult doing work/chores Not difficult at all Not difficult at all Not difficult at all  Somewhat difficult    phq 9 is negative  Fall Risk:    01/31/2024   10:29 AM 11/09/2023    3:13 PM 11/06/2023   10:10 AM 10/25/2023    8:45 AM 08/01/2023   11:33 AM  Fall Risk   Falls in the past year? 0 0 0 0 0  Number falls in past yr: 0 0  0 0  Injury with Fall? 0 0 0 0 0  Risk for fall due to : No Fall Risks No Fall Risks  No Fall Risks No Fall Risks  Follow up Education provided;Falls prevention discussed;Falls evaluation completed Falls prevention discussed;Falls evaluation completed  Falls prevention discussed;Education provided;Falls evaluation completed Falls prevention discussed      Assessment & Plan COPD with asthma Chronic condition with recent exacerbation. Persistent dyspnea and fatigue. Environmental allergens may contribute. Allergies exacerbate asthma. - Continue Breo 200/25 mcg daily. - Continue montelukast . - Prescribe Xyzal for allergy management. - Refer to pulmonologist. - Advise removal of old carpet and manage allergen exposure. - Advise wearing a mask when grooming the  cat.  Emphysema Chronic condition with worsening dyspnea. History of smoking and secondhand smoke exposure. - Refer to pulmonologist.  Anxiety Experiencing anxiety with physical symptoms. Prefers not to take daily medication. Uses Valium  sparingly. - Refill Valium  for acute episodes.  Iron deficiency anemia/now with worsening of fatigue Previously resolved with iron infusions. Current fatigue may be related to anemia or other deficiencies. - Order CBC, B12, folate, vitamin D , and thyroid  function tests.  Osteoporosis Chronic condition. Prefers management with supplements. No recent bone density scan. - Discuss bone density scan. - Continue bone restore supplement.  Atherosclerosis of aorta Known condition with previous plaque formation. Cardiologist referral may be needed if cardiac involvement in dyspnea is suggested. - Consider echocardiogram for further evaluation of aortic valve sclerosis and also if pulmonologist evaluation suggests cardiac involvement.  Aneurysm of ascending aorta Previously mentioned but not found on recent CT. Monitoring advised. - Consider echocardiogram if pulmonologist evaluation suggests cardiac involvement.  Basal cell carcinoma of the leg Recurrent basal cell carcinoma of the right leg. Last seen by dermatologist in 2023. - Review last note from dermatologist. - Advise follow-up with dermatologist as needed.

## 2024-01-31 NOTE — Telephone Encounter (Signed)
 Pharmacy Patient Advocate Encounter  Received notification from [facility] that Prior Authorization for DIAZEPAM  5 MG TABLETS has been APPROVED from 09/06/23 to 03/01/24   PA #/Case ID/Reference #: [id number]

## 2024-02-01 ENCOUNTER — Ambulatory Visit: Payer: Self-pay | Admitting: Family Medicine

## 2024-02-01 LAB — VITAMIN D 25 HYDROXY (VIT D DEFICIENCY, FRACTURES): Vit D, 25-Hydroxy: 28 ng/mL — ABNORMAL LOW (ref 30–100)

## 2024-02-01 LAB — COMPREHENSIVE METABOLIC PANEL WITH GFR
AG Ratio: 1.8 (calc) (ref 1.0–2.5)
ALT: 13 U/L (ref 6–29)
AST: 15 U/L (ref 10–35)
Albumin: 4.8 g/dL (ref 3.6–5.1)
Alkaline phosphatase (APISO): 98 U/L (ref 37–153)
BUN: 18 mg/dL (ref 7–25)
CO2: 28 mmol/L (ref 20–32)
Calcium: 9.9 mg/dL (ref 8.6–10.4)
Chloride: 102 mmol/L (ref 98–110)
Creat: 0.65 mg/dL (ref 0.60–1.00)
Globulin: 2.7 g/dL (ref 1.9–3.7)
Glucose, Bld: 100 mg/dL — ABNORMAL HIGH (ref 65–99)
Potassium: 4.5 mmol/L (ref 3.5–5.3)
Sodium: 139 mmol/L (ref 135–146)
Total Bilirubin: 0.3 mg/dL (ref 0.2–1.2)
Total Protein: 7.5 g/dL (ref 6.1–8.1)
eGFR: 93 mL/min/{1.73_m2} (ref >=60)

## 2024-02-01 LAB — B12 AND FOLATE PANEL
Folate: 12.3 ng/mL
Vitamin B-12: 443 pg/mL (ref 200–1100)

## 2024-02-01 LAB — CBC WITH DIFFERENTIAL/PLATELET
Absolute Lymphocytes: 2088 {cells}/uL (ref 850–3900)
Absolute Monocytes: 936 {cells}/uL (ref 200–950)
Basophils Absolute: 63 {cells}/uL (ref 0–200)
Basophils Relative: 0.7 %
Eosinophils Absolute: 144 {cells}/uL (ref 15–500)
Eosinophils Relative: 1.6 %
HCT: 43.8 % (ref 35.0–45.0)
Hemoglobin: 14.2 g/dL (ref 11.7–15.5)
MCH: 28 pg (ref 27.0–33.0)
MCHC: 32.4 g/dL (ref 32.0–36.0)
MCV: 86.2 fL (ref 80.0–100.0)
MPV: 11.3 fL (ref 7.5–12.5)
Monocytes Relative: 10.4 %
Neutro Abs: 5769 {cells}/uL (ref 1500–7800)
Neutrophils Relative %: 64.1 %
Platelets: 344 10*3/uL (ref 140–400)
RBC: 5.08 10*6/uL (ref 3.80–5.10)
RDW: 13.8 % (ref 11.0–15.0)
Total Lymphocyte: 23.2 %
WBC: 9 10*3/uL (ref 3.8–10.8)

## 2024-02-01 LAB — TSH: TSH: 3.78 m[IU]/L (ref 0.40–4.50)

## 2024-02-02 ENCOUNTER — Other Ambulatory Visit (HOSPITAL_COMMUNITY): Payer: Self-pay

## 2024-02-16 ENCOUNTER — Other Ambulatory Visit: Payer: Self-pay | Admitting: Family Medicine

## 2024-02-16 DIAGNOSIS — J4540 Moderate persistent asthma, uncomplicated: Secondary | ICD-10-CM

## 2024-02-16 DIAGNOSIS — J432 Centrilobular emphysema: Secondary | ICD-10-CM

## 2024-03-20 ENCOUNTER — Other Ambulatory Visit: Payer: Self-pay | Admitting: Pharmacist

## 2024-04-24 ENCOUNTER — Other Ambulatory Visit: Payer: Self-pay | Admitting: Pharmacist

## 2024-04-24 ENCOUNTER — Telehealth: Payer: Self-pay | Admitting: Pharmacist

## 2024-04-24 DIAGNOSIS — J4489 Other specified chronic obstructive pulmonary disease: Secondary | ICD-10-CM

## 2024-04-24 NOTE — Patient Instructions (Signed)
 Goals Addressed             This Visit's Progress    Pharmacy Goals       The following is the web address for the "how to use" video for the generic Advair inhaler.   [email or web address]   Please copy and paste the link into your web browser to review. Please let our office or your pharmacy know if you have any further questions about this medication.    Thank you!   Arthur Lash, PharmD, [facility] [phone]

## 2024-04-24 NOTE — Progress Notes (Signed)
   Outreach Note  04/24/2024 Name: Patricia Galloway MRN: [id number] DOB: December 01, 1950  Referred by: Sowles, Krichna, MD  Was unable to reach patient via telephone today and have left HIPAA compliant voicemail asking patient to return my call.    Sharyle Sia, PharmD, [facility] [phone]

## 2024-04-24 NOTE — Progress Notes (Signed)
 04/24/2024 Name: Patricia Galloway MRN: [id number] DOB: Nov 30, 1950  Chief Complaint  Patient presents with   Medication Assistance    Patricia Galloway is a 73 y.o. year old female who presented for a telephone visit.   They were referred to the pharmacist by their PCP for assistance in managing medication access.      Subjective:   Care Team: Primary Care Provider: Sowles, Krichna, MD ; Next Scheduled Visit: 08/05/2024  Medication Access/Adherence  Current Pharmacy:  [facility] - [place], [place] - [address] [place] [address] Phone: [phone] Fax: [phone]   Patient reports affordability concerns with their medications: Yes  Patient reports access/transportation concerns to their pharmacy: No  Patient reports adherence concerns with their medications:  No    Patient advises she did not complete the Extra Help subsidy application as planned as realized she is above the income/assets limit for this subsidy  Shares that her biggest concern today is a financial barrier that she is having since a lawyer in [place] took action to garnish her income/assets.    COPD:   Current medications:  - Breo - 1 puff daily - montelukast  10 mg nightly at bedtime - albuterol  HFA inhaler - 2 puffs every 6 hours as needed for wheezing or shortness of breath   Medications tried in the past: Trelegy (took my breath away), Incruse   Reports that she picked up generic Advair inhaler, but has not yet started using this because her pharmacy continued to send her further refills of the Breo inhaler   Objective:   Lab Results  Component Value Date   CREATININE 0.65 01/31/2024   BUN 18 01/31/2024   NA 139 01/31/2024   K 4.5 01/31/2024   CL 102 01/31/2024   CO2 28 01/31/2024    Lab Results  Component Value Date   CHOL 262 (H) 08/01/2023   HDL 98 08/01/2023   LDLCALC 146 (H) 08/01/2023   TRIG 80 08/01/2023   CHOLHDL 2.7 08/01/2023    Current  Outpatient Medications on File Prior to Visit  Medication Sig Dispense Refill   albuterol  (VENTOLIN  HFA) 108 (90 Base) MCG/ACT inhaler INHALE 2 PUFFS INTO LUNGS EVERY 6 HOURS AS NEEDED FOR WHEEZING OR SHORTNESS OF BREATH 18 g 1   amLODipine  (NORVASC ) 5 MG tablet Take 1 tablet (5 mg total) by mouth daily. 90 tablet 1   benzonatate  (TESSALON ) 100 MG capsule Take 1-2 capsules (100-200 mg total) by mouth 3 (three) times daily as needed for cough. 60 capsule 0   Cholecalciferol (VITAMIN D3) 250 MCG (10000 UT) TABS Take 1 tablet by mouth 2 (two) times a week.     diazepam  (VALIUM ) 5 MG tablet Take 1 tablet (5 mg total) by mouth daily as needed for anxiety. 30 minutes before doctor's visit 30 tablet 0   fluticasone  furoate-vilanterol (BREO ELLIPTA ) 200-25 MCG/ACT AEPB INHALE 1 PUFF INTO THE LUNGS ONCE DAILY AS DIRECTED. 180 each 1   levocetirizine (XYZAL ) 5 MG tablet Take 1 tablet (5 mg total) by mouth every evening. 90 tablet 1   magnesium 30 MG tablet Take 30 mg by mouth daily.     montelukast  (SINGULAIR ) 10 MG tablet Take 1 tablet (10 mg total) by mouth at bedtime. 90 tablet 1   Multiple Vitamin (MULTIVITAMIN) capsule Take 2 capsules by mouth daily.     OVER THE COUNTER MEDICATION Take 1 tablet by mouth 2 (two) times daily. Nutri-Calm     OVER THE  COUNTER MEDICATION Life extension bone strength / life extension skin restore ceramide / life extension super bio curcumin     Wound Cleansers (VASHE WOUND) 0.033 % SOLN Apply 1 each topically daily at 12 noon.     No current facility-administered medications on file prior to visit.      Assessment/Plan:   As requested by patient, will place referral to Social Worker Chrystal Land to request outreach for any guidance on resources/strategies to help with financial barrier  COPD/Asthma: - Reviewed appropriate inhaler technique including importance of rinsing and spitting out after each use of maintenance inhaler - Note from review of information from  [employer] for patient's health plan, note that [place] is a tier 3 option through her coverage and in 2025 patient's tier 3 copayment is 25% coinsurance. Note fluticasone /salmeterol (generic Advair) diskus is a preferred tier 2 alternative through the plan - Patient denies needing assistance with communicating with her pharmacy. States will contact [facility] to request that they stop refilling the Breo for now    Follow Up Plan:   Patient denies further medication questions or concerns today Provide patient with contact information for clinic pharmacist to contact if needed in future for medication questions/concerns    Sharyle Sia, PharmD, [facility] [phone]

## 2024-05-13 DIAGNOSIS — Z87891 Personal history of nicotine dependence: Secondary | ICD-10-CM | POA: Diagnosis not present

## 2024-05-13 DIAGNOSIS — Z882 Allergy status to sulfonamides status: Secondary | ICD-10-CM | POA: Diagnosis not present

## 2024-05-13 DIAGNOSIS — Z7951 Long term (current) use of inhaled steroids: Secondary | ICD-10-CM | POA: Diagnosis not present

## 2024-05-13 DIAGNOSIS — J449 Chronic obstructive pulmonary disease, unspecified: Secondary | ICD-10-CM | POA: Diagnosis not present

## 2024-05-13 DIAGNOSIS — S8991XA Unspecified injury of right lower leg, initial encounter: Secondary | ICD-10-CM | POA: Diagnosis not present

## 2024-05-13 DIAGNOSIS — Z880 Allergy status to penicillin: Secondary | ICD-10-CM | POA: Diagnosis not present

## 2024-05-13 DIAGNOSIS — Z885 Allergy status to narcotic agent status: Secondary | ICD-10-CM | POA: Diagnosis not present

## 2024-05-13 DIAGNOSIS — S52571A Other intraarticular fracture of lower end of right radius, initial encounter for closed fracture: Secondary | ICD-10-CM | POA: Diagnosis not present

## 2024-05-13 DIAGNOSIS — W010XXA Fall on same level from slipping, tripping and stumbling without subsequent striking against object, initial encounter: Secondary | ICD-10-CM | POA: Diagnosis not present

## 2024-05-13 DIAGNOSIS — E785 Hyperlipidemia, unspecified: Secondary | ICD-10-CM | POA: Diagnosis not present

## 2024-05-13 DIAGNOSIS — I10 Essential (primary) hypertension: Secondary | ICD-10-CM | POA: Diagnosis not present

## 2024-05-13 DIAGNOSIS — S52531A Colles' fracture of right radius, initial encounter for closed fracture: Secondary | ICD-10-CM | POA: Diagnosis not present

## 2024-05-13 DIAGNOSIS — M79641 Pain in right hand: Secondary | ICD-10-CM | POA: Diagnosis not present

## 2024-05-13 DIAGNOSIS — Z79899 Other long term (current) drug therapy: Secondary | ICD-10-CM | POA: Diagnosis not present

## 2024-05-13 DIAGNOSIS — M25561 Pain in right knee: Secondary | ICD-10-CM | POA: Diagnosis not present

## 2024-05-13 DIAGNOSIS — M810 Age-related osteoporosis without current pathological fracture: Secondary | ICD-10-CM | POA: Diagnosis not present

## 2024-05-27 DIAGNOSIS — S52501A Unspecified fracture of the lower end of right radius, initial encounter for closed fracture: Secondary | ICD-10-CM | POA: Diagnosis not present

## 2024-05-27 DIAGNOSIS — S52571A Other intraarticular fracture of lower end of right radius, initial encounter for closed fracture: Secondary | ICD-10-CM | POA: Diagnosis not present

## 2024-06-21 ENCOUNTER — Telehealth: Payer: Self-pay

## 2024-06-21 ENCOUNTER — Other Ambulatory Visit: Payer: Self-pay | Admitting: Family Medicine

## 2024-06-21 NOTE — Telephone Encounter (Unsigned)
 Copied from CRM #[id number]. Topic: Appointments - Scheduling Inquiry for Clinic >> Jun 21, 2024  2:13 PM Antwanette L wrote: Reason for CRM: Cathlyn a healthcare advocate from [employer] is calling on behalf of the patient, who recently fractured her right arm. He is requesting a bone density scan to assess bone health. According to [facility] guidelines, patients aged 73-85 are recommended to have a screening within 6 months of a fracture, and insurance will cover 100% of the cost. The scan needs to completed by November 09, 2024.Please review and initiate the referral as appropriate. Cathlyn can be contacted at [phone]

## 2024-06-24 DIAGNOSIS — M25531 Pain in right wrist: Secondary | ICD-10-CM | POA: Diagnosis not present

## 2024-06-24 DIAGNOSIS — M25631 Stiffness of right wrist, not elsewhere classified: Secondary | ICD-10-CM | POA: Diagnosis not present

## 2024-06-24 DIAGNOSIS — S52571D Other intraarticular fracture of lower end of right radius, subsequent encounter for closed fracture with routine healing: Secondary | ICD-10-CM | POA: Diagnosis not present

## 2024-06-24 DIAGNOSIS — X58XXXD Exposure to other specified factors, subsequent encounter: Secondary | ICD-10-CM | POA: Diagnosis not present

## 2024-06-24 DIAGNOSIS — S52571A Other intraarticular fracture of lower end of right radius, initial encounter for closed fracture: Secondary | ICD-10-CM | POA: Diagnosis not present

## 2024-06-24 DIAGNOSIS — S52501D Unspecified fracture of the lower end of right radius, subsequent encounter for closed fracture with routine healing: Secondary | ICD-10-CM | POA: Diagnosis not present

## 2024-07-12 ENCOUNTER — Other Ambulatory Visit: Payer: Self-pay | Admitting: Family Medicine

## 2024-07-12 DIAGNOSIS — J432 Centrilobular emphysema: Secondary | ICD-10-CM

## 2024-07-12 DIAGNOSIS — J4540 Moderate persistent asthma, uncomplicated: Secondary | ICD-10-CM

## 2024-07-15 ENCOUNTER — Other Ambulatory Visit: Payer: Self-pay

## 2024-07-15 DIAGNOSIS — M810 Age-related osteoporosis without current pathological fracture: Secondary | ICD-10-CM

## 2024-07-19 ENCOUNTER — Other Ambulatory Visit: Payer: Self-pay | Admitting: Family Medicine

## 2024-07-23 ENCOUNTER — Other Ambulatory Visit: Payer: Self-pay | Admitting: Family Medicine

## 2024-07-23 DIAGNOSIS — J4489 Other specified chronic obstructive pulmonary disease: Secondary | ICD-10-CM

## 2024-07-23 DIAGNOSIS — S52571D Other intraarticular fracture of lower end of right radius, subsequent encounter for closed fracture with routine healing: Secondary | ICD-10-CM | POA: Diagnosis not present

## 2024-07-26 NOTE — Telephone Encounter (Signed)
 Requested Prescriptions  Pending Prescriptions Disp Refills   levocetirizine (XYZAL ) 5 MG tablet [Pharmacy Med Name: LEVOCETIRIZINE DIHYDROCHLORIDE  5 MG] 90 tablet 0    Sig: TAKE 1 TABLET BY MOUTH EVERY EVENING     Ear, Nose, and Throat:  Antihistamines - levocetirizine dihydrochloride  Passed - 07/26/2024  9:51 AM      Passed - Cr in normal range and within 360 days    Creat  Date Value Ref Range Status  01/31/2024 0.65 0.60 - 1.00 mg/dL Final   Creatinine, Urine  Date Value Ref Range Status  02/13/2020 32 20 - 275 mg/dL Final         Passed - eGFR is 10 or above and within 360 days    GFR, Est African American  Date Value Ref Range Status  02/13/2020 93 > OR = 60 mL/min/1.23m2 Final   GFR, Est Non African American  Date Value Ref Range Status  02/13/2020 81 > OR = 60 mL/min/1.84m2 Final   eGFR  Date Value Ref Range Status  01/31/2024 93 > OR = 60 mL/min/1.64m2 Final         Passed - Valid encounter within last 12 months    Recent Outpatient Visits           5 months ago COPD with asthma [facility])   [place] [facility] Mire, MD   9 months ago Acute URI   [facility] Mire, MD       Future Appointments             In 1 week Sowles, Krichna, MD [facility] [place]

## 2024-08-05 ENCOUNTER — Ambulatory Visit: Admitting: Family Medicine

## 2024-08-05 ENCOUNTER — Encounter: Payer: Self-pay | Admitting: Family Medicine

## 2024-08-05 VITALS — BP 148/80 | HR 86 | Resp 16 | Ht 63.0 in | Wt 118.4 lb

## 2024-08-05 DIAGNOSIS — I10 Essential (primary) hypertension: Secondary | ICD-10-CM

## 2024-08-05 DIAGNOSIS — M7501 Adhesive capsulitis of right shoulder: Secondary | ICD-10-CM | POA: Diagnosis not present

## 2024-08-05 DIAGNOSIS — J4489 Other specified chronic obstructive pulmonary disease: Secondary | ICD-10-CM | POA: Diagnosis not present

## 2024-08-05 DIAGNOSIS — M810 Age-related osteoporosis without current pathological fracture: Secondary | ICD-10-CM | POA: Diagnosis not present

## 2024-08-05 DIAGNOSIS — F419 Anxiety disorder, unspecified: Secondary | ICD-10-CM

## 2024-08-05 DIAGNOSIS — J432 Centrilobular emphysema: Secondary | ICD-10-CM

## 2024-08-05 DIAGNOSIS — Z85828 Personal history of other malignant neoplasm of skin: Secondary | ICD-10-CM | POA: Diagnosis not present

## 2024-08-05 DIAGNOSIS — M25811 Other specified joint disorders, right shoulder: Secondary | ICD-10-CM | POA: Diagnosis not present

## 2024-08-05 DIAGNOSIS — I2510 Atherosclerotic heart disease of native coronary artery without angina pectoris: Secondary | ICD-10-CM | POA: Diagnosis not present

## 2024-08-05 DIAGNOSIS — S52571D Other intraarticular fracture of lower end of right radius, subsequent encounter for closed fracture with routine healing: Secondary | ICD-10-CM | POA: Diagnosis not present

## 2024-08-05 DIAGNOSIS — Z9889 Other specified postprocedural states: Secondary | ICD-10-CM | POA: Diagnosis not present

## 2024-08-05 DIAGNOSIS — K861 Other chronic pancreatitis: Secondary | ICD-10-CM

## 2024-08-05 DIAGNOSIS — S52501D Unspecified fracture of the lower end of right radius, subsequent encounter for closed fracture with routine healing: Secondary | ICD-10-CM | POA: Diagnosis not present

## 2024-08-05 DIAGNOSIS — I7121 Aneurysm of the ascending aorta, without rupture: Secondary | ICD-10-CM | POA: Diagnosis not present

## 2024-08-05 DIAGNOSIS — F325 Major depressive disorder, single episode, in full remission: Secondary | ICD-10-CM

## 2024-08-05 DIAGNOSIS — I700 Atherosclerosis of aorta: Secondary | ICD-10-CM | POA: Diagnosis not present

## 2024-08-05 MED ORDER — DIAZEPAM 5 MG PO TABS: 5.0000 mg | ORAL_TABLET | Freq: Every day | ORAL | 0 refills | Status: AC | PRN

## 2024-08-05 MED ORDER — LEVOCETIRIZINE DIHYDROCHLORIDE 5 MG PO TABS: 5.0000 mg | ORAL_TABLET | Freq: Every evening | ORAL | 1 refills | Status: AC

## 2024-08-05 MED ORDER — AMLODIPINE BESYLATE 5 MG PO TABS: 5.0000 mg | ORAL_TABLET | Freq: Every day | ORAL | 1 refills | Status: AC

## 2024-08-05 MED ORDER — MONTELUKAST SODIUM 10 MG PO TABS: 10.0000 mg | ORAL_TABLET | Freq: Every day | ORAL | 1 refills | Status: AC

## 2024-08-05 NOTE — Progress Notes (Signed)
 Name: Patricia Galloway   MRN: [id number]    DOB: 08/02/1951   Date:08/05/2024       Progress Note  Subjective  Chief Complaint  Chief Complaint  Patient presents with   Medical Management of Chronic Issues   History of Present Illness Patricia Galloway is a 73 year old female who presents for follow-up on her wrist fracture recovery and other health concerns.  She sustained a right wrist fracture on May 14, 2023, after tripping over a throw rug while avoiding stepping on a cat. She fell, catching herself with her right hand and knee, resulting in intense pain in her arm. An X-ray at the [facility] confirmed a closed fracture. Her arm was immobilized from the wrist to the elbow for two weeks before a cast was applied in [facility]. She has been undergoing occupational therapy and has attended two sessions so far. She is unable to make a fist due to tightness and swelling in her fingers, which persists despite home exercises and therapy. She has a sensitivity to latex, which caused irritation when using sleeves under her cast.  She has a history of osteoporosis diagnosed in 2021 and is currently taking vitamin D  and a supplement called Bone Restore. She admits to inconsistent use of these supplements until her recent fracture. Her vitamin D  level was slightly below normal in May 2025, and she is now taking 5000 IU twice a week. She also takes sublingual B12 twice a week.  She has a history of COPD with asthma and centrilobular emphysema. She has a history of smoking. She is not currently undergoing lung cancer screening due to cost concerns. She uses Breo Ellipta  for her COPD, as Advair was ineffective, and takes montelukast  at night. She experiences shortness of breath, particularly in the morning, and produces mucus. She also takes diazepam  as needed for anxiety, which she finds helps with her breathing.  She has a history of basal cell carcinoma on her leg, with a hypermetabolic area noted in  2022. She saw a engineer, petroleum in 2023  She has not had follow-up imaging or consultations due to financial constraints.  She has a history of coronary artery disease. She uses cayenne pepper in her water, which she reports increases her circulation. She has a history of thrombosis of the aorta and plaque, but no recent echocardiogram or specific follow-up for this condition.  She reports a history of depression, particularly during her husband's illness, but currently denies depressive symptoms. She experiences anxiety, particularly related to financial stress after losing her husband's life insurance to debt garnishment. She lives on social security and is responsible for maintaining her 73 year old home, which she co-owns with her sister. 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 No chest pain or palpitations. She denies symptoms of chronic pancreatitis such as abdominal pain, nausea, or vomiting.    Patient Active Problem List   Diagnosis Date Noted   CAD in native artery 01/31/2024   Anxiety 01/31/2024   Depression, major, in remission 05/11/2022   COPD with asthma (HCC) 05/11/2022   Osteoporosis without current pathological fracture 05/11/2022   Dyslipidemia 05/11/2022   History of basal cell carcinoma (BCC) excision 05/11/2022   Red blood cell antibody positive 08/03/2021   Mass of thigh 06/11/2021   Basal cell carcinoma 03/29/2017   Thoracic aortic aneurysm without rupture 10/31/2016   Atherosclerosis of aorta 10/31/2016   Chronic pancreatitis (HCC) 10/31/2016   Pulmonary nodule 10/31/2016   Centrilobular emphysema (HCC) 10/31/2016   Iron  deficiency anemia due to chronic blood loss 10/24/2016   Basal cell carcinoma, leg, right 08/24/2016   Hyperglycemia 08/01/2016   Chronic thoracic back pain 08/04/2015   Panic attack 08/04/2015   White coat syndrome with diagnosis of hypertension 08/04/2015    Past Surgical History:  Procedure Laterality Date   BASAL CELL CARCINOMA  EXCISION Right 03/29/2017   Procedure: EXCISION OF RIGHT LEG BASEL CELL CARCINOMA;  Surgeon: Lowery Estefana RAMAN, DO;  Location: [facility] OR;  Service: Plastics;  Laterality: Right;   COLON SURGERY  2002   colostomy for 10 months after a perfurated sigmoid    COLOSTOMY REVERSAL  2003   EMBOLIZATION (CATH LAB) Right 10/12/2016   Procedure: Embolization;  Surgeon: Jama Cordella MATSU, MD;  Location: [facility] INVASIVE CV LAB;  Service: Cardiovascular;  Laterality: Right;   etopic  8019,8015   INCISION AND DRAINAGE OF WOUND Right 03/30/2017   Procedure: IRRIGATION AND DEBRIDEMENT WOUND; VAC PLACEMENT;  Surgeon: Lowery Estefana RAMAN, DO;  Location: [facility] ORS;  Service: Plastics;  Laterality: Right;   TONSILLECTOMY      Family History  Problem Relation Age of Onset   Dementia Mother    Aortic stenosis Mother    Osteoporosis Mother    Dementia Father    Diabetes Father    Hyperlipidemia Father    Hypertension Father    Aneurysm Sister    Hyperlipidemia Sister    Hypertension Sister    Diabetes Sister    Breast cancer Neg Hx     Social History   Tobacco Use   Smoking status: Former    Current packs/day: 0.00    Average packs/day: 0.5 packs/day for 25.9 years (13.0 ttl pk-yrs)    Types: Cigarettes    Start date: 09/05/1972    Quit date: 08/03/1998    Years since quitting: 26.0   Smokeless tobacco: Never   Tobacco comments:    smoking cessation materials not required  Substance Use Topics   Alcohol use: No    Alcohol/week: 0.0 standard drinks of alcohol     Current Outpatient Medications:    albuterol  (VENTOLIN  HFA) 108 (90 Base) MCG/ACT inhaler, INHALE 2 PUFFS INTO LUNGS EVERY 6 HOURS AS NEEDED FOR WHEEZING OR SHORTNESS OF BREATH, Disp: 18 g, Rfl: 1   benzonatate  (TESSALON ) 100 MG capsule, Take 1-2 capsules (100-200 mg total) by mouth 3 (three) times daily as needed for cough., Disp: 60 capsule, Rfl: 0   BREO ELLIPTA  200-25 MCG/ACT AEPB, INHALE 1 PUFF INTO THE LUNGS ONCE DAILY AS DIRECTED.,  Disp: 180 each, Rfl: 1   Cholecalciferol (VITAMIN D3) 125 MCG (5000 UT) TABS, Take 1 tablet by mouth 2 (two) times a week., Disp: , Rfl:    Cyanocobalamin (B-12) 500 MCG SUBL, Place 1 each under the tongue 2 (two) times a week., Disp: , Rfl:    magnesium 30 MG tablet, Take 30 mg by mouth daily., Disp: , Rfl:    Multiple Vitamin (MULTIVITAMIN) capsule, Take 2 capsules by mouth daily., Disp: , Rfl:    OVER THE COUNTER MEDICATION, Take 1 tablet by mouth 2 (two) times daily. Nutri-Calm, Disp: , Rfl:    OVER THE COUNTER MEDICATION, Life extension bone strength / life extension skin restore ceramide / life extension super bio curcumin, Disp: , Rfl:    Wound Cleansers (VASHE WOUND) 0.033 % SOLN, Apply 1 each topically daily at 12 noon., Disp: , Rfl:    amLODipine  (NORVASC ) 5 MG tablet, Take 1 tablet (5 mg total) by mouth daily.,  Disp: 90 tablet, Rfl: 1   diazepam  (VALIUM ) 5 MG tablet, Take 1 tablet (5 mg total) by mouth daily as needed for anxiety. 30 minutes before doctor's visit, Disp: 30 tablet, Rfl: 0   levocetirizine (XYZAL ) 5 MG tablet, Take 1 tablet (5 mg total) by mouth every evening., Disp: 90 tablet, Rfl: 1   montelukast  (SINGULAIR ) 10 MG tablet, Take 1 tablet (10 mg total) by mouth at bedtime., Disp: 90 tablet, Rfl: 1  Allergies  Allergen Reactions   Penicillins Anaphylaxis    Has patient had a PCN reaction causing immediate rash, facial/tongue/throat swelling, SOB or lightheadedness with hypotension: Yes Has patient had a PCN reaction causing severe rash involving mucus membranes or skin necrosis: No Has patient had a PCN reaction that required hospitalization: No Has patient had a PCN reaction occurring within the last 10 years: No If all of the above answers are NO, then may proceed with Cephalosporin use.    Codeine Nausea And Vomiting   Latex Itching   Sulfa Antibiotics Other (See Comments)    TOPICAL-SULFA CAUSED BLISTERS   Tape Other (See Comments)    Reaction:  Blisters and  burning  Pt states that paper tape is okay.     Xopenex  [Levalbuterol  Hcl] Anxiety    I personally reviewed active problem list, medication list, allergies, family history with the patient/caregiver today.   ROS  Ten systems reviewed and is negative except as mentioned in HPI    Objective Physical Exam  CONSTITUTIONAL: Patient appears well-developed and well-nourished. No distress. HEENT: Head atraumatic, normocephalic, neck supple. CARDIOVASCULAR: Normal rate, regular rhythm and normal heart sounds. No murmur heard. No BLE edema. PULMONARY: Effort normal. Breath sounds clear with bronchi present anteriorly. No respiratory distress. MUSCULOSKELETAL: weaker right leg due to previous surgery right quad muscle, also decrease rom of right wrist and right hand edema due to recent wrist fracture PSYCHIATRIC: Patient has a normal mood and affect. Behavior is normal. Judgment and thought content normal.  Vitals:   08/05/24 1100 08/05/24 1143  BP: (!) 154/84 (!) 148/80  Pulse: 86   Resp: 16   SpO2: 93%   Weight: 118 lb 6.4 oz (53.7 kg)   Height: 5' 3 (1.6 m)     Body mass index is 20.97 kg/m.   PHQ2/9:    08/05/2024   10:57 AM 01/31/2024   10:32 AM 11/09/2023    3:06 PM 10/25/2023    8:45 AM 08/01/2023   11:34 AM  Depression screen PHQ 2/9  Decreased Interest 0 0 1 0 0  Down, Depressed, Hopeless 0 0 1 0 0  PHQ - 2 Score 0 0 2 0 0  Altered sleeping 0 0 1 0 0  Tired, decreased energy 0 0 1 0 0  Change in appetite 0 0 0 0 0  Feeling bad or failure about yourself  0 0 0 0 0  Trouble concentrating 0 0 0 0 0  Moving slowly or fidgety/restless 0 0 0 0 0  Suicidal thoughts 0 0 0 0 0  PHQ-9 Score 0 0  4  0  0   Difficult doing work/chores Not difficult at all Not difficult at all Not difficult at all Not difficult at all      Data saved with a previous flowsheet row definition    phq 9 is negative  Fall Risk:    08/05/2024   10:57 AM 01/31/2024   10:29 AM 11/09/2023    3:13  PM 11/06/2023   10:10  AM 10/25/2023    8:45 AM  Fall Risk   Falls in the past year? 0 0 0 0 0  Number falls in past yr: 0 0 0  0  Injury with Fall? 0 0 0 0 0  Risk for fall due to : No Fall Risks No Fall Risks No Fall Risks  No Fall Risks  Follow up Falls evaluation completed Education provided;Falls prevention discussed;Falls evaluation completed Falls prevention discussed;Falls evaluation completed  Falls prevention discussed;Education provided;Falls evaluation completed    Assessment & Plan Closed fracture of right distal radius Fracture reduced in ER, no surgery needed. Limited mobility due to swelling and stiffness. - Continue occupational therapy and home exercises. - Avoid latex-containing products due to allergy.  Chronic obstructive pulmonary disease with asthma and centrilobular emphysema COPD with asthma exacerbated by allergens. Current inhaler Breo Ellipta , considering switch to Symbicort for morning symptoms. - Continue Breo Ellipta . - Consider Symbicort for morning symptoms; check insurance coverage. - Continue montelukast . - Use levocetirizine for allergies. - Use Tessalon  Perles as needed for cough.  Essential hypertension with white coat component  Hypertension managed with amlodipine . Elevated readings possibly due to anxiety. - Continue amlodipine  5 mg daily. - Prescribed diazepam  30 tablets for anxiety-related hypertension.  Age-related osteoporosis Osteoporosis with slightly low vitamin D  levels. Pharmacological treatment includes vitamin D  supplementation. - Continue vitamin D , consider increasing to 5000 IU three times a week. - Continue Bone Restore supplement.  Atherosclerotic heart disease of native coronary artery Coronary artery disease, prefers non-pharmacological management. - Check cholesterol levels in six months.  Chronic pancreatitis -on imaging, no symptoms   Atherosclerosis of aorta Aortic atherosclerosis noted, no symptoms or intervention  needed. - Monitor for symptoms, consider echocardiogram if symptoms develop.  History of basal cell carcinoma of right thigh Previous surgical intervention, no current follow-up due to financial constraints. - Recommended follow-up with oncology or plastic surgery since last scans were done in 2022  Latex allergy Latex allergy causing skin irritation. - Avoid latex-containing products.  Anxiety disorder Anxiety managed with diazepam , contributes to elevated blood pressure. - Prescribed diazepam  30 tablets for anxiety management.  Depression in full remission Depression in remission, PHQ-9 score zero. - Continue monitoring for symptoms.

## 2024-08-06 ENCOUNTER — Other Ambulatory Visit (HOSPITAL_COMMUNITY): Payer: Self-pay

## 2024-08-06 ENCOUNTER — Telehealth: Payer: Self-pay | Admitting: Pharmacy Technician

## 2024-08-06 NOTE — Telephone Encounter (Signed)
 Pharmacy Patient Advocate Encounter  Received notification from [employer] that Prior Authorization for diazePAM  5MG  tablets has been APPROVED from 08/06/24 to 09/05/24. Ran test claim, Copay is $0.41. This test claim was processed through [facility]- copay amounts may vary at other pharmacies due to pharmacy/plan contracts, or as the patient moves through the different stages of their insurance plan.   PA #/Case ID/Reference #: [id number]

## 2024-08-06 NOTE — Telephone Encounter (Signed)
 Pharmacy Patient Advocate Encounter   Received notification from CoverMyMeds that prior authorization for diazePAM  5MG  tablets is required/requested.   Insurance verification completed.   The patient is insured through [facility].   Per test claim: PA required; PA started via CoverMyMeds. KEY [id number] . Waiting for clinical questions to populate.

## 2024-08-22 DIAGNOSIS — M25611 Stiffness of right shoulder, not elsewhere classified: Secondary | ICD-10-CM | POA: Diagnosis not present

## 2024-08-22 DIAGNOSIS — S52571D Other intraarticular fracture of lower end of right radius, subsequent encounter for closed fracture with routine healing: Secondary | ICD-10-CM | POA: Diagnosis not present

## 2024-08-30 ENCOUNTER — Other Ambulatory Visit: Payer: Self-pay | Admitting: Family Medicine

## 2024-08-30 DIAGNOSIS — J4540 Moderate persistent asthma, uncomplicated: Secondary | ICD-10-CM

## 2024-08-30 DIAGNOSIS — J432 Centrilobular emphysema: Secondary | ICD-10-CM

## 2024-09-02 NOTE — Telephone Encounter (Signed)
 Requested Prescriptions  Pending Prescriptions Disp Refills   albuterol  (VENTOLIN  HFA) 108 (90 Base) MCG/ACT inhaler [Pharmacy Med Name: ALBUTEROL  SULFATE HFA 108 (90 BASE)] 18 g 2    Sig: INHALE 2 PUFFS INTO LUNGS EVERY 6 HOURS AS NEEDED FOR WHEEZING OR SHORTNESS OF BREATH     Pulmonology:  Beta Agonists 2 Failed - 09/02/2024  1:48 PM      Failed - Last BP in normal range    BP Readings from Last 1 Encounters:  08/05/24 (!) 148/80         Passed - Last Heart Rate in normal range    Pulse Readings from Last 1 Encounters:  08/05/24 86         Passed - Valid encounter within last 12 months    Recent Outpatient Visits           4 weeks ago COPD with asthma [facility])   [facility] Glenard Mire, MD   7 months ago COPD with asthma [facility])   [facility] Glenard Mire, MD   10 months ago Acute URI   [facility] Sowles, Krichna, MD

## 2024-11-14 ENCOUNTER — Ambulatory Visit

## 2025-02-03 ENCOUNTER — Ambulatory Visit: Admitting: Family Medicine
